# Patient Record
Sex: Female | Born: 1950 | Race: White | Hispanic: No | Marital: Married | Smoking: Former smoker
Health system: Southern US, Community
[De-identification: ages and names within clinical notes are randomized; demographics above are authoritative.]

## PROBLEM LIST (undated history)

## (undated) DIAGNOSIS — N393 Stress incontinence (female) (male): Secondary | ICD-10-CM

## (undated) DIAGNOSIS — I209 Angina pectoris, unspecified: Secondary | ICD-10-CM

## (undated) DIAGNOSIS — F419 Anxiety disorder, unspecified: Secondary | ICD-10-CM

## (undated) DIAGNOSIS — K5903 Drug induced constipation: Secondary | ICD-10-CM

## (undated) DIAGNOSIS — K219 Gastro-esophageal reflux disease without esophagitis: Secondary | ICD-10-CM

## (undated) DIAGNOSIS — T402X5A Adverse effect of other opioids, initial encounter: Secondary | ICD-10-CM

## (undated) DIAGNOSIS — C801 Malignant (primary) neoplasm, unspecified: Secondary | ICD-10-CM

## (undated) DIAGNOSIS — M419 Scoliosis, unspecified: Secondary | ICD-10-CM

## (undated) DIAGNOSIS — T8859XA Other complications of anesthesia, initial encounter: Secondary | ICD-10-CM

## (undated) DIAGNOSIS — M8580 Other specified disorders of bone density and structure, unspecified site: Secondary | ICD-10-CM

## (undated) DIAGNOSIS — J189 Pneumonia, unspecified organism: Secondary | ICD-10-CM

## (undated) DIAGNOSIS — M169 Osteoarthritis of hip, unspecified: Secondary | ICD-10-CM

## (undated) DIAGNOSIS — R55 Syncope and collapse: Secondary | ICD-10-CM

## (undated) DIAGNOSIS — R519 Headache, unspecified: Secondary | ICD-10-CM

## (undated) DIAGNOSIS — T4145XA Adverse effect of unspecified anesthetic, initial encounter: Secondary | ICD-10-CM

## (undated) DIAGNOSIS — G2581 Restless legs syndrome: Secondary | ICD-10-CM

## (undated) HISTORY — PX: TONSILLECTOMY: SUR1361

## (undated) HISTORY — PX: BACK SURGERY: SHX140

## (undated) HISTORY — PX: EYE SURGERY: SHX253

## (undated) HISTORY — PX: TUBAL LIGATION: SHX77

## (undated) HISTORY — PX: FOOT SURGERY: SHX648

## (undated) HISTORY — PX: BREAST SURGERY: SHX581

## (undated) HISTORY — PX: HYSTEROTOMY: SHX1776

## (undated) HISTORY — PX: ROTATOR CUFF REPAIR: SHX139

---
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
## 1986-10-08 HISTORY — PX: BREAST CYST ASPIRATION: SHX578

## 1988-10-08 HISTORY — PX: BREAST EXCISIONAL BIOPSY: SUR124

## 2004-07-27 ENCOUNTER — Ambulatory Visit: Payer: Self-pay

## 2004-10-08 HISTORY — PX: LAMINECTOMY: SHX219

## 2005-01-09 ENCOUNTER — Ambulatory Visit: Payer: Self-pay | Admitting: General Surgery

## 2005-02-06 ENCOUNTER — Ambulatory Visit: Payer: Self-pay | Admitting: Psychiatry

## 2005-02-25 ENCOUNTER — Other Ambulatory Visit: Payer: Self-pay

## 2005-02-25 ENCOUNTER — Emergency Department: Payer: Self-pay | Admitting: Emergency Medicine

## 2005-11-06 ENCOUNTER — Ambulatory Visit: Payer: Self-pay | Admitting: Pain Medicine

## 2006-01-10 ENCOUNTER — Ambulatory Visit: Payer: Self-pay | Admitting: General Surgery

## 2006-03-02 ENCOUNTER — Ambulatory Visit: Payer: Self-pay | Admitting: Psychiatry

## 2006-08-28 ENCOUNTER — Ambulatory Visit (HOSPITAL_COMMUNITY): Admission: RE | Admit: 2006-08-28 | Discharge: 2006-08-28 | Payer: Self-pay | Admitting: *Deleted

## 2007-06-18 ENCOUNTER — Encounter: Payer: Self-pay | Admitting: Obstetrics and Gynecology

## 2007-07-09 ENCOUNTER — Encounter: Payer: Self-pay | Admitting: Obstetrics and Gynecology

## 2007-08-24 ENCOUNTER — Encounter: Admission: RE | Admit: 2007-08-24 | Discharge: 2007-08-24 | Payer: Self-pay | Admitting: Orthopedic Surgery

## 2007-09-19 ENCOUNTER — Inpatient Hospital Stay (HOSPITAL_COMMUNITY): Admission: RE | Admit: 2007-09-19 | Discharge: 2007-09-20 | Payer: Self-pay | Admitting: Orthopedic Surgery

## 2008-07-14 LAB — HM PAP SMEAR: HM Pap smear: NEGATIVE

## 2008-07-28 ENCOUNTER — Ambulatory Visit: Payer: Self-pay | Admitting: Family Medicine

## 2008-10-04 ENCOUNTER — Ambulatory Visit: Payer: Self-pay | Admitting: Family Medicine

## 2008-11-24 ENCOUNTER — Ambulatory Visit: Payer: Self-pay | Admitting: Gastroenterology

## 2009-07-08 ENCOUNTER — Ambulatory Visit: Payer: Self-pay | Admitting: Family Medicine

## 2009-08-03 ENCOUNTER — Ambulatory Visit: Payer: Self-pay | Admitting: Gastroenterology

## 2010-06-16 ENCOUNTER — Ambulatory Visit: Payer: Self-pay | Admitting: Obstetrics and Gynecology

## 2010-06-20 ENCOUNTER — Ambulatory Visit: Payer: Self-pay | Admitting: Obstetrics and Gynecology

## 2011-02-20 NOTE — Op Note (Signed)
Elizabeth [name], Elizabeth [name]  ACCOUNT NO.:  [id number]   MEDICAL RECORD NO.:  [id number]          PATIENT TYPE:  OIB   LOCATION:  0098                         FACILITY:  [facility]   PHYSICIAN:  Marlowe Kays, M.D.  DATE OF BIRTH:  [birth year]   DATE OF PROCEDURE:  09/18/2007  DATE OF DISCHARGE:                               OPERATIVE REPORT   PREOPERATIVE DIAGNOSES:  1. Lumbar scoliosis.  2. Spinal stenosis primarily at L3-4 secondary to #1.  3. Facet degenerative arthritis.  4. Ligamentum flavum hypertrophy.   POSTOPERATIVE DIAGNOSES:  1. Lumbar scoliosis.  2. Spinal stenosis primarily at L3-4 secondary to #1.  3. Facet degenerative arthritis.  4. Ligamentum flavum hypertrophy.   OPERATION:  Central foraminal decompression L3-4 and L4-5.   SURGEON:  Marlowe Kays, M.D.   ASSISTANT:  Georges Lynch. Darrelyn Hillock, M.D.   ANESTHESIA:  General anesthesia, Burnett Corrente, M.D.   JUSTIFICATION FOR PROCEDURE:  She had symptoms of spinal stenosis with  bilateral pain and numbness.  An MRI performed on August 24, 2007,  showed facet degenerative changes at multiple levels with the only  operable area being at L3-4 extending somewhat proximal and somewhat  distal.  See operative description below for additional details.   DESCRIPTION OF PROCEDURE:  Prophylactic antibiotics, general anesthesia,  knee-chest position on the Andrews frame, back was prepped with DuraPrep  and with two spinal needles and while x-ray obtained, we located the L3-  4 interspace.  I then continued draping the back into sterile field.  Ioban employed.  Vertical midline incision based on the initial  incision.  The two spinous processes in the depths of the wound were  tagged with Kocher clamps and a second lateral x-ray demonstrated these  as being on L2 and L3. Based on the preoperative MRI, there appeared to  be stenosis somewhat proximal and somewhat distal to the L3-4 space.  Accordingly, I extended my  incision caudad and we exposed the neural  arches of lower part of L2 and completely of L3 and L4.  Self-retaining  McCullough retractors were placed.  With double action rongeurs, I  removed the spinous process of L3, a portion of L2 and a portion of L4.  I eventually removed the entire spinous process and along with this,  removed most of the neural arch of L3, L4, slightly inferior portion of  L2.  I then began completing the decompression with 2 and 3 mm Kerrison  rongeurs and double action rongeur, eventually bringing in the  microscope to perform the more detailed parts of the decompression.  We  decompressed thoroughly both cephalad and caudad until the central canal  was widely patent to  hockey stick.  A final localizing x-ray indicated  that we worked up to the mid body of L3 and the mid body of L5 distally  for a two level decompression to achieve the desired decompression.  Foramina were checked with a hockey stick.  Wound was irrigated with  sterile saline.  Gelfoam was placed and thrombin was placed over the  dura.  Self-retaining retractors were removed.  There was no unusual  bleeding.  I then closed the wound in layers  with interrupted #1 Vicryl  in the fascia and deep muscle as well as the deep subcutaneous tissue, 2-  0 Vicryl superficial subcutaneous tissue, staples in the skin, Betadine,  Adaptic, dry sterile dressing were applied.  She was gently placed in  PACU bed and taken in satisfactory condition with no known  complications.   ESTIMATED BLOOD LOSS:  No more than 200 mL.   BLOOD REPLACED:  None.           ______________________________  Marlowe Kays, M.D.     JA/MEDQ  D:  09/18/2007  T:  09/19/2007  Job:  [id number]

## 2011-02-20 NOTE — Consult Note (Signed)
[name], [name]  ACCOUNT NO.:  [id number]   MEDICAL RECORD NO.:  [id number]          PATIENT TYPE:  INP   LOCATION:  1237                         FACILITY:  [facility]   PHYSICIAN:  Elmore Guise., M.D.DATE OF BIRTH:  May 11, 1951   DATE OF CONSULTATION:  09/19/2007  DATE OF DISCHARGE:                                 CONSULTATION   REASON FOR CONSULTATION:  Chest pain.   HISTORY OF PRESENT ILLNESS:  Ms. Elizabeth [name] is a very pleasant 60-  year-old white female with past medical history of recurrent chest pain  (status post cardiac catheterization November 2007 which showed no  significant obstructive disease but mild vasospasm of the right coronary  artery, this resolved with intracoronary nitroglycerin),  gastroesophageal reflux disease, migraine headaches and palpitations who  was admitted for back surgery.  She underwent lumbar decompression  procedure yesterday.  She tolerated the procedure well.  Early this  morning the patient awoke with chest heaviness and shortness of breath.  She was given nitroglycerin with resolution.  Currently she was without  complaint except feeling achy in her lower back.  She denies any current  shortness of breath.  No orthopnea.  No PND and no palpitations.  She  has been using her morphine PCA pump by her report every 4 hours with  what seems to be pretty good pain relief.  Prior to her procedure she  had not used any nitroglycerin since September of this year.  Normally  she is pretty active at home without any significant problems.   HOME MEDICATIONS:  1. Home medications include Nexium 40 mg daily.  2. Aspirin 325 mg daily.  3. Klonopin 2 mg daily at bedtime.  4. Nitroglycerin p.r.n.  5. Ranexa p.r.n.  6. Mirapex.   HOSPITAL MEDICATIONS:  1. Lexapro.  2. Aspirin.  3. Topamax.  4. Xanax.  5. Morphine.  6. Protonix.   ALLERGIES:  PENICILLIN and NAPROSYN.   FAMILY HISTORY:  Positive for stroke and lung  cancer.   SOCIAL HISTORY:  She is married.  No current tobacco use.  Quit over 10  years ago.  Drinks wine socially.   PHYSICAL EXAMINATION:  VITAL SIGNS:  She is afebrile.  Blood pressure is  100/50.  Heart rate 70's showing sinus rhythm.  Sat 98% on 2 liters  nasal cannula.  GENERAL:  In general she is a very pleasant middle-aged white female  alert and oriented x4 in no acute distress.  HEENT:  Appear normal.  NECK:  Supple.  No lymphadenopathy.  Carotids 2+.  No JVD.  No bruits.  LUNGS:  Clear.  HEART:  Regular with no significant murmurs, gallops or rubs.  ABDOMEN:  Soft, nontender, nondistended.  No rebound or guarding.  EXTREMITIES:  Warm with 2+ pulses and no significant edema.  She does  have her SCDs on.   LAB:  Her lab work shows a CPK of 354 and 356, an MB of 3.8 and 3.0 and  troponin I of 0.06 and 0.04.  Her last hemoglobin done yesterday was  13.3 and her EKG #1 showed sinus bradycardia, rate of 50 per minute with  no ST or T wave  changes.  EKG #2 showed normal sinus rhythm, 78 per  minute with no significant ST or T wave changes.  These had no  significant changes from her prior tracing done in the office.   IMPRESSION:  1. Chest pain with history of nonobstructed coronary arteries and      vasospasm noted.  The patient is currently chest pain-free.  2. Status post lumbar decompression surgery.  3. Gastroesophageal reflux disease.   PLAN:  1. At this time I agree with her current management.  I will continue      aspirin 325 mg daily.  She is currently chest pain-free and her      enzymes and EKGs are negative.  I would use Toradol as needed for      her pain; if this does not improve her symptoms then p.r.n.      nitroglycerin.  Will continue telemetry monitoring for 24 hours and      if no significant problems today I do feel like she would likely be      stable for discharge in the morning from a cardiology standpoint.      I will alert my partner who will  round over the weekend.  Please      call if any further cardiac questions arise.      Elmore Guise., M.D.  Electronically Signed     TWK/MEDQ  D:  09/19/2007  T:  09/19/2007  Job:  [id number]

## 2011-02-23 NOTE — Cardiovascular Report (Signed)
NAMECARMITA, BOOM          ACCOUNT NO.:  [id number]   MEDICAL RECORD NO.:  [id number]          PATIENT TYPE:  OIB   LOCATION:  2899                         FACILITY:  [facility]   PHYSICIAN:  Elmore Guise., M.D.DATE OF BIRTH:  April 02, 1951   DATE OF PROCEDURE:  08/28/2006  DATE OF DISCHARGE:                            CARDIAC CATHETERIZATION   INDICATIONS FOR PROCEDURE:  A 60 year old white female with recent ER  evaluation for chest pain and mild inferior ECG changes.   DESCRIPTION OF PROCEDURE:  The patient was brought to the cardiac  catheterization lab after appropriate full consent.  She was prepped and  draped in sterile fashion.  Approximately 15 mL of 1% lidocaine was used  for local anesthesia.  A 6-French sheath was placed in the right femoral  artery without difficulty.  Coronary angiography, LV angiography,  limited right femoral angiography were then performed.  Angio-Seal  closure device was deployed successfully.  The patient tolerated the  procedure well was transferred from the cardiac catheterization lab in  stable condition.   FINDINGS:  1. Left Main:  Short and normal appearing  2. LAD:  Mild luminal irregularities.  3. Diagonal-1:  Small vessel, mild luminal irregularities.  4. Left circumflex:  Nondominant, mild luminal irregularities.  5. OM-1:  Small to moderate-size vessel with mild luminal      irregularities.  6. Ramus intermedius:  Small vessel with mild luminal irregularities.  7. RCA is a small to moderate-size dominant vessel with mild luminal      irregularities. Proximal vasospasm noted on engagement with the      catheter. Catheter was changed out for a 4-French JR-4; 200 mcg of      intracoronary nitroglycerin was given with resolution of her      vasospasm.  8. LV:  EF is 55-60%.  No wall motion abnormalities.  LVEDP is 14      mmHg.  9. Limited right femoral angiogram was performed which showed no      significant atherosclerotic  disease and normal appropriate      arteriotomy site for Angio-Seal deployment.  AngioSeal was deployed      and successful.   IMPRESSION:  1. Nonobstructive coronary arteries as described.  2. Mild proximal vasospasm of her right coronary artery (resolved with      intracoronary nitroglycerin).  3. Normal left ventricular systolic function with an ejection fraction      of 55-60%.   PLAN:  At the current time, we will continue to treat with aggressive  risk factor modification.  I did discuss complete smoking cessation with  her at length.  We will continue her long-acting nitroglycerin for spasm  component.  The patient will follow up in approximately 2 weeks.     Elmore Guise., M.D.  Electronically Signed    TWK/MEDQ  D:  08/28/2006  T:  08/28/2006  Job:  [id number]

## 2011-02-23 NOTE — H&P (Signed)
Elizabeth [name], Elizabeth [name]  ACCOUNT NO.:  [id number]   MEDICAL RECORD NO.:  [id number]           PATIENT TYPE:   LOCATION:                                 FACILITY:   PHYSICIAN:  Elmore Guise., M.D.DATE OF BIRTH:  12/06/1950   DATE OF ADMISSION:  08/28/2006  DATE OF DISCHARGE:                              HISTORY & PHYSICAL   INDICATION FOR EVALUATION:  Increased exertional chest pain with ER  admission last week.   HISTORY OF PRESENT ILLNESS:  Elizabeth [name] is a very pleasant 60-  year-old white female with past medical history of premenstrual  syndrome, gastroesophageal reflux, migraine headaches, and syncope who  presents for evaluation of chest pain. The patient reports episode of  chest pain [place]. She stated it  started as a pressure in her substernal area radiating to her neck  associated with some shortness of breath. EMS was notified. She was  taken to the local emergency department in [place]. There she had a  normal EKG with some nonspecific inferior ST/T wave flattening. She was  treated with nitroglycerin. Had one set of cardiac enzymes performed  which were negative. Since that time she has had an occasional episode  of twinge. Seems to happen when she is up and active. She has also  experienced some orthopnea which is unusual for her. She denies any  cough, no significant shortness of breath. She does report one other  episode of angina this week.   PAST CARDIAC HISTORY:  Evaluation for vasovagal syncope. Her workup at  that time included an echocardiogram which showed an EF of 55-60% with  no wall motion abnormalities, normal diastolic function, and no  significant valvular heart disease. She has also had a stress Cardiolite  dated Mar 06, 2005 where she exercised via Bruce protocol for 9 minutes  and 30 seconds achieving a peak heart rate of 160 beats per minute  corresponding to 95% of age predicted  maximum. She had no EKG changes  and normal perfusion. She had normal LV function with her stress test  with an EF calculated at 75%.   REVIEW OF SYSTEMS:  Negative. She did have difficulty with her Imdur and  unable to tolerate this because of headaches.   CURRENT MEDICATIONS:  1. Nexium 40 mg daily.  2. Aspirin 325 mg daily.  3. Multivitamin once daily.  4. Klonopin 0.5 mg in the morning and 1 mg in the evening.  5. Nitroglycerin p.r.n.  6. Lexapro 10 mg daily.  7. Maxalt 10 mg p.r.n.  8. MiraLax once daily.   ALLERGIES:  PENICILLIN, NAPROSYN.   FAMILY HISTORY:  Positive for stroke and prostate cancer in her father,  lung cancer in her mother.   SOCIAL HISTORY:  She is married. She does work at the [employer]. She  used to exercise for 30-40 minutes at a time. However, has been unable  to do this the last couple of weeks. She has approximately 30 pack-year  history of tobacco. Had quit for almost 10 years. However, now is back  smoking again.   PAST SURGICAL HISTORY:  Appendectomy, tonsillectomy, two  miscarriages  with D&Cs, bilateral rotator cuff surgery, tubal ligation, and right  breast surgery.   PHYSICAL EXAMINATION:  VITAL SIGNS:  Weight 181 pounds, blood pressure  110/80, heart rate 60 and regular.  GENERAL:  She is a very pleasant middle-aged white female alert and  oriented x4. No acute distress.  HEENT: Appeared normal.  NECK:  Supple. No lymphadenopathy, 2+ carotids. No JVD, no bruits.  LUNGS:  Clear.  HEART:  Regular with a normal S1, S2. No significant murmur, gallops or  rubs.  ABDOMEN:  Soft, nontender, nondistended. No rebound or guarding.  EXTREMITIES:  Femoral pulses 2+ with 2+ pedal pulses noted. No  significant edema.   LABORATORY DATA:  Her EKG from [facility] showed normal sinus  rhythm, normal axis, normal intervals with nonspecific inferior ST-T  wave changes which are new compared to her EKG done at her stress test  dated May30,  2006.   Her blood work at that time showed sodium 140, potassium  3.8, BUN and  creatinine of 13 and 0.7, normal LFTs. She had a CPK 43 and MB of less  than 1.2. Troponin I less than 0.04. White count was 10.6, hemoglobin  13.1, platelet count 346.   Her chest x-ray showed no acute cardiopulmonary disease, and her O2 sat  today during the office visit was 97%.   IMPRESSION:  1. Exertional chest pain.  2. Nonspecific inferior ST-T wave changes.  3. Resumption of tobacco dependence.   PLAN:  At current time I discussed with her symptoms and nonspecific  inferior ST changes. I would recommend invasive evaluation. She will be  set up for a cardiac catheterization with possible intervention. I do  want to put her on Ranexa 500 mg once daily. She is to use nitroglycerin  on a p.r.n. basis. I did discuss catheterization with her, and she  understands the risks and benefits. Her husband has undergone two of  these prior. She will continue her aspirin. We will check a D-dimer  today as well as a PT/INR. Her procedure is scheduled for August 28, 2006.      Elmore Guise., M.D.  Electronically Signed     TWK/MEDQ  D:  08/26/2006  T:  08/26/2006  Job:  [id number]

## 2011-07-16 LAB — CARDIAC PANEL(CRET KIN+CKTOT+MB+TROPI)
CK, MB: 3
Relative Index: 1.1
Total CK: 356 — ABNORMAL HIGH
Troponin I: 0.06

## 2011-07-16 LAB — HEMOGLOBIN AND HEMATOCRIT, BLOOD: HCT: 38.3

## 2011-09-19 ENCOUNTER — Ambulatory Visit: Payer: Self-pay | Admitting: Family Medicine

## 2012-09-24 ENCOUNTER — Ambulatory Visit: Payer: Self-pay | Admitting: Obstetrics and Gynecology

## 2013-05-05 LAB — HM COLONOSCOPY

## 2013-05-29 ENCOUNTER — Ambulatory Visit: Payer: Self-pay | Admitting: Adult Health

## 2013-08-27 ENCOUNTER — Ambulatory Visit: Payer: Self-pay | Admitting: Family Medicine

## 2013-09-25 ENCOUNTER — Ambulatory Visit: Payer: Self-pay | Admitting: Obstetrics and Gynecology

## 2013-10-05 DIAGNOSIS — M25512 Pain in left shoulder: Secondary | ICD-10-CM | POA: Insufficient documentation

## 2014-03-26 ENCOUNTER — Emergency Department: Payer: Self-pay | Admitting: Emergency Medicine

## 2014-04-08 ENCOUNTER — Ambulatory Visit: Payer: Self-pay | Admitting: Family Medicine

## 2014-08-30 ENCOUNTER — Ambulatory Visit: Payer: Self-pay | Admitting: Obstetrics and Gynecology

## 2014-09-27 ENCOUNTER — Ambulatory Visit: Payer: Self-pay | Admitting: Obstetrics and Gynecology

## 2014-09-27 LAB — HM MAMMOGRAPHY

## 2014-09-28 ENCOUNTER — Emergency Department: Payer: Self-pay | Admitting: Emergency Medicine

## 2014-09-28 LAB — LIPID PANEL
CHOLESTEROL: 202 mg/dL — AB (ref 0–200)
HDL: 62 mg/dL (ref 35–70)
LDL CALC: 109 mg/dL
Triglycerides: 155 mg/dL (ref 40–160)

## 2014-09-28 LAB — BASIC METABOLIC PANEL
BUN: 11 mg/dL (ref 4–21)
CREATININE: 0.7 mg/dL (ref 0.5–1.1)
GLUCOSE: 91 mg/dL
SODIUM: 141 mmol/L (ref 137–147)

## 2014-09-28 LAB — CBC AND DIFFERENTIAL: WBC: 8.6 10*3/mL

## 2014-09-28 LAB — TSH: TSH: 1.8 u[IU]/mL (ref 0.41–5.90)

## 2014-09-28 LAB — HEPATIC FUNCTION PANEL
ALT: 15 U/L (ref 7–35)
AST: 17 U/L (ref 13–35)

## 2014-12-20 NOTE — H&P (Signed)
TOTAL HIP ADMISSION H&P  Patient is admitted for right total hip arthroplasty.  Subjective:  Chief Complaint: right hip pain  HPI: Elizabeth [name], 64 y.o. female, has a history of pain and functional disability in the right hip(s) due to arthritis and patient has failed non-surgical conservative treatments for greater than 12 weeks to include NSAID's and/or analgesics, corticosteriod injections and activity modification.  Onset of symptoms was gradual starting 5 years ago with gradually worsening course since that time.The patient noted no past surgery on the right hip(s).  Patient currently rates pain in the right hip at 8 out of 10 with activity. Patient has night pain, worsening of pain with activity and weight bearing, pain that interfers with activities of daily living, pain with passive range of motion and crepitus. Patient has evidence of joint space narrowing by imaging studies. This condition presents safety issues increasing the risk of falls.  There is no current active infection.  There are no active problems to display for this patient.  No past medical history on file.  No past surgical history on file.  No prescriptions prior to admission   Allergies not on file  History  Substance Use Topics  . Smoking status: Not on file  . Smokeless tobacco: Not on file  . Alcohol Use: Not on file    No family history on file.   Review of Systems  Constitutional: Negative for fever and chills.  HENT: Negative for ear pain and sore throat.   Eyes: Negative for blurred vision and double vision.  Respiratory: Negative for cough and wheezing.   Cardiovascular: Negative for chest pain and palpitations.  Gastrointestinal: Negative for nausea and vomiting.  Musculoskeletal: Positive for joint pain (right hip).  Skin: Negative for rash.  Neurological: Negative for dizziness and seizures.  Psychiatric/Behavioral: Negative for depression and suicidal ideas.     Objective:  Physical Exam  Constitutional: She is oriented to person, place, and time. She appears well-developed and well-nourished.  HENT:  Head: Normocephalic and atraumatic.  Eyes: Conjunctivae and EOM are normal. Pupils are equal, round, and reactive to light.  Neck: Normal range of motion. Neck supple.  Cardiovascular: Normal rate and intact distal pulses.   Respiratory: Effort normal and breath sounds normal.  GI: Soft. Bowel sounds are normal.  Musculoskeletal: She exhibits tenderness (right hip).  Decreased ROM by 50% due to pain  Neurological: She is alert and oriented to person, place, and time.  Skin: Skin is warm and dry.  Psychiatric: She has a normal mood and affect. Her behavior is normal. Judgment and thought content normal.    Vital signs in last 24 hours:    Labs:   There is no height or weight on file to calculate BMI.   Imaging Review Plain radiographs demonstrate severe degenerative joint disease of the right hip(s). The bone quality appears to be fair for age and reported activity level.  Assessment/Plan:  End stage arthritis, right hip(s)  The patient history, physical examination, clinical judgement of the provider and imaging studies are consistent with end stage degenerative joint disease of the right hip(s) and total hip arthroplasty is deemed medically necessary. The treatment options including medical management, injection therapy, arthroscopy and arthroplasty were discussed at length. The risks and benefits of total hip arthroplasty were presented and reviewed. The risks due to aseptic loosening, infection, stiffness, dislocation/subluxation,  thromboembolic complications and other imponderables were discussed.  The patient acknowledged the explanation, agreed to proceed with the plan and consent  was signed. Patient is being admitted for inpatient treatment for surgery, pain control, PT, OT, prophylactic antibiotics, VTE prophylaxis, progressive  ambulation and ADL's and discharge planning.The patient is planning to be discharged home with home health services

## 2015-01-07 ENCOUNTER — Encounter (HOSPITAL_COMMUNITY)
Admission: RE | Admit: 2015-01-07 | Discharge: 2015-01-07 | Disposition: A | Discharge status: Home / Self Care | Payer: Federal, State, Local not specified - PPO | Source: Ambulatory Visit | Attending: Orthopedic Surgery | Admitting: Orthopedic Surgery

## 2015-01-07 ENCOUNTER — Other Ambulatory Visit (HOSPITAL_COMMUNITY): Payer: Self-pay | Admitting: *Deleted

## 2015-01-07 ENCOUNTER — Encounter (HOSPITAL_COMMUNITY): Payer: Self-pay

## 2015-01-07 DIAGNOSIS — Z01812 Encounter for preprocedural laboratory examination: Secondary | ICD-10-CM | POA: Diagnosis present

## 2015-01-07 HISTORY — DX: Drug induced constipation: K59.03

## 2015-01-07 HISTORY — DX: Gastro-esophageal reflux disease without esophagitis: K21.9

## 2015-01-07 HISTORY — DX: Other specified disorders of bone density and structure, unspecified site: M85.80

## 2015-01-07 HISTORY — DX: Adverse effect of other opioids, initial encounter: T40.2X5A

## 2015-01-07 HISTORY — DX: Osteoarthritis of hip, unspecified: M16.9

## 2015-01-07 HISTORY — DX: Stress incontinence (female) (male): N39.3

## 2015-01-07 HISTORY — DX: Syncope and collapse: R55

## 2015-01-07 HISTORY — DX: Restless legs syndrome: G25.81

## 2015-01-07 LAB — BASIC METABOLIC PANEL
ANION GAP: 4 — AB (ref 5–15)
BUN: 11 mg/dL (ref 6–23)
CALCIUM: 9.4 mg/dL (ref 8.4–10.5)
CO2: 26 mmol/L (ref 19–32)
CREATININE: 0.65 mg/dL (ref 0.50–1.10)
Chloride: 108 mmol/L (ref 96–112)
Glucose, Bld: 71 mg/dL (ref 70–99)
Potassium: 4.1 mmol/L (ref 3.5–5.1)
Sodium: 138 mmol/L (ref 135–145)

## 2015-01-07 LAB — ABO/RH: ABO/RH(D): O POS

## 2015-01-07 LAB — CBC
HCT: 43.8 % (ref 36.0–46.0)
Hemoglobin: 14.2 g/dL (ref 12.0–15.0)
MCH: 29.8 pg (ref 26.0–34.0)
MCHC: 32.4 g/dL (ref 30.0–36.0)
MCV: 91.8 fL (ref 78.0–100.0)
Platelets: 276 10*3/uL (ref 150–400)
RBC: 4.77 MIL/uL (ref 3.87–5.11)
RDW: 13.8 % (ref 11.5–15.5)
WBC: 8.2 10*3/uL (ref 4.0–10.5)

## 2015-01-07 LAB — URINALYSIS, ROUTINE W REFLEX MICROSCOPIC
BILIRUBIN URINE: NEGATIVE
Glucose, UA: NEGATIVE mg/dL
HGB URINE DIPSTICK: NEGATIVE
Ketones, ur: NEGATIVE mg/dL
LEUKOCYTES UA: NEGATIVE
NITRITE: NEGATIVE
Protein, ur: NEGATIVE mg/dL
SPECIFIC GRAVITY, URINE: 1.022 (ref 1.005–1.030)
Urobilinogen, UA: 1 mg/dL (ref 0.0–1.0)
pH: 5.5 (ref 5.0–8.0)

## 2015-01-07 LAB — SURGICAL PCR SCREEN
MRSA, PCR: NEGATIVE
STAPHYLOCOCCUS AUREUS: NEGATIVE

## 2015-01-07 LAB — TYPE AND SCREEN
ABO/RH(D): O POS
ANTIBODY SCREEN: NEGATIVE

## 2015-01-07 LAB — PROTIME-INR
INR: 0.96 (ref 0.00–1.49)
Prothrombin Time: 12.8 seconds (ref 11.6–15.2)

## 2015-01-07 NOTE — Pre-Procedure Instructions (Signed)
Elizabeth [name]  01/07/2015   Your procedure is scheduled on:  Tuesday, January 18, 2015 at 12:00 PM.   Report to [facility] Entrance "A" Admitting Office at 10:00 AM.   Call this number if you have problems the morning of surgery: [phone]               Any questions prior to day of surgery, please call [phone] between 8 & 4 PM.   Remember:   Do not eat food or drink liquids after midnight Monday, 01/17/15.   Take these medicines the morning of surgery with A SIP OF WATER: Omeprazole (Prilosec), Hydrocodone (Vicodin) - if needed.  Stop Vitamins 7 days prior to surgery.   Do not wear jewelry, make-up or nail polish.  Do not wear lotions, powders, or perfumes. You may wear deodorant.  Do not shave 48 hours prior to surgery.   Do not bring valuables to the hospital.  [facility] is not responsible                  for any belongings or valuables.               Contacts, dentures or bridgework may not be worn into surgery.  Leave suitcase in the car. After surgery it may be brought to your room.  For patients admitted to the hospital, discharge time is determined by your                treatment team.               Special Instructions: See "Preparing for Surgery" Instruction sheet.    Please read over the following fact sheets that you were given: Pain Booklet, Coughing and Deep Breathing, Blood Transfusion Information, MRSA Information and Surgical Site Infection Prevention

## 2015-01-08 LAB — URINE CULTURE: Colony Count: 75000

## 2015-01-17 MED ORDER — TRANEXAMIC ACID 100 MG/ML IV SOLN
2000.0000 mg | INTRAVENOUS | Status: AC
  Administered 2015-01-18: 2000 mg via TOPICAL
  Filled 2015-01-17: qty 20

## 2015-01-17 MED ORDER — CHLORHEXIDINE GLUCONATE 4 % EX LIQD
60.0000 mL | Freq: Once | CUTANEOUS | Status: DC
  Filled 2015-01-17: qty 60

## 2015-01-17 MED ORDER — ACETAMINOPHEN 500 MG PO TABS: 1000.0000 mg | ORAL_TABLET | Freq: Once | ORAL | Status: DC

## 2015-01-17 MED ORDER — POTASSIUM CHLORIDE IN NACL 20-0.45 MEQ/L-% IV SOLN
INTRAVENOUS | Status: DC
  Filled 2015-01-17 (×3): qty 1000

## 2015-01-17 MED ORDER — CEFAZOLIN SODIUM-DEXTROSE 2-3 GM-% IV SOLR
2.0000 g | INTRAVENOUS | Status: AC
  Administered 2015-01-18: 2 g via INTRAVENOUS
  Filled 2015-01-17: qty 50

## 2015-01-17 NOTE — Progress Notes (Signed)
Pt. Notified of time change for surgery. To arrive @ 0530. Pt. Voices understanding.

## 2015-01-18 ENCOUNTER — Inpatient Hospital Stay (HOSPITAL_COMMUNITY): Payer: Federal, State, Local not specified - PPO

## 2015-01-18 ENCOUNTER — Encounter (HOSPITAL_COMMUNITY)
Admission: RE | Disposition: A | Discharge status: Home / Self Care | Payer: Self-pay | Source: Ambulatory Visit | Attending: Orthopedic Surgery

## 2015-01-18 ENCOUNTER — Encounter (HOSPITAL_COMMUNITY): Payer: Self-pay | Admitting: *Deleted

## 2015-01-18 ENCOUNTER — Inpatient Hospital Stay (HOSPITAL_COMMUNITY): Payer: Federal, State, Local not specified - PPO | Admitting: Anesthesiology

## 2015-01-18 ENCOUNTER — Inpatient Hospital Stay (HOSPITAL_COMMUNITY)
Admission: RE | Admit: 2015-01-18 | Discharge: 2015-01-19 | DRG: 470 | Disposition: A | Discharge status: Home / Self Care | Payer: Federal, State, Local not specified - PPO | Source: Ambulatory Visit | Attending: Orthopedic Surgery | Admitting: Orthopedic Surgery

## 2015-01-18 DIAGNOSIS — G2581 Restless legs syndrome: Secondary | ICD-10-CM | POA: Diagnosis present

## 2015-01-18 DIAGNOSIS — M8580 Other specified disorders of bone density and structure, unspecified site: Secondary | ICD-10-CM | POA: Diagnosis present

## 2015-01-18 DIAGNOSIS — M1611 Unilateral primary osteoarthritis, right hip: Secondary | ICD-10-CM | POA: Diagnosis present

## 2015-01-18 DIAGNOSIS — M1990 Unspecified osteoarthritis, unspecified site: Secondary | ICD-10-CM | POA: Diagnosis present

## 2015-01-18 DIAGNOSIS — K219 Gastro-esophageal reflux disease without esophagitis: Secondary | ICD-10-CM | POA: Diagnosis present

## 2015-01-18 DIAGNOSIS — M25551 Pain in right hip: Secondary | ICD-10-CM | POA: Diagnosis present

## 2015-01-18 DIAGNOSIS — Z96649 Presence of unspecified artificial hip joint: Secondary | ICD-10-CM

## 2015-01-18 HISTORY — PX: TOTAL HIP ARTHROPLASTY: SHX124

## 2015-01-18 SURGERY — ARTHROPLASTY, HIP, TOTAL, ANTERIOR APPROACH
Anesthesia: General | Site: Hip | Laterality: Right

## 2015-01-18 MED ORDER — MORPHINE SULFATE 2 MG/ML IJ SOLN
2.0000 mg | INTRAMUSCULAR | Status: DC | PRN
  Administered 2015-01-18 – 2015-01-19 (×5): 2 mg via INTRAVENOUS
  Filled 2015-01-18 (×5): qty 1

## 2015-01-18 MED ORDER — ROCURONIUM BROMIDE 100 MG/10ML IV SOLN
INTRAVENOUS | Status: DC | PRN
  Administered 2015-01-18: 35 mg via INTRAVENOUS
  Administered 2015-01-18: 15 mg via INTRAVENOUS

## 2015-01-18 MED ORDER — ASPIRIN 325 MG PO TABS: 325.0000 mg | ORAL_TABLET | Freq: Every day | ORAL | Status: DC

## 2015-01-18 MED ORDER — FENTANYL CITRATE 0.05 MG/ML IJ SOLN
INTRAMUSCULAR | Status: AC
  Filled 2015-01-18: qty 5

## 2015-01-18 MED ORDER — PROMETHAZINE HCL 25 MG/ML IJ SOLN: 6.2500 mg | INTRAMUSCULAR | Status: DC | PRN

## 2015-01-18 MED ORDER — DEXAMETHASONE SODIUM PHOSPHATE 10 MG/ML IJ SOLN
10.0000 mg | Freq: Once | INTRAMUSCULAR | Status: AC
  Administered 2015-01-19: 10 mg via INTRAVENOUS
  Filled 2015-01-18: qty 1

## 2015-01-18 MED ORDER — ONDANSETRON HCL 4 MG/2ML IJ SOLN: 4.0000 mg | Freq: Four times a day (QID) | INTRAMUSCULAR | Status: DC | PRN

## 2015-01-18 MED ORDER — ACETAMINOPHEN 10 MG/ML IV SOLN
1000.0000 mg | INTRAVENOUS | Status: AC
  Administered 2015-01-18: 1000 mg via INTRAVENOUS
  Filled 2015-01-18: qty 100

## 2015-01-18 MED ORDER — GLYCOPYRROLATE 0.2 MG/ML IJ SOLN
INTRAMUSCULAR | Status: DC | PRN
  Administered 2015-01-18: .8 mg via INTRAVENOUS
  Administered 2015-01-18: 0.2 mg via INTRAVENOUS

## 2015-01-18 MED ORDER — ASPIRIN EC 325 MG PO TBEC
325.0000 mg | DELAYED_RELEASE_TABLET | Freq: Every day | ORAL | Status: DC
  Administered 2015-01-19: 325 mg via ORAL
  Filled 2015-01-18 (×2): qty 1

## 2015-01-18 MED ORDER — ONDANSETRON HCL 4 MG PO TABS: 4.0000 mg | ORAL_TABLET | Freq: Four times a day (QID) | ORAL | Status: DC | PRN

## 2015-01-18 MED ORDER — MIDAZOLAM HCL 2 MG/2ML IJ SOLN
INTRAMUSCULAR | Status: AC
  Filled 2015-01-18: qty 2

## 2015-01-18 MED ORDER — MIDAZOLAM HCL 5 MG/5ML IJ SOLN
INTRAMUSCULAR | Status: DC | PRN
  Administered 2015-01-18: 2 mg via INTRAVENOUS

## 2015-01-18 MED ORDER — ACETAMINOPHEN 650 MG RE SUPP: 650.0000 mg | Freq: Four times a day (QID) | RECTAL | Status: DC | PRN

## 2015-01-18 MED ORDER — LACTATED RINGERS IV SOLN
INTRAVENOUS | Status: DC | PRN
  Administered 2015-01-18 (×2): via INTRAVENOUS

## 2015-01-18 MED ORDER — OXYCODONE HCL 5 MG PO TABS
5.0000 mg | ORAL_TABLET | Freq: Once | ORAL | Status: AC | PRN
  Administered 2015-01-18: 5 mg via ORAL

## 2015-01-18 MED ORDER — HYDROCODONE-ACETAMINOPHEN 5-325 MG PO TABS: 1.0000 | ORAL_TABLET | Freq: Four times a day (QID) | ORAL | Status: DC | PRN

## 2015-01-18 MED ORDER — PHENOL 1.4 % MT LIQD: 1.0000 | OROMUCOSAL | Status: DC | PRN

## 2015-01-18 MED ORDER — METOCLOPRAMIDE HCL 5 MG PO TABS: 5.0000 mg | ORAL_TABLET | Freq: Three times a day (TID) | ORAL | Status: DC | PRN

## 2015-01-18 MED ORDER — ONDANSETRON HCL 4 MG PO TABS: 4.0000 mg | ORAL_TABLET | Freq: Three times a day (TID) | ORAL | Status: DC | PRN

## 2015-01-18 MED ORDER — ROCURONIUM BROMIDE 50 MG/5ML IV SOLN
INTRAVENOUS | Status: AC
  Filled 2015-01-18: qty 1

## 2015-01-18 MED ORDER — PROPOFOL 10 MG/ML IV BOLUS
INTRAVENOUS | Status: DC | PRN
  Administered 2015-01-18: 150 mg via INTRAVENOUS

## 2015-01-18 MED ORDER — DOCUSATE SODIUM 100 MG PO CAPS
100.0000 mg | ORAL_CAPSULE | Freq: Two times a day (BID) | ORAL | Status: DC
  Administered 2015-01-18 – 2015-01-19 (×3): 100 mg via ORAL
  Filled 2015-01-18 (×3): qty 1

## 2015-01-18 MED ORDER — NEOSTIGMINE METHYLSULFATE 10 MG/10ML IV SOLN
INTRAVENOUS | Status: DC | PRN
  Administered 2015-01-18: 5 mg via INTRAVENOUS

## 2015-01-18 MED ORDER — LIDOCAINE HCL (CARDIAC) 20 MG/ML IV SOLN
INTRAVENOUS | Status: AC
  Filled 2015-01-18: qty 5

## 2015-01-18 MED ORDER — CELECOXIB 100 MG PO CAPS: 100.0000 mg | ORAL_CAPSULE | Freq: Two times a day (BID) | ORAL | Status: DC

## 2015-01-18 MED ORDER — PANTOPRAZOLE SODIUM 40 MG PO TBEC
40.0000 mg | DELAYED_RELEASE_TABLET | Freq: Every day | ORAL | Status: DC
  Administered 2015-01-19: 40 mg via ORAL
  Filled 2015-01-18: qty 1

## 2015-01-18 MED ORDER — POTASSIUM CHLORIDE IN NACL 20-0.45 MEQ/L-% IV SOLN
INTRAVENOUS | Status: DC
  Administered 2015-01-18: 12:00:00 via INTRAVENOUS
  Filled 2015-01-18 (×4): qty 1000

## 2015-01-18 MED ORDER — ALPRAZOLAM 0.5 MG PO TABS
1.5000 mg | ORAL_TABLET | Freq: Every day | ORAL | Status: DC
  Administered 2015-01-18: 1.5 mg via ORAL
  Filled 2015-01-18: qty 3

## 2015-01-18 MED ORDER — PHENYLEPHRINE HCL 10 MG/ML IJ SOLN
INTRAMUSCULAR | Status: DC | PRN
  Administered 2015-01-18: 80 ug via INTRAVENOUS

## 2015-01-18 MED ORDER — OXYCODONE HCL 5 MG/5ML PO SOLN: 5.0000 mg | Freq: Once | ORAL | Status: AC | PRN

## 2015-01-18 MED ORDER — METOCLOPRAMIDE HCL 5 MG/ML IJ SOLN
5.0000 mg | Freq: Three times a day (TID) | INTRAMUSCULAR | Status: DC | PRN
  Administered 2015-01-18: 10 mg via INTRAVENOUS
  Filled 2015-01-18: qty 2

## 2015-01-18 MED ORDER — METHOCARBAMOL 500 MG PO TABS
500.0000 mg | ORAL_TABLET | Freq: Four times a day (QID) | ORAL | Status: DC | PRN
  Administered 2015-01-18 – 2015-01-19 (×3): 500 mg via ORAL
  Filled 2015-01-18 (×5): qty 1

## 2015-01-18 MED ORDER — PROPOFOL 10 MG/ML IV BOLUS
INTRAVENOUS | Status: AC
  Filled 2015-01-18: qty 20

## 2015-01-18 MED ORDER — EPHEDRINE SULFATE 50 MG/ML IJ SOLN
INTRAMUSCULAR | Status: DC | PRN
  Administered 2015-01-18: 10 mg via INTRAVENOUS

## 2015-01-18 MED ORDER — 0.9 % SODIUM CHLORIDE (POUR BTL) OPTIME
TOPICAL | Status: DC | PRN
  Administered 2015-01-18: 1000 mL

## 2015-01-18 MED ORDER — CEFAZOLIN SODIUM 1-5 GM-% IV SOLN
1.0000 g | Freq: Four times a day (QID) | INTRAVENOUS | Status: AC
  Administered 2015-01-18 (×2): 1 g via INTRAVENOUS
  Filled 2015-01-18 (×3): qty 50

## 2015-01-18 MED ORDER — PRAMIPEXOLE DIHYDROCHLORIDE 0.125 MG PO TABS
1.0000 mg | ORAL_TABLET | Freq: Every day | ORAL | Status: DC
  Administered 2015-01-18: 1 mg via ORAL
  Filled 2015-01-18: qty 8

## 2015-01-18 MED ORDER — ACETAMINOPHEN 325 MG PO TABS: 650.0000 mg | ORAL_TABLET | Freq: Four times a day (QID) | ORAL | Status: DC | PRN

## 2015-01-18 MED ORDER — HYDROMORPHONE HCL 1 MG/ML IJ SOLN
INTRAMUSCULAR | Status: AC
  Administered 2015-01-18: 0.5 mg via INTRAVENOUS
  Filled 2015-01-18: qty 1

## 2015-01-18 MED ORDER — DEXTROSE 5 % IV SOLN
500.0000 mg | Freq: Four times a day (QID) | INTRAVENOUS | Status: DC | PRN
  Filled 2015-01-18 (×2): qty 5

## 2015-01-18 MED ORDER — FENTANYL CITRATE 0.05 MG/ML IJ SOLN
INTRAMUSCULAR | Status: DC | PRN
  Administered 2015-01-18: 100 ug via INTRAVENOUS
  Administered 2015-01-18 (×2): 50 ug via INTRAVENOUS
  Administered 2015-01-18: 100 ug via INTRAVENOUS
  Administered 2015-01-18 (×4): 50 ug via INTRAVENOUS

## 2015-01-18 MED ORDER — CELECOXIB 200 MG PO CAPS: 200.0000 mg | ORAL_CAPSULE | Freq: Two times a day (BID) | ORAL | Status: DC

## 2015-01-18 MED ORDER — LIDOCAINE HCL (CARDIAC) 20 MG/ML IV SOLN
INTRAVENOUS | Status: DC | PRN
  Administered 2015-01-18: 60 mg via INTRAVENOUS

## 2015-01-18 MED ORDER — DOCUSATE SODIUM 100 MG PO CAPS: 100.0000 mg | ORAL_CAPSULE | Freq: Two times a day (BID) | ORAL | Status: DC

## 2015-01-18 MED ORDER — ONDANSETRON HCL 4 MG/2ML IJ SOLN
INTRAMUSCULAR | Status: DC | PRN
  Administered 2015-01-18: 4 mg via INTRAVENOUS

## 2015-01-18 MED ORDER — HYDROMORPHONE HCL 1 MG/ML IJ SOLN
0.2500 mg | INTRAMUSCULAR | Status: DC | PRN
  Administered 2015-01-18 (×4): 0.5 mg via INTRAVENOUS

## 2015-01-18 MED ORDER — MENTHOL 3 MG MT LOZG: 1.0000 | LOZENGE | OROMUCOSAL | Status: DC | PRN

## 2015-01-18 MED ORDER — HYDROCODONE-ACETAMINOPHEN 5-325 MG PO TABS
1.0000 | ORAL_TABLET | ORAL | Status: DC | PRN
  Administered 2015-01-18 – 2015-01-19 (×6): 2 via ORAL
  Filled 2015-01-18 (×6): qty 2

## 2015-01-18 MED ORDER — OXYCODONE HCL 5 MG PO TABS
ORAL_TABLET | ORAL | Status: AC
  Filled 2015-01-18: qty 1

## 2015-01-18 SURGICAL SUPPLY — 58 items
ADH SKN CLS APL DERMABOND .7 (GAUZE/BANDAGES/DRESSINGS) ×2
BAG DECANTER FOR FLEXI CONT (MISCELLANEOUS) IMPLANT
BLADE SAW SGTL 18X1.27X75 (BLADE) ×2 IMPLANT
BLADE SURG ROTATE 9660 (MISCELLANEOUS) IMPLANT
CAPT HIP TOTAL 2 ×1 IMPLANT
COVER SURGICAL LIGHT HANDLE (MISCELLANEOUS) ×2 IMPLANT
DERMABOND ADVANCED (GAUZE/BANDAGES/DRESSINGS) ×2
DERMABOND ADVANCED .7 DNX12 (GAUZE/BANDAGES/DRESSINGS) IMPLANT
DRAPE C-ARM 42X72 X-RAY (DRAPES) IMPLANT
DRAPE IMP U-DRAPE 54X76 (DRAPES) ×2 IMPLANT
DRAPE INCISE IOBAN 66X45 STRL (DRAPES) ×2 IMPLANT
DRAPE ORTHO SPLIT 77X108 STRL (DRAPES) ×4
DRAPE SURG ORHT 6 SPLT 77X108 (DRAPES) ×2 IMPLANT
DRAPE U-SHAPE 47X51 STRL (DRAPES) ×2 IMPLANT
DRSG AQUACEL AG ADV 3.5X10 (GAUZE/BANDAGES/DRESSINGS) ×2 IMPLANT
DRSG MEPILEX BORDER 4X12 (GAUZE/BANDAGES/DRESSINGS) ×1 IMPLANT
DURAPREP 26ML APPLICATOR (WOUND CARE) ×2 IMPLANT
ELECT BLADE 4.0 EZ CLEAN MEGAD (MISCELLANEOUS) ×2
ELECT REM PT RETURN 9FT ADLT (ELECTROSURGICAL) ×2
ELECTRODE BLDE 4.0 EZ CLN MEGD (MISCELLANEOUS) ×1 IMPLANT
ELECTRODE REM PT RTRN 9FT ADLT (ELECTROSURGICAL) ×1 IMPLANT
FACESHIELD WRAPAROUND (MASK) ×4 IMPLANT
FACESHIELD WRAPAROUND OR TEAM (MASK) ×2 IMPLANT
GLOVE BIO SURGEON STRL SZ7 (GLOVE) ×2 IMPLANT
GLOVE BIO SURGEON STRL SZ7.5 (GLOVE) ×2 IMPLANT
GLOVE BIOGEL PI IND STRL 7.0 (GLOVE) ×1 IMPLANT
GLOVE BIOGEL PI IND STRL 8 (GLOVE) ×1 IMPLANT
GLOVE BIOGEL PI INDICATOR 7.0 (GLOVE) ×1
GLOVE BIOGEL PI INDICATOR 8 (GLOVE) ×1
GOWN STRL REUS W/ TWL LRG LVL3 (GOWN DISPOSABLE) ×2 IMPLANT
GOWN STRL REUS W/ TWL XL LVL3 (GOWN DISPOSABLE) ×1 IMPLANT
GOWN STRL REUS W/TWL LRG LVL3 (GOWN DISPOSABLE) ×4
GOWN STRL REUS W/TWL XL LVL3 (GOWN DISPOSABLE) ×2
KIT BASIN OR (CUSTOM PROCEDURE TRAY) ×2 IMPLANT
KIT ROOM TURNOVER OR (KITS) ×2 IMPLANT
MANIFOLD NEPTUNE II (INSTRUMENTS) ×2 IMPLANT
NDL 18GX1X1/2 (RX/OR ONLY) (NEEDLE) ×1 IMPLANT
NDL SAFETY ECLIPSE 18X1.5 (NEEDLE) ×1 IMPLANT
NEEDLE 18GX1X1/2 (RX/OR ONLY) (NEEDLE) ×2 IMPLANT
NEEDLE HYPO 18GX1.5 SHARP (NEEDLE) ×2
NS IRRIG 1000ML POUR BTL (IV SOLUTION) ×2 IMPLANT
PACK TOTAL JOINT (CUSTOM PROCEDURE TRAY) ×2 IMPLANT
PACK UNIVERSAL I (CUSTOM PROCEDURE TRAY) ×2 IMPLANT
PAD ARMBOARD 7.5X6 YLW CONV (MISCELLANEOUS) ×2 IMPLANT
SPONGE LAP 18X18 X RAY DECT (DISPOSABLE) IMPLANT
SUT MNCRL AB 4-0 PS2 18 (SUTURE) ×2 IMPLANT
SUT MON AB 2-0 CT1 36 (SUTURE) ×2 IMPLANT
SUT VIC AB 0 CT1 27 (SUTURE) ×2
SUT VIC AB 0 CT1 27XBRD ANBCTR (SUTURE) ×1 IMPLANT
SUT VIC AB 1 CT1 27 (SUTURE) ×2
SUT VIC AB 1 CT1 27XBRD ANBCTR (SUTURE) ×1 IMPLANT
SYR 50ML LL SCALE MARK (SYRINGE) ×2 IMPLANT
SYRINGE 20CC LL (MISCELLANEOUS) IMPLANT
TOWEL OR 17X24 6PK STRL BLUE (TOWEL DISPOSABLE) ×2 IMPLANT
TOWEL OR 17X26 10 PK STRL BLUE (TOWEL DISPOSABLE) ×2 IMPLANT
TOWEL OR NON WOVEN STRL DISP B (DISPOSABLE) ×2 IMPLANT
TRAY FOLEY CATH 16FRSI W/METER (SET/KITS/TRAYS/PACK) IMPLANT
WATER STERILE IRR 1000ML POUR (IV SOLUTION) ×2 IMPLANT

## 2015-01-18 NOTE — Progress Notes (Signed)
Utilization review completed.  

## 2015-01-18 NOTE — Progress Notes (Signed)
Physical Therapy Treatment Patient Details Name: Elizabeth [name] MRN: [id number] DOB: [birth year] Today's Date: 01/18/2015    History of Present Illness Pt is a 64 y/o F s/p R THA direct ant approach.  Pt's PMH includes GERD, restless leg syndrome, stress urinary incontinence, osteopenia.  Per pt she has had B shoulder RC surgery and back surgery (laminectomy?).    PT Comments    Patient is making good progress with PT.  From a mobility standpoint anticipate patient will be ready for DC home tomorrow.     Follow Up Recommendations  Outpatient PT;Supervision/Assistance - 24 hour     Equipment Recommendations  Rolling walker with 5" wheels;3in1 (PT)    Recommendations for Other Services       Precautions / Restrictions Precautions Precautions: Fall Restrictions Weight Bearing Restrictions: Yes RLE Weight Bearing: Weight bearing as tolerated    Mobility  Bed Mobility Overal bed mobility: Modified Independent             General bed mobility comments: Increased time, leg hook technique and use of rails  Transfers Overall transfer level: Needs assistance Equipment used: Rolling walker (2 wheeled) Transfers: Sit to/from Stand Sit to Stand: Supervision         General transfer comment: verbal cues for proper hand placement  Ambulation/Gait Ambulation/Gait assistance: Supervision Ambulation Distance (Feet): 100 Feet Assistive device: Rolling walker (2 wheeled) Gait Pattern/deviations: Step-to pattern;Step-through pattern;Antalgic;Decreased stride length;Decreased stance time - right   Gait velocity interpretation: Below normal speed for age/gender General Gait Details: Decreased speed and verbal cues to take small steps when turning rather than swiveling on LLE   Stairs            Wheelchair Mobility    Modified Rankin (Stroke Patients Only)       Balance Overall balance assessment: Needs assistance Sitting-balance support: No upper  extremity supported;Feet supported Sitting balance-Leahy Scale: Good     Standing balance support: Bilateral upper extremity supported;During functional activity Standing balance-Leahy Scale: Fair                      Cognition Arousal/Alertness: Awake/alert Behavior During Therapy: WFL for tasks assessed/performed Overall Cognitive Status: Within Functional Limits for tasks assessed                      Exercises      General Comments        Pertinent Vitals/Pain Pain Assessment: 0-10 Pain Score: 5  Pain Location: R hip Pain Descriptors / Indicators: Aching;Pressure Pain Intervention(s): Limited activity within patient's tolerance;Monitored during session;Repositioned    Home Living                      Prior Function            PT Goals (current goals can now be found in the care plan section) Acute Rehab PT Goals Patient Stated Goal: to go home PT Goal Formulation: With patient/family Time For Goal Achievement: 01/25/15 Potential to Achieve Goals: Good Progress towards PT goals: Progressing toward goals    Frequency  7X/week    PT Plan Current plan remains appropriate    Co-evaluation             End of Session Equipment Utilized During Treatment: Gait belt Activity Tolerance: Patient tolerated treatment well Patient left: in bed;with call bell/phone within reach;with SCD's reapplied     Time: 2094-7096 PT Time Calculation (min) (ACUTE ONLY): 22 min  Charges:  $Gait Training: 8-22 mins                    G Codes:      Joslyn Hy PT, [place] [phone] Pager: [phone] 01/18/2015, 5:08 PM

## 2015-01-18 NOTE — Anesthesia Procedure Notes (Signed)
Procedure Name: Intubation Date/Time: 01/18/2015 7:31 AM Performed by: Manus Gunning, Maricia Scotti J Pre-anesthesia Checklist: Patient identified, Timeout performed, Emergency Drugs available, Suction available and Patient being monitored Patient Re-evaluated:Patient Re-evaluated prior to inductionOxygen Delivery Method: Circle system utilized Preoxygenation: Pre-oxygenation with 100% oxygen Intubation Type: IV induction Ventilation: Mask ventilation without difficulty Laryngoscope Size: Mac and 3 Grade View: Grade II Tube type: Oral Tube size: 7.0 mm Number of attempts: 1 Airway Equipment and Method: Stylet Placement Confirmation: ETT inserted through vocal cords under direct vision,  positive ETCO2,  CO2 detector and breath sounds checked- equal and bilateral Secured at: 21 cm Tube secured with: Tape Dental Injury: Teeth and Oropharynx as per pre-operative assessment

## 2015-01-18 NOTE — Evaluation (Signed)
Physical Therapy Evaluation Patient Details Name: Elizabeth [name] MRN: [id number] DOB: 24-Aug-1951 Today's Date: 01/18/2015   History of Present Illness  Pt is a 64 y/o F s/p R THA direct ant approach.  Pt's PMH includes GERD, restless leg syndrome, stress urinary incontinence, osteopenia.  Per pt she has had B shoulder RC surgery and back surgery (laminectomy?).  Clinical Impression  Pt is s/p R THA resulting in the deficits listed below (see PT Problem List).  Pt will benefit from skilled PT to increase their independence and safety with mobility to allow discharge to the venue listed below.      Follow Up Recommendations Outpatient PT;Supervision/Assistance - 24 hour    Equipment Recommendations  Rolling walker with 5" wheels;3in1 (PT)    Recommendations for Other Services       Precautions / Restrictions Precautions Precautions: Fall Restrictions Weight Bearing Restrictions: Yes RLE Weight Bearing: Weight bearing as tolerated      Mobility  Bed Mobility Overal bed mobility: Modified Independent             General bed mobility comments: Increased time and verbal cues for proper sequencing.  Pt used handrails.  Transfers Overall transfer level: Needs assistance Equipment used: Rolling walker (2 wheeled) Transfers: Sit to/from Stand Sit to Stand: Min guard         General transfer comment: Verbal cues to push from bed rather than pull on RW  Ambulation/Gait Ambulation/Gait assistance: Min guard Ambulation Distance (Feet): 20 Feet Assistive device: Rolling walker (2 wheeled) Gait Pattern/deviations: Step-to pattern;Decreased stride length;Decreased stance time - right;Antalgic   Gait velocity interpretation: Below normal speed for age/gender General Gait Details: Verbal cues to stand upright, pt demonstrated understanding.  Stairs            Wheelchair Mobility    Modified Rankin (Stroke Patients Only)       Balance Overall balance  assessment: Needs assistance Sitting-balance support: Bilateral upper extremity supported;Feet supported Sitting balance-Leahy Scale: Fair     Standing balance support: Bilateral upper extremity supported Standing balance-Leahy Scale: Fair                               Pertinent Vitals/Pain Pain Assessment: 0-10 Pain Score: 4  Pain Location: R hip Pain Descriptors / Indicators: Aching;Discomfort;Grimacing;Guarding;Dull Pain Intervention(s): Limited activity within patient's tolerance;Monitored during session;Repositioned    Home Living Family/patient expects to be discharged to:: Private residence Living Arrangements: Spouse/significant other Available Help at Discharge: Family;Available 24 hours/day Type of Home: House Home Access: Level entry     Home Layout: One level;Able to live on main level with bedroom/bathroom Home Equipment: Kasandra Knudsen - single point      Prior Function Level of Independence: Independent               Hand Dominance   Dominant Hand: Right    Extremity/Trunk Assessment               Lower Extremity Assessment: RLE deficits/detail RLE Deficits / Details: as expected s/p R THA    Cervical / Trunk Assessment: Normal  Communication   Communication: No difficulties  Cognition Arousal/Alertness: Awake/alert Behavior During Therapy: WFL for tasks assessed/performed Overall Cognitive Status: Within Functional Limits for tasks assessed                      General Comments      Exercises  Assessment/Plan    PT Assessment Patient needs continued PT services  PT Diagnosis Difficulty walking;Abnormality of gait;Generalized weakness;Acute pain   PT Problem List Decreased strength;Decreased range of motion;Decreased activity tolerance;Decreased balance;Decreased mobility;Decreased knowledge of use of DME;Decreased safety awareness;Decreased knowledge of precautions;Pain  PT Treatment Interventions DME  instruction;Gait training;Functional mobility training;Therapeutic activities;Therapeutic exercise;Balance training;Neuromuscular re-education;Patient/family education;Modalities   PT Goals (Current goals can be found in the Care Plan section) Acute Rehab PT Goals Patient Stated Goal: to go home PT Goal Formulation: With patient/family Time For Goal Achievement: 01/25/15 Potential to Achieve Goals: Good    Frequency 7X/week   Barriers to discharge        Co-evaluation               End of Session Equipment Utilized During Treatment: Gait belt Activity Tolerance: Patient tolerated treatment well Patient left: in chair;with call bell/phone within reach;with family/visitor present Nurse Communication: Mobility status;Precautions;Weight bearing status         Time: 5852-7782 PT Time Calculation (min) (ACUTE ONLY): 29 min   Charges:   PT Evaluation $Initial PT Evaluation Tier I: 1 Procedure PT Treatments $Gait Training: 8-22 mins   PT G CodesJoslyn Hy PT, DPT [phone] Pager: [phone]  01/18/2015, 2:14 PM

## 2015-01-18 NOTE — Transfer of Care (Signed)
Immediate Anesthesia Transfer of Care Note  Patient: Elizabeth [name]  Procedure(s) Performed: Procedure(s): RIGHT TOTAL HIP ARTHROPLASTY ANTERIOR APPROACH (Right)  Patient Location: PACU  Anesthesia Type:General  Level of Consciousness: awake and alert   Airway & Oxygen Therapy: Patient Spontanous Breathing and Patient connected to nasal cannula oxygen  Post-op Assessment: Report given to RN and Post -op Vital signs reviewed and stable  Post vital signs: Reviewed and stable  Last Vitals:  Filed Vitals:   01/18/15 0925  BP:   Pulse:   Temp: 36.3 C  Resp:     Complications: No apparent anesthesia complications

## 2015-01-18 NOTE — Discharge Instructions (Signed)
INSTRUCTIONS AFTER JOINT REPLACEMENT   o Remove items at home which could result in a fall. This includes throw rugs or furniture in walking pathways o ICE to the affected joint every three hours while awake for 30 minutes at a time, for at least the first 3-5 days, and then as needed for pain and swelling.  Continue to use ice for pain and swelling. You may notice swelling that will progress down to the foot and ankle.  This is normal after surgery.  Elevate your leg when you are not up walking on it.   o Continue to use the breathing machine you got in the hospital (incentive spirometer) which will help keep your temperature down.  It is common for your temperature to cycle up and down following surgery, especially at night when you are not up moving around and exerting yourself.  The breathing machine keeps your lungs expanded and your temperature down.   DIET:  As you were doing prior to hospitalization, we recommend a well-balanced diet.  DRESSING / WOUND CARE / SHOWERING  Keep the surgical dressing until follow up. IF THE DRESSING FALLS OFF or the wound gets wet inside, change the dressing with sterile gauze.  Please use good hand washing techniques before changing the dressing.  Do not use any lotions or creams on the incision until instructed by your surgeon.    ACTIVITY  o Increase activity slowly as tolerated, but follow the weight bearing instructions below.   o No driving for 6 weeks or until further direction given by your physician.  You cannot drive while taking narcotics.  o No lifting or carrying greater than 10 lbs. until further directed by your surgeon. o Avoid periods of inactivity such as sitting longer than an hour when not asleep. This helps prevent blood clots.  o You may return to work once you are authorized by your doctor.     WEIGHT BEARING   Weight bearing as tolerated with assist device (walker, cane, etc) as directed, use it as long as suggested by your  surgeon or therapist, typically at least 4-6 weeks.   EXERCISES  Results after joint replacement surgery are often greatly improved when you follow the exercise, range of motion and muscle strengthening exercises prescribed by your doctor. Safety measures are also important to protect the joint from further injury. Any time any of these exercises cause you to have increased pain or swelling, decrease what you are doing until you are comfortable again and then slowly increase them. If you have problems or questions, call your caregiver or physical therapist for advice.   Rehabilitation is important following a joint replacement. After just a few days of immobilization, the muscles of the leg can become weakened and shrink (atrophy).  These exercises are designed to build up the tone and strength of the thigh and leg muscles and to improve motion. Often times heat used for twenty to thirty minutes before working out will loosen up your tissues and help with improving the range of motion but do not use heat for the first two weeks following surgery (sometimes heat can increase post-operative swelling).   These exercises can be done on a training (exercise) mat, on the floor, on a table or on a bed. Use whatever works the best and is most comfortable for you.    Use music or television while you are exercising so that the exercises are a pleasant break in your day. This will make your life better  with the exercises acting as a break in your routine that you can look forward to.   Perform all exercises about fifteen times, three times per day or as directed.  You should exercise both the operative leg and the other leg as well.  Exercises include:    Quad Sets - Tighten up the muscle on the front of the thigh (Quad) and hold for 5-10 seconds.    Straight Leg Raises - With your knee straight (if you were given a brace, keep it on), lift the leg to 60 degrees, hold for 3 seconds, and slowly lower the leg.   Perform this exercise against resistance later as your leg gets stronger.   Leg Slides: Lying on your back, slowly slide your foot toward your buttocks, bending your knee up off the floor (only go as far as is comfortable). Then slowly slide your foot back down until your leg is flat on the floor again.   Angel Wings: Lying on your back spread your legs to the side as far apart as you can without causing discomfort.   Hamstring Strength:  Lying on your back, push your heel against the floor with your leg straight by tightening up the muscles of your buttocks.  Repeat, but this time bend your knee to a comfortable angle, and push your heel against the floor.  You may put a pillow under the heel to make it more comfortable if necessary.   A rehabilitation program following joint replacement surgery can speed recovery and prevent re-injury in the future due to weakened muscles. Contact your doctor or a physical therapist for more information on knee rehabilitation.    CONSTIPATION  Constipation is defined medically as fewer than three stools per week and severe constipation as less than one stool per week.  Even if you have a regular bowel pattern at home, your normal regimen is likely to be disrupted due to multiple reasons following surgery.  Combination of anesthesia, postoperative narcotics, change in appetite and fluid intake all can affect your bowels.   YOU MUST use at least one of the following options; they are listed in order of increasing strength to get the job done.  They are all available over the counter, and you may need to use some, POSSIBLY even all of these options:    Drink plenty of fluids (prune juice may be helpful) and high fiber foods Colace 100 mg by mouth twice a day  Senokot for constipation as directed and as needed Dulcolax (bisacodyl), take with full glass of water  Miralax (polyethylene glycol) once or twice a day as needed.  If you have tried all these things and  are unable to have a bowel movement in the first 3-4 days after surgery call either your surgeon or your primary doctor.    If you experience loose stools or diarrhea, hold the medications until you stool forms back up.  If your symptoms do not get better within 1 week or if they get worse, check with your doctor.  If you experience "the worst abdominal pain ever" or develop nausea or vomiting, please contact the office immediately for further recommendations for treatment.   ITCHING:  If you experience itching with your medications, try taking only a single pain pill, or even half a pain pill at a time.  You can also use Benadryl over the counter for itching or also to help with sleep.   TED HOSE STOCKINGS:  Use stockings on both legs until  for at least 2 weeks or as directed by physician office. They may be removed at night for sleeping.  MEDICATIONS:  See your medication summary on the After Visit Summary that nursing will review with you.  You may have some home medications which will be placed on hold until you complete the course of blood thinner medication.  It is important for you to complete the blood thinner medication as prescribed.  PRECAUTIONS:  If you experience chest pain or shortness of breath - call 911 immediately for transfer to the hospital emergency department.   If you develop a fever greater that 101 F, purulent drainage from wound, increased redness or drainage from wound, foul odor from the wound/dressing, or calf pain - CONTACT YOUR SURGEON.                                                   FOLLOW-UP APPOINTMENTS:  If you do not already have a post-op appointment, please call the office for an appointment to be seen by your surgeon.  Guidelines for how soon to be seen are listed in your After Visit Summary, but are typically between 1-4 weeks after surgery.   MAKE SURE YOU:   Understand these instructions.   Get help right away if you are not doing well or get  worse.    Thank you for letting us be a part of your medical care team.  It is a privilege we respect greatly.  We hope these instructions will help you stay on track for a fast and full recovery!

## 2015-01-18 NOTE — Interval H&P Note (Signed)
History and Physical Interval Note:  01/18/2015 7:18 AM  Elizabeth [name]  has presented today for surgery, with the diagnosis of OA RIGHT HIP  The various methods of treatment have been discussed with the patient and family. After consideration of risks, benefits and other options for treatment, the patient has consented to  Procedure(s): RIGHT TOTAL HIP ARTHROPLASTY ANTERIOR APPROACH (Right) as a surgical intervention .  The patient's history has been reviewed, patient examined, no change in status, stable for surgery.  I have reviewed the patient's chart and labs.  Questions were answered to the patient's satisfaction.     MURPHY, TIMOTHY D

## 2015-01-18 NOTE — Anesthesia Postprocedure Evaluation (Signed)
  Anesthesia Post-op Note  Patient: Elizabeth [name]  Procedure(s) Performed: Procedure(s): RIGHT TOTAL HIP ARTHROPLASTY ANTERIOR APPROACH (Right)  Patient Location: PACU  Anesthesia Type:General  Level of Consciousness: awake and alert   Airway and Oxygen Therapy: Patient Spontanous Breathing  Post-op Pain: mild  Post-op Assessment: Post-op Vital signs reviewed  Post-op Vital Signs: Reviewed  Last Vitals:  Filed Vitals:   01/18/15 1053  BP: 107/60  Pulse: 52  Temp: 36.3 C  Resp: 14    Complications: No apparent anesthesia complications

## 2015-01-18 NOTE — Op Note (Signed)
01/18/2015  8:55 AM  PATIENT:  Elizabeth [name]   MRN: [id number]  PRE-OPERATIVE DIAGNOSIS:  OA RIGHT HIP  POST-OPERATIVE DIAGNOSIS:  OA RIGHT HIP  PROCEDURE:  Procedure(s): RIGHT TOTAL HIP ARTHROPLASTY ANTERIOR APPROACH  PREOPERATIVE INDICATIONS:    Elizabeth [name] is an 64 y.o. female who has a diagnosis of <principal problem not specified> and elected for surgical management after failing conservative treatment.  The risks benefits and alternatives were discussed with the patient including but not limited to the risks of nonoperative treatment, versus surgical intervention including infection, bleeding, nerve injury, periprosthetic fracture, the need for revision surgery, dislocation, leg length discrepancy, blood clots, cardiopulmonary complications, morbidity, mortality, among others, and they were willing to proceed.     OPERATIVE REPORT     SURGEON:   MURPHY, Ernesta Amble, MD    ASSISTANT:  Lovett Calender, PA-C, She was present and scrubbed throughout the case, critical for completion in a timely fashion, and for retraction, instrumentation, and closure.     ANESTHESIA:  General    COMPLICATIONS:  None.     COMPONENTS:  [employer]ker acolade fit femur size 3 with a 36 mm +0 head ball and a PSL acetabular shell size 36 with a  polyethylene liner    PROCEDURE IN DETAIL:   The patient was met in the holding area and  identified.  The appropriate hip was identified and marked at the operative site.  The patient was then transported to the OR  and  placed under general anesthesia.  At that point, the patient was  placed in the supine position and  secured to the operating room table and all bony prominences padded. He received pre-operative antibiotics    The operative lower extremity was prepped from the iliac crest to the distal leg.  Sterile draping was performed.  Time out was performed prior to incision.      Skin incision was made just 2 cm lateral to the ASIS   extending in line with the tensor fascia lata. Electrocautery was used to control all bleeders. I dissected down sharply to the fascia of the tensor fascia lata was confirmed that the muscle fibers beneath were running posteriorly. I then incised the fascia over the superficial tensor fascia lata in line with the incision. The fascia was elevated off the anterior aspect of the muscle the muscle was retracted posteriorly and protected throughout the case. I then used electrocautery to incise the tensor fascia lata fascia control and all bleeders. Immediately visible was the fat over top of the anterior neck and capsule.  I removed the anterior fat from the capsule and elevated the rectus muscle off of the anterior capsule. I then removed a large time of capsule. The retractors were then placed over the anterior acetabulum as well as around the superior and inferior neck.  I then removed a section of the femoral neck and a napkin ring fashion. Then used the power course to remove the femoral head from the acetabulum and thoroughly irrigated the acetabulum. I sized the femoral head.    I then exposed the deep acetabulum, cleared out any tissue including the ligamentum teres.   After adequate visualization, I excised the labrum, and then sequentially reamed.  I placed the trial acetabulum, which seated nicely, and then impacted the real cup into place.  Appropriate version and inclination was confirmed clinically matching their bony anatomy, and also with the use transverse acetabular ligament.  I placed a mm screw in the posterior/superio position with  an excellent bite.    I then placed the polyethylene liner in place  I then abducted the leg and released the external rotators from the posterior femur allowing it to be easily delivered up lateral and anterior to the acetabulum for preparation of the femoral canal.    I then prepared the proximal femur using the cookie-cutter and then sequentially reamed  and broached.  A trial broach, neck, and head was utilized, and I reduced the hip and it was found to have excellent stability with functional range of motion..  I then impacted the real femoral prosthesis into place into the appropriate version, slightly anteverted to the normal anatomy, and I impacted the real head ball into place. The hip was then reduced and taken through functional range of motion and found to have excellent stability. Leg lengths were restored.  I then irrigated the hip copiously again with, and repaired the fascia with Vicryl, followed by monocryl for the subcutaneous tissue, Monocryl for the skin, Steri-Strips and sterile gauze. The wounds were injected. The patient was then awakened and returned to PACU in stable and satisfactory condition. There were no complications.  POST OPERATIVE PLAN: WBAT, DVT px: SCD's/TED and ASA 325  Edmonia Lynch, MD Orthopedic Surgeon [phone]   This note was generated using a template and dragon dictation system. In light of that, I have reviewed the note and all aspects of it are applicable to this case. Any dictation errors are due to the computerized dictation system.

## 2015-01-18 NOTE — Anesthesia Preprocedure Evaluation (Addendum)
Anesthesia Evaluation  Patient identified by MRN, date of birth, ID band Patient awake    Reviewed: Allergy & Precautions, NPO status , Patient's Chart, lab work & pertinent test results  Airway Mallampati: II  TM Distance: >3 FB Neck ROM: Full    Dental  (+) Teeth Intact, Dental Advisory Given   Pulmonary former smoker,  breath sounds clear to auscultation        Cardiovascular negative cardio ROS  Rhythm:Regular Rate:Normal  02/2011 Cath with minimal luminal irregularities.   Neuro/Psych negative neurological ROS     GI/Hepatic Neg liver ROS, GERD-  Medicated and Controlled,  Endo/Other  Morbid obesity  Renal/GU negative Renal ROS     Musculoskeletal  (+) Arthritis -,   Abdominal   Peds  Hematology negative hematology ROS (+)   Anesthesia Other Findings   Reproductive/Obstetrics                           Anesthesia Physical Anesthesia Plan  ASA: II  Anesthesia Plan: General   Post-op Pain Management:    Induction: Intravenous  Airway Management Planned: Oral ETT  Additional Equipment:   Intra-op Plan:   Post-operative Plan: Extubation in OR  Informed Consent: I have reviewed the patients History and Physical, chart, labs and discussed the procedure including the risks, benefits and alternatives for the proposed anesthesia with the patient or authorized representative who has indicated his/her understanding and acceptance.   Dental advisory given  Plan Discussed with: CRNA  Anesthesia Plan Comments:        Anesthesia Quick Evaluation

## 2015-01-19 ENCOUNTER — Encounter (HOSPITAL_COMMUNITY): Payer: Self-pay | Admitting: Orthopedic Surgery

## 2015-01-19 DIAGNOSIS — M1611 Unilateral primary osteoarthritis, right hip: Secondary | ICD-10-CM | POA: Diagnosis present

## 2015-01-19 NOTE — Progress Notes (Signed)
     Subjective:  POD #1 RATH arthroplasty.  Patient reports pain as mild to moderate.  Up with PT yesterday.    Objective:   VITALS:   Filed Vitals:   01/18/15 2316 01/19/15 0025 01/19/15 0400 01/19/15 0527  BP:  115/60  110/60  Pulse:  64  66  Temp:  98.3 F (36.8 C)  98.4 F (36.9 C)  TempSrc:      Resp: 14 14 14 14   Height:      Weight:      SpO2: 99% 99% 94% 99%    Neurologically intact ABD soft Neurovascular intact Sensation intact distally Intact pulses distally Dorsiflexion/Plantar flexion intact Incision: dressing C/D/I   Lab Results  Component Value Date   WBC 8.2 01/07/2015   HGB 14.2 01/07/2015   HCT 43.8 01/07/2015   MCV 91.8 01/07/2015   PLT 276 01/07/2015   BMET    Component Value Date/Time   NA 138 01/07/2015 1221   K 4.1 01/07/2015 1221   CL 108 01/07/2015 1221   CO2 26 01/07/2015 1221   GLUCOSE 71 01/07/2015 1221   BUN 11 01/07/2015 1221   CREATININE 0.65 01/07/2015 1221   CALCIUM 9.4 01/07/2015 1221   GFRNONAA >90 01/07/2015 1221   GFRAA >90 01/07/2015 1221     Assessment/Plan: 1 Day Post-Op   Principal Problem:   Primary osteoarthritis of right hip Active Problems:   DJD (degenerative joint disease)   Up with therapy WBAT in the RLE ASA 325mg  daily for 30days post op for DVT prophylaxis Plan to d/c to home today after working with PT this morning   Gared Gillie Marie 01/19/2015, 7:38 AM Cell [phone]

## 2015-01-19 NOTE — Discharge Summary (Signed)
Physician Discharge Summary  Patient ID: Elizabeth [name] MRN: [id number] DOB/AGE: 1951-04-03 64 y.o.  Admit date: 01/18/2015 Discharge date: 01/19/2015  Admission Diagnoses:  Primary osteoarthritis of right hip  Discharge Diagnoses:  Principal Problem:   Primary osteoarthritis of right hip Active Problems:   DJD (degenerative joint disease)   Past Medical History  Diagnosis Date  . GERD (gastroesophageal reflux disease)   . Degenerative joint disease (DJD) of hip   . Constipation due to opioid therapy   . Vaso-vagal reaction     after back surgery  . Restless leg syndrome     takes Mirapex and xanax  . Osteopenia   . SUI (stress urinary incontinence, female)     Surgeries: Procedure(s): RIGHT TOTAL HIP ARTHROPLASTY ANTERIOR APPROACH on 01/18/2015   Consultants (if any):    Discharged Condition: Improved  Hospital Course: Elizabeth [name] is an 64 y.o. female who was admitted 01/18/2015 with a diagnosis of Primary osteoarthritis of right hip and went to the operating room on 01/18/2015 and underwent the above named procedures.    She was given perioperative antibiotics:  Anti-infectives    Start     Dose/Rate Route Frequency Ordered Stop   01/18/15 1330  ceFAZolin (ANCEF) IVPB 1 g/50 mL premix     1 g 100 mL/hr over 30 Minutes Intravenous Every 6 hours 01/18/15 1056 01/18/15 2240   01/18/15 0600  ceFAZolin (ANCEF) IVPB 2 g/50 mL premix     2 g 100 mL/hr over 30 Minutes Intravenous On call to O.R. 01/17/15 1320 01/18/15 0722    .  She was given sequential compression devices, early ambulation, and ASA 325mg  for DVT prophylaxis.  She benefited maximally from the hospital stay and there were no complications.    Recent vital signs:  Filed Vitals:   01/19/15 0527  BP: 110/60  Pulse: 66  Temp: 98.4 F (36.9 C)  Resp: 14    Recent laboratory studies:  Lab Results  Component Value Date   HGB 14.2 01/07/2015   HGB 13.3 09/18/2007   Lab  Results  Component Value Date   WBC 8.2 01/07/2015   PLT 276 01/07/2015   Lab Results  Component Value Date   INR 0.96 01/07/2015   Lab Results  Component Value Date   NA 138 01/07/2015   K 4.1 01/07/2015   CL 108 01/07/2015   CO2 26 01/07/2015   BUN 11 01/07/2015   CREATININE 0.65 01/07/2015   GLUCOSE 71 01/07/2015    Discharge Medications:     Medication List    TAKE these medications        ALPRAZolam 0.5 MG tablet  Commonly known as:  XANAX  Take 1.5 mg by mouth at bedtime.     aspirin 325 MG tablet  Take 1 tablet (325 mg total) by mouth daily.     CALCIUM + D PO  Take 1 tablet by mouth daily.     celecoxib 100 MG capsule  Commonly known as:  CELEBREX  Take 1 capsule (100 mg total) by mouth 2 (two) times daily.     docusate sodium 100 MG capsule  Commonly known as:  COLACE  Take 1 capsule (100 mg total) by mouth 2 (two) times daily.     HYDROcodone-acetaminophen 5-325 MG per tablet  Commonly known as:  NORCO  Take 1-2 tablets by mouth every 6 (six) hours as needed for moderate pain.     multivitamin with minerals tablet  Take 1 tablet by mouth daily.  omeprazole 20 MG capsule  Commonly known as:  PRILOSEC  Take 20 mg by mouth daily.     ondansetron 4 MG tablet  Commonly known as:  ZOFRAN  Take 1 tablet (4 mg total) by mouth every 8 (eight) hours as needed for nausea or vomiting.     pramipexole 1 MG tablet  Commonly known as:  MIRAPEX  Take 1 mg by mouth at bedtime.        Diagnostic Studies: Dg Pelvis Portable  01/18/2015   CLINICAL DATA:  Status post right hip replacement  EXAM: PORTABLE PELVIS 1-2 VIEWS  COMPARISON:  None.  FINDINGS: The right hip prosthesis is seen. No acute bony abnormality is noted. Air is noted in the operative bed.  IMPRESSION: Status post right hip replacement without acute abnormality.   Electronically Signed   By: Inez Catalina M.D.   On: 01/18/2015 10:00    Disposition:       Discharge Instructions    Weight  bearing as tolerated    Complete by:  As directed   Laterality:  right  Extremity:  Lower           Follow-up Information    Follow up with MURPHY, TIMOTHY D, MD In 1 week.   Specialty:  Orthopedic Surgery   Contact information:   [address] [place] [id number] [phone]        Signed: Gae Dry 01/19/2015, 7:48 AM Cell [phone]

## 2015-01-19 NOTE — Progress Notes (Signed)
Physical Therapy Treatment Patient Details Name: Elizabeth [name] MRN: [id number] DOB: [birth year] Today's Date: 01/19/2015    History of Present Illness Pt is a 64 y/o F s/p R THA direct ant approach.  Pt's PMH includes GERD, restless leg syndrome, stress urinary incontinence, osteopenia.  Per pt she has had B shoulder RC surgery and back surgery (laminectomy?).    PT Comments    Patient is making good progress with PT.  From a mobility standpoint anticipate patient will be ready for DC home today.     Follow Up Recommendations  Outpatient PT;Supervision/Assistance - 24 hour     Equipment Recommendations  Rolling walker with 5" wheels;3in1 (PT)    Recommendations for Other Services       Precautions / Restrictions Precautions Precautions: Fall Restrictions Weight Bearing Restrictions: Yes RLE Weight Bearing: Weight bearing as tolerated    Mobility  Bed Mobility Overal bed mobility: Modified Independent             General bed mobility comments: Increased time, leg hook technique  Transfers Overall transfer level: Needs assistance Equipment used: Rolling walker (2 wheeled) Transfers: Sit to/from Stand Sit to Stand: Supervision         General transfer comment: reminder to always have RW in front of her before she stand up  Ambulation/Gait Ambulation/Gait assistance: Supervision Ambulation Distance (Feet): 200 Feet Assistive device: Rolling walker (2 wheeled) Gait Pattern/deviations: Step-through pattern;Decreased stance time - right;Decreased stride length;Antalgic   Gait velocity interpretation: Below normal speed for age/gender General Gait Details: Pt improved turning while ambulating by taking several steps.  Verbal cues to not place so much weight through her UEs, pt was able to do so.   Stairs            Wheelchair Mobility    Modified Rankin (Stroke Patients Only)       Balance Overall balance assessment: Needs  assistance Sitting-balance support: No upper extremity supported;Feet supported Sitting balance-Leahy Scale: Good     Standing balance support: Bilateral upper extremity supported Standing balance-Leahy Scale: Fair                      Cognition Arousal/Alertness: Awake/alert Behavior During Therapy: WFL for tasks assessed/performed Overall Cognitive Status: Within Functional Limits for tasks assessed                      Exercises Total Joint Exercises Knee Flexion: AROM;Right;10 reps;Standing Marching in Standing: AROM;Both;15 reps;Standing Standing Hip Extension: AROM;Right;15 reps;Standing    General Comments        Pertinent Vitals/Pain Pain Assessment: 0-10 Pain Score: 4  Pain Location: R hip Pain Descriptors / Indicators: Aching;Constant;Dull Pain Intervention(s): Limited activity within patient's tolerance;Monitored during session;Repositioned;Ice applied    Home Living                      Prior Function            PT Goals (current goals can now be found in the care plan section) Acute Rehab PT Goals Patient Stated Goal: to go home PT Goal Formulation: With patient/family Time For Goal Achievement: 01/25/15 Potential to Achieve Goals: Good Progress towards PT goals: Progressing toward goals    Frequency  7X/week    PT Plan Current plan remains appropriate    Co-evaluation             End of Session Equipment Utilized During Treatment: Gait belt Activity Tolerance: Patient tolerated  treatment well Patient left: in chair;with call bell/phone within reach;with family/visitor present     Time: 5396-7289 PT Time Calculation (min) (ACUTE ONLY): 22 min  Charges:  $Gait Training: 8-22 mins                    G Codes:      Joslyn Hy PT, [place] [phone] Pager: [phone] 01/19/2015, 11:06 AM

## 2015-01-19 NOTE — Care Management Note (Signed)
CARE MANAGEMENT NOTE 01/19/2015  Patient:  Elizabeth [name],Elizabeth [name]   Account Number:  [id number]  Date Initiated:  01/19/2015  Documentation initiated by:  Ricki Miller  Subjective/Objective Assessment:   64 yr old female admitted with osetoarthritis of the right hip. patient underwent a right total hip arthroplasty.     Action/Plan:   Patient was preoperatively setup with [facility], no changes. Has family support at discharge.   Anticipated DC Date:  01/19/2015   Anticipated DC Plan:  [place]  CM consult      [facility] Choice  HOME HEALTH  DURABLE MEDICAL EQUIPMENT   Choice offered to / List presented to:  C-1 Patient   DME arranged  [place]  3-N-1      DME agency  [employer]     [place] arranged  HH-2 PT      [place] agency  [facility]   Status of service:  Completed, signed off Medicare Important Message given?   (If response is "NO", the following Medicare IM given date fields will be blank) Date Medicare IM given:   Medicare IM given by:   Date Additional Medicare IM given:   Additional Medicare IM given by:    Discharge Disposition:  [place]  Per UR Regulation:  Reviewed for med. necessity/level of care/duration of stay

## 2015-01-19 NOTE — Progress Notes (Signed)
Laken [name] discharged home per MD order. Discharge instructions reviewed and discussed with patient. All questions and concerns answered. Copy of instructions and scripts given to patient. IV removed.  Patient escorted to car by staff in a wheelchair. No distress noted upon discharge.   Nicki Reaper Bradford 01/19/2015 12:00 PM

## 2015-05-16 ENCOUNTER — Telehealth: Payer: Self-pay | Admitting: Family Medicine

## 2015-05-16 MED ORDER — AMOXICILLIN 500 MG PO CAPS: 2000.0000 mg | ORAL_CAPSULE | Freq: Once | ORAL | Status: DC | PRN

## 2015-05-16 MED ORDER — ALPRAZOLAM 0.5 MG PO TABS: 1.5000 mg | ORAL_TABLET | Freq: Every day | ORAL | Status: DC

## 2015-05-16 NOTE — Telephone Encounter (Signed)
Called Xanax into pharmacy, left message informing pt that prescriptions have sent in. Renaldo Fiddler, CMA

## 2015-05-16 NOTE — Telephone Encounter (Signed)
Ok to cal lin Xanax. Amoxicillin sent in. Thanks.

## 2015-05-16 NOTE — Telephone Encounter (Signed)
Pt is having minor dental appt in a couple weeks and needs anitibiotic before that appt .  Pt needs refill on her ALPRAZolam (XANAX) 0.5 MG tablet  Going out of town for daughters surgery.  Thanks Con Memos.

## 2015-07-08 ENCOUNTER — Other Ambulatory Visit: Payer: Self-pay | Admitting: Family Medicine

## 2015-07-08 DIAGNOSIS — G2581 Restless legs syndrome: Secondary | ICD-10-CM

## 2015-07-08 NOTE — Telephone Encounter (Signed)
Last OV 12/2014  Thanks,   -Mickel Baas

## 2015-09-07 ENCOUNTER — Ambulatory Visit (INDEPENDENT_AMBULATORY_CARE_PROVIDER_SITE_OTHER): Payer: Federal, State, Local not specified - PPO | Admitting: Family Medicine

## 2015-09-07 ENCOUNTER — Other Ambulatory Visit: Payer: Self-pay

## 2015-09-07 ENCOUNTER — Encounter: Payer: Self-pay | Admitting: Family Medicine

## 2015-09-07 VITALS — BP 118/72 | HR 72 | Temp 97.7°F | Resp 16 | Wt 161.0 lb

## 2015-09-07 DIAGNOSIS — F329 Major depressive disorder, single episode, unspecified: Secondary | ICD-10-CM | POA: Insufficient documentation

## 2015-09-07 DIAGNOSIS — M7020 Olecranon bursitis, unspecified elbow: Secondary | ICD-10-CM | POA: Insufficient documentation

## 2015-09-07 DIAGNOSIS — M1990 Unspecified osteoarthritis, unspecified site: Secondary | ICD-10-CM | POA: Insufficient documentation

## 2015-09-07 DIAGNOSIS — I201 Angina pectoris with documented spasm: Secondary | ICD-10-CM | POA: Insufficient documentation

## 2015-09-07 DIAGNOSIS — M26609 Unspecified temporomandibular joint disorder, unspecified side: Secondary | ICD-10-CM

## 2015-09-07 DIAGNOSIS — G43909 Migraine, unspecified, not intractable, without status migrainosus: Secondary | ICD-10-CM | POA: Insufficient documentation

## 2015-09-07 DIAGNOSIS — F419 Anxiety disorder, unspecified: Secondary | ICD-10-CM | POA: Insufficient documentation

## 2015-09-07 DIAGNOSIS — M26659 Arthropathy of unspecified temporomandibular joint: Secondary | ICD-10-CM | POA: Insufficient documentation

## 2015-09-07 DIAGNOSIS — E785 Hyperlipidemia, unspecified: Secondary | ICD-10-CM | POA: Insufficient documentation

## 2015-09-07 DIAGNOSIS — D72829 Elevated white blood cell count, unspecified: Secondary | ICD-10-CM | POA: Insufficient documentation

## 2015-09-07 DIAGNOSIS — M7021 Olecranon bursitis, right elbow: Secondary | ICD-10-CM

## 2015-09-07 DIAGNOSIS — Z96649 Presence of unspecified artificial hip joint: Secondary | ICD-10-CM | POA: Insufficient documentation

## 2015-09-07 MED ORDER — MELOXICAM 15 MG PO TABS: 15.0000 mg | ORAL_TABLET | Freq: Every day | ORAL | Status: DC | PRN

## 2015-09-07 NOTE — Patient Instructions (Signed)
Elbow Bursitis  Elbow bursitis is inflammation of the fluid-filled sac (bursa) between the tip of your elbow bone (olecranon) and your skin. Elbow bursitis may also be called olecranon bursitis.  Normally, the olecranon bursa has only a small amount of fluid in it to cushion and protect your elbow bone. Elbow bursitis causes fluid to build up inside the bursa. Over time, this swelling and inflammation can cause pain when you bend or lean on your elbow.   CAUSES  Elbow bursitis may be caused by:    Elbow injury (acute trauma).   Leaning on hard surfaces for long periods of time.   Infection from an injury that breaks the skin near your elbow.   A bone growth (spur) that forms at the tip of your elbow.   A medical condition that causes inflammation in your body, such as gout or rheumatoid arthritis.   The cause may also be unknown.   SIGNS AND SYMPTOMS   The first sign of elbow bursitis is usually swelling over the tip of your elbow. This can grow to be the size of a golf ball. This may start suddenly or develop gradually. You may also have:   Pain when bending or leaning on your elbow.   Restricted movement of your elbow.   If your bursitis is caused by an infection, symptoms may also include:   Redness, warmth, and tenderness of the elbow.   Drainage of pus from the swollen area over your elbow, if the skin breaks open.  DIAGNOSIS   Your health care provider may be able to diagnose elbow bursitis based on your signs and symptoms, especially if you have recently been injured. Your health care provider will also do a physical exam. This may include:   X-rays to look for a bone spur or a bone fracture.   Draining fluid from the bursa to test it for infection.   Blood tests to rule out gout or rheumatoid arthritis.  TREATMENT   Treatment for elbow bursitis depends on the cause. Treatment may include:   Medicines. These may include:    Over-the-counter medicines to relieve pain and inflammation.     Antibiotic medicines to fight infection.    Injections of anti-inflammatory medicines (steroids).   Wrapping your elbow with a bandage.   Draining fluid from the bursa.   Wearing elbow pads.   If your bursitis does not get better with treatment, surgery may be needed to remove the bursa.   HOME CARE INSTRUCTIONS    Take medicines only as directed by your health care provider.   If you were prescribed an antibiotic medicine, finish all of it even if you start to feel better.   If your bursitis is caused by an injury, rest your elbow and wear your bandage as directed by your health care provider. You may alsoapply ice to the injured area as directed by your health care provider:   Put ice in a plastic bag.   Place a towel between your skin and the bag.   Leave the ice on for 20 minutes, 2-3 times per day.   Avoid any activities that cause elbow pain.   Use elbow pads or elbow wraps to cushion your elbow.  SEEK MEDICAL CARE IF:   You have a fever.    Your symptoms do not get better with treatment.   Your pain or swelling gets worse.   Your elbow pain or swelling goes away and then returns.   You have   drainage of pus from the swollen area over your elbow.     This information is not intended to replace advice given to you by your health care provider. Make sure you discuss any questions you have with your health care provider.     Document Released: 10/24/2006 Document Revised: 10/15/2014 Document Reviewed: 06/02/2014  [employer]vier Interactive Patient Education 2016 [employer]
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
## 2015-10-24 ENCOUNTER — Ambulatory Visit (INDEPENDENT_AMBULATORY_CARE_PROVIDER_SITE_OTHER): Payer: Federal, State, Local not specified - PPO | Admitting: Family Medicine

## 2015-10-24 ENCOUNTER — Encounter: Payer: Self-pay | Admitting: Family Medicine

## 2015-10-24 VITALS — BP 102/64 | HR 68 | Temp 97.9°F | Resp 16 | Wt 164.0 lb

## 2015-10-24 DIAGNOSIS — I201 Angina pectoris with documented spasm: Secondary | ICD-10-CM

## 2015-10-24 DIAGNOSIS — F419 Anxiety disorder, unspecified: Secondary | ICD-10-CM

## 2015-10-24 DIAGNOSIS — H6642 Suppurative otitis media, unspecified, left ear: Secondary | ICD-10-CM | POA: Diagnosis not present

## 2015-10-24 MED ORDER — NITROGLYCERIN 400 MCG/SPRAY TL AERS: INHALATION_SPRAY | Status: DC

## 2015-10-24 MED ORDER — ALPRAZOLAM 0.5 MG PO TABS: 1.5000 mg | ORAL_TABLET | Freq: Every day | ORAL | Status: DC

## 2015-10-24 MED ORDER — CEFDINIR 300 MG PO CAPS: 300.0000 mg | ORAL_CAPSULE | Freq: Two times a day (BID) | ORAL | Status: DC

## 2015-10-24 NOTE — Progress Notes (Signed)
Subjective:    Patient ID: Elizabeth [name], female    DOB: 11-10-1950, 65 y.o.   MRN: [id number]  Otalgia  There is pain in the left ear. This is a new problem. The current episode started in the past 7 days (x 4-5 days). The problem has been gradually worsening. There has been no fever. The pain is at a severity of 2/10. The pain is mild. Associated symptoms include rhinorrhea. Pertinent negatives include no abdominal pain, coughing, ear discharge, headaches, hearing loss, sore throat or vomiting. She has tried nothing for the symptoms.      Review of Systems  HENT: Positive for ear pain and rhinorrhea. Negative for ear discharge, hearing loss and sore throat.   Respiratory: Negative for cough.   Gastrointestinal: Negative for vomiting and abdominal pain.  Neurological: Negative for headaches.   BP 102/64 mmHg  Pulse 68  Temp(Src) 97.9 F (36.6 C) (Oral)  Resp 16  Wt 164 lb (74.39 kg)   Patient Active Problem List   Diagnosis Date Noted  . Anxiety 09/07/2015  . Angina pectoris with documented spasm ([place]) 09/07/2015  . Clinical depression 09/07/2015  . Elevated WBC count 09/07/2015  . H/O total hip arthroplasty 09/07/2015  . HLD (hyperlipidemia) 09/07/2015  . Headache, migraine 09/07/2015  . Arthritis, degenerative 09/07/2015  . Arthropathy of temporomandibular joint 09/07/2015  . Olecranon bursitis 09/07/2015  . Restless leg 07/08/2015  . Primary osteoarthritis of right hip 01/19/2015  . DJD (degenerative joint disease) 01/18/2015   Past Medical History  Diagnosis Date  . GERD (gastroesophageal reflux disease)   . Degenerative joint disease (DJD) of hip   . Constipation due to opioid therapy   . Vaso-vagal reaction     after back surgery  . Restless leg syndrome     takes Mirapex and xanax  . Osteopenia   . SUI (stress urinary incontinence, female)    Current Outpatient Prescriptions on File Prior to Visit  Medication Sig  . ALPRAZolam (XANAX) 0.5  MG tablet Take 3 tablets (1.5 mg total) by mouth at bedtime.  Marland Kitchen [place] by mouth.  . Multiple Vitamins-Minerals (MULTIVITAMIN WITH MINERALS) tablet Take 1 tablet by mouth daily.  . Nitroglycerin (NITROMIST) 400 MCG/SPRAY AERS NITROMIST, 400MCG/SPRAY (Translingual Aerosol Solution)  1 (one) Garment/textile technologist Soln one spray every 5 minutes as needed, max 3 doses for 0 days  Quantity: 1;  Refills: 2   Ordered :08-May-2013  Wynell Balloon ;  Started 08-May-2013 Active Comments: Medication taken as needed.   Marland Kitchen omeprazole (PRILOSEC) 20 MG capsule Take 20 mg by mouth daily.  . pramipexole (MIRAPEX) 1 MG tablet take 1 tablet by mouth at bedtime  . valACYclovir (VALTREX) 500 MG tablet Take by mouth. Reported on 10/24/2015   No current facility-administered medications on file prior to visit.   Allergies  Allergen Reactions  . Naproxen Hives  . Penicillins Hives  . Zoloft [Sertraline Hcl] Hives  . Latex Itching and Rash   Past Surgical History  Procedure Laterality Date  . Back surgery    . Laminectomy  2006  . Tonsillectomy      age 84  . Appendectomy  1963  . Breast surgery    . Breast lumpectomy       X 3; benign  . Eye surgery      Lasik  . Rotator cuff repair Bilateral   . Hysterotomy    . Tubal ligation    . Foot surgery Bilateral   .  Total hip arthroplasty Right 01/18/2015    Procedure: RIGHT TOTAL HIP ARTHROPLASTY ANTERIOR APPROACH;  Surgeon: Renette Butters, MD;  Location: [facility];  Service: Orthopedics;  Laterality: Right;   Social History   Social History  . Marital Status: Married    Spouse Name: N/A  . Number of Children: N/A  . Years of Education: N/A   Occupational History  . Not on file.   Social History Main Topics  . Smoking status: Former Smoker -- 0.50 packs/day for 6 years    Types: Cigarettes    Quit date: 01/07/2012  . Smokeless tobacco: Never Used     Comment: vapors occasionally  . Alcohol Use: Not on file      Comment: red wine  . Drug Use: No  . Sexual Activity: Yes    Birth Control/ Protection: Post-menopausal   Other Topics Concern  . Not on file   Social History Narrative   No family history on file.     Objective:   Physical Exam  Constitutional: She is oriented to person, place, and time. She appears well-developed and well-nourished.  HENT:  Left Ear: Tympanic membrane is erythematous and bulging.  Nose: Nose normal.  Cerumen bilateral ears  Cardiovascular: Normal rate and regular rhythm.   Pulmonary/Chest: Effort normal and breath sounds normal.  Neurological: She is alert and oriented to person, place, and time.  Psychiatric: She has a normal mood and affect. Her behavior is normal.   BP 102/64 mmHg  Pulse 68  Temp(Src) 97.9 F (36.6 C) (Oral)  Resp 16  Wt 164 lb (74.39 kg)     Assessment & Plan:  1. Angina pectoris with documented spasm ([facility]) Stable. Refill as below. - Nitroglycerin (NITROMIST) 400 MCG/SPRAY AERS; NITROMIST, 400MCG/SPRAY (Translingual Aerosol Solution)  1 (one) Higher education careers adviser one spray every 5 minutes as needed, max 3 doses for 0 days  Quantity: 1;  Refills: 2   2. Anxiety Stable. Refill as below. - ALPRAZolam (XANAX) 0.5 MG tablet; Take 3 tablets (1.5 mg total) by mouth at bedtime.  Dispense: 90 tablet; Refill: 5  3. Suppurative otitis media of left ear, unspecified chronicity, unspecified otitis media location New problem. Pt has tried Augmentin in the past, without problems. Start [place] as below. Call if problem does not improve.   - cefdinir (OMNICEF) 300 MG capsule; Take 1 capsule (300 mg total) by mouth 2 (two) times daily.  Dispense: 14 capsule; Refill: 0   Patient seen and examined by Jerrell Belfast, MD, and note scribed by Renaldo Fiddler, CMA. I have reviewed the document for accuracy and completeness and I agree with above. Jerrell Belfast, MD   Margarita Rana, MD

## 2015-11-09 ENCOUNTER — Encounter: Payer: Self-pay | Admitting: Family Medicine

## 2015-11-16 ENCOUNTER — Telehealth: Payer: Self-pay | Admitting: Family Medicine

## 2015-11-16 NOTE — Telephone Encounter (Signed)
Pt is requesting a letter from Dr Venia Minks stating on that due to a heart issues pt was advised not to travel until a heart work up was completed.  Pt was suppose to leave in 09/30/2015 and canceled flight 09/22/2015 but to get a refund she will need a letter from her doctor.  [phone]/[phone]

## 2015-11-16 NOTE — Telephone Encounter (Signed)
Can you write this letter. Thanks.

## 2015-11-23 ENCOUNTER — Other Ambulatory Visit: Payer: Self-pay | Admitting: Family Medicine

## 2015-11-23 DIAGNOSIS — F419 Anxiety disorder, unspecified: Secondary | ICD-10-CM

## 2015-11-24 NOTE — Telephone Encounter (Signed)
Printed, please fax or call in to pharmacy. Thank you.   

## 2015-12-20 ENCOUNTER — Ambulatory Visit (INDEPENDENT_AMBULATORY_CARE_PROVIDER_SITE_OTHER): Payer: Federal, State, Local not specified - PPO | Admitting: Family Medicine

## 2015-12-20 ENCOUNTER — Telehealth: Payer: Self-pay

## 2015-12-20 ENCOUNTER — Encounter: Payer: Self-pay | Admitting: Family Medicine

## 2015-12-20 VITALS — BP 110/60 | HR 62 | Temp 97.9°F | Resp 16 | Ht 63.0 in | Wt 166.0 lb

## 2015-12-20 DIAGNOSIS — Z0000 Encounter for general adult medical examination without abnormal findings: Secondary | ICD-10-CM

## 2015-12-20 DIAGNOSIS — Z124 Encounter for screening for malignant neoplasm of cervix: Secondary | ICD-10-CM

## 2015-12-20 DIAGNOSIS — E785 Hyperlipidemia, unspecified: Secondary | ICD-10-CM

## 2015-12-20 DIAGNOSIS — D72829 Elevated white blood cell count, unspecified: Secondary | ICD-10-CM | POA: Diagnosis not present

## 2015-12-20 DIAGNOSIS — Z1239 Encounter for other screening for malignant neoplasm of breast: Secondary | ICD-10-CM | POA: Diagnosis not present

## 2015-12-20 LAB — POCT URINALYSIS DIPSTICK
Bilirubin, UA: NEGATIVE
GLUCOSE UA: NEGATIVE
Ketones, UA: NEGATIVE
LEUKOCYTES UA: NEGATIVE
NITRITE UA: NEGATIVE
Protein, UA: NEGATIVE
RBC UA: NEGATIVE
Spec Grav, UA: 1.015
UROBILINOGEN UA: 0.2
pH, UA: 7.5

## 2015-12-20 NOTE — Progress Notes (Signed)
Patient ID: [name], female   DOB: Nov 30, 1950, 65 y.o.   MRN: [id number]       Patient: Elizabeth [name], Female    DOB: April 27, 1951, 65 y.o.   MRN: [id number] Visit Date: 12/20/2015  Today's Provider: Margarita Rana, MD   Chief Complaint  Patient presents with  . Annual Exam   Subjective:    Annual physical exam Elizabeth [name] is a 65 y.o. female who presents today for health maintenance and complete physical. She feels well. She reports exercising walk a mile a day. She reports she is sleeping well. 09/27/14 CPE 07/14/08 Pap-neg 09/27/14 Mammogram-BI-RADS 1 05/05/13 Colonoscopy-diverticulosis -----------------------------------------------------------------   Review of Systems  Constitutional: Negative.   HENT: Negative.   Eyes: Negative.   Respiratory: Negative.   Cardiovascular: Negative.   Gastrointestinal: Negative.   Endocrine: Negative.   Genitourinary: Negative.   Musculoskeletal: Negative.   Skin: Negative.   Allergic/Immunologic: Negative.   Neurological: Negative.   Hematological: Negative.   Psychiatric/Behavioral: Negative.     Social History      She  reports that she quit smoking about 3 years ago. Her smoking use included Cigarettes. She has a 3 pack-year smoking history. She has never used smokeless tobacco. She reports that she does not use illicit drugs.       Social History   Social History  . Marital Status: Married    Spouse Name: N/A  . Number of Children: N/A  . Years of Education: N/A   Social History Main Topics  . Smoking status: Former Smoker -- 0.50 packs/day for 6 years    Types: Cigarettes    Quit date: 01/07/2012  . Smokeless tobacco: Never Used     Comment: vapors occasionally  . Alcohol Use: None     Comment: red wine  . Drug Use: No  . Sexual Activity: Yes    Birth Control/ Protection: Post-menopausal   Other Topics Concern  . None   Social History Narrative    Past  Medical History  Diagnosis Date  . GERD (gastroesophageal reflux disease)   . Degenerative joint disease (DJD) of hip   . Constipation due to opioid therapy   . Vaso-vagal reaction     after back surgery  . Restless leg syndrome     takes Mirapex and xanax  . Osteopenia   . SUI (stress urinary incontinence, female)      Patient Active Problem List   Diagnosis Date Noted  . Anxiety 09/07/2015  . Angina pectoris with documented spasm ([place]) 09/07/2015  . Clinical depression 09/07/2015  . Elevated WBC count 09/07/2015  . H/O total hip arthroplasty 09/07/2015  . HLD (hyperlipidemia) 09/07/2015  . Headache, migraine 09/07/2015  . Arthritis, degenerative 09/07/2015  . Arthropathy of temporomandibular joint 09/07/2015  . Olecranon bursitis 09/07/2015  . Restless leg 07/08/2015  . Primary osteoarthritis of right hip 01/19/2015  . DJD (degenerative joint disease) 01/18/2015  . Pain in shoulder 10/05/2013    Past Surgical History  Procedure Laterality Date  . Back surgery    . Laminectomy  2006  . Tonsillectomy      age 51  . Appendectomy  1963  . Breast surgery    . Breast lumpectomy       X 3; benign  . Eye surgery      Lasik  . Rotator cuff repair Bilateral   . Hysterotomy    . Tubal ligation    . Foot surgery Bilateral   . Total hip arthroplasty Right 01/18/2015  Procedure: RIGHT TOTAL HIP ARTHROPLASTY ANTERIOR APPROACH;  Surgeon: Renette Butters, MD;  Location: [facility];  Service: Orthopedics;  Laterality: Right;    Family History        Family Status  Relation Status Death Age  . Mother Deceased 50    lung cancer  . Father Deceased 60    murdered  . Brother Alive   . Daughter Alive         Her family history includes Alcohol abuse in her brother and mother; Cancer in her brother, daughter, father, and mother; Diabetes in her father; Migraines in her mother.    Allergies  Allergen Reactions  . Naproxen Hives  . Penicillins Hives  . Zoloft [Sertraline Hcl]  Hives  . Latex Itching and Rash    Previous Medications   ALPRAZOLAM (XANAX) 0.5 MG TABLET    take 3 tablets by mouth at bedtime   [place] by mouth.   MULTIPLE VITAMINS-MINERALS (MULTIVITAMIN WITH MINERALS) TABLET    Take 1 tablet by mouth daily.   NITROGLYCERIN (NITROMIST) 400 MCG/SPRAY AERS    NITROMIST, 400MCG/SPRAY (Translingual Aerosol Solution)  1 (one) Higher education careers adviser one spray every 5 minutes as needed, max 3 doses for 0 days  Quantity: 1;  Refills: 2   Ordered :08-May-2013  Wynell Balloon ;  Started 08-May-2013 Active Comments: Medication taken as needed.   OMEPRAZOLE (PRILOSEC) 20 MG CAPSULE    Take 20 mg by mouth daily.   PRAMIPEXOLE (MIRAPEX) 1 MG TABLET    take 1 tablet by mouth at bedtime   VALACYCLOVIR (VALTREX) 500 MG TABLET    Take by mouth. Reported on 10/24/2015    Patient Care Team: Margarita Rana, MD as PCP - General (Family Medicine)     Objective:   Vitals: BP 110/60 mmHg  Pulse 62  Temp(Src) 97.9 F (36.6 C) (Oral)  Resp 16  Ht '5\' 3"'$  (1.6 m)  Wt 166 lb (75.297 kg)  BMI 29.41 kg/m2  SpO2 98%   Physical Exam  Constitutional: She is oriented to person, place, and time. She appears well-developed and well-nourished.  HENT:  Head: Normocephalic and atraumatic.  Right Ear: Tympanic membrane, external ear and ear canal normal.  Left Ear: Tympanic membrane, external ear and ear canal normal.  Nose: Nose normal.  Mouth/Throat: Uvula is midline, oropharynx is clear and moist and mucous membranes are normal.  Eyes: Conjunctivae, EOM and lids are normal. Pupils are equal, round, and reactive to light.  Neck: Trachea normal and normal range of motion. Neck supple. Carotid bruit is not present. No thyroid mass and no thyromegaly present.  Cardiovascular: Normal rate, regular rhythm and normal heart sounds.   Pulmonary/Chest: Effort normal and breath sounds normal.  Abdominal: Soft. Normal appearance and bowel sounds  are normal. There is no hepatosplenomegaly. There is no tenderness.  Genitourinary: Vagina normal. No breast swelling, tenderness or discharge.  Musculoskeletal: Normal range of motion.  Lymphadenopathy:    She has no cervical adenopathy.    She has no axillary adenopathy.  Neurological: She is alert and oriented to person, place, and time. She has normal strength. No cranial nerve deficit.  Skin: Skin is warm, dry and intact.  Psychiatric: She has a normal mood and affect. Her speech is normal and behavior is normal. Judgment and thought content normal. Cognition and memory are normal.     Depression Screen PHQ 2/9 Scores 12/20/2015  PHQ - 2 Score 0  Assessment & Plan:     Routine Health Maintenance and Physical Exam  Exercise Activities and Dietary recommendations Goals    None      Immunization History  Administered Date(s) Administered  . Tdap 05/18/2009  . Zoster 09/27/2014        1. Annual physical exam Stable. Patient advised to continue eating healthy and exercise daily.  Will follow up with Dr.  Caryn Section.   - POCT urinalysis dipstick  2. Cervical cancer screening F/U pending lab report. - Pap IG and HPV (high risk) DNA detection  3. Breast cancer screening - MM DIGITAL SCREENING BILATERAL; Future  4. HLD (hyperlipidemia) - Comprehensive metabolic panel - Lipid Panel With LDL/HDL Ratio - TSH  5. Elevated WBC count - CBC with Differential/Platelet     Patient seen and examined by Dr. Jerrell Belfast, and note scribed by Philbert Riser. Dimas, CMA.  I have reviewed the document for accuracy and completeness and I agree with above. Jerrell Belfast, MD   Margarita Rana, MD    --------------------------------------------------------------------

## 2015-12-21 LAB — CBC WITH DIFFERENTIAL/PLATELET
BASOS: 0 %
Basophils Absolute: 0 10*3/uL (ref 0.0–0.2)
EOS (ABSOLUTE): 0.1 10*3/uL (ref 0.0–0.4)
EOS: 2 %
HEMATOCRIT: 41.6 % (ref 34.0–46.6)
HEMOGLOBIN: 13.6 g/dL (ref 11.1–15.9)
IMMATURE GRANS (ABS): 0 10*3/uL (ref 0.0–0.1)
Immature Granulocytes: 0 %
LYMPHS ABS: 2.3 10*3/uL (ref 0.7–3.1)
LYMPHS: 34 %
MCH: 29.2 pg (ref 26.6–33.0)
MCHC: 32.7 g/dL (ref 31.5–35.7)
MCV: 90 fL (ref 79–97)
MONOCYTES: 6 %
Monocytes Absolute: 0.4 10*3/uL (ref 0.1–0.9)
NEUTROS ABS: 3.9 10*3/uL (ref 1.4–7.0)
Neutrophils: 58 %
Platelets: 282 10*3/uL (ref 150–379)
RBC: 4.65 x10E6/uL (ref 3.77–5.28)
RDW: 14.7 % (ref 12.3–15.4)
WBC: 6.7 10*3/uL (ref 3.4–10.8)

## 2015-12-21 LAB — LIPID PANEL WITH LDL/HDL RATIO
CHOLESTEROL TOTAL: 265 mg/dL — AB (ref 100–199)
HDL: 80 mg/dL (ref >39)
LDL CALC: 164 mg/dL — AB (ref 0–99)
LDl/HDL Ratio: 2.1 ratio units (ref 0.0–3.2)
Triglycerides: 107 mg/dL (ref 0–149)
VLDL CHOLESTEROL CAL: 21 mg/dL (ref 5–40)

## 2015-12-21 LAB — COMPREHENSIVE METABOLIC PANEL
ALBUMIN: 4.6 g/dL (ref 3.6–4.8)
ALK PHOS: 83 IU/L (ref 39–117)
ALT: 16 IU/L (ref 0–32)
AST: 12 IU/L (ref 0–40)
Albumin/Globulin Ratio: 2 (ref 1.2–2.2)
BILIRUBIN TOTAL: 0.3 mg/dL (ref 0.0–1.2)
BUN / CREAT RATIO: 21 (ref 11–26)
BUN: 15 mg/dL (ref 8–27)
CHLORIDE: 103 mmol/L (ref 96–106)
CO2: 24 mmol/L (ref 18–29)
CREATININE: 0.7 mg/dL (ref 0.57–1.00)
Calcium: 10 mg/dL (ref 8.7–10.3)
GFR calc non Af Amer: 93 mL/min/{1.73_m2} (ref >59)
GFR, EST AFRICAN AMERICAN: 107 mL/min/{1.73_m2} (ref >59)
GLOBULIN, TOTAL: 2.3 g/dL (ref 1.5–4.5)
Glucose: 98 mg/dL (ref 65–99)
Potassium: 5.5 mmol/L — ABNORMAL HIGH (ref 3.5–5.2)
SODIUM: 143 mmol/L (ref 134–144)
TOTAL PROTEIN: 6.9 g/dL (ref 6.0–8.5)

## 2015-12-21 LAB — TSH: TSH: 1.03 u[IU]/mL (ref 0.450–4.500)

## 2015-12-21 NOTE — Telephone Encounter (Signed)
Open in error. sd

## 2015-12-22 ENCOUNTER — Telehealth: Payer: Self-pay

## 2015-12-22 LAB — PAP IG AND HPV HIGH-RISK
HPV, HIGH-RISK: NEGATIVE
PAP Smear Comment: 0

## 2015-12-22 NOTE — Telephone Encounter (Signed)
-----   Message from Margarita Rana, MD sent at 12/21/2015  4:13 PM EDT ----- Labs stable. Cholesterol is elevated but has such high HDL is ok .  10 year risk of heart disease from blockages is low at 3 percent. Please clarify that nitro is for spasm and not plaque build up.   Also, potassium mildly elevated. Stay hydrated and recheck in 4 weeks.  Can come from difficult lab stick sometimes. Thanks.

## 2015-12-22 NOTE — Telephone Encounter (Signed)
Pt advised.  She reports her Nitro is for Spasm. She will call back in four weeks to get a lab sheet.   Thanks,   -Mickel Baas

## 2015-12-23 ENCOUNTER — Telehealth: Payer: Self-pay

## 2015-12-23 NOTE — Telephone Encounter (Signed)
Patient advised as below.  

## 2015-12-23 NOTE — Telephone Encounter (Signed)
-----   Message from Margarita Rana, MD sent at 12/22/2015  8:23 PM EDT ----- Pap is normal. Please notify patient.   Thanks.

## 2015-12-28 ENCOUNTER — Other Ambulatory Visit: Payer: Self-pay | Admitting: Family Medicine

## 2015-12-28 DIAGNOSIS — F419 Anxiety disorder, unspecified: Secondary | ICD-10-CM

## 2015-12-28 MED ORDER — ALPRAZOLAM 0.5 MG PO TABS: 1.5000 mg | ORAL_TABLET | Freq: Every day | ORAL | Status: DC

## 2015-12-28 NOTE — Telephone Encounter (Signed)
Pt needs refill on ALPRAZolam (XANAX) 0.5 MG tablet  She said [facility] [address] told her that her Dr. Lynnda Child not refill.  Her call back is [phone]  Thanks Con Memos

## 2015-12-28 NOTE — Telephone Encounter (Signed)
Printed, please fax or call in to pharmacy. Thank you.   

## 2016-01-04 ENCOUNTER — Other Ambulatory Visit: Payer: Self-pay | Admitting: Family Medicine

## 2016-01-04 DIAGNOSIS — G2581 Restless legs syndrome: Secondary | ICD-10-CM

## 2016-01-04 DIAGNOSIS — G43809 Other migraine, not intractable, without status migrainosus: Secondary | ICD-10-CM

## 2016-01-16 ENCOUNTER — Ambulatory Visit
Admission: RE | Admit: 2016-01-16 | Discharge: 2016-01-16 | Disposition: A | Discharge status: Home / Self Care | Payer: Federal, State, Local not specified - PPO | Source: Ambulatory Visit | Attending: Family Medicine | Admitting: Family Medicine

## 2016-01-16 DIAGNOSIS — Z1231 Encounter for screening mammogram for malignant neoplasm of breast: Secondary | ICD-10-CM | POA: Insufficient documentation

## 2016-01-16 DIAGNOSIS — Z1239 Encounter for other screening for malignant neoplasm of breast: Secondary | ICD-10-CM

## 2016-05-04 ENCOUNTER — Other Ambulatory Visit: Payer: Self-pay | Admitting: *Deleted

## 2016-05-04 DIAGNOSIS — F419 Anxiety disorder, unspecified: Secondary | ICD-10-CM

## 2016-05-04 DIAGNOSIS — G2581 Restless legs syndrome: Secondary | ICD-10-CM

## 2016-05-04 NOTE — Telephone Encounter (Signed)
Please review when you return-aa

## 2016-05-04 NOTE — Telephone Encounter (Signed)
Patient is requesting refills for alprazolam 0.5 mg and pramipexole 1 mg. Patient is in no rush for rx's, stated that she can wait until Caryn Section returns.

## 2016-05-07 NOTE — Telephone Encounter (Signed)
Looks like patient called too soon for a refill on these medications. Please call and advise her that she should not be due for a refill until mid-late August.

## 2016-05-07 NOTE — Telephone Encounter (Signed)
Patient advised. Patient states she knew that she was not due for a refill right now. She just wanted to get her medications switched over to Dr. Caryn Section (patient was formerly a Public affairs consultant patient), so that when it was time for a refill she would be able to get her medications with no problems.

## 2016-05-09 MED ORDER — PRAMIPEXOLE DIHYDROCHLORIDE 1 MG PO TABS: 1.0000 mg | ORAL_TABLET | Freq: Every day | ORAL | 5 refills | Status: DC

## 2016-05-09 MED ORDER — ALPRAZOLAM 0.5 MG PO TABS: 1.5000 mg | ORAL_TABLET | Freq: Every day | ORAL | 5 refills | Status: DC

## 2016-05-09 NOTE — Telephone Encounter (Signed)
Please call in alprazolam.  

## 2016-05-09 NOTE — Telephone Encounter (Signed)
Rx called in to pharmacy. 

## 2016-06-06 ENCOUNTER — Ambulatory Visit (INDEPENDENT_AMBULATORY_CARE_PROVIDER_SITE_OTHER): Payer: Federal, State, Local not specified - PPO | Admitting: Family Medicine

## 2016-06-06 ENCOUNTER — Encounter: Payer: Self-pay | Admitting: Family Medicine

## 2016-06-06 VITALS — BP 120/60 | HR 61 | Temp 98.1°F | Resp 16 | Wt 153.0 lb

## 2016-06-06 DIAGNOSIS — H6121 Impacted cerumen, right ear: Secondary | ICD-10-CM

## 2016-06-06 DIAGNOSIS — J0110 Acute frontal sinusitis, unspecified: Secondary | ICD-10-CM | POA: Diagnosis not present

## 2016-06-06 MED ORDER — AZITHROMYCIN 250 MG PO TABS: ORAL_TABLET | ORAL | 0 refills | Status: AC

## 2016-06-06 NOTE — Progress Notes (Signed)
Patient: Elizabeth [name] Female    DOB: [birth year]   65 y.o.   MRN: [id number] Visit Date: 06/06/2016  Today's Provider: Lelon Huh, MD   Chief Complaint  Patient presents with  . Cough   Subjective:    Cough for 2 days and some sinus pressure. Cough is productive with yellow mucus. Also has right ear congestion and pain. Ear congestion may be due to getting water in her ear 6 weeks ago. Also feels off balance.   Cough  This is a new problem. The current episode started yesterday. The problem has been gradually worsening. The cough is productive of sputum. Associated symptoms include ear congestion and headaches. Pertinent negatives include no chest pain, chills, ear pain, fever, heartburn, hemoptysis, myalgias, nasal congestion, postnasal drip, rash, rhinorrhea, sore throat, shortness of breath, sweats, weight loss or wheezing. The symptoms are aggravated by other (talking). Treatments tried: vicks 44. The treatment provided moderate relief. Her past medical history is significant for bronchiectasis and pneumonia. There is no history of asthma, bronchitis, COPD, emphysema or environmental allergies.   Also complains of right ear discomfort since going to beach about a month ago. Feels like there is something in ear canal all the time.    Allergies  Allergen Reactions  . Naproxen Hives  . Penicillins Hives  . Zoloft [Sertraline Hcl] Hives  . Latex Itching and Rash   Current Meds  Medication Sig  . ALPRAZolam (XANAX) 0.5 MG tablet Take 3 tablets (1.5 mg total) by mouth at bedtime.  Marland Kitchen [place] by mouth.  . Multiple Vitamins-Minerals (MULTIVITAMIN WITH MINERALS) tablet Take 1 tablet by mouth daily.  . Nitroglycerin (NITROMIST) 400 MCG/SPRAY AERS NITROMIST, 400MCG/SPRAY (Translingual Aerosol Solution)  1 (one) Garment/textile technologist Soln one spray every 5 minutes as needed, max 3 doses for 0 days  Quantity: 1;  Refills: 2   Ordered  :08-May-2013  Wynell Balloon ;  Started 08-May-2013 Active Comments: Medication taken as needed.  Marland Kitchen omeprazole (PRILOSEC) 20 MG capsule Take 20 mg by mouth daily.  . pramipexole (MIRAPEX) 1 MG tablet Take 1 tablet (1 mg total) by mouth at bedtime.  . valACYclovir (VALTREX) 500 MG tablet Take by mouth. Reported on 10/24/2015    Review of Systems  Constitutional: Negative for appetite change, chills, fatigue, fever and weight loss.  HENT: Positive for congestion and sinus pressure. Negative for ear pain, postnasal drip, rhinorrhea and sore throat.   Respiratory: Positive for cough. Negative for hemoptysis, chest tightness, shortness of breath and wheezing.   Cardiovascular: Negative for chest pain and palpitations.  Gastrointestinal: Negative for abdominal pain, heartburn, nausea and vomiting.  Musculoskeletal: Negative for myalgias.  Skin: Negative for rash.  Allergic/Immunologic: Negative for environmental allergies.  Neurological: Positive for headaches. Negative for dizziness and weakness.    Social History  Substance Use Topics  . Smoking status: Former Smoker    Packs/day: 0.50    Years: 6.00    Types: Cigarettes    Quit date: 01/07/2012  . Smokeless tobacco: Never Used     Comment: vapors occasionally  . Alcohol use Not on file     Comment: red wine   Objective:   BP 120/60 (BP Location: Left Arm, Patient Position: Sitting, Cuff Size: Normal)   Pulse 61   Temp 98.1 F (36.7 C) (Oral)   Resp 16   Wt 153 lb (69.4 kg)   SpO2 96%   BMI 27.10 kg/m  Physical Exam  General Appearance:    Alert, cooperative, no distress  HENT:   bilateral TM normal without fluid or infection, neck without nodes, sinuses tender and nasal mucosa pale and congested. Right ear canal obstructed.   Eyes:    PERRL, conjunctiva/corneas clear, EOM's intact       Lungs:     Clear to auscultation bilaterally, respirations unlabored  Heart:    Regular rate and rhythm  Neurologic:   Awake, alert,  oriented x 3. No apparent focal neurological           defect.           Assessment & Plan:     1. Acute frontal sinusitis, recurrence not specified  - azithromycin (ZITHROMAX) 250 MG tablet; 2 by mouth today, then 1 daily for 4 days  Dispense: 6 tablet; Refill: 0  2. Cerumen impaction, right After soaking with Debrox, ear canal was irrigated with water until clear. Patient tolerated procedure well.         Lelon Huh, MD  [facility]

## 2016-09-24 ENCOUNTER — Telehealth: Payer: Self-pay | Admitting: Family Medicine

## 2016-09-24 NOTE — Telephone Encounter (Signed)
Pharmacy aware to fill medication on 09/27/16

## 2016-09-24 NOTE — Telephone Encounter (Signed)
Pt stated she contacted [facility] to see if she could get her ALPRAZolam Duanne Moron) 0.5 MG tablet 5 days early since she is leaving to go out of town on 09/28/16 for Christmas and her medication is due on 10/03/16. Pt stated that [facility] advised pt that our office would have to contact them to allow the early refill. Pt stated that she will be out of town until 10/10/16. Please advise. Thanks TNP

## 2016-09-24 NOTE — Telephone Encounter (Signed)
Please advise pharmacy they may fill prescription on 09/27/2016

## 2016-10-19 ENCOUNTER — Telehealth: Payer: Self-pay | Admitting: Family Medicine

## 2016-10-19 NOTE — Telephone Encounter (Signed)
Returned call. Patient stated that she has cough and runny nose. Patient stated that she has no sore throat, no fever. Advised pt cold symptoms can last up to 10 days or a little longer. Advised if cough lasts longer then 10 days she should call office and make ov appt.

## 2016-10-19 NOTE — Telephone Encounter (Signed)
Pt stated that she has been coughing and has some congestion that has been going on for about 5 days. Pt request a nurse return her call to find out about how long these symptoms have been lasting so she can decide if she should schedule an OV or give it more time to run its course. Please advise. Thanks TNP

## 2016-10-23 ENCOUNTER — Ambulatory Visit
Admission: RE | Admit: 2016-10-23 | Discharge: 2016-10-23 | Disposition: A | Discharge status: Home / Self Care | Payer: Federal, State, Local not specified - PPO | Source: Ambulatory Visit | Attending: Family Medicine | Admitting: Family Medicine

## 2016-10-23 ENCOUNTER — Encounter: Payer: Self-pay | Admitting: Family Medicine

## 2016-10-23 ENCOUNTER — Ambulatory Visit (INDEPENDENT_AMBULATORY_CARE_PROVIDER_SITE_OTHER): Payer: Federal, State, Local not specified - PPO | Admitting: Family Medicine

## 2016-10-23 VITALS — BP 122/56 | HR 72 | Temp 97.6°F | Resp 16 | Wt 160.2 lb

## 2016-10-23 DIAGNOSIS — J40 Bronchitis, not specified as acute or chronic: Secondary | ICD-10-CM

## 2016-10-23 DIAGNOSIS — R05 Cough: Secondary | ICD-10-CM | POA: Diagnosis present

## 2016-10-23 MED ORDER — DOXYCYCLINE HYCLATE 100 MG PO TABS: 100.0000 mg | ORAL_TABLET | Freq: Two times a day (BID) | ORAL | 0 refills | Status: DC

## 2016-10-23 NOTE — Progress Notes (Signed)
Subjective:     Patient ID: Elizabeth [name], female   DOB: 05/23/1951, 66 y.o.   MRN: [id number]  HPI  Chief Complaint  Patient presents with  . Cough    Patient comes in office today with concerns of cough and congestion for the past 10-12 days. Patient reports wheezing and rattling in chest when coughing and sinus pressure. Patient states that she has taken otc Vicks Severe Cold  Reports clear sinus congestion and sputum. No paroxysms of cough reported. No flu shot this season.   Review of Systems     Objective:   Physical Exam  Constitutional: She appears well-developed and well-nourished. No distress.  Ears: L T.M intact without inflammation; R TM obscured by cerumen Throat: tonsils absent Neck: no cervical adenopathy Lungs: scattered expiratory wheezes with right basilar crackles     Assessment:    1. Bronchitis: r/o pneumonia - doxycycline (VIBRA-TABS) 100 MG tablet; Take 1 tablet (100 mg total) by mouth 2 (two) times daily.  Dispense: 20 tablet; Refill: 0 - DG Chest 2 View; Future    Plan:    Discussed use of Delsym.

## 2016-10-23 NOTE — Patient Instructions (Signed)
May try Delsym for cough.

## 2016-10-26 ENCOUNTER — Telehealth: Payer: Self-pay

## 2016-10-26 NOTE — Telephone Encounter (Signed)
Patient has been advised. KW 

## 2016-10-26 NOTE — Telephone Encounter (Signed)
-----   Message from Carmon Ginsberg, [place] sent at 10/25/2016  6:29 AM EST ----- No pneumonia

## 2016-11-11 ENCOUNTER — Other Ambulatory Visit: Payer: Self-pay | Admitting: Family Medicine

## 2016-11-11 DIAGNOSIS — G2581 Restless legs syndrome: Secondary | ICD-10-CM

## 2016-11-12 ENCOUNTER — Other Ambulatory Visit: Payer: Self-pay | Admitting: Family Medicine

## 2016-11-12 DIAGNOSIS — I201 Angina pectoris with documented spasm: Secondary | ICD-10-CM

## 2016-11-12 MED ORDER — VALACYCLOVIR HCL 500 MG PO TABS: 500.0000 mg | ORAL_TABLET | Freq: Every day | ORAL | 11 refills | Status: DC

## 2016-11-12 MED ORDER — NITROGLYCERIN 400 MCG/SPRAY TL AERS
INHALATION_SPRAY | 2 refills | Status: DC
Start: 2016-11-12 — End: 2016-11-13

## 2016-11-12 NOTE — Telephone Encounter (Signed)
Patient is requesting another refill on her valACYclovir (VALTREX) 500 MG tablet

## 2016-11-13 ENCOUNTER — Other Ambulatory Visit: Payer: Self-pay | Admitting: Family Medicine

## 2016-11-13 DIAGNOSIS — I201 Angina pectoris with documented spasm: Secondary | ICD-10-CM

## 2016-11-13 MED ORDER — NITROGLYCERIN 400 MCG/SPRAY TL AERS: INHALATION_SPRAY | 2 refills | Status: DC

## 2016-12-02 ENCOUNTER — Other Ambulatory Visit: Payer: Self-pay | Admitting: Family Medicine

## 2016-12-02 DIAGNOSIS — F419 Anxiety disorder, unspecified: Secondary | ICD-10-CM

## 2016-12-03 NOTE — Telephone Encounter (Signed)
Called into [facility] [place]. Renaldo Fiddler, CMA

## 2016-12-13 ENCOUNTER — Ambulatory Visit: Payer: Self-pay | Admitting: Family Medicine

## 2017-01-17 ENCOUNTER — Ambulatory Visit (INDEPENDENT_AMBULATORY_CARE_PROVIDER_SITE_OTHER): Payer: Federal, State, Local not specified - PPO | Admitting: Pulmonary Disease

## 2017-01-17 ENCOUNTER — Telehealth: Payer: Self-pay | Admitting: Pulmonary Disease

## 2017-01-17 ENCOUNTER — Encounter: Payer: Self-pay | Admitting: Pulmonary Disease

## 2017-01-17 VITALS — BP 124/76 | HR 63 | Wt 163.0 lb

## 2017-01-17 DIAGNOSIS — R911 Solitary pulmonary nodule: Secondary | ICD-10-CM | POA: Diagnosis not present

## 2017-01-17 DIAGNOSIS — J310 Chronic rhinitis: Secondary | ICD-10-CM | POA: Diagnosis not present

## 2017-01-17 DIAGNOSIS — R05 Cough: Secondary | ICD-10-CM | POA: Diagnosis not present

## 2017-01-17 DIAGNOSIS — R059 Cough, unspecified: Secondary | ICD-10-CM

## 2017-01-17 DIAGNOSIS — K219 Gastro-esophageal reflux disease without esophagitis: Secondary | ICD-10-CM | POA: Diagnosis not present

## 2017-01-17 MED ORDER — FLUTICASONE PROPIONATE 50 MCG/ACT NA SUSP: 2.0000 | Freq: Every day | NASAL | 2 refills | Status: DC

## 2017-01-17 MED ORDER — OMEPRAZOLE 20 MG PO CPDR: 20.0000 mg | DELAYED_RELEASE_CAPSULE | Freq: Every day | ORAL | 6 refills | Status: DC

## 2017-01-17 NOTE — Telephone Encounter (Signed)
Pt requesting Prilosec be sent in as an RX. RX sent. Nothing further needed.

## 2017-01-17 NOTE — Telephone Encounter (Signed)
Pt states Dr. Shaune Pascal prescribed Prilosec today at her visit. She asks if this is OTC or an actual rx. Please call and advise.

## 2017-01-17 NOTE — Progress Notes (Signed)
PULMONARY CONSULT NOTE  Requesting MD/Service: Elsie Stain, MD (cardiology) Date of initial consultation: 01/17/17 Reason for consultation: Dyspnea, cough, abnormal CT chest  PT PROFILE: 66 y.o. female  Former smoker (off and on from ages 39 to 55. Less than 1PPD at most). Referred for evaluation of abnormal CT chest. Also reports cough and dyspnea.   HPI:  Somewhat di35 -year-old F who first developed cough in December 2017. Since that time, the cough has been intermittent. At the onset of her cough she felt like she had the "flu". She was treated with antibiotics in January and also took over-the-counter cold and flu remedies. At one point she also had shortness of breath and chest discomfort. She was referred to cardiology. Per her report, they noted a difference in the blood pressure between her two arms and a CT scan of her chest was ordered to evaluate (presumably looking for coarctation of aorta). I do not have access to that CT scan. The report is as follows: CT chest 01/02/17: Nonspecific nodular pulmonary opacities measuring up to approximately 1 cm as noted above.If there are no old outside exams document long term stability, suggest follow-up CT chest in 3 months to document stability/resolution.  On review of systems, she denies shortness of breath, chest pain, orthopnea, PND, lower extremity edema. She does report rhinorrhea and occasional heartburn symptoms she believes that her cough originates in her throat and describes it as a "throat tickle".  As we discussed the possible causes of her chronic cough, I suggested that reflux disease might be a contributor. At that point, she noted that the cough started when she stopped taking omeprazole.   Past Medical History:  Diagnosis Date  . Constipation due to opioid therapy   . Degenerative joint disease (DJD) of hip   . GERD (gastroesophageal reflux disease)   . Osteopenia   . Restless leg syndrome    takes Mirapex and xanax  .  SUI (stress urinary incontinence, female)   . Vaso-vagal reaction    after back surgery    Past Surgical History:  Procedure Laterality Date  . APPENDECTOMY  1963  . BACK SURGERY    . BREAST CYST ASPIRATION Right 1988  . BREAST EXCISIONAL BIOPSY Right 1990   neg  . BREAST LUMPECTOMY      X 3; benign  . BREAST SURGERY    . EYE SURGERY     Lasik  . FOOT SURGERY Bilateral   . HYSTEROTOMY    . LAMINECTOMY  2006  . ROTATOR CUFF REPAIR Bilateral   . TONSILLECTOMY     age 70  . TOTAL HIP ARTHROPLASTY Right 01/18/2015   Procedure: RIGHT TOTAL HIP ARTHROPLASTY ANTERIOR APPROACH;  Surgeon: Renette Butters, MD;  Location: [facility];  Service: Orthopedics;  Laterality: Right;  . TUBAL LIGATION      MEDICATIONS: I have reviewed all medications and confirmed regimen as documented  Social History   Social History  . Marital status: Married    Spouse name: N/A  . Number of children: N/A  . Years of education: N/A   Occupational History  . Not on file.   Social History Main Topics  . Smoking status: Former Smoker    Packs/day: 0.50    Years: 6.00    Types: Cigarettes    Quit date: 01/07/2012  . Smokeless tobacco: Never Used     Comment: vapors occasionally  . Alcohol use Not on file     Comment: red wine  . Drug  use: No  . Sexual activity: Yes    Birth control/ protection: Post-menopausal   Other Topics Concern  . Not on file   Social History Narrative  . No narrative on file    Family History  Problem Relation Age of Onset  . Alcohol abuse Mother   . Cancer Mother     lung  . Migraines Mother   . Cancer Father     prostate  . Diabetes Father   . Alcohol abuse Brother   . Cancer Brother     skin  . Cancer Daughter     breast  . Breast cancer Daughter 40    ROS: No fever, myalgias/arthralgias, unexplained weight loss or weight gain No new focal weakness or sensory deficits No otalgia, hearing loss, visual changes, nasal and sinus symptoms, mouth and throat  problems No neck pain or adenopathy No abdominal pain, N/V/D, diarrhea, change in bowel pattern No dysuria, change in urinary pattern   Vitals:   01/17/17 1022  BP: 124/76  Pulse: 63  SpO2: 99%  Weight: 73.9 kg (163 lb)     EXAM:  Gen: WDWN, No overt respiratory distress HEENT: NCAT, sclera white, oropharynx normal Neck: Supple without LAN, thyromegaly, JVD Lungs: breath sounds Full, percussion normal, no wheezes or other adventitious sounds Cardiovascular: RRR, no murmurs noted Abdomen: Soft, nontender, normal BS Ext: without clubbing, cyanosis, edema Neuro: CNs grossly intact, motor and sensory intact Skin: Limited exam, no lesions noted  DATA:   BMP Latest Ref Rng & Units 12/20/2015 01/07/2015 09/28/2014  Glucose 65 - 99 mg/dL 98 71 -  BUN 8 - 27 mg/dL '15 11 11  '$ Creatinine 0.57 - 1.00 mg/dL 0.70 0.65 0.7  BUN/Creat Ratio 11 - 26 21 - -  Sodium 134 - 144 mmol/L 143 138 141  Potassium 3.5 - 5.2 mmol/L 5.5(H) 4.1 -  Chloride 96 - 106 mmol/L 103 108 -  CO2 18 - 29 mmol/L 24 26 -  Calcium 8.7 - 10.3 mg/dL 10.0 9.4 -    CBC Latest Ref Rng & Units 12/20/2015 01/07/2015 09/28/2014  WBC 3.4 - 10.8 x10E3/uL 6.7 8.2 8.6  Hemoglobin 12.0 - 15.0 g/dL - 14.2 -  Hematocrit 34.0 - 46.6 % 41.6 43.8 -  Platelets 150 - 379 x10E3/uL 282 276 -    CXR (10/23/16):  No acute cardiac or pulmonary disease  CT chest 01/02/17 (report): Nonspecific nodular pulmonary opacities measuring up to approximately 1 cm as noted above.If there are no old outside exams document long term stability, suggest follow-up CT chest in 3 months to document stability/resolution.    IMPRESSION:     ICD-9-CM ICD-10-CM   1. Cough 786.2 R05 Pulmonary Function Test [facility] Only     DG Chest 2 View  2. Laryngopharyngeal reflux (LPR) 478.79 K21.9   3. Chronic rhinitis, unspecified type 472.0 J31.0   4. Lung nodule 793.11 R91.1 DG Chest 2 View    PLAN:  For cough: I suspect this is most likely due to acid reflux  and/or inflammation in the nasal passages (rhinitis) 1) resume omeprazole (Prilosec) 20 mg-take at dinnertime or an hour before bedtime 2) Flonase nasal inhaler-2 sprays per nostril daily. Prescription entered  For lung nodule: This is likely not anything to worry about. Please obtain a copy of the CT scan performed at [facility] and bring it to me when you return. We will review this together and decide if any further evaluation is needed  Follow-up in 6-8 weeks. I have ordered  pulmonary function tests and chest x-ray to be performed prior to that visit   Merton Border, MD PCCM service Mobile [phone] Pager [phone] 01/21/2017

## 2017-01-17 NOTE — Patient Instructions (Addendum)
For cough: I suspect this is most likely due to acid reflux and/or inflammation in the nasal passages (rhinitis) 1) resume omeprazole (Prilosec) 20 mg-take at dinnertime or an hour before bedtime 2) Flonase nasal inhaler-2 sprays per nostril daily. Prescription entered  For lung nodule: This is likely not anything to worry about. Please and a copy of the CT scan performed at [facility] and bring it to me when you return. We will review this together and decide if any further evaluation is needed  Follow-up in 6-8 weeks. I have ordered pulmonary function tests and chest x-ray to be performed prior to that visit

## 2017-01-30 ENCOUNTER — Encounter: Payer: Self-pay | Admitting: Family Medicine

## 2017-01-30 ENCOUNTER — Other Ambulatory Visit: Payer: Self-pay | Admitting: Family Medicine

## 2017-01-30 ENCOUNTER — Ambulatory Visit (INDEPENDENT_AMBULATORY_CARE_PROVIDER_SITE_OTHER): Payer: Federal, State, Local not specified - PPO | Admitting: Family Medicine

## 2017-01-30 VITALS — BP 120/68 | HR 57 | Temp 98.1°F | Resp 16 | Ht 63.0 in | Wt 169.0 lb

## 2017-01-30 DIAGNOSIS — G4701 Insomnia due to medical condition: Secondary | ICD-10-CM | POA: Diagnosis not present

## 2017-01-30 DIAGNOSIS — M1611 Unilateral primary osteoarthritis, right hip: Secondary | ICD-10-CM

## 2017-01-30 DIAGNOSIS — K219 Gastro-esophageal reflux disease without esophagitis: Secondary | ICD-10-CM

## 2017-01-30 DIAGNOSIS — G2581 Restless legs syndrome: Secondary | ICD-10-CM

## 2017-01-30 DIAGNOSIS — M8580 Other specified disorders of bone density and structure, unspecified site: Secondary | ICD-10-CM | POA: Diagnosis not present

## 2017-01-30 DIAGNOSIS — Z0000 Encounter for general adult medical examination without abnormal findings: Secondary | ICD-10-CM

## 2017-01-30 DIAGNOSIS — N3941 Urge incontinence: Secondary | ICD-10-CM

## 2017-01-30 DIAGNOSIS — E785 Hyperlipidemia, unspecified: Secondary | ICD-10-CM

## 2017-01-30 DIAGNOSIS — G4700 Insomnia, unspecified: Secondary | ICD-10-CM | POA: Insufficient documentation

## 2017-01-30 LAB — POCT URINALYSIS DIPSTICK
BILIRUBIN UA: NEGATIVE
Blood, UA: NEGATIVE
GLUCOSE UA: NEGATIVE
Ketones, UA: NEGATIVE
LEUKOCYTES UA: NEGATIVE
NITRITE UA: NEGATIVE
Protein, UA: NEGATIVE
Spec Grav, UA: 1.01 (ref 1.010–1.025)
UROBILINOGEN UA: 0.2 U/dL
pH, UA: 6.5 (ref 5.0–8.0)

## 2017-01-30 NOTE — Progress Notes (Signed)
Patient: Elizabeth [name], Female    DOB: December 09, 1950, 66 y.o.   MRN: [id number] Visit Date: 01/30/2017  Today's Provider: Lelon Huh, MD   Chief Complaint  Patient presents with  . Annual Exam  . Hyperlipidemia  . Anxiety   Subjective:    Annual physical exam Elizabeth [name] is a 66 y.o. female who presents today for health maintenance and complete physical. She feels fairly well. She reports exercising no. She reports she is sleeping well.  ----------------------------------------------------------------    Lipid/Cholesterol, Follow-up:   Last seen for this 13 months ago.  Management since that visit includes; labs checked, no changes.  Last Lipid Panel:    Component Value Date/Time   CHOL 265 (H) 12/20/2015 1122   TRIG 107 12/20/2015 1122   HDL 80 12/20/2015 1122   LDLCALC 164 (H) 12/20/2015 1122    She reports good compliance with treatment. She is not having side effects. none  Wt Readings from Last 3 Encounters:  01/30/17 169 lb (76.7 kg)  01/17/17 163 lb (73.9 kg)  10/23/16 160 lb 3.2 oz (72.7 kg)    ----------------------------------------------------------------    Restless Leg Current treatment-pramipexole and alprazolam at bedtime which she finds to be effective and well tolerated.   Urinary incontinence.  She states that over the last year she has had increasing difficulty controlling her bladder when she coughs or exerts herself. She was put on Flonase by Dr. Alva Garnet due to chronic cough thought secondary to reflux and post nasal drainage. She states cough has improved, but she still uses multiple pads a day due to incontinence.   Review of Systems  Constitutional: Negative.   HENT: Positive for postnasal drip and rhinorrhea.   Eyes: Negative.   Respiratory: Positive for cough.   Cardiovascular: Negative.   Gastrointestinal: Negative.   Endocrine: Negative.   Genitourinary: Negative.   Musculoskeletal:  Positive for arthralgias.  Skin: Negative.   Allergic/Immunologic: Negative.   Neurological: Negative.   Hematological: Bruises/bleeds easily.  Psychiatric/Behavioral: Negative.     Social History      She  reports that she quit smoking about 5 years ago. Her smoking use included Cigarettes. She has a 3.00 pack-year smoking history. She has never used smokeless tobacco. She reports that she does not use drugs.       Social History   Social History  . Marital status: Married    Spouse name: N/A  . Number of children: N/A  . Years of education: N/A   Social History Main Topics  . Smoking status: Former Smoker    Packs/day: 0.50    Years: 6.00    Types: Cigarettes    Quit date: 01/07/2012  . Smokeless tobacco: Never Used     Comment: vapors occasionally  . Alcohol use None     Comment: red wine  . Drug use: No  . Sexual activity: Yes    Birth control/ protection: Post-menopausal   Other Topics Concern  . None   Social History Narrative  . None    Past Medical History:  Diagnosis Date  . Constipation due to opioid therapy   . Degenerative joint disease (DJD) of hip   . GERD (gastroesophageal reflux disease)   . Osteopenia   . Restless leg syndrome    takes Mirapex and xanax  . SUI (stress urinary incontinence, female)   . Vaso-vagal reaction    after back surgery     Patient Active Problem List   Diagnosis Date Noted  .  Anxiety 09/07/2015  . Angina pectoris with documented spasm ([place]) 09/07/2015  . Clinical depression 09/07/2015  . Elevated WBC count 09/07/2015  . H/O total hip arthroplasty 09/07/2015  . HLD (hyperlipidemia) 09/07/2015  . Headache, migraine 09/07/2015  . Arthritis, degenerative 09/07/2015  . Arthropathy of temporomandibular joint 09/07/2015  . Olecranon bursitis 09/07/2015  . Major depressive disorder, single episode 09/07/2015  . Restless leg 07/08/2015  . Primary osteoarthritis of right hip 01/19/2015  . DJD (degenerative joint  disease) 01/18/2015  . Pain in shoulder 10/05/2013    Past Surgical History:  Procedure Laterality Date  . APPENDECTOMY  1963  . BACK SURGERY    . BREAST CYST ASPIRATION Right 1988  . BREAST EXCISIONAL BIOPSY Right 1990   neg  . BREAST LUMPECTOMY      X 3; benign  . BREAST SURGERY    . EYE SURGERY     Lasik  . FOOT SURGERY Bilateral   . HYSTEROTOMY    . LAMINECTOMY  2006  . ROTATOR CUFF REPAIR Bilateral   . TONSILLECTOMY     age 78  . TOTAL HIP ARTHROPLASTY Right 01/18/2015   Procedure: RIGHT TOTAL HIP ARTHROPLASTY ANTERIOR APPROACH;  Surgeon: Renette Butters, MD;  Location: [facility];  Service: Orthopedics;  Laterality: Right;  . TUBAL LIGATION      Family History        Family Status  Relation Status  . Mother Deceased at age 53   lung cancer  . Father Deceased at age 52   murdered  . Brother Alive  . Daughter Alive        Her family history includes Alcohol abuse in her brother and mother; Breast cancer (age of onset: 75) in her daughter; Cancer in her brother, daughter, father, and mother; Diabetes in her father; Migraines in her mother.     Allergies  Allergen Reactions  . Naproxen Hives  . Penicillins Hives  . Zoloft [Sertraline Hcl] Hives  . Latex Itching and Rash     Current Outpatient Prescriptions:  .  ALPRAZolam (XANAX) 0.5 MG tablet, take 3 tablets by mouth at bedtime, Disp: 90 tablet, Rfl: 5 .  fluticasone (FLONASE) 50 MCG/ACT nasal spray, Place 2 sprays into both nostrils daily., Disp: 16 g, Rfl: 2 .  Multiple Vitamins-Minerals (MULTIVITAMIN WITH MINERALS) tablet, Take 1 tablet by mouth daily., Disp: , Rfl:  .  Nitroglycerin (NITROMIST) 400 MCG/SPRAY AERS, one spray every 5 minutes as needed up to 3 times in a day, Disp: 4.1 g, Rfl: 2 .  omeprazole (PRILOSEC) 20 MG capsule, Take 1 capsule (20 mg total) by mouth daily., Disp: 30 capsule, Rfl: 6 .  pramipexole (MIRAPEX) 1 MG tablet, take 1 tablet by mouth at bedtime, Disp: 30 tablet, Rfl: 12 .   valACYclovir (VALTREX) 500 MG tablet, Take 1 tablet (500 mg total) by mouth daily. Reported on 10/24/2015, Disp: 30 tablet, Rfl: 11   Patient Care Team: Birdie Sons, MD as PCP - General (Family Medicine) Edwin Dada (Inactive) as Referring Physician (Cardiology)      Objective:   Vitals: BP 120/68 (BP Location: Right Arm, Patient Position: Sitting, Cuff Size: Large)   Pulse (!) 57   Temp 98.1 F (36.7 C) (Oral)   Resp 16   Ht '5\' 3"'$  (1.6 m)   Wt 169 lb (76.7 kg)   SpO2 95%   BMI 29.94 kg/m    Vitals:   01/30/17 1014  BP: 120/68  Pulse: (!) 57  Resp:  16  Temp: 98.1 F (36.7 C)  TempSrc: Oral  SpO2: 95%  Weight: 169 lb (76.7 kg)  Height: '5\' 3"'$  (1.6 m)     Physical Exam   General Appearance:    Alert, cooperative, no distress, appears stated age  Head:    Normocephalic, without obvious abnormality, atraumatic  Eyes:    PERRL, conjunctiva/corneas clear, EOM's intact, fundi    benign, both eyes  Ears:    Normal TM's and external ear canals, both ears  Nose:   Nares normal, septum midline, mucosa normal, no drainage    or sinus tenderness  Throat:   Lips, mucosa, and tongue normal; teeth and gums normal  Neck:   Supple, symmetrical, trachea midline, no adenopathy;    thyroid:  no enlargement/tenderness/nodules; no carotid   bruit or JVD  Back:     Symmetric, no curvature, ROM normal, no CVA tenderness  Lungs:     Clear to auscultation bilaterally, respirations unlabored  Chest Wall:    No tenderness or deformity   Heart:    Regular rate and rhythm, S1 and S2 normal, no murmur, rub   or gallop  Breast Exam:    normal appearance, no masses or tenderness  Abdomen:     Soft, non-tender, bowel sounds active all four quadrants,    no masses, no organomegaly  Pelvic:    deferred  Extremities:   Extremities normal, atraumatic, no cyanosis or edema  Pulses:   2+ and symmetric all extremities  Skin:   Skin color, texture, turgor normal, no rashes or lesions  Lymph nodes:    Cervical, supraclavicular, and axillary nodes normal  Neurologic:   CNII-XII intact, normal strength, sensation and reflexes    throughout    Depression Screen PHQ 2/9 Scores 01/30/2017 12/20/2015  PHQ - 2 Score 0 0  PHQ- 9 Score 0 -      Assessment & Plan:     Routine Health Maintenance and Physical Exam  Exercise Activities and Dietary recommendations Goals    None      Immunization History  Administered Date(s) Administered  . Influenza Split 06/03/2010  . Tdap 05/18/2009  . Zoster 09/27/2014    Health Maintenance  Topic Date Due  . Hepatitis C Screening  1951-02-02  . HIV Screening  01/31/2028 (Originally 08/24/1967)  . INFLUENZA VACCINE  05/08/2017  . MAMMOGRAM  01/15/2018  . TETANUS/TDAP  05/19/2019  . PAP SMEAR  12/19/2020  . COLONOSCOPY  05/06/2023     Discussed health benefits of physical activity, and encouraged her to engage in regular exercise appropriate for her age and condition.    -------------------------------------------------------------------- 1. Annual physical exam She is to call [facility] to schedule mammogram.  Generally doing well.  - POCT urinalysis dipstick - Lipid panel - Comprehensive metabolic panel  2. Hyperlipidemia, unspecified hyperlipidemia type No statin since HDL is also high.  - Lipid panel - Comprehensive metabolic panel  3. Primary osteoarthritis of right hip Stable  4. Gastroesophageal reflux disease, esophagitis presence not specified Back on omeprazole due to persistent cough.   5. 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 Restless leg Well controlled on alprazolam and Mirapex.  - Ferritin  7. Urge incontinence  - POCT urinalysis dipstick - Ambulatory referral to Urology  8. Osteopenia, unspecified location BMD end of year.  - VITAMIN D 25 Hydroxy (Vit-D Deficiency, Fractures)  The entirety of the information documented in the History of Present Illness, Review of Systems and  Physical Exam  were personally obtained by me. Portions of this information were initially documented by Kimberl Vig M. Sabra Heck, CMA and reviewed by me for thoroughness and accuracy.     Lelon Huh, MD  [facility]

## 2017-01-31 LAB — COMPREHENSIVE METABOLIC PANEL
ALBUMIN: 4.7 g/dL (ref 3.6–4.8)
ALT: 18 IU/L (ref 0–32)
AST: 14 IU/L (ref 0–40)
Albumin/Globulin Ratio: 2 (ref 1.2–2.2)
Alkaline Phosphatase: 75 IU/L (ref 39–117)
BUN / CREAT RATIO: 26 (ref 12–28)
BUN: 16 mg/dL (ref 8–27)
Bilirubin Total: 0.4 mg/dL (ref 0.0–1.2)
CALCIUM: 9.7 mg/dL (ref 8.7–10.3)
CO2: 26 mmol/L (ref 18–29)
CREATININE: 0.61 mg/dL (ref 0.57–1.00)
Chloride: 104 mmol/L (ref 96–106)
GFR calc Af Amer: 111 mL/min/{1.73_m2} (ref >59)
GFR, EST NON AFRICAN AMERICAN: 96 mL/min/{1.73_m2} (ref >59)
GLOBULIN, TOTAL: 2.3 g/dL (ref 1.5–4.5)
GLUCOSE: 93 mg/dL (ref 65–99)
Potassium: 4.8 mmol/L (ref 3.5–5.2)
SODIUM: 143 mmol/L (ref 134–144)
TOTAL PROTEIN: 7 g/dL (ref 6.0–8.5)

## 2017-01-31 LAB — LIPID PANEL
CHOL/HDL RATIO: 2.8 ratio (ref 0.0–4.4)
Cholesterol, Total: 216 mg/dL — ABNORMAL HIGH (ref 100–199)
HDL: 77 mg/dL (ref >39)
LDL CALC: 125 mg/dL — AB (ref 0–99)
TRIGLYCERIDES: 71 mg/dL (ref 0–149)
VLDL Cholesterol Cal: 14 mg/dL (ref 5–40)

## 2017-01-31 LAB — FERRITIN: FERRITIN: 90 ng/mL (ref 15–150)

## 2017-01-31 LAB — VITAMIN D 25 HYDROXY (VIT D DEFICIENCY, FRACTURES): Vit D, 25-Hydroxy: 24.8 ng/mL — ABNORMAL LOW (ref 30.0–100.0)

## 2017-02-05 ENCOUNTER — Other Ambulatory Visit: Payer: Self-pay | Admitting: Family Medicine

## 2017-02-05 DIAGNOSIS — Z1231 Encounter for screening mammogram for malignant neoplasm of breast: Secondary | ICD-10-CM

## 2017-02-11 ENCOUNTER — Ambulatory Visit: Payer: Federal, State, Local not specified - PPO | Admitting: Urology

## 2017-02-11 ENCOUNTER — Encounter: Payer: Self-pay | Admitting: Urology

## 2017-02-11 VITALS — BP 97/59 | HR 62 | Ht 63.0 in | Wt 167.5 lb

## 2017-02-11 DIAGNOSIS — N393 Stress incontinence (female) (male): Secondary | ICD-10-CM

## 2017-02-11 DIAGNOSIS — N3941 Urge incontinence: Secondary | ICD-10-CM | POA: Diagnosis not present

## 2017-02-11 LAB — URINALYSIS, COMPLETE
Bilirubin, UA: NEGATIVE
GLUCOSE, UA: NEGATIVE
KETONES UA: NEGATIVE
LEUKOCYTES UA: NEGATIVE
Nitrite, UA: NEGATIVE
PROTEIN UA: NEGATIVE
RBC UA: NEGATIVE
SPEC GRAV UA: 1.01 (ref 1.005–1.030)
Urobilinogen, Ur: 0.2 mg/dL (ref 0.2–1.0)
pH, UA: 7 (ref 5.0–7.5)

## 2017-02-11 LAB — BLADDER SCAN AMB NON-IMAGING: Scan Result: 21

## 2017-02-11 NOTE — Progress Notes (Signed)
02/11/2017 10:15 AM   Elizabeth [name] [birth year] [id number]  Referring provider: Birdie Sons, [name], [place] [address]  Chief Complaint  Patient presents with  . Urinary Incontinence    HPI: I was consulted to assist the patient's worsening urinary incontinence over the last 2 years. She leaks with coughing and sneezing and not bending and lifting and wears 3 pads per day damp to moderately wet. At one point in time she was off her pulmonary medications and her cough was severe and she was soaking up to 10 pads per day. On her pulmonary medication she is now back to the above baseline  She time voids because of fluid intake but can hold urination for 2 hours. Generally she has no nocturia.  She has not had previous GU surgery or kidney stones and does not get bladder infections. She has had low back surgery. She has no other neurologic issues. She has not had a hysterectomy. She tends toward constipation. Her incontinence has not been medically treated  Modifying factors: There are no other modifying factors  Associated signs and symptoms: There are no other associated signs and symptoms Aggravating and relieving factors: There are no other aggravating or relieving factors Severity: Moderate Duration: Persistent    PMH: Past Medical History:  Diagnosis Date  . Constipation due to opioid therapy   . Degenerative joint disease (DJD) of hip   . GERD (gastroesophageal reflux disease)   . Osteopenia   . Restless leg syndrome    takes Mirapex and xanax  . SUI (stress urinary incontinence, female)   . Vaso-vagal reaction    after back surgery    Surgical History: Past Surgical History:  Procedure Laterality Date  . APPENDECTOMY  1963  . BACK SURGERY    . BREAST CYST ASPIRATION Right 1988  . BREAST EXCISIONAL BIOPSY Right 1990   neg  . BREAST LUMPECTOMY      X 3; benign  . BREAST SURGERY    . EYE SURGERY     Lasik  .  FOOT SURGERY Bilateral   . HYSTEROTOMY    . LAMINECTOMY  2006  . ROTATOR CUFF REPAIR Bilateral   . TONSILLECTOMY     age 28  . TOTAL HIP ARTHROPLASTY Right 01/18/2015   Procedure: RIGHT TOTAL HIP ARTHROPLASTY ANTERIOR APPROACH;  Surgeon: Renette Butters, MD;  Location: [facility];  Service: Orthopedics;  Laterality: Right;  . TUBAL LIGATION      Home Medications:  Allergies as of 02/11/2017      Reactions   Naproxen Hives   Penicillins Hives   Zoloft [sertraline Hcl] Hives   Latex Itching, Rash      Medication List       Accurate as of 02/11/17 10:15 AM. Always use your most recent med list.          ALPRAZolam 0.5 MG tablet Commonly known as:  XANAX take 3 tablets by mouth at bedtime   fluticasone 50 MCG/ACT nasal spray Commonly known as:  FLONASE Place 2 sprays into both nostrils daily.   multivitamin with minerals tablet Take 1 tablet by mouth daily.   Nitroglycerin 400 MCG/SPRAY Aers Commonly known as:  NITROMIST one spray every 5 minutes as needed up to 3 times in a day   omeprazole 20 MG capsule Commonly known as:  PRILOSEC Take 1 capsule (20 mg total) by mouth daily.   pramipexole 1 MG tablet Commonly known as:  MIRAPEX take 1 tablet  by mouth at bedtime   valACYclovir 500 MG tablet Commonly known as:  VALTREX Take 1 tablet (500 mg total) by mouth daily. Reported on 10/24/2015       Allergies:  Allergies  Allergen Reactions  . Naproxen Hives  . Penicillins Hives  . Zoloft [Sertraline Hcl] Hives  . Latex Itching and Rash    Family History: Family History  Problem Relation Age of Onset  . Alcohol abuse Mother   . Cancer Mother     lung  . Migraines Mother   . Cancer Father     prostate  . Diabetes Father   . Alcohol abuse Brother   . Cancer Brother     skin  . Cancer Daughter     breast  . Breast cancer Daughter 67  . Bladder Cancer Neg Hx   . Kidney cancer Neg Hx     Social History:  reports that she quit smoking about 5 years ago. Her  smoking use included Cigarettes. She has a 3.00 pack-year smoking history. She has never used smokeless tobacco. She reports that she does not use drugs. Her alcohol history is not on file.  ROS: UROLOGY Frequent Urination?: No Hard to postpone urination?: Yes Burning/pain with urination?: No Get up at night to urinate?: No Leakage of urine?: No Urine stream starts and stops?: No Trouble starting stream?: No Do you have to strain to urinate?: No Blood in urine?: No Urinary tract infection?: No Sexually transmitted disease?: No Injury to kidneys or bladder?: No Painful intercourse?: No Weak stream?: No Currently pregnant?: No Vaginal bleeding?: No Last menstrual period?: n  Gastrointestinal Nausea?: No Vomiting?: No Indigestion/heartburn?: Yes Diarrhea?: No Constipation?: No  Constitutional Fever: No Night sweats?: No Weight loss?: No Fatigue?: Yes  Skin Skin rash/lesions?: No Itching?: No  Eyes Blurred vision?: No Double vision?: No  Ears/Nose/Throat Sore throat?: No Sinus problems?: Yes  Hematologic/Lymphatic Swollen glands?: No Easy bruising?: No  Cardiovascular Leg swelling?: No Chest pain?: No  Respiratory Cough?: Yes Shortness of breath?: No  Endocrine Excessive thirst?: No  Musculoskeletal Back pain?: No Joint pain?: No  Neurological Headaches?: No Dizziness?: No  Psychologic Depression?: No Anxiety?: No  Physical Exam: BP (!) 97/59 (BP Location: Left Arm, Patient Position: Sitting, Cuff Size: Normal)   Pulse 62   Ht '5\' 3"'$  (1.6 m)   Wt 167 lb 8 oz (76 kg)   BMI 29.67 kg/m   Constitutional:  Alert and oriented, No acute distress. HEENT: [place] AT, moist mucus membranes.  Trachea midline, no masses. Cardiovascular: No clubbing, cyanosis, or edema. Respiratory: Normal respiratory effort, no increased work of breathing. GI: Abdomen is soft, nontender, nondistended, no abdominal masses GU: On pelvic examination the patient had a large  grade 2 cystocele that really did not descend further with a reasonable cough. She had a moderate central defect but it did not reach the urethrovesical angle. Her cervix was quite well supported and she had a grade 1 or less rectocele. She had moderate grade 2 hypermobility of the bladder neck with a positive cough test that was reproducible. She was somewhat tender. Skin: No rashes, bruises or suspicious lesions. Lymph: No cervical or inguinal adenopathy. Neurologic: Grossly intact, no focal deficits, moving all 4 extremities. Psychiatric: Normal mood and affect.  Laboratory Data: Lab Results  Component Value Date   WBC 6.7 12/20/2015   HGB 14.2 01/07/2015   HCT 41.6 12/20/2015   MCV 90 12/20/2015   PLT 282 12/20/2015    Lab Results  Component Value Date   CREATININE 0.61 01/30/2017    No results found for: PSA  No results found for: TESTOSTERONE  No results found for: HGBA1C  Urinalysis    Component Value Date/Time   COLORURINE YELLOW 01/07/2015 [place] 01/07/2015 1221   LABSPEC 1.022 01/07/2015 1221   PHURINE 5.5 01/07/2015 1221   GLUCOSEU NEGATIVE 01/07/2015 1221   HGBUR NEGATIVE 01/07/2015 1221   BILIRUBINUR Neg 01/30/2017 1051   KETONESUR NEGATIVE 01/07/2015 1221   PROTEINUR Neg 01/30/2017 1051   PROTEINUR NEGATIVE 01/07/2015 1221   UROBILINOGEN 0.2 01/30/2017 1051   UROBILINOGEN 1.0 01/07/2015 1221   NITRITE Neg 01/30/2017 1051   NITRITE NEGATIVE 01/07/2015 1221   LEUKOCYTESUR Negative 01/30/2017 1051    Pertinent Imaging: none  Assessment & Plan:  The patient has improved stress incontinence. The role of urodynamics and physical therapy were discussed. Her frequency is mild and tended to be somewhat fluid dependent. The patient has an impressive cystocele but it is not symptomatic. I drew her a picture. Based upon the pelvic examination a sling could be performed though in the future she might be at some risk of a hinge effect. Having said  that a cystocele repair and arguably a hysterectomy would be a lot for her current presentation.  I spent a lot of time examining the patient with the above findings. We will see if the cystocele is larger on urodynamics. She would like to proceed. She does have some issues with vaginal dryness with intercourse and uses a lubricant.    1. Cystocele 2. Stress incontinence 3. Urinary frequency   - Urinalysis, Complete - Bladder Scan (Post Void Residual) in office   No Follow-up on file.  Reece Packer, MD  [facility] [address] [place] [address], [place] [address] [phone]

## 2017-02-22 ENCOUNTER — Ambulatory Visit
Admission: RE | Admit: 2017-02-22 | Discharge: 2017-02-22 | Disposition: A | Discharge status: Home / Self Care | Payer: Federal, State, Local not specified - PPO | Source: Ambulatory Visit | Attending: Family Medicine | Admitting: Family Medicine

## 2017-02-22 DIAGNOSIS — Z1231 Encounter for screening mammogram for malignant neoplasm of breast: Secondary | ICD-10-CM | POA: Diagnosis present

## 2017-03-06 ENCOUNTER — Other Ambulatory Visit: Payer: Self-pay | Admitting: Urology

## 2017-03-14 ENCOUNTER — Ambulatory Visit: Payer: Federal, State, Local not specified - PPO | Attending: Pulmonary Disease

## 2017-03-14 DIAGNOSIS — R05 Cough: Secondary | ICD-10-CM | POA: Insufficient documentation

## 2017-03-14 DIAGNOSIS — R059 Cough, unspecified: Secondary | ICD-10-CM

## 2017-03-18 ENCOUNTER — Encounter: Payer: Self-pay | Admitting: Urology

## 2017-03-18 ENCOUNTER — Ambulatory Visit (INDEPENDENT_AMBULATORY_CARE_PROVIDER_SITE_OTHER): Payer: Federal, State, Local not specified - PPO | Admitting: Urology

## 2017-03-18 VITALS — BP 114/73 | HR 68 | Ht 62.0 in | Wt 170.0 lb

## 2017-03-18 DIAGNOSIS — N3946 Mixed incontinence: Secondary | ICD-10-CM

## 2017-03-18 NOTE — Progress Notes (Signed)
03/18/2017 8:34 AM   Elizabeth [name] 1951/09/21 [id number]  Referring provider: Birdie Sons, [place] [place], [place] [address]  Chief Complaint  Patient presents with  . Results    Urodynamics  . Follow-up    4 week Stress incontinence urinary frequecy cystocele    HPI: I was consulted to assist the patient's worsening urinary incontinence over the last 2 years. She leaks with coughing and sneezing and not bending and lifting and wears 3 pads per day damp to moderately wet. At one point in time she was off her pulmonary medications and her cough was severe and she was soaking up to 10 pads per day. On her pulmonary medication she is now back to the above baseline  She time voids because of fluid intake but can hold urination for 2 hours. Generally she has no nocturia.  On pelvic examination the patient had a large grade 2 cystocele that really did not descend further with a reasonable cough. She had a moderate central defect but it did not reach the urethrovesical angle. Her cervix was quite well supported and she had a grade 1 or less rectocele. She had moderate grade 2 hypermobility of the bladder neck with a positive cough test that was reproducible. She was somewhat tender.  The patient has improved stress incontinence. Her frequency is mild and tended to be somewhat fluid dependent. The patient has an impressive cystocele but it is not symptomatic. I drew her a picture. Based upon the pelvic examination a sling could be performed though in the future she might be at some risk of a hinge effect. Having said that a cystocele repair and arguably a hysterectomy would be a lot for her current presentation.  Today Frequency and incontinence are stable. On urodynamics she empty efficiently with an interrupted flow pattern. Her bladder capacity was 700 mL. The bladder was stable. Her bladder was a little bit hyposensitive. At 100 mL her cough leak  point pressure was 74 cm of water and it was mild to moderate in severity. At 600 mL it was 54 cm of water with moderate to severe leakage. During voluntary voiding she voided 644 mL with a maximal flow 14 mL/s. Maximum voiding pressure was 4 cm to 8 cm of water. She empty efficiently. She had trouble to void without straining. Her contractions were not well sustained. EMG activity was normal. Bladder neck descended 2 cm. She did have a little bit of overflow leakage at high volumes noted. I must say the cystocele was not impressive fluoroscopically.  Picture was drawn. Watchful waiting versus physical therapy versus sling with my usual template was discussed. She understands that she may be at increased risk of retention based upon the urodynamic findings. Our future stress incontinence pill study and likely the laser study were discussed and she is potentially interested and postmenopausal   PMH: Past Medical History:  Diagnosis Date  . Constipation due to opioid therapy   . Degenerative joint disease (DJD) of hip   . GERD (gastroesophageal reflux disease)   . Osteopenia   . Restless leg syndrome    takes Mirapex and xanax  . SUI (stress urinary incontinence, female)   . Vaso-vagal reaction    after back surgery    Surgical History: Past Surgical History:  Procedure Laterality Date  . APPENDECTOMY  1963  . BACK SURGERY    . BREAST CYST ASPIRATION Right 1988  . BREAST EXCISIONAL BIOPSY Right 1990   neg  .  BREAST LUMPECTOMY      X 3; benign  . BREAST SURGERY    . EYE SURGERY     Lasik  . FOOT SURGERY Bilateral   . HYSTEROTOMY    . LAMINECTOMY  2006  . ROTATOR CUFF REPAIR Bilateral   . TONSILLECTOMY     age 32  . TOTAL HIP ARTHROPLASTY Right 01/18/2015   Procedure: RIGHT TOTAL HIP ARTHROPLASTY ANTERIOR APPROACH;  Surgeon: Renette Butters, MD;  Location: [facility];  Service: Orthopedics;  Laterality: Right;  . TUBAL LIGATION      Home Medications:  Allergies as of 03/18/2017       Reactions   Naproxen Hives   Penicillins Hives   Zoloft [sertraline Hcl] Hives   Latex Itching, Rash      Medication List       Accurate as of 03/18/17  8:34 AM. Always use your most recent med list.          ALPRAZolam 0.5 MG tablet Commonly known as:  XANAX take 3 tablets by mouth at bedtime   fluticasone 50 MCG/ACT nasal spray Commonly known as:  FLONASE Place 2 sprays into both nostrils daily.   multivitamin with minerals tablet Take 1 tablet by mouth daily.   Nitroglycerin 400 MCG/SPRAY Aers Commonly known as:  NITROMIST one spray every 5 minutes as needed up to 3 times in a day   omeprazole 20 MG capsule Commonly known as:  PRILOSEC Take 1 capsule (20 mg total) by mouth daily.   pramipexole 1 MG tablet Commonly known as:  MIRAPEX take 1 tablet by mouth at bedtime   valACYclovir 500 MG tablet Commonly known as:  VALTREX Take 1 tablet (500 mg total) by mouth daily. Reported on 10/24/2015       Allergies:  Allergies  Allergen Reactions  . Naproxen Hives  . Penicillins Hives  . Zoloft [Sertraline Hcl] Hives  . Latex Itching and Rash    Family History: Family History  Problem Relation Age of Onset  . Alcohol abuse Mother   . Cancer Mother        lung  . Migraines Mother   . Cancer Father        prostate  . Diabetes Father   . Alcohol abuse Brother   . Cancer Brother        skin  . Cancer Daughter        breast  . Breast cancer Daughter 43  . Bladder Cancer Neg Hx   . Kidney cancer Neg Hx     Social History:  reports that she quit smoking about 5 years ago. Her smoking use included Cigarettes. She has a 3.00 pack-year smoking history. She has never used smokeless tobacco. She reports that she drinks about 3.0 oz of alcohol per week . She reports that she does not use drugs.  ROS: UROLOGY Frequent Urination?: No Hard to postpone urination?: Yes Burning/pain with urination?: No Get up at night to urinate?: No Leakage of urine?: Yes Urine  stream starts and stops?: No Trouble starting stream?: No Do you have to strain to urinate?: Yes Blood in urine?: No Urinary tract infection?: No Sexually transmitted disease?: No Injury to kidneys or bladder?: No Painful intercourse?: No Weak stream?: No Currently pregnant?: No Vaginal bleeding?: No Last menstrual period?: n  Gastrointestinal Nausea?: No Vomiting?: No Indigestion/heartburn?: No Diarrhea?: No Constipation?: No  Constitutional Fever: No Night sweats?: No Weight loss?: No Fatigue?: No  Skin Skin rash/lesions?: No Itching?: No  Eyes Blurred vision?: No Double vision?: No  Ears/Nose/Throat Sore throat?: No Sinus problems?: Yes  Hematologic/Lymphatic Swollen glands?: No Easy bruising?: Yes  Cardiovascular Leg swelling?: No Chest pain?: No  Respiratory Cough?: No Shortness of breath?: No  Endocrine Excessive thirst?: No  Musculoskeletal Back pain?: No Joint pain?: No  Neurological Headaches?: No Dizziness?: No  Psychologic Depression?: No Anxiety?: No  Physical Exam: BP 114/73   Pulse 68   Ht 5\' 2"  (1.575 m)   Wt 170 lb (77.1 kg)   BMI 31.09 kg/m   Constitutional:  Alert and oriented, No acute distress.   Laboratory Data: Lab Results  Component Value Date   WBC 6.7 12/20/2015   HGB 13.6 12/20/2015   HCT 41.6 12/20/2015   MCV 90 12/20/2015   PLT 282 12/20/2015    Lab Results  Component Value Date   CREATININE 0.61 01/30/2017    No results found for: PSA  No results found for: TESTOSTERONE  No results found for: HGBA1C  Urinalysis    Component Value Date/Time   COLORURINE YELLOW 01/07/2015 1221   APPEARANCEUR Clear 02/11/2017 0933   LABSPEC 1.022 01/07/2015 1221   PHURINE 5.5 01/07/2015 1221   GLUCOSEU Negative 02/11/2017 0933   HGBUR NEGATIVE 01/07/2015 1221   BILIRUBINUR Negative 02/11/2017 0933   KETONESUR NEGATIVE 01/07/2015 1221   PROTEINUR Negative 02/11/2017 0933   PROTEINUR NEGATIVE 01/07/2015  1221   UROBILINOGEN 0.2 01/30/2017 1051   UROBILINOGEN 1.0 01/07/2015 1221   NITRITE Negative 02/11/2017 0933   NITRITE NEGATIVE 01/07/2015 1221   LEUKOCYTESUR Negative 02/11/2017 0933    Pertinent Imaging: none  Assessment & Plan:  The patient may proceed with surgery as the most definitive treatment. She went back on her omeprazole which took care of the postnasal drip and or coughing and subsequent stress incontinence is 85% better. She still has stress incontinence. I will have Bethena Roys call her and used the last stress incontinence pill study exclusion and inclusion criteria to briefly screen her over the phone. She will keep me posted on the patient's final choice  There are no diagnoses linked to this encounter.  No Follow-up on file.  Reece Packer, MD  [facility] [address] [place], [place] [address] [phone]

## 2017-03-19 ENCOUNTER — Telehealth: Payer: Self-pay | Admitting: Urology

## 2017-03-19 NOTE — Telephone Encounter (Signed)
Patient was told to call back if she decided on surgery and that is what she would like to do. How does she go about getting this started? She saw Dr. Matilde Sprang on 03-18-17.  Sharyn Lull

## 2017-03-20 ENCOUNTER — Ambulatory Visit (INDEPENDENT_AMBULATORY_CARE_PROVIDER_SITE_OTHER): Payer: Federal, State, Local not specified - PPO | Admitting: Pulmonary Disease

## 2017-03-20 ENCOUNTER — Encounter: Payer: Self-pay | Admitting: Pulmonary Disease

## 2017-03-20 VITALS — BP 124/76 | HR 70 | Ht 62.0 in | Wt 169.0 lb

## 2017-03-20 DIAGNOSIS — K219 Gastro-esophageal reflux disease without esophagitis: Secondary | ICD-10-CM

## 2017-03-20 DIAGNOSIS — J310 Chronic rhinitis: Secondary | ICD-10-CM

## 2017-03-20 DIAGNOSIS — R911 Solitary pulmonary nodule: Secondary | ICD-10-CM | POA: Diagnosis not present

## 2017-03-20 DIAGNOSIS — R05 Cough: Secondary | ICD-10-CM

## 2017-03-20 DIAGNOSIS — R059 Cough, unspecified: Secondary | ICD-10-CM

## 2017-03-20 NOTE — Progress Notes (Signed)
PULMONARY OFFICE FOLLOW UP NOTE  Requesting MD/Service: Elizabeth Stain, MD (cardiology) Date of initial consultation: 01/17/17 Reason for consultation: Dyspnea, cough, abnormal CT chest  PT PROFILE: 66 y.o. female  Former smoker (off and on from ages 56 to 32. Less than 1PPD at most). Referred for evaluation of abnormal CT chest. Also reports cough and dyspnea.   DATA: CT chest 01/02/17: Nonspecific nodular pulmonary opacities measuring up to approximately 1 cm.If there are no old outside exams document long term stability, suggest follow-up CT chest in 3 months to document stability/resolution PFT 03/14/17: Mild obstruction, normal lung volumes, mild reduction in DLCO which corrects for VA  SUBJ:  This is a routine reevaluation for cough. Her cough is much improved, nearly resolved. In her words "95% improved". She has no new complaints. She believes that omeprazole was the most important therapy leading to this improvement. She is also on Flonase nasal inhaler. She has had PFTs done, reviewed above. She has brought the CT scan of her chest performed at [facility] in March of this year for my review.   Vitals:   03/20/17 0913  BP: 124/76  Pulse: 70  SpO2: 98%  Weight: 169 lb (76.7 kg)  Height: 5\' 2"  (1.575 m)  Room air  EXAM:  Gen: WDWN, NAD HEENT: NCAT, sclera white, oropharynx normal Neck: Supple without JVD Lungs: breath sounds full without adventitious sounds Cardiovascular: Regular, no murmurs noted Abdomen: Soft, nontender, normal BS Ext: without clubbing, cyanosis, edema Neuro: CNs grossly intact, motor and sensory intact  DATA:   BMP Latest Ref Rng & Units 01/30/2017 12/20/2015 01/07/2015  Glucose 65 - 99 mg/dL 93 98 71  BUN 8 - 27 mg/dL 16 15 11   Creatinine 0.57 - 1.00 mg/dL 0.61 0.70 0.65  BUN/Creat Ratio 12 - 28 26 21  -  Sodium 134 - 144 mmol/L 143 143 138  Potassium 3.5 - 5.2 mmol/L 4.8 5.5(H) 4.1  Chloride 96 - 106 mmol/L 104 103 108  CO2 18 - 29 mmol/L 26 24 26    Calcium 8.7 - 10.3 mg/dL 9.7 10.0 9.4    CBC Latest Ref Rng & Units 12/20/2015 01/07/2015 09/28/2014  WBC 3.4 - 10.8 x10E3/uL 6.7 8.2 8.6  Hemoglobin 11.1 - 15.9 g/dL 13.6 14.2 -  Hematocrit 34.0 - 46.6 % 41.6 43.8 -  Platelets 150 - 379 x10E3/uL 282 276 -    CXR:  NNF  CT chest 01/02/17: I have now reviewed the scans brought by her. There are minimal opacities in both lower lobes. I do not believe that these warrant any further follow-up  PFTs: As noted above  IMPRESSION:     ICD-10-CM   1. Cough R05   2. Chronic GERD K21.9   3. Chronic rhinitis J31.0   4. Lung nodule R91.1    Cough appears to be due predominantly to GERD. She has minimal obstructive lung disease by PFTs but no significant exertional dyspnea. I doubt that COPD is contributing substantially to her cough. The lung "nodules" do not warrant any further follow-up   PLAN:  1) continue omeprazole as previously prescribed. If she wishes, she can try changing this to Pepcid or Zantac 2) she may try off of Flonase inhaler. Resume if cough or other nasal symptoms recur 3) I do not believe that further evaluation is necessary of the minimal findings on recent CT scan of the chest  4) follow-up as needed  Merton Border, MD PCCM service Mobile [phone] Pager [phone] 03/20/2017

## 2017-03-20 NOTE — Patient Instructions (Signed)
We agreed that the cough is most likely due to to GERD. I recommend that you try off of the Flonase inhaler for 4-6 weeks to see if the cough returns Flonase may be used as needed for nasal obstruction in the future With regard to the GERD, you may try to change to Pepcid or Zantac if you desire I will review the CT scan more thoroughly. Right now, I do not think that the minimal findings require further follow-up  Follow-up as needed

## 2017-03-21 NOTE — Telephone Encounter (Signed)
LMOM

## 2017-03-26 ENCOUNTER — Other Ambulatory Visit: Payer: Self-pay | Admitting: Radiology

## 2017-03-26 DIAGNOSIS — N393 Stress incontinence (female) (male): Secondary | ICD-10-CM

## 2017-03-26 NOTE — Telephone Encounter (Signed)
Notified pt of sling cystourethropexy scheduled with Dr Matilde Sprang on 04/29/17, pre-admit testing appt on 04/16/17 @8 :00 with CIC teaching in clinic to follow, post op appt on 05/06/17 @9 :45 & to call Friday prior to surgery for arrival time to SDS. Pt has no questions at this time & voices understanding of instructions.

## 2017-04-16 ENCOUNTER — Other Ambulatory Visit: Payer: Federal, State, Local not specified - PPO

## 2017-04-16 ENCOUNTER — Ambulatory Visit: Payer: Federal, State, Local not specified - PPO

## 2017-04-29 ENCOUNTER — Encounter: Admission: RE | Payer: Self-pay | Source: Ambulatory Visit

## 2017-04-29 ENCOUNTER — Ambulatory Visit
Admission: RE | Admit: 2017-04-29 | Payer: Federal, State, Local not specified - PPO | Source: Ambulatory Visit | Admitting: Urology

## 2017-04-29 SURGERY — SUPRAPUBIC BLADDER SUSPENSION
Anesthesia: Choice

## 2017-04-30 ENCOUNTER — Other Ambulatory Visit: Payer: Self-pay | Admitting: Pulmonary Disease

## 2017-05-06 ENCOUNTER — Ambulatory Visit: Payer: Federal, State, Local not specified - PPO

## 2017-06-12 ENCOUNTER — Other Ambulatory Visit: Payer: Self-pay | Admitting: Family Medicine

## 2017-06-12 DIAGNOSIS — F419 Anxiety disorder, unspecified: Secondary | ICD-10-CM

## 2017-06-12 NOTE — Telephone Encounter (Signed)
Pharmacy requesting refills. Thanks!  

## 2017-06-12 NOTE — Telephone Encounter (Signed)
Please call in alprazolam.  

## 2017-06-12 NOTE — Telephone Encounter (Signed)
RX called in at [facility]

## 2017-07-25 ENCOUNTER — Other Ambulatory Visit: Payer: Self-pay | Admitting: Pulmonary Disease

## 2017-08-26 ENCOUNTER — Other Ambulatory Visit: Payer: Self-pay | Admitting: Podiatry

## 2017-08-26 ENCOUNTER — Encounter: Payer: Self-pay | Admitting: Podiatry

## 2017-08-26 ENCOUNTER — Ambulatory Visit: Payer: Federal, State, Local not specified - PPO | Admitting: Podiatry

## 2017-08-26 ENCOUNTER — Ambulatory Visit (INDEPENDENT_AMBULATORY_CARE_PROVIDER_SITE_OTHER): Payer: Federal, State, Local not specified - PPO

## 2017-08-26 VITALS — BP 122/66 | HR 62 | Resp 16

## 2017-08-26 DIAGNOSIS — M7661 Achilles tendinitis, right leg: Secondary | ICD-10-CM

## 2017-08-26 DIAGNOSIS — S86011A Strain of right Achilles tendon, initial encounter: Secondary | ICD-10-CM

## 2017-08-26 DIAGNOSIS — M7751 Other enthesopathy of right foot: Secondary | ICD-10-CM

## 2017-08-26 NOTE — Progress Notes (Signed)
Subjective:  Patient ID: Elizabeth [name], female    DOB: [birth year],  MRN: [id number] HPI Chief Complaint  Patient presents with  . Foot Pain    Posterior heel/achilles area right - aching x 5-6 months, AM pain, swelling, no injury to foot, but did slip and pulled hamstring on left, using ice-some relief, using stairs aggravates    66 y.o. female presents with the above complaint.     Past Medical History:  Diagnosis Date  . Constipation due to opioid therapy   . Degenerative joint disease (DJD) of hip   . GERD (gastroesophageal reflux disease)   . Osteopenia   . Restless leg syndrome    takes Mirapex and xanax  . SUI (stress urinary incontinence, female)   . Vaso-vagal reaction    after back surgery   Past Surgical History:  Procedure Laterality Date  . APPENDECTOMY  1963  . BACK SURGERY    . BREAST CYST ASPIRATION Right 1988  . BREAST EXCISIONAL BIOPSY Right 1990   neg  . BREAST LUMPECTOMY      X 3; benign  . BREAST SURGERY    . EYE SURGERY     Lasik  . FOOT SURGERY Bilateral   . HYSTEROTOMY    . LAMINECTOMY  2006  . RIGHT TOTAL HIP ARTHROPLASTY ANTERIOR APPROACH Right 01/18/2015   Performed by Renette Butters, MD at [facility]  . ROTATOR CUFF REPAIR Bilateral   . TONSILLECTOMY     age 7  . TUBAL LIGATION      Current Outpatient Medications:  .  ALPRAZolam (XANAX) 0.5 MG tablet, take 3 tablets by mouth at bedtime, Disp: 90 tablet, Rfl: 5 .  [place] by mouth., Disp: , Rfl:  .  fluticasone (FLONASE) 50 MCG/ACT nasal spray, spray 2 sprays into each nostril daily, Disp: 16 g, Rfl: 4 .  Multiple Vitamins-Minerals (MULTIVITAMIN WITH MINERALS) tablet, Take 1 tablet by mouth daily., Disp: , Rfl:  .  Nitroglycerin (NITROMIST) 400 MCG/SPRAY AERS, one spray every 5 minutes as needed up to 3 times in a day, Disp: 4.1 g, Rfl: 2 .  omeprazole (PRILOSEC) 20 MG capsule, take 1 capsule by mouth once daily, Disp: 30 capsule, Rfl: 6 .   pramipexole (MIRAPEX) 1 MG tablet, take 1 tablet by mouth at bedtime, Disp: 30 tablet, Rfl: 12 .  valACYclovir (VALTREX) 500 MG tablet, Take 1 tablet (500 mg total) by mouth daily. Reported on 10/24/2015, Disp: 30 tablet, Rfl: 11  Allergies  Allergen Reactions  . Naproxen Hives  . Penicillins Hives  . Zoloft [Sertraline Hcl] Hives  . Latex Itching and Rash   Review of Systems  Cardiovascular: Positive for leg swelling.  Musculoskeletal: Positive for gait problem.  Hematological: Bruises/bleeds easily.  All other systems reviewed and are negative.  Objective:  There were no vitals filed for this visit.  General: Well developed, nourished, in no acute distress, alert and oriented x3   Dermatological: Skin is warm, dry and supple bilateral. Nails x 10 are well maintained; remaining integument appears unremarkable at this time. There are no open sores, no preulcerative lesions, no rash or signs of infection present.  Vascular: Dorsalis Pedis artery and Posterior Tibial artery pedal pulses are 2/4 bilateral with immedate capillary fill time. Pedal hair growth present. No varicosities and no lower extremity edema present bilateral.   Neruologic: Grossly intact via light touch bilateral. Vibratory intact via tuning fork bilateral. Protective threshold with Semmes Wienstein monofilament intact to all pedal  sites bilateral. Patellar and Achilles deep tendon reflexes 2+ bilateral. No Babinski or clonus noted bilateral.   Musculoskeletal: No gross boney pedal deformities bilateral. No pain, crepitus, or limitation noted with foot and ankle range of motion bilateral. Muscular strength 5/5 in all groups tested bilateral. She has pain on palpation of the Achilles tendon as there is a nodule in the Achilles tendon that is non-pulsatile but is exquisitely tender. There is no erythema mild edema little warmth to touch.  Gait: Unassisted, Nonantalgic.    Radiographs:  Radiographs of the right foot do  not demonstrate any type of osseus abnormalities until the lateral view. This does demonstrate a very small sliver of a spur in the posterior aspect of the calcaneus with thickening of the Achilles tendon prior to its insertion on the calcaneus. This appears to be an interstitial tear.  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  Plan: Place her in a cam boot will follow up with her in 1 month to 6 weeks. If she is no better we will consider MRI.     Kalem Rockwell T. [place], [place]

## 2017-09-09 ENCOUNTER — Encounter: Payer: Self-pay | Admitting: Family Medicine

## 2017-09-09 ENCOUNTER — Ambulatory Visit: Payer: Medicare Other | Admitting: Family Medicine

## 2017-09-09 VITALS — BP 138/84 | HR 62 | Temp 98.3°F | Wt 176.2 lb

## 2017-09-09 DIAGNOSIS — N3946 Mixed incontinence: Secondary | ICD-10-CM | POA: Diagnosis not present

## 2017-09-09 DIAGNOSIS — Z1159 Encounter for screening for other viral diseases: Secondary | ICD-10-CM

## 2017-09-09 DIAGNOSIS — Z114 Encounter for screening for human immunodeficiency virus [HIV]: Secondary | ICD-10-CM | POA: Diagnosis not present

## 2017-09-09 DIAGNOSIS — R2689 Other abnormalities of gait and mobility: Secondary | ICD-10-CM

## 2017-09-09 NOTE — Progress Notes (Signed)
Patient: Elizabeth [name] Female    DOB: 1951-04-08   66 y.o.   MRN: [id number] Visit Date: 09/09/2017  Today's Provider: Vernie Murders, PA   Chief Complaint  Patient presents with  . Dizziness   Subjective:    Dizziness  This is a new problem. The current episode started yesterday. The problem occurs constantly. The problem has been unchanged. Associated symptoms comments: Unsteady balance . The symptoms are aggravated by bending (movement). She has tried nothing for the symptoms.   Patient Active Problem List   Diagnosis Date Noted  . GERD (gastroesophageal reflux disease) 01/30/2017  . Insomnia 01/30/2017  . Anxiety 09/07/2015  . Angina pectoris with documented spasm ([place]) 09/07/2015  . Elevated WBC count 09/07/2015  . H/O total hip arthroplasty 09/07/2015  . HLD (hyperlipidemia) 09/07/2015  . Headache, migraine 09/07/2015  . Osteoarthritis 09/07/2015  . Arthropathy of temporomandibular joint 09/07/2015  . Olecranon bursitis 09/07/2015  . Major depressive disorder, single episode 09/07/2015  . Restless leg 07/08/2015  . Primary osteoarthritis of right hip 01/19/2015  . DJD (degenerative joint disease) 01/18/2015  . Left shoulder pain 10/05/2013   Past Surgical History:  Procedure Laterality Date  . APPENDECTOMY  1963  . BACK SURGERY    . BREAST CYST ASPIRATION Right 1988  . BREAST EXCISIONAL BIOPSY Right 1990   neg  . BREAST LUMPECTOMY      X 3; benign  . BREAST SURGERY    . EYE SURGERY     Lasik  . FOOT SURGERY Bilateral   . HYSTEROTOMY    . LAMINECTOMY  2006  . ROTATOR CUFF REPAIR Bilateral   . TONSILLECTOMY     age 71  . TOTAL HIP ARTHROPLASTY Right 01/18/2015   Procedure: RIGHT TOTAL HIP ARTHROPLASTY ANTERIOR APPROACH;  Surgeon: Renette Butters, MD;  Location: [facility];  Service: Orthopedics;  Laterality: Right;  . TUBAL LIGATION     Family History  Problem Relation Age of Onset  . Alcohol abuse Mother   . Cancer Mother        lung  .  Migraines Mother   . Cancer Father        prostate  . Diabetes Father   . Alcohol abuse Brother   . Cancer Brother        skin  . Cancer Daughter        breast  . Breast cancer Daughter 31  . Bladder Cancer Neg Hx   . Kidney cancer Neg Hx    Allergies  Allergen Reactions  . Naproxen Hives  . Penicillins Hives  . Zoloft [Sertraline Hcl] Hives  . Latex Itching and Rash    Current Outpatient Medications:  .  ALPRAZolam (XANAX) 0.5 MG tablet, take 3 tablets by mouth at bedtime, Disp: 90 tablet, Rfl: 5 .  [place] by mouth., Disp: , Rfl:  .  fluticasone (FLONASE) 50 MCG/ACT nasal spray, spray 2 sprays into each nostril daily, Disp: 16 g, Rfl: 4 .  Multiple Vitamins-Minerals (MULTIVITAMIN WITH MINERALS) tablet, Take 1 tablet by mouth daily., Disp: , Rfl:  .  Nitroglycerin (NITROMIST) 400 MCG/SPRAY AERS, one spray every 5 minutes as needed up to 3 times in a day, Disp: 4.1 g, Rfl: 2 .  omeprazole (PRILOSEC) 20 MG capsule, take 1 capsule by mouth once daily, Disp: 30 capsule, Rfl: 6 .  pramipexole (MIRAPEX) 1 MG tablet, take 1 tablet by mouth at bedtime, Disp: 30 tablet, Rfl: 12 .  valACYclovir (  VALTREX) 500 MG tablet, Take 1 tablet (500 mg total) by mouth daily. Reported on 10/24/2015, Disp: 30 tablet, Rfl: 11  Review of Systems  Constitutional: Negative.   Respiratory: Negative.   Cardiovascular: Negative.   Neurological: Positive for dizziness.    Social History   Tobacco Use  . Smoking status: Former Smoker    Packs/day: 0.50    Years: 6.00    Pack years: 3.00    Types: Cigarettes    Last attempt to quit: 01/07/2012    Years since quitting: 5.6  . Smokeless tobacco: Never Used  . Tobacco comment: vapors occasionally  Substance Use Topics  . Alcohol use: Yes    Alcohol/week: 3.0 oz    Types: 5 Glasses of wine per week    Comment: red wine   Objective:   BP 138/84 (BP Location: Right Arm, Patient Position: Sitting, Cuff Size: Normal)   Pulse 62    Temp 98.3 F (36.8 C) (Oral)   Wt 176 lb 3.2 oz (79.9 kg)   SpO2 98%   BMI 32.23 kg/m  BP Readings from Last 3 Encounters:  09/09/17 138/84  08/26/17 122/66  03/20/17 124/76   Physical Exam  Constitutional: She is oriented to person, place, and time. She appears well-developed and well-nourished. No distress.  HENT:  Head: Normocephalic and atraumatic.  Right Ear: Hearing normal.  Left Ear: Hearing normal.  Nose: Nose normal.  Mouth/Throat: Oropharynx is clear and moist.  Large amount of cerumen collection. TM's with good reflex.  Eyes: Conjunctivae and lids are normal. Right eye exhibits no discharge. Left eye exhibits no discharge. No scleral icterus.  Neck: Neck supple.  Cardiovascular: Normal rate and regular rhythm.  Pulmonary/Chest: Effort normal and breath sounds normal. No respiratory distress.  Abdominal: Soft. Bowel sounds are normal.  Musculoskeletal: Normal range of motion.  Lymphadenopathy:    She has no cervical adenopathy.  Neurological: She is alert and oriented to person, place, and time.  Imbalance with standing or walking.  Skin: Skin is intact. No lesion and no rash noted.  Psychiatric: She has a normal mood and affect. Her speech is normal and behavior is normal. Thought content normal.      Assessment & Plan:     1. Balance problem States she has had balance problems for a "long time" but feels it was worse yesterday. No recent URI symptoms, hearing loss or spinning sensation. Episode yesterday was worse after bending over and lasted several seconds. No nausea or vomiting. Eating and drinking well. Will check routine labs and schedule neurology referral to rule out MS with history of chronic balance problems and bladder control issues. - CBC with Differential/Platelet - COMPLETE METABOLIC PANEL WITH GFR - TSH - Ambulatory referral to Neurology  2. Mixed stress and urge urinary incontinence Wears pad all the time due to leakage if she coughs or  strains. Some urge incontinence also. Will check labs for signs of kidney disease. With balance issues, will get neurology referral to rule out MS. - CBC with Differential/Platelet - COMPLETE METABOLIC PANEL WITH GFR  3. Screening for HIV (human immunodeficiency virus) - HIV antibody  4. Need for hepatitis C screening test - Hepatitis C Antibody       Vernie Murders, PA  [facility]

## 2017-09-11 LAB — COMPLETE METABOLIC PANEL WITH GFR
AG Ratio: 1.7 (calc) (ref 1.0–2.5)
ALBUMIN MSPROF: 4.1 g/dL (ref 3.6–5.1)
ALT: 14 U/L (ref 6–29)
AST: 13 U/L (ref 10–35)
Alkaline phosphatase (APISO): 67 U/L (ref 33–130)
BUN: 11 mg/dL (ref 7–25)
CALCIUM: 9.4 mg/dL (ref 8.6–10.4)
CO2: 28 mmol/L (ref 20–32)
CREATININE: 0.7 mg/dL (ref 0.50–0.99)
Chloride: 108 mmol/L (ref 98–110)
GFR, EST NON AFRICAN AMERICAN: 91 mL/min/{1.73_m2} (ref >=60)
GFR, Est African American: 105 mL/min/{1.73_m2} (ref >=60)
GLOBULIN: 2.4 g/dL (ref 1.9–3.7)
Glucose, Bld: 103 mg/dL — ABNORMAL HIGH (ref 65–99)
Potassium: 4.1 mmol/L (ref 3.5–5.3)
SODIUM: 141 mmol/L (ref 135–146)
TOTAL PROTEIN: 6.5 g/dL (ref 6.1–8.1)
Total Bilirubin: 0.5 mg/dL (ref 0.2–1.2)

## 2017-09-11 LAB — CBC WITH DIFFERENTIAL/PLATELET
Basophils Absolute: 19 cells/uL (ref 0–200)
Basophils Relative: 0.3 %
EOS PCT: 1.3 %
Eosinophils Absolute: 82 cells/uL (ref 15–500)
HEMATOCRIT: 39.1 % (ref 35.0–45.0)
HEMOGLOBIN: 13.1 g/dL (ref 11.7–15.5)
LYMPHS ABS: 2438 {cells}/uL (ref 850–3900)
MCH: 30.5 pg (ref 27.0–33.0)
MCHC: 33.5 g/dL (ref 32.0–36.0)
MCV: 90.9 fL (ref 80.0–100.0)
MPV: 11.4 fL (ref 7.5–12.5)
Monocytes Relative: 5.3 %
NEUTROS ABS: 3427 {cells}/uL (ref 1500–7800)
Neutrophils Relative %: 54.4 %
Platelets: 243 10*3/uL (ref 140–400)
RBC: 4.3 10*6/uL (ref 3.80–5.10)
RDW: 12.4 % (ref 11.0–15.0)
Total Lymphocyte: 38.7 %
WBC mixed population: 334 cells/uL (ref 200–950)
WBC: 6.3 10*3/uL (ref 3.8–10.8)

## 2017-09-11 LAB — HIV ANTIBODY (ROUTINE TESTING W REFLEX): HIV: NONREACTIVE

## 2017-09-11 LAB — HEPATITIS C ANTIBODY
Hepatitis C Ab: NONREACTIVE
SIGNAL TO CUT-OFF: 0.01 (ref <1.00)

## 2017-09-11 LAB — TSH: TSH: 1.83 mIU/L (ref 0.40–4.50)

## 2017-09-12 ENCOUNTER — Telehealth: Payer: Self-pay | Admitting: Family Medicine

## 2017-09-12 NOTE — Telephone Encounter (Signed)
Pt is requesting Elizabeth [name] return her call to discuss her lab results and discuss plan of care. Pt stated she is concerned that she has to go a month to see if she might have MS and if it turns out she doesn't have MS she has wasted a month and not been treated for what she does have. Please advise. Thanks TNP

## 2017-09-12 NOTE — Telephone Encounter (Signed)
All labs normal without evidence of metabolic disorder. Proceed with neurology referral. If balance worsening, can schedule CT scan of head to rule out intracranial pathology. MS, if present, can be intermittent for years.

## 2017-09-12 NOTE — Telephone Encounter (Signed)
Tried to call the patient, but no answer. Left a message the nurse may call or she could return a call here.

## 2017-09-20 NOTE — Telephone Encounter (Signed)
Patient advised. She states symptoms have improved, but she is keeping appointment with Neuro.

## 2017-09-24 ENCOUNTER — Encounter: Payer: Self-pay | Admitting: Family Medicine

## 2017-09-24 ENCOUNTER — Ambulatory Visit: Payer: Medicare Other | Admitting: Family Medicine

## 2017-09-24 VITALS — BP 108/72 | Temp 97.9°F | Resp 16 | Wt 171.0 lb

## 2017-09-24 DIAGNOSIS — Z23 Encounter for immunization: Secondary | ICD-10-CM | POA: Diagnosis not present

## 2017-09-24 DIAGNOSIS — R270 Ataxia, unspecified: Secondary | ICD-10-CM | POA: Diagnosis not present

## 2017-09-24 NOTE — Progress Notes (Signed)
Patient: Elizabeth [name] Female    DOB: 1950/11/28   66 y.o.   MRN: [id number] Visit Date: 09/24/2017  Today's Provider: Lelon Huh, MD   Chief Complaint  Patient presents with  . wants to discuss labs   Subjective:    HPI Patient wants to discuss her lab results that was done on 09/10/2017. She was seen by Vernie Murders on 09/09/2016 for dizziness. She states she woke up the previous day and felt like she drunk as soon as she got out of bed. Did have any spinning sensation and no orthostatic symptoms. She was just unable to keep her balance as if she had been drinking too much alcohol. No other associated symptoms. It gradually improved and was completely back to normal within a week and symptoms have not returned. Referral to neurology was initiated.    She is also wanting to get flu and pneumonia shots today.     Allergies  Allergen Reactions  . Zostavax [Zoster Vaccine Live] Rash  . Naproxen Hives  . Penicillins Hives  . Zoloft [Sertraline Hcl] Hives  . Latex Itching and Rash     Current Outpatient Medications:  .  ALPRAZolam (XANAX) 0.5 MG tablet, take 3 tablets by mouth at bedtime, Disp: 90 tablet, Rfl: 5 .  [place] by mouth., Disp: , Rfl:  .  fluticasone (FLONASE) 50 MCG/ACT nasal spray, spray 2 sprays into each nostril daily, Disp: 16 g, Rfl: 4 .  Multiple Vitamins-Minerals (MULTIVITAMIN WITH MINERALS) tablet, Take 1 tablet by mouth daily., Disp: , Rfl:  .  Nitroglycerin (NITROMIST) 400 MCG/SPRAY AERS, one spray every 5 minutes as needed up to 3 times in a day, Disp: 4.1 g, Rfl: 2 .  omeprazole (PRILOSEC) 20 MG capsule, take 1 capsule by mouth once daily, Disp: 30 capsule, Rfl: 6 .  pramipexole (MIRAPEX) 1 MG tablet, take 1 tablet by mouth at bedtime, Disp: 30 tablet, Rfl: 12 .  valACYclovir (VALTREX) 500 MG tablet, Take 1 tablet (500 mg total) by mouth daily. Reported on 10/24/2015, Disp: 30 tablet, Rfl: 11  Review of  Systems  Constitutional: Negative.   Respiratory: Negative.   Cardiovascular: Negative.   Musculoskeletal: Negative.   Neurological: Negative.   Psychiatric/Behavioral: Negative.     Social History   Tobacco Use  . Smoking status: Former Smoker    Packs/day: 0.50    Years: 6.00    Pack years: 3.00    Types: Cigarettes    Last attempt to quit: 01/07/2012    Years since quitting: 5.7  . Smokeless tobacco: Never Used  . Tobacco comment: vapors occasionally  Substance Use Topics  . Alcohol use: Yes    Alcohol/week: 3.0 oz    Types: 5 Glasses of wine per week    Comment: red wine   Objective:   BP 108/72   Temp 97.9 F (36.6 C)   Resp 16   Wt 171 lb (77.6 kg)   BMI 31.28 kg/m  Vitals:   09/24/17 1052  BP: 108/72  Resp: 16  Temp: 97.9 F (36.6 C)  Weight: 171 lb (77.6 kg)     Physical Exam  General appearance: alert, well developed, well nourished, cooperative and in no distress Head: Normocephalic, without obvious abnormality, atraumatic Respiratory: Respirations even and unlabored, normal respiratory rate Extremities: No gross deformities Skin: Skin color, texture, turgor normal. No rashes seen  Psych: Appropriate mood and affect. Neurologic: Mental status: Alert, oriented to person, place, and  time, thought content appropriate.     Assessment & Plan:     1. Ataxia Transient episodes which is consistent with cerebellar dysfunction. Has no completely resolved. Agree with neurology referral that is scheduled for January.   2. Influenza vaccine needed  - Flu vaccine HIGH DOSE PF (Fluzone High dose)  3. Need for pneumococcal vaccine  - Pneumococcal conjugate vaccine 13-valent IM       Lelon Huh, MD  [facility]

## 2017-10-09 ENCOUNTER — Ambulatory Visit: Payer: Federal, State, Local not specified - PPO | Admitting: Podiatry

## 2017-10-09 DIAGNOSIS — M7661 Achilles tendinitis, right leg: Secondary | ICD-10-CM

## 2017-10-09 NOTE — Progress Notes (Signed)
She presents today for follow-up of her right Achilles area.  She states that the boot has done nothing but aggravated and made it more sore.  States that she is ready to have an MRI on and get this thing fixed.  She also states that she is going to see a neurologist because she has had a lot of dizziness.  Objective: Vital signs are stable she is alert and oriented x3.  She still has pain on palpation of a large nonpulsatile mass centrally located mid Achilles of the right leg.  It is warm to the touch nonpulsatile in nature.  It is firm and painful.  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  Plan: Requesting an MRI since all conservative therapies have failed to alleviate this patient's pain.  I will follow-up with her once the MRI has been completed.  This will be a rear foot ankle MRI attention directed toward the mid substance of the Achilles.  This is a surgical consideration.

## 2017-10-10 ENCOUNTER — Telehealth: Payer: Self-pay

## 2017-10-10 NOTE — Telephone Encounter (Signed)
Per [place] Levo With [employer], no precert is required for MRI.   Patient has been notified and she will call to schedule her MRI appt to her convenience

## 2017-10-10 NOTE — Addendum Note (Signed)
Addended by: Graceann Congress D on: 10/10/2017 01:41 PM   Modules accepted: Orders

## 2017-10-11 ENCOUNTER — Encounter: Payer: Self-pay | Admitting: Family Medicine

## 2017-10-14 ENCOUNTER — Other Ambulatory Visit: Payer: Self-pay | Admitting: Neurology

## 2017-10-14 DIAGNOSIS — R42 Dizziness and giddiness: Secondary | ICD-10-CM

## 2017-10-18 ENCOUNTER — Ambulatory Visit
Admission: RE | Admit: 2017-10-18 | Discharge: 2017-10-18 | Disposition: A | Discharge status: Home / Self Care | Payer: Federal, State, Local not specified - PPO | Source: Ambulatory Visit | Attending: Podiatry | Admitting: Podiatry

## 2017-10-18 DIAGNOSIS — M7661 Achilles tendinitis, right leg: Secondary | ICD-10-CM | POA: Insufficient documentation

## 2017-10-21 ENCOUNTER — Telehealth: Payer: Self-pay | Admitting: *Deleted

## 2017-10-21 NOTE — Telephone Encounter (Signed)
-----   Message from Garrel Ridgel, [place] sent at 10/19/2017  7:48 AM EST ----- Please send for an over read and inform patient of the delay.

## 2017-10-21 NOTE — Telephone Encounter (Signed)
I informed pt of Dr. Stephenie Acres review of results and request to send copy of MRI disc to a radiology specialist for indepth information for treatment planning. Pt states she read the results and there was no tear, so she doesn't need surgery. I told pt that was not necessarily the case, Dr. Milinda Pointer may also send for a 2nd opinion, if the initial results were not as he expected. Pt states understanding. Faxed request for MRI disc copy to [facility].

## 2017-10-22 ENCOUNTER — Ambulatory Visit
Admission: RE | Admit: 2017-10-22 | Discharge: 2017-10-22 | Disposition: A | Discharge status: Home / Self Care | Payer: Federal, State, Local not specified - PPO | Source: Ambulatory Visit | Attending: Neurology | Admitting: Neurology

## 2017-10-22 DIAGNOSIS — R9089 Other abnormal findings on diagnostic imaging of central nervous system: Secondary | ICD-10-CM | POA: Insufficient documentation

## 2017-10-22 DIAGNOSIS — M62838 Other muscle spasm: Secondary | ICD-10-CM | POA: Insufficient documentation

## 2017-10-22 DIAGNOSIS — R2689 Other abnormalities of gait and mobility: Secondary | ICD-10-CM | POA: Insufficient documentation

## 2017-10-22 DIAGNOSIS — R42 Dizziness and giddiness: Secondary | ICD-10-CM | POA: Diagnosis present

## 2017-10-22 MED ORDER — GADOBENATE DIMEGLUMINE 529 MG/ML IV SOLN
15.0000 mL | Freq: Once | INTRAVENOUS | Status: AC | PRN
  Administered 2017-10-22: 15 mL via INTRAVENOUS

## 2017-11-12 ENCOUNTER — Encounter: Payer: Self-pay | Admitting: Podiatry

## 2017-11-12 ENCOUNTER — Telehealth: Payer: Self-pay | Admitting: Podiatry

## 2017-11-12 NOTE — Progress Notes (Signed)
Tried to call to leave vm but the phone kept ringing 2/5/19CS @1051am 

## 2017-11-12 NOTE — Telephone Encounter (Signed)
Left voicemail for pt to call office to schedule surgical consultation with Dr. Milinda Pointer.

## 2017-11-20 ENCOUNTER — Other Ambulatory Visit: Payer: Self-pay | Admitting: Pulmonary Disease

## 2017-12-09 ENCOUNTER — Other Ambulatory Visit: Payer: Self-pay | Admitting: Family Medicine

## 2017-12-09 DIAGNOSIS — G2581 Restless legs syndrome: Secondary | ICD-10-CM

## 2017-12-09 MED ORDER — PRAMIPEXOLE DIHYDROCHLORIDE 1 MG PO TABS: 1.0000 mg | ORAL_TABLET | Freq: Every day | ORAL | 12 refills | Status: DC

## 2017-12-09 NOTE — Telephone Encounter (Signed)
Pt requesting refill Pramipexole 1 MG sent to [facility] on [address].

## 2018-01-01 ENCOUNTER — Other Ambulatory Visit: Payer: Self-pay | Admitting: Family Medicine

## 2018-01-01 DIAGNOSIS — F419 Anxiety disorder, unspecified: Secondary | ICD-10-CM

## 2018-01-01 NOTE — Telephone Encounter (Signed)
Pt called saying she has been trying to get a prescription for the generic xanax since first of the month.  March   She uses [facility] near [place] that use to [employer] [address].  Pt's call back Is [phone].  Thanks Con Memos

## 2018-01-21 ENCOUNTER — Other Ambulatory Visit: Payer: Self-pay | Admitting: Family Medicine

## 2018-01-21 DIAGNOSIS — Z1231 Encounter for screening mammogram for malignant neoplasm of breast: Secondary | ICD-10-CM

## 2018-02-05 ENCOUNTER — Other Ambulatory Visit: Payer: Self-pay | Admitting: Pulmonary Disease

## 2018-02-24 ENCOUNTER — Ambulatory Visit
Admission: RE | Admit: 2018-02-24 | Discharge: 2018-02-24 | Disposition: A | Discharge status: Home / Self Care | Payer: Federal, State, Local not specified - PPO | Source: Ambulatory Visit | Attending: Family Medicine | Admitting: Family Medicine

## 2018-02-24 DIAGNOSIS — Z1231 Encounter for screening mammogram for malignant neoplasm of breast: Secondary | ICD-10-CM | POA: Diagnosis present

## 2018-03-04 ENCOUNTER — Other Ambulatory Visit: Payer: Self-pay | Admitting: Family Medicine

## 2018-03-04 DIAGNOSIS — F419 Anxiety disorder, unspecified: Secondary | ICD-10-CM

## 2018-03-15 ENCOUNTER — Other Ambulatory Visit: Payer: Self-pay | Admitting: Pulmonary Disease

## 2018-03-26 NOTE — Discharge Instructions (Signed)

## 2018-03-27 ENCOUNTER — Ambulatory Visit: Payer: Federal, State, Local not specified - PPO | Admitting: Family Medicine

## 2018-03-27 ENCOUNTER — Encounter: Payer: Self-pay | Admitting: Family Medicine

## 2018-03-27 ENCOUNTER — Other Ambulatory Visit: Payer: Self-pay

## 2018-03-27 ENCOUNTER — Encounter: Payer: Self-pay | Admitting: *Deleted

## 2018-03-27 VITALS — BP 120/76 | HR 71 | Temp 98.6°F | Wt 166.0 lb

## 2018-03-27 DIAGNOSIS — K648 Other hemorrhoids: Secondary | ICD-10-CM | POA: Diagnosis not present

## 2018-03-27 MED ORDER — HYDROCORTISONE ACETATE 25 MG RE SUPP: 25.0000 mg | Freq: Two times a day (BID) | RECTAL | 0 refills | Status: DC

## 2018-03-27 NOTE — Patient Instructions (Signed)
Hemorrhoids Hemorrhoids are swollen veins in and around the rectum or anus. There are two types of hemorrhoids:  Internal hemorrhoids. These occur in the veins that are just inside the rectum. They may poke through to the outside and become irritated and painful.  External hemorrhoids. These occur in the veins that are outside of the anus and can be felt as a painful swelling or hard lump near the anus.  Most hemorrhoids do not cause serious problems, and they can be managed with home treatments such as diet and lifestyle changes. If home treatments do not help your symptoms, procedures can be done to shrink or remove the hemorrhoids. What are the causes? This condition is caused by increased pressure in the anal area. This pressure may result from various things, including:  Constipation.  Straining to have a bowel movement.  Diarrhea.  Pregnancy.  Obesity.  Sitting for long periods of time.  Heavy lifting or other activity that causes you to strain.  Anal sex.  What are the signs or symptoms? Symptoms of this condition include:  Pain.  Anal itching or irritation.  Rectal bleeding.  Leakage of stool (feces).  Anal swelling.  One or more lumps around the anus.  How is this diagnosed? This condition can often be diagnosed through a visual exam. Other exams or tests may also be done, such as:  Examination of the rectal area with a gloved hand (digital rectal exam).  Examination of the anal canal using a small tube (anoscope).  A blood test, if you have lost a significant amount of blood.  A test to look inside the colon (sigmoidoscopy or colonoscopy).  How is this treated? This condition can usually be treated at home. However, various procedures may be done if dietary changes, lifestyle changes, and other home treatments do not help your symptoms. These procedures can help make the hemorrhoids smaller or remove them completely. Some of these procedures involve  surgery, and others do not. Common procedures include:  Rubber band ligation. Rubber bands are placed at the base of the hemorrhoids to cut off the blood supply to them.  Sclerotherapy. Medicine is injected into the hemorrhoids to shrink them.  Infrared coagulation. A type of light energy is used to get rid of the hemorrhoids.  Hemorrhoidectomy surgery. The hemorrhoids are surgically removed, and the veins that supply them are tied off.  Stapled hemorrhoidopexy surgery. A circular stapling device is used to remove the hemorrhoids and use staples to cut off the blood supply to them.  Follow these instructions at home: Eating and drinking  Eat foods that have a lot of fiber in them, such as whole grains, beans, nuts, fruits, and vegetables. Ask your health care provider about taking products that have added fiber (fiber supplements).  Drink enough fluid to keep your urine clear or pale yellow. Managing pain and swelling  Take warm sitz baths for 20 minutes, 3-4 times a day to ease pain and discomfort.  If directed, apply ice to the affected area. Using ice packs between sitz baths may be helpful. ? Put ice in a plastic bag. ? Place a towel between your skin and the bag. ? Leave the ice on for 20 minutes, 2-3 times a day. General instructions  Take over-the-counter and prescription medicines only as told by your health care provider.  Use medicated creams or suppositories as told.  Exercise regularly.  Go to the bathroom when you have the urge to have a bowel movement. Do not wait.    Avoid straining to have bowel movements.  Keep the anal area dry and clean. Use wet toilet paper or moist towelettes after a bowel movement.  Do not sit on the toilet for long periods of time. This increases blood pooling and pain. Contact a health care provider if:  You have increasing pain and swelling that are not controlled by treatment or medicine.  You have uncontrolled bleeding.  You  have difficulty having a bowel movement, or you are unable to have a bowel movement.  You have pain or inflammation outside the area of the hemorrhoids. This information is not intended to replace advice given to you by your health care provider. Make sure you discuss any questions you have with your health care provider. Document Released: 09/21/2000 Document Revised: 02/22/2016 Document Reviewed: 06/08/2015 Elsevier Interactive Patient Education  2018 Elsevier Inc.  

## 2018-03-27 NOTE — Progress Notes (Signed)
Patient: Elizabeth [name] Female    DOB: 02-02-1951   67 y.o.   MRN: [id number] Visit Date: 03/27/2018  Today's Provider: Vernie Murders, PA   Chief Complaint  Patient presents with  . Blood In Stools   Subjective:    HPI Patient presents today C/O possible blood in stool. She states she notices blood on tissue after wiping after a bowel movement. She reports noticing blood on Tuesday. She states she did not notice any blood yesterday, but had blood this morning. Patient denies constipation or diarrhea. She reports a PMH of IBS and Diverticulosis.     Past Medical History:  Diagnosis Date  . Constipation due to opioid therapy   . Degenerative joint disease (DJD) of hip   . GERD (gastroesophageal reflux disease)   . Osteopenia   . Restless leg syndrome    takes Mirapex and xanax  . SUI (stress urinary incontinence, female)   . Vaso-vagal reaction    after back surgery   Patient Active Problem List   Diagnosis Date Noted  . GERD (gastroesophageal reflux disease) 01/30/2017  . Insomnia 01/30/2017  . Anxiety 09/07/2015  . Angina pectoris with documented spasm ([place]) 09/07/2015  . Elevated WBC count 09/07/2015  . H/O total hip arthroplasty 09/07/2015  . HLD (hyperlipidemia) 09/07/2015  . Headache, migraine 09/07/2015  . Osteoarthritis 09/07/2015  . Arthropathy of temporomandibular joint 09/07/2015  . Olecranon bursitis 09/07/2015  . Major depressive disorder, single episode 09/07/2015  . Restless leg 07/08/2015  . Primary osteoarthritis of right hip 01/19/2015  . DJD (degenerative joint disease) 01/18/2015  . Left shoulder pain 10/05/2013   Past Surgical History:  Procedure Laterality Date  . APPENDECTOMY  1963  . BACK SURGERY    . BREAST CYST ASPIRATION Right 1988  . BREAST EXCISIONAL BIOPSY Right 1990   benign x 3  . BREAST SURGERY    . EYE SURGERY     Lasik  . FOOT SURGERY Bilateral   . HYSTEROTOMY    . LAMINECTOMY  2006  . ROTATOR CUFF REPAIR  Bilateral   . TONSILLECTOMY     age 58  . TOTAL HIP ARTHROPLASTY Right 01/18/2015   Procedure: RIGHT TOTAL HIP ARTHROPLASTY ANTERIOR APPROACH;  Surgeon: Renette Butters, MD;  Location: [facility];  Service: Orthopedics;  Laterality: Right;  . TUBAL LIGATION     Family History  Problem Relation Age of Onset  . Alcohol abuse Mother   . Cancer Mother        lung  . Migraines Mother   . Cancer Father        prostate  . Diabetes Father   . Alcohol abuse Brother   . Cancer Brother        skin  . Cancer Daughter        breast  . Breast cancer Daughter 4  . Bladder Cancer Neg Hx   . Kidney cancer Neg Hx    Allergies  Allergen Reactions  . Zostavax [Zoster Vaccine Live] Rash  . Naproxen Hives  . Penicillins Hives  . Zoloft [Sertraline Hcl] Hives  . Latex Itching and Rash    Current Outpatient Medications:  .  ALPRAZolam (XANAX) 0.5 MG tablet, TAKE 3 TABLETS BY MOUTH EVERY NIGHT AT BEDTIME, Disp: 90 tablet, Rfl: 1 .  [place] by mouth., Disp: , Rfl:  .  fluticasone (FLONASE) 50 MCG/ACT nasal spray, SHAKE LIQUID AND USE 2 SPRAYS IN EACH NOSTRIL EVERY DAY, Disp: 48 g,  Rfl: 1 .  MILK THISTLE PO, Take 150 mg by mouth daily., Disp: , Rfl:  .  Multiple Vitamins-Minerals (MULTIVITAMIN WITH MINERALS) tablet, Take 1 tablet by mouth daily., Disp: , Rfl:  .  Nitroglycerin (NITROMIST) 400 MCG/SPRAY AERS, one spray every 5 minutes as needed up to 3 times in a day, Disp: 4.1 g, Rfl: 2 .  omeprazole (PRILOSEC) 20 MG capsule, TAKE 1 CAPSULE BY MOUTH ONCE DAILY, Disp: 90 capsule, Rfl: 2 .  pramipexole (MIRAPEX) 1 MG tablet, Take 1 tablet (1 mg total) by mouth at bedtime., Disp: 30 tablet, Rfl: 12 .  valACYclovir (VALTREX) 500 MG tablet, Take 1 tablet (500 mg total) by mouth daily. Reported on 10/24/2015, Disp: 30 tablet, Rfl: 11  Review of Systems  Constitutional: Negative.   Respiratory: Negative.   Cardiovascular: Negative.   Gastrointestinal: Positive for blood in stool.    Social History   Tobacco Use  . Smoking status: Former Smoker    Packs/day: 0.50    Years: 6.00    Pack years: 3.00    Types: Cigarettes    Last attempt to quit: 01/07/2012    Years since quitting: 6.2  . Smokeless tobacco: Never Used  . Tobacco comment: vapors occasionally  Substance Use Topics  . Alcohol use: Yes    Alcohol/week: 3.0 oz    Types: 5 Glasses of wine per week    Comment: red wine   Objective:   BP 120/76 (BP Location: Right Arm, Patient Position: Sitting, Cuff Size: Normal)   Pulse 71   Temp 98.6 F (37 C) (Oral)   Wt 166 lb (75.3 kg)   SpO2 97%   BMI 29.41 kg/m  Wt Readings from Last 3 Encounters:  03/27/18 166 lb (75.3 kg)  09/24/17 171 lb (77.6 kg)  09/09/17 176 lb 3.2 oz (79.9 kg)    Physical Exam  Constitutional: She is oriented to person, place, and time. She appears well-developed and well-nourished. No distress.  HENT:  Head: Normocephalic and atraumatic.  Right Ear: Hearing normal.  Left Ear: Hearing normal.  Nose: Nose normal.  Eyes: Conjunctivae and lids are normal. Right eye exhibits no discharge. Left eye exhibits no discharge. No scleral icterus.  Cardiovascular: Normal rate.  Pulmonary/Chest: Effort normal and breath sounds normal. No respiratory distress.  Abdominal: Bowel sounds are normal. She exhibits no distension and no mass. There is no tenderness. There is no guarding.  Musculoskeletal: Normal range of motion.  Neurological: She is alert and oriented to person, place, and time.  Skin: Skin is intact. No lesion and no rash noted.  Psychiatric: She has a normal mood and affect. Her speech is normal and behavior is normal. Thought content normal.      Assessment & Plan:     1. Inflamed internal hemorrhoid Onset with some red blood on tissue the past day or two. History of IBS and some external hemorrhoids in the past. Last colonoscopy by Dr. Allen Norris in 2014 only showed diverticulosis. No abdominal pain or anal pain today.  Anoscopic exam showed a raw area at 6 o'clock internally with enlarged purple veins. Treat with Anusol-HC and warm soaks. Recheck prn. - hydrocortisone (ANUSOL-HC) 25 MG suppository; Place 1 suppository (25 mg total) rectally 2 (two) times daily.  Dispense: 12 suppository; Refill: [place], [place]  [facility]

## 2018-03-31 ENCOUNTER — Encounter
Admission: RE | Disposition: A | Discharge status: Home / Self Care | Payer: Self-pay | Source: Ambulatory Visit | Attending: Ophthalmology

## 2018-03-31 ENCOUNTER — Ambulatory Visit
Admission: RE | Admit: 2018-03-31 | Discharge: 2018-03-31 | Disposition: A | Discharge status: Home / Self Care | Payer: Federal, State, Local not specified - PPO | Source: Ambulatory Visit | Attending: Ophthalmology | Admitting: Ophthalmology

## 2018-03-31 ENCOUNTER — Ambulatory Visit: Payer: Federal, State, Local not specified - PPO | Admitting: Anesthesiology

## 2018-03-31 ENCOUNTER — Encounter: Payer: Self-pay | Admitting: Ophthalmology

## 2018-03-31 DIAGNOSIS — H2511 Age-related nuclear cataract, right eye: Secondary | ICD-10-CM | POA: Diagnosis present

## 2018-03-31 DIAGNOSIS — Z87891 Personal history of nicotine dependence: Secondary | ICD-10-CM | POA: Insufficient documentation

## 2018-03-31 DIAGNOSIS — G4730 Sleep apnea, unspecified: Secondary | ICD-10-CM | POA: Insufficient documentation

## 2018-03-31 HISTORY — PX: CATARACT EXTRACTION W/PHACO: SHX586

## 2018-03-31 HISTORY — DX: Scoliosis, unspecified: M41.9

## 2018-03-31 SURGERY — PHACOEMULSIFICATION, CATARACT, WITH IOL INSERTION
Anesthesia: Monitor Anesthesia Care | Site: Eye | Laterality: Right | Wound class: Clean

## 2018-03-31 MED ORDER — MOXIFLOXACIN HCL 0.5 % OP SOLN
OPHTHALMIC | Status: DC | PRN
  Administered 2018-03-31: 0.2 mL via OPHTHALMIC

## 2018-03-31 MED ORDER — BRIMONIDINE TARTRATE-TIMOLOL 0.2-0.5 % OP SOLN
OPHTHALMIC | Status: DC | PRN
  Administered 2018-03-31: 1 [drp] via OPHTHALMIC

## 2018-03-31 MED ORDER — NA HYALUR & NA CHOND-NA HYALUR 0.4-0.35 ML IO KIT
PACK | INTRAOCULAR | Status: DC | PRN
  Administered 2018-03-31: 1 mL via INTRAOCULAR

## 2018-03-31 MED ORDER — BSS IO SOLN
INTRAOCULAR | Status: DC | PRN
  Administered 2018-03-31: 62 mL via OPHTHALMIC

## 2018-03-31 MED ORDER — ARMC OPHTHALMIC DILATING DROPS
1.0000 "application " | OPHTHALMIC | Status: DC | PRN
  Administered 2018-03-31 (×3): 1 via OPHTHALMIC

## 2018-03-31 MED ORDER — FENTANYL CITRATE (PF) 100 MCG/2ML IJ SOLN
INTRAMUSCULAR | Status: DC | PRN
  Administered 2018-03-31: 50 ug via INTRAVENOUS

## 2018-03-31 MED ORDER — MOXIFLOXACIN HCL 0.5 % OP SOLN
1.0000 [drp] | OPHTHALMIC | Status: DC | PRN
  Administered 2018-03-31 (×3): 1 [drp] via OPHTHALMIC

## 2018-03-31 MED ORDER — ONDANSETRON HCL 4 MG/2ML IJ SOLN: 4.0000 mg | Freq: Once | INTRAMUSCULAR | Status: DC | PRN

## 2018-03-31 MED ORDER — LACTATED RINGERS IV SOLN: 10.0000 mL/h | INTRAVENOUS | Status: DC

## 2018-03-31 MED ORDER — MIDAZOLAM HCL 2 MG/2ML IJ SOLN
INTRAMUSCULAR | Status: DC | PRN
  Administered 2018-03-31 (×2): 1 mg via INTRAVENOUS

## 2018-03-31 MED ORDER — LIDOCAINE HCL (PF) 2 % IJ SOLN
INTRAOCULAR | Status: DC | PRN
  Administered 2018-03-31: 1 mL

## 2018-03-31 SURGICAL SUPPLY — 20 items
CANNULA ANT/CHMB 27G (MISCELLANEOUS) ×1 IMPLANT
CANNULA ANT/CHMB 27GA (MISCELLANEOUS) ×2 IMPLANT
GLOVE SURG LX 7.5 STRW (GLOVE) ×1
GLOVE SURG LX STRL 7.5 STRW (GLOVE) ×1 IMPLANT
GLOVE SURG TRIUMPH 8.0 PF LTX (GLOVE) ×2 IMPLANT
GOWN STRL REUS W/ TWL LRG LVL3 (GOWN DISPOSABLE) ×2 IMPLANT
GOWN STRL REUS W/TWL LRG LVL3 (GOWN DISPOSABLE) ×4
LENS IOL TECNIS ITEC 24.5 (Intraocular Lens) ×1 IMPLANT
MARKER SKIN DUAL TIP RULER LAB (MISCELLANEOUS) ×2 IMPLANT
NDL FILTER BLUNT 18X1 1/2 (NEEDLE) ×1 IMPLANT
NEEDLE FILTER BLUNT 18X 1/2SAF (NEEDLE) ×1
NEEDLE FILTER BLUNT 18X1 1/2 (NEEDLE) ×1 IMPLANT
PACK CATARACT BRASINGTON (MISCELLANEOUS) ×2 IMPLANT
PACK EYE AFTER SURG (MISCELLANEOUS) ×2 IMPLANT
PACK OPTHALMIC (MISCELLANEOUS) ×2 IMPLANT
SYR 3ML LL SCALE MARK (SYRINGE) ×2 IMPLANT
SYR 5ML LL (SYRINGE) ×2 IMPLANT
SYR TB 1ML LUER SLIP (SYRINGE) ×2 IMPLANT
WATER STERILE IRR 500ML POUR (IV SOLUTION) ×2 IMPLANT
WIPE NON LINTING 3.25X3.25 (MISCELLANEOUS) ×2 IMPLANT

## 2018-03-31 NOTE — Anesthesia Postprocedure Evaluation (Signed)
Anesthesia Post Note  Patient: Elizabeth [name]  Procedure(s) Performed: CATARACT EXTRACTION PHACO AND INTRAOCULAR LENS PLACEMENT (IOC) RIGHT (Right Eye)  Patient location during evaluation: PACU Anesthesia Type: MAC Level of consciousness: awake and alert Pain management: pain level controlled Vital Signs Assessment: post-procedure vital signs reviewed and stable Respiratory status: spontaneous breathing, nonlabored ventilation, respiratory function stable and patient connected to nasal cannula oxygen Cardiovascular status: stable and blood pressure returned to baseline Postop Assessment: no apparent nausea or vomiting Anesthetic complications: no    Veda Canning

## 2018-03-31 NOTE — Anesthesia Procedure Notes (Signed)
Procedure Name: MAC Performed by: Yaiza Palazzola, CRNA Pre-anesthesia Checklist: Patient identified, Emergency Drugs available, Suction available, Timeout performed and Patient being monitored Patient Re-evaluated:Patient Re-evaluated prior to induction Oxygen Delivery Method: Nasal cannula Placement Confirmation: positive ETCO2       

## 2018-03-31 NOTE — Op Note (Signed)
LOCATION:  [place]   PREOPERATIVE DIAGNOSIS:    Nuclear sclerotic cataract right eye. H25.11   POSTOPERATIVE DIAGNOSIS:  Nuclear sclerotic cataract right eye.     PROCEDURE:  Phacoemusification with posterior chamber intraocular lens placement of the right eye   LENS:   Implant Name Type Inv. Item Serial No. Manufacturer Lot No. LRB No. Used  LENS IOL DIOP 24.5 - [id number] Intraocular Lens LENS IOL DIOP 24.5 [id number]  Right 1        ULTRASOUND TIME: 16 % of 1 minutes, 18 seconds.  CDE 12.5   SURGEON:  Wyonia Hough, MD   ANESTHESIA:  Topical with tetracaine drops and 2% Xylocaine jelly, augmented with 1% preservative-free intracameral lidocaine.    COMPLICATIONS:  None.   DESCRIPTION OF PROCEDURE:  The patient was identified in the holding room and transported to the operating room and placed in the supine position under the operating microscope.  The right eye was identified as the operative eye and it was prepped and draped in the usual sterile ophthalmic fashion.   A 1 millimeter clear-corneal paracentesis was made at the 12:00 position.  0.5 ml of preservative-free 1% lidocaine was injected into the anterior chamber. The anterior chamber was filled with Viscoat viscoelastic.  A 2.4 millimeter keratome was used to make a near-clear corneal incision at the 9:00 position.  A curvilinear capsulorrhexis was made with a cystotome and capsulorrhexis forceps.  Balanced salt solution was used to hydrodissect and hydrodelineate the nucleus.   Phacoemulsification was then used in stop and chop fashion to remove the lens nucleus and epinucleus.  The remaining cortex was then removed using the irrigation and aspiration handpiece. Provisc was then placed into the capsular bag to distend it for lens placement.  A lens was then injected into the capsular bag.  The remaining viscoelastic was aspirated.   Wounds were hydrated with balanced salt solution.  The anterior  chamber was inflated to a physiologic pressure with balanced salt solution.  No wound leaks were noted. Vigamox 0.2 ml of a 1mg  per ml solution was injected into the anterior chamber for a dose of 0.2 mg of intracameral antibiotic at the completion of the case.   Timolol and Brimonidine drops were applied to the eye.  The patient was taken to the recovery room in stable condition without complications of anesthesia or surgery.   Sanel Stemmer 03/31/2018, 12:12 PM

## 2018-03-31 NOTE — Transfer of Care (Signed)
Immediate Anesthesia Transfer of Care Note  Patient: Elizabeth [name]  Procedure(s) Performed: CATARACT EXTRACTION PHACO AND INTRAOCULAR LENS PLACEMENT (IOC) RIGHT (Right Eye)  Patient Location: PACU  Anesthesia Type: MAC  Level of Consciousness: awake, alert  and patient cooperative  Airway and Oxygen Therapy: Patient Spontanous Breathing and Patient connected to supplemental oxygen  Post-op Assessment: Post-op Vital signs reviewed, Patient's Cardiovascular Status Stable, Respiratory Function Stable, Patent Airway and No signs of Nausea or vomiting  Post-op Vital Signs: Reviewed and stable  Complications: No apparent anesthesia complications

## 2018-03-31 NOTE — Anesthesia Preprocedure Evaluation (Signed)
Anesthesia Evaluation  Patient identified by MRN, date of birth, ID band Patient awake    Reviewed: Allergy & Precautions, NPO status , Patient's Chart, lab work & pertinent test results  Airway Mallampati: II  TM Distance: >3 FB     Dental   Pulmonary former smoker,    breath sounds clear to auscultation       Cardiovascular negative cardio ROS   Rhythm:Regular Rate:Normal     Neuro/Psych  Headaches, Anxiety Depression Restless leg syndrome    GI/Hepatic GERD  ,  Endo/Other    Renal/GU      Musculoskeletal  (+) Arthritis , Osteopenia    Abdominal   Peds  Hematology   Anesthesia Other Findings   Reproductive/Obstetrics                             Anesthesia Physical Anesthesia Plan  ASA: II  Anesthesia Plan: MAC   Post-op Pain Management:    Induction:   PONV Risk Score and Plan:   Airway Management Planned: Natural Airway and Nasal Cannula  Additional Equipment:   Intra-op Plan:   Post-operative Plan:   Informed Consent: I have reviewed the patients History and Physical, chart, labs and discussed the procedure including the risks, benefits and alternatives for the proposed anesthesia with the patient or authorized representative who has indicated his/her understanding and acceptance.     Plan Discussed with: CRNA  Anesthesia Plan Comments:         Anesthesia Quick Evaluation

## 2018-03-31 NOTE — H&P (Signed)
The History and Physical notes are on paper, have been signed, and are to be scanned. The patient remains stable and unchanged from the H&P.   Previous H&P reviewed, patient examined, and there are no changes.  Tameem Pullara 03/31/2018 11:19 AM

## 2018-04-14 ENCOUNTER — Other Ambulatory Visit: Payer: Self-pay

## 2018-04-14 ENCOUNTER — Encounter: Payer: Self-pay | Admitting: *Deleted

## 2018-04-15 NOTE — Discharge Instructions (Signed)

## 2018-04-16 ENCOUNTER — Ambulatory Visit: Payer: Federal, State, Local not specified - PPO | Admitting: Anesthesiology

## 2018-04-16 ENCOUNTER — Encounter
Admission: RE | Disposition: A | Discharge status: Home / Self Care | Payer: Self-pay | Source: Ambulatory Visit | Attending: Ophthalmology

## 2018-04-16 ENCOUNTER — Ambulatory Visit
Admission: RE | Admit: 2018-04-16 | Discharge: 2018-04-16 | Disposition: A | Discharge status: Home / Self Care | Payer: Federal, State, Local not specified - PPO | Source: Ambulatory Visit | Attending: Ophthalmology | Admitting: Ophthalmology

## 2018-04-16 DIAGNOSIS — Z87891 Personal history of nicotine dependence: Secondary | ICD-10-CM | POA: Diagnosis not present

## 2018-04-16 DIAGNOSIS — H2512 Age-related nuclear cataract, left eye: Secondary | ICD-10-CM | POA: Insufficient documentation

## 2018-04-16 DIAGNOSIS — G4730 Sleep apnea, unspecified: Secondary | ICD-10-CM | POA: Diagnosis not present

## 2018-04-16 DIAGNOSIS — Z96641 Presence of right artificial hip joint: Secondary | ICD-10-CM | POA: Insufficient documentation

## 2018-04-16 HISTORY — PX: CATARACT EXTRACTION W/PHACO: SHX586

## 2018-04-16 SURGERY — PHACOEMULSIFICATION, CATARACT, WITH IOL INSERTION
Anesthesia: Monitor Anesthesia Care | Site: Eye | Laterality: Left | Wound class: Clean

## 2018-04-16 MED ORDER — MIDAZOLAM HCL 2 MG/2ML IJ SOLN
INTRAMUSCULAR | Status: DC | PRN
  Administered 2018-04-16: 2 mg via INTRAVENOUS

## 2018-04-16 MED ORDER — EPINEPHRINE PF 1 MG/ML IJ SOLN
INTRAOCULAR | Status: DC | PRN
  Administered 2018-04-16: 59 mL via OPHTHALMIC

## 2018-04-16 MED ORDER — MOXIFLOXACIN HCL 0.5 % OP SOLN
1.0000 [drp] | OPHTHALMIC | Status: DC | PRN
  Administered 2018-04-16 (×3): 1 [drp] via OPHTHALMIC

## 2018-04-16 MED ORDER — ACETAMINOPHEN 160 MG/5ML PO SOLN: 325.0000 mg | ORAL | Status: DC | PRN

## 2018-04-16 MED ORDER — LIDOCAINE HCL (PF) 2 % IJ SOLN
INTRAOCULAR | Status: DC | PRN
  Administered 2018-04-16: 1 mL

## 2018-04-16 MED ORDER — CYCLOPENTOLATE HCL 2 % OP SOLN
1.0000 [drp] | OPHTHALMIC | Status: DC | PRN
  Administered 2018-04-16 (×4): 1 [drp] via OPHTHALMIC

## 2018-04-16 MED ORDER — MOXIFLOXACIN HCL 0.5 % OP SOLN
OPHTHALMIC | Status: DC | PRN
  Administered 2018-04-16: 0.2 mL via OPHTHALMIC

## 2018-04-16 MED ORDER — BRIMONIDINE TARTRATE-TIMOLOL 0.2-0.5 % OP SOLN
OPHTHALMIC | Status: DC | PRN
  Administered 2018-04-16: 1 [drp] via OPHTHALMIC

## 2018-04-16 MED ORDER — ACETAMINOPHEN 325 MG PO TABS: 325.0000 mg | ORAL_TABLET | ORAL | Status: DC | PRN

## 2018-04-16 MED ORDER — PHENYLEPHRINE HCL 10 % OP SOLN
1.0000 [drp] | OPHTHALMIC | Status: DC | PRN
  Administered 2018-04-16 (×4): 1 [drp] via OPHTHALMIC

## 2018-04-16 MED ORDER — TETRACAINE HCL 0.5 % OP SOLN
1.0000 [drp] | OPHTHALMIC | Status: DC | PRN
  Administered 2018-04-16 (×2): 1 [drp] via OPHTHALMIC

## 2018-04-16 MED ORDER — FENTANYL CITRATE (PF) 100 MCG/2ML IJ SOLN
INTRAMUSCULAR | Status: DC | PRN
  Administered 2018-04-16 (×2): 50 ug via INTRAVENOUS

## 2018-04-16 MED ORDER — NA HYALUR & NA CHOND-NA HYALUR 0.4-0.35 ML IO KIT
PACK | INTRAOCULAR | Status: DC | PRN
  Administered 2018-04-16: 1 mL via INTRAOCULAR

## 2018-04-16 MED ORDER — LACTATED RINGERS IV SOLN: 1000.0000 mL | INTRAVENOUS | Status: DC

## 2018-04-16 SURGICAL SUPPLY — 27 items

## 2018-04-16 NOTE — Anesthesia Preprocedure Evaluation (Signed)
Anesthesia Evaluation  Patient identified by MRN, date of birth, ID band Patient awake    Reviewed: Allergy & Precautions, H&P , NPO status , Patient's Chart, lab work & pertinent test results, reviewed documented beta blocker date and time   Airway Mallampati: I  TM Distance: >3 FB Neck ROM: full    Dental no notable dental hx.    Pulmonary former smoker,    Pulmonary exam normal breath sounds clear to auscultation       Cardiovascular Exercise Tolerance: Good negative cardio ROS Normal cardiovascular exam Rhythm:regular Rate:Normal     Neuro/Psych  Headaches, PSYCHIATRIC DISORDERS Anxiety Depression    GI/Hepatic Neg liver ROS, GERD  Controlled,  Endo/Other  negative endocrine ROS  Renal/GU negative Renal ROS  negative genitourinary   Musculoskeletal   Abdominal   Peds  Hematology negative hematology ROS (+)   Anesthesia Other Findings   Reproductive/Obstetrics negative OB ROS                             Anesthesia Physical Anesthesia Plan  ASA: II  Anesthesia Plan: MAC   Post-op Pain Management:    Induction:   PONV Risk Score and Plan:   Airway Management Planned:   Additional Equipment:   Intra-op Plan:   Post-operative Plan:   Informed Consent: I have reviewed the patients History and Physical, chart, labs and discussed the procedure including the risks, benefits and alternatives for the proposed anesthesia with the patient or authorized representative who has indicated his/her understanding and acceptance.   Dental Advisory Given  Plan Discussed with: CRNA  Anesthesia Plan Comments:         Anesthesia Quick Evaluation

## 2018-04-16 NOTE — H&P (Signed)
The History and Physical notes are on paper, have been signed, and are to be scanned. The patient remains stable and unchanged from the H&P.   Previous H&P reviewed, patient examined, and there are no changes.  Elizabeth [name] 04/16/2018 9:13 AM

## 2018-04-16 NOTE — Transfer of Care (Signed)
Immediate Anesthesia Transfer of Care Note  Patient: Elizabeth [name]  Procedure(s) Performed: CATARACT EXTRACTION PHACO AND INTRAOCULAR LENS PLACEMENT (IOC)  LEFT (Left Eye)  Patient Location: PACU  Anesthesia Type: MAC  Level of Consciousness: awake, alert  and patient cooperative  Airway and Oxygen Therapy: Patient Spontanous Breathing and Patient connected to supplemental oxygen  Post-op Assessment: Post-op Vital signs reviewed, Patient's Cardiovascular Status Stable, Respiratory Function Stable, Patent Airway and No signs of Nausea or vomiting  Post-op Vital Signs: Reviewed and stable  Complications: No apparent anesthesia complications

## 2018-04-16 NOTE — Anesthesia Procedure Notes (Signed)
Procedure Name: MAC Date/Time: 04/16/2018 10:14 AM Performed by: Janna Arch, CRNA Pre-anesthesia Checklist: Patient identified, Emergency Drugs available, Suction available and Patient being monitored Patient Re-evaluated:Patient Re-evaluated prior to induction Oxygen Delivery Method: Nasal cannula

## 2018-04-16 NOTE — Op Note (Signed)
OPERATIVE NOTE  Elizabeth [name] [id number] 04/16/2018   PREOPERATIVE DIAGNOSIS:  Nuclear sclerotic cataract left eye. H25.12   POSTOPERATIVE DIAGNOSIS:    Nuclear sclerotic cataract left eye.     PROCEDURE:  Phacoemusification with posterior chamber intraocular lens placement of the left eye   LENS:   Implant Name Type Inv. Item Serial No. Manufacturer Lot No. LRB No. Used  LENS IOL DIOP 24.5 - [id number] Intraocular Lens LENS IOL DIOP 24.5 [id number]  Left 1        ULTRASOUND TIME: 15  % of 1 minutes 9 seconds, CDE 10.1  SURGEON:  Wyonia Hough, MD   ANESTHESIA:  Topical with tetracaine drops and 2% Xylocaine jelly, augmented with 1% preservative-free intracameral lidocaine.    COMPLICATIONS:  None.   DESCRIPTION OF PROCEDURE:  The patient was identified in the holding room and transported to the operating room and placed in the supine position under the operating microscope.  The left eye was identified as the operative eye and it was prepped and draped in the usual sterile ophthalmic fashion.   A 1 millimeter clear-corneal paracentesis was made at the 1:30 position.  0.5 ml of preservative-free 1% lidocaine was injected into the anterior chamber.  The anterior chamber was filled with Viscoat viscoelastic.  A 2.4 millimeter keratome was used to make a near-clear corneal incision at the 10:30 position.  .  A curvilinear capsulorrhexis was made with a cystotome and capsulorrhexis forceps.  Balanced salt solution was used to hydrodissect and hydrodelineate the nucleus.   Phacoemulsification was then used in stop and chop fashion to remove the lens nucleus and epinucleus.  The remaining cortex was then removed using the irrigation and aspiration handpiece. Provisc was then placed into the capsular bag to distend it for lens placement.  A lens was then injected into the capsular bag.  The remaining viscoelastic was aspirated.   Wounds were hydrated with balanced  salt solution.  The anterior chamber was inflated to a physiologic pressure with balanced salt solution.  No wound leaks were noted. Vigamox 0.2 ml of a 1mg  per ml solution was injected into the anterior chamber for a dose of 0.2 mg of intracameral antibiotic at the completion of the case.   Timolol and Brimonidine drops were applied to the eye.  The patient was taken to the recovery room in stable condition without complications of anesthesia or surgery.  Elizabeth [name] 04/16/2018, 10:30 AM

## 2018-04-16 NOTE — Anesthesia Postprocedure Evaluation (Signed)
Anesthesia Post Note  Patient: Elizabeth [name]  Procedure(s) Performed: CATARACT EXTRACTION PHACO AND INTRAOCULAR LENS PLACEMENT (IOC)  LEFT (Left Eye)  Patient location during evaluation: PACU Anesthesia Type: MAC Level of consciousness: awake and alert Pain management: pain level controlled Vital Signs Assessment: post-procedure vital signs reviewed and stable Respiratory status: spontaneous breathing, nonlabored ventilation, respiratory function stable and patient connected to nasal cannula oxygen Cardiovascular status: stable and blood pressure returned to baseline Postop Assessment: no apparent nausea or vomiting Anesthetic complications: no    Trecia Rogers

## 2018-04-17 ENCOUNTER — Encounter: Payer: Self-pay | Admitting: Ophthalmology

## 2018-07-01 ENCOUNTER — Other Ambulatory Visit: Payer: Self-pay | Admitting: Family Medicine

## 2018-07-01 DIAGNOSIS — F419 Anxiety disorder, unspecified: Secondary | ICD-10-CM

## 2018-08-19 ENCOUNTER — Other Ambulatory Visit: Payer: Self-pay | Admitting: Family Medicine

## 2018-08-19 DIAGNOSIS — I201 Angina pectoris with documented spasm: Secondary | ICD-10-CM

## 2018-08-19 MED ORDER — NITROGLYCERIN 400 MCG/SPRAY TL AERS: INHALATION_SPRAY | 2 refills | Status: DC

## 2018-08-19 NOTE — Telephone Encounter (Signed)
Pt needing a refill on: Nitroglycerin (NITROMIST) 400 MCG/SPRAY AERS  Please fill at:  [id number] - [place], [place] - [address] [phone] (Phone) [phone] (Fax)   Thanks, [facility]

## 2018-08-28 ENCOUNTER — Telehealth: Payer: Self-pay | Admitting: Family Medicine

## 2018-08-28 MED ORDER — NITROGLYCERIN 0.4 MG SL SUBL: 0.4000 mg | SUBLINGUAL_TABLET | SUBLINGUAL | 5 refills | Status: DC | PRN

## 2018-08-28 NOTE — Telephone Encounter (Signed)
Pt states [facility] doesn't carry the spray and pt is wanting to change to the nitroGLYCERIN tablets instead of spray.

## 2018-08-28 NOTE — Telephone Encounter (Signed)
Please advise 

## 2018-11-02 ENCOUNTER — Other Ambulatory Visit: Payer: Self-pay | Admitting: Family Medicine

## 2018-11-02 DIAGNOSIS — F419 Anxiety disorder, unspecified: Secondary | ICD-10-CM

## 2018-11-03 ENCOUNTER — Other Ambulatory Visit: Payer: Self-pay | Admitting: Pulmonary Disease

## 2018-11-21 ENCOUNTER — Telehealth: Payer: Self-pay

## 2018-11-21 NOTE — Telephone Encounter (Signed)
Patient called saying that she was diagnosed with the flu while she was in [place], but they did not treat her. She reports that she is still not feeling well and that she will be back in town tomorrow. Advised patient that she must be seen. She reports that she will come to Saturday clinic if we are still open when she comes home.

## 2018-11-22 ENCOUNTER — Ambulatory Visit (INDEPENDENT_AMBULATORY_CARE_PROVIDER_SITE_OTHER): Payer: Federal, State, Local not specified - PPO | Admitting: Family Medicine

## 2018-11-22 ENCOUNTER — Encounter: Payer: Self-pay | Admitting: Family Medicine

## 2018-11-22 VITALS — BP 133/81 | HR 61 | Temp 97.8°F | Resp 16 | Wt 165.8 lb

## 2018-11-22 DIAGNOSIS — J329 Chronic sinusitis, unspecified: Secondary | ICD-10-CM | POA: Diagnosis not present

## 2018-11-22 DIAGNOSIS — J40 Bronchitis, not specified as acute or chronic: Secondary | ICD-10-CM | POA: Diagnosis not present

## 2018-11-22 MED ORDER — AZITHROMYCIN 250 MG PO TABS: ORAL_TABLET | ORAL | 0 refills | Status: AC

## 2018-11-22 MED ORDER — PREDNISONE 10 MG PO TABS: ORAL_TABLET | ORAL | 0 refills | Status: AC

## 2018-11-22 NOTE — Patient Instructions (Signed)
.   Please review the attached list of medications and notify my office if there are any errors.   . Please bring all of your medications to every appointment so we can make sure that our medication list is the same as yours.   

## 2018-11-22 NOTE — Progress Notes (Signed)
Patient: Elizabeth [name] Female    DOB: March 11, 1951   67 y.o.   MRN: [id number] Visit Date: 11/22/2018  Today's Provider: Lelon Huh, MD   Chief Complaint  Patient presents with  . Shortness of Breath   Subjective:     HPI Patient here today c/o shortness of breath for several days. She was seen at [facility] ER on 11/16/2018 and was diagnosed with URI. POC flu A/B was negative, but had follow up PCR testing positive for flu B and RSV as below. Patient reports that her ears have been feeling full, cough, diarrhea and feeling tired. Patient denies fever, sore throat, head ache, or runny nose even when she was seen at ER. She has had more pressure in her sinuses.  Patient reports she was given an inhaler and last used last night. Shortness of breath she had last week is much better, but otherwise still feel terrible.   Respiratory pathogen panel Positive for Respiratory Syncytial Virus ----------------------- (A) Comment: Specimen Information Specimen Source: Nares Specimen Site: Left   Respiratory pathogen panel Positive for Influenza B virus ----------------------- Negative for all other pathogens tested: Negative for Adenovirus Negative for Coronavirus HKU1 Negative for Coronavirus NL63 Negative for Coronavirus 229E Negative for Coronavirus OC43 Negative for Human Metapneumovirus Negative for Rhinovirus/Enterovirus Negative for Influenza A Negative for Influenza A/H1 Negative for Influenza A/H3 Negative for Influenza A/H1-2009 Negative for Parainfluenza Virus 1 Negative for Parainfluenza Virus 2 Negative for Parainfluenza Virus 3 Negative for Parainfluenza Virus 4 Negative for Bordetella pertussis Negative for Chlamydophila pneumoniae Negative for Mycoplasma pneumoniae      Allergies  Allergen Reactions  . Zostavax [Zoster Vaccine Live] Rash  . Naproxen Hives  . Penicillins Hives  . Zoloft [Sertraline Hcl] Hives  . Latex Itching and  Rash    Condoms     Current Outpatient Medications:  .  albuterol (PROVENTIL HFA;VENTOLIN HFA) 108 (90 Base) MCG/ACT inhaler, Inhale 2 puffs into the lungs every 6 (six) hours as needed for wheezing or shortness of breath., Disp: , Rfl:  .  ALPRAZolam (XANAX) 0.5 MG tablet, TAKE 3 TABLETS BY MOUTH EVERY NIGHT AT BEDTIME, Disp: 90 tablet, Rfl: 5 .  fluticasone (FLONASE) 50 MCG/ACT nasal spray, SHAKE LIQUID AND USE 2 SPRAYS IN EACH NOSTRIL EVERY DAY, Disp: 48 g, Rfl: 1 .  Multiple Vitamins-Minerals (MULTIVITAMIN WITH MINERALS) tablet, Take 1 tablet by mouth 2 (two) times daily. , Disp: , Rfl:  .  nitroGLYCERIN (NITROSTAT) 0.4 MG SL tablet, Place 1 tablet (0.4 mg total) under the tongue every 5 (five) minutes as needed for chest pain. Go to ER if third tablet is necessary, Disp: 30 tablet, Rfl: 5 .  omeprazole (PRILOSEC) 20 MG capsule, TAKE 1 CAPSULE BY MOUTH ONCE DAILY, Disp: 90 capsule, Rfl: 2 .  pramipexole (MIRAPEX) 1 MG tablet, Take 1 tablet (1 mg total) by mouth at bedtime., Disp: 30 tablet, Rfl: 12 .  valACYclovir (VALTREX) 500 MG tablet, Take 1 tablet (500 mg total) by mouth daily. Reported on 10/24/2015, Disp: 30 tablet, Rfl: 11  Review of Systems  Constitutional: Positive for fatigue.  HENT: Positive for congestion and ear pain.   Respiratory: Positive for cough, chest tightness and shortness of breath.     Social History   Tobacco Use  . Smoking status: Former Smoker    Packs/day: 0.50    Years: 6.00    Pack years: 3.00    Types: Cigarettes    Last attempt to quit:  01/07/2012    Years since quitting: 6.8  . Smokeless tobacco: Never Used  . Tobacco comment: vapors occasionally  Substance Use Topics  . Alcohol use: Yes    Alcohol/week: 5.0 standard drinks    Types: 5 Glasses of wine per week    Comment: red wine      Objective:   BP 133/81 (BP Location: Left Arm, Patient Position: Sitting, Cuff Size: Normal)   Pulse 61   Temp 97.8 F (36.6 C) (Oral)   Resp 16   Wt  165 lb 12.8 oz (75.2 kg)   SpO2 98%   BMI 29.37 kg/m  Vitals:   11/22/18 0957  BP: 133/81  Pulse: 61  Resp: 16  Temp: 97.8 F (36.6 C)  TempSrc: Oral  SpO2: 98%  Weight: 165 lb 12.8 oz (75.2 kg)     Physical Exam  General Appearance:    Alert, cooperative, no distress  HENT:   TMs obstructed with cerumen, neck without nodes, throat normal without erythema or exudate, frontal sinus tender and nasal mucosa congested  Eyes:    PERRL, conjunctiva/corneas clear, EOM's intact       Lungs:     Clear to auscultation bilaterally, respirations unlabored  Heart:    Regular rate and rhythm  Neurologic:   Awake, alert, oriented x 3. No apparent focal neurological           defect.          Assessment & Plan    1. Sinusitis, unspecified chronicity, unspecified location   2. Bronchitis  She did test positive for flu B at outside hospital, but she reports she did have flu vaccine this season and did not have typical flu sx. She also tested positive for RSV which was likely initial cause of her symptoms, but has now likely transitioned to bacterial infection. Will cover with Zpack and 6 day prednisone taper.    Lelon Huh, MD  [facility]

## 2018-12-04 ENCOUNTER — Telehealth: Payer: Self-pay | Admitting: Family Medicine

## 2018-12-04 NOTE — Telephone Encounter (Signed)
Office visit scheduled for tomorrow at 9:20am.

## 2018-12-04 NOTE — Telephone Encounter (Signed)
Pt was in a week ago Saturday.  She was given prednisone and z pak.  She fininshed both. She has been having stomach problems since she finished the medication  She is having cramping in her stomach  CB#  [phone]  Thanks Con Memos

## 2018-12-05 ENCOUNTER — Ambulatory Visit
Admission: RE | Admit: 2018-12-05 | Discharge: 2018-12-05 | Disposition: A | Discharge status: Home / Self Care | Payer: Federal, State, Local not specified - PPO | Attending: Family Medicine | Admitting: Family Medicine

## 2018-12-05 ENCOUNTER — Encounter: Payer: Self-pay | Admitting: Family Medicine

## 2018-12-05 ENCOUNTER — Ambulatory Visit: Payer: Federal, State, Local not specified - PPO | Admitting: Family Medicine

## 2018-12-05 ENCOUNTER — Ambulatory Visit
Admission: RE | Admit: 2018-12-05 | Discharge: 2018-12-05 | Disposition: A | Discharge status: Home / Self Care | Payer: Federal, State, Local not specified - PPO | Source: Ambulatory Visit | Attending: Family Medicine | Admitting: Family Medicine

## 2018-12-05 VITALS — BP 106/62 | HR 72 | Temp 98.7°F | Resp 16 | Wt 166.0 lb

## 2018-12-05 DIAGNOSIS — R109 Unspecified abdominal pain: Secondary | ICD-10-CM | POA: Diagnosis present

## 2018-12-05 DIAGNOSIS — R1030 Lower abdominal pain, unspecified: Secondary | ICD-10-CM

## 2018-12-05 DIAGNOSIS — K5790 Diverticulosis of intestine, part unspecified, without perforation or abscess without bleeding: Secondary | ICD-10-CM | POA: Diagnosis not present

## 2018-12-05 LAB — POCT URINALYSIS DIPSTICK
Bilirubin, UA: NEGATIVE
Glucose, UA: NEGATIVE
Ketones, UA: NEGATIVE
Leukocytes, UA: NEGATIVE
Nitrite, UA: NEGATIVE
PH UA: 6 (ref 5.0–8.0)
Protein, UA: NEGATIVE
Spec Grav, UA: 1.025 (ref 1.010–1.025)
Urobilinogen, UA: 0.2 E.U./dL

## 2018-12-05 NOTE — Progress Notes (Signed)
Patient: Elizabeth [name] Female    DOB: [birth year]   68 y.o.   MRN: [id number] Visit Date: 12/05/2018  Today's Provider: Lelon Huh, MD   Chief Complaint  Patient presents with  . Abdominal Pain   Subjective:     HPI   Patient was seen on 11/22/2018 for respiratory infection and prescribed and 6 day prednisone. She developed a cold sore and started herself on valacyclovir. She states at the end of the course of valacyclovir she started having severe cramping. She took some OTC Senna Tea and had BM with very hard stool requiring her to strain, but abdominal subside after words. Cramping came back a few days later and were very severe yesterday. States they felt like a menstrual cramp.  She had hard BM last night and pain has since mostly resolved.  Has not had any fever or diarrhea. Pain is all across lower abdomen, no upper abdominal pain. She does report long history of IBS and has been under much more stress lately.   Allergies  Allergen Reactions  . Zostavax [Zoster Vaccine Live] Rash  . Naproxen Hives  . Penicillins Hives  . Zoloft [Sertraline Hcl] Hives  . Latex Itching and Rash    Condoms     Current Outpatient Medications:  .  albuterol (PROVENTIL HFA;VENTOLIN HFA) 108 (90 Base) MCG/ACT inhaler, Inhale 2 puffs into the lungs every 6 (six) hours as needed for wheezing or shortness of breath., Disp: , Rfl:  .  ALPRAZolam (XANAX) 0.5 MG tablet, TAKE 3 TABLETS BY MOUTH EVERY NIGHT AT BEDTIME, Disp: 90 tablet, Rfl: 5 .  fluticasone (FLONASE) 50 MCG/ACT nasal spray, SHAKE LIQUID AND USE 2 SPRAYS IN EACH NOSTRIL EVERY DAY, Disp: 48 g, Rfl: 1 .  Multiple Vitamins-Minerals (MULTIVITAMIN WITH MINERALS) tablet, Take 1 tablet by mouth 2 (two) times daily. , Disp: , Rfl:  .  nitroGLYCERIN (NITROSTAT) 0.4 MG SL tablet, Place 1 tablet (0.4 mg total) under the tongue every 5 (five) minutes as needed for chest pain. Go to ER if third tablet is necessary, Disp: 30 tablet,  Rfl: 5 .  omeprazole (PRILOSEC) 20 MG capsule, TAKE 1 CAPSULE BY MOUTH ONCE DAILY, Disp: 90 capsule, Rfl: 2 .  pramipexole (MIRAPEX) 1 MG tablet, Take 1 tablet (1 mg total) by mouth at bedtime., Disp: 30 tablet, Rfl: 12 .  valACYclovir (VALTREX) 500 MG tablet, Take 1 tablet (500 mg total) by mouth daily. Reported on 10/24/2015, Disp: 30 tablet, Rfl: 11  Review of Systems  Constitutional: Positive for fatigue. Negative for activity change.  Respiratory: Negative for cough and shortness of breath.   Cardiovascular: Negative for chest pain and leg swelling.  Gastrointestinal: Positive for abdominal pain and nausea. Negative for anal bleeding, blood in stool, constipation, rectal pain and vomiting.  Neurological: Negative for dizziness.    Social History   Tobacco Use  . Smoking status: Former Smoker    Packs/day: 0.50    Years: 6.00    Pack years: 3.00    Types: Cigarettes    Last attempt to quit: 01/07/2012    Years since quitting: 6.9  . Smokeless tobacco: Never Used  . Tobacco comment: vapors occasionally  Substance Use Topics  . Alcohol use: Yes    Alcohol/week: 5.0 standard drinks    Types: 5 Glasses of wine per week    Comment: red wine      Objective:   BP 106/62 (BP Location: Left Arm, Patient Position: Sitting, Cuff Size:  Normal)   Pulse 72   Temp 98.7 F (37.1 C)   Resp 16   Wt 166 lb (75.3 kg)   SpO2 97%   BMI 29.41 kg/m  Vitals:   12/05/18 0926  BP: 106/62  Pulse: 72  Resp: 16  Temp: 98.7 F (37.1 C)  SpO2: 97%  Weight: 166 lb (75.3 kg)     General Appearance:    Alert, cooperative, no distress  Eyes:    PERRL, conjunctiva/corneas clear, EOM's intact       Lungs:     Clear to auscultation bilaterally, respirations unlabored  Heart:    Regular rate and rhythm  Abdomen:   bowel sounds present and normal in all 4 quadrants, soft, round or nondistended. No CVA tenderness. Mild tenderness left upper abdomen. Diffuse lower abdominal tenderness, slightly more  so on left. No masses   Results for orders placed or performed in visit on 12/05/18  POCT urinalysis dipstick  Result Value Ref Range   Color, UA yellow    Clarity, UA clear    Glucose, UA Negative Negative   Bilirubin, UA negative    Ketones, UA negative    Spec Grav, UA 1.025 1.010 - 1.025   Blood, UA non hemolyzed trace    pH, UA 6.0 5.0 - 8.0   Protein, UA Negative Negative   Urobilinogen, UA 0.2 0.2 or 1.0 E.U./dL   Nitrite, UA negative    Leukocytes, UA Negative Negative       Assessment & Plan    1. Lower abdominal pain Seems to be relieved by BM and has had hard infrequent stool. Suspect pains are from constipation, likely from IBS flare. Recommend OTC metamucil. Last colonoscopy was six years and remarkable only for diverticulosis.  - DG Abd 2 Views; Future - POCT urinalysis dipstick  Consider GI referral if BMs do not regulate with fiber supplementation.   2. Diverticulosis Pain resolved today after having BM last night. If recurs consider empiric treatment for diverticulitis      Lelon Huh, MD  [facility]

## 2018-12-05 NOTE — Patient Instructions (Addendum)
.   Please review the attached list of medications and notify my office if there are any errors.   . Start taking OTC Metamucil powder every day  . Go to the [facility] on [facility] for abdominal Xray

## 2018-12-08 ENCOUNTER — Telehealth: Payer: Self-pay

## 2018-12-08 NOTE — Telephone Encounter (Signed)
-----   Message from Birdie Sons, MD sent at 12/05/2018  4:12 PM EST ----- X-ray is normal. Go ahead and start daily daily metamucil powder for IBS and to regular BMs. If not much better in 7-10 days, then will need referral to GI for colonoscopy.

## 2018-12-08 NOTE — Telephone Encounter (Signed)
Pt advised.   Thanks,   -Markice Torbert  

## 2018-12-17 ENCOUNTER — Other Ambulatory Visit: Payer: Self-pay | Admitting: Family Medicine

## 2018-12-17 DIAGNOSIS — G2581 Restless legs syndrome: Secondary | ICD-10-CM

## 2018-12-28 ENCOUNTER — Encounter: Payer: Self-pay | Admitting: Family Medicine

## 2018-12-31 ENCOUNTER — Encounter: Payer: Self-pay | Admitting: Family Medicine

## 2019-02-02 ENCOUNTER — Other Ambulatory Visit: Payer: Self-pay | Admitting: Family Medicine

## 2019-02-02 DIAGNOSIS — Z1231 Encounter for screening mammogram for malignant neoplasm of breast: Secondary | ICD-10-CM

## 2019-02-25 ENCOUNTER — Other Ambulatory Visit: Payer: Self-pay

## 2019-02-25 ENCOUNTER — Ambulatory Visit (INDEPENDENT_AMBULATORY_CARE_PROVIDER_SITE_OTHER): Payer: Federal, State, Local not specified - PPO | Admitting: Family Medicine

## 2019-02-25 VITALS — BP 118/72 | HR 75 | Temp 98.0°F | Resp 16 | Wt 173.6 lb

## 2019-02-25 DIAGNOSIS — Z1239 Encounter for other screening for malignant neoplasm of breast: Secondary | ICD-10-CM

## 2019-02-25 DIAGNOSIS — Z0000 Encounter for general adult medical examination without abnormal findings: Secondary | ICD-10-CM

## 2019-02-25 DIAGNOSIS — Z23 Encounter for immunization: Secondary | ICD-10-CM

## 2019-02-25 DIAGNOSIS — E2839 Other primary ovarian failure: Secondary | ICD-10-CM

## 2019-02-25 DIAGNOSIS — Z20822 Contact with and (suspected) exposure to covid-19: Secondary | ICD-10-CM

## 2019-02-25 DIAGNOSIS — R6889 Other general symptoms and signs: Secondary | ICD-10-CM

## 2019-02-25 NOTE — Progress Notes (Signed)
Patient: Elizabeth [name], Female    DOB: [birth year], 68 y.o.   MRN: [id number] Visit Date: 02/25/2019  Today's Provider: Lelon Huh, MD   Chief Complaint  Patient presents with   Annual Exam   Subjective:     Annual physical exam Elizabeth [name] is a 68 y.o. female who presents today for health maintenance and complete physical. She feels more tired lately.  She reports that she is not exercising. She reports she is sleeping well. Patient states tha she needs a medication refill on Valtrex. She also would like to discuss being tested for COVID-19, after her hospitalization in February. She states that she does not feel like herself. Was in wedding in [place] and states everyone at the wedding got sick and thinks it may have been Covid.   -----------------------------------------------------------------   Review of Systems  Constitutional: Negative for chills, diaphoresis and fever.  HENT: Negative for congestion, ear discharge, ear pain, hearing loss, nosebleeds, sore throat and tinnitus.   Eyes: Negative for photophobia, pain, discharge and redness.  Respiratory: Negative for cough, shortness of breath, wheezing and stridor.   Cardiovascular: Negative for chest pain, palpitations and leg swelling.  Gastrointestinal: Negative for abdominal pain, blood in stool, constipation, diarrhea, nausea and vomiting.  Endocrine: Negative for polydipsia.  Genitourinary: Negative for dysuria, flank pain, frequency, hematuria and urgency.  Musculoskeletal: Negative for back pain, myalgias and neck pain.  Skin: Negative for rash.  Allergic/Immunologic: Negative for environmental allergies.  Neurological: Negative for dizziness, tremors, seizures, weakness and headaches.  Hematological: Does not bruise/bleed easily.  Psychiatric/Behavioral: Negative for hallucinations and suicidal ideas. The patient is not nervous/anxious.     Social History      She  reports that  she quit smoking about 7 years ago. Her smoking use included cigarettes. She has a 3.00 pack-year smoking history. She has never used smokeless tobacco. She reports current alcohol use of about 5.0 standard drinks of alcohol per week. She reports that she does not use drugs.       Social History   Socioeconomic History   Marital status: Married    Spouse name: Not on file   Number of children: Not on file   Years of education: Not on file   Highest education level: Not on file  Occupational History   Not on file  Social Needs   Financial resource strain: Not on file   Food insecurity:    Worry: Not on file    Inability: Not on file   Transportation needs:    Medical: Not on file    Non-medical: Not on file  Tobacco Use   Smoking status: Former Smoker    Packs/day: 0.50    Years: 6.00    Pack years: 3.00    Types: Cigarettes    Last attempt to quit: 01/07/2012    Years since quitting: 7.1   Smokeless tobacco: Never Used   Tobacco comment: vapors occasionally  Substance and Sexual Activity   Alcohol use: Yes    Alcohol/week: 5.0 standard drinks    Types: 5 Glasses of wine per week    Comment: red wine   Drug use: No   Sexual activity: Yes    Birth control/protection: Post-menopausal  Lifestyle   Physical activity:    Days per week: Not on file    Minutes per session: Not on file   Stress: Not on file  Relationships   Social connections:    Talks on phone: Not  on file    Gets together: Not on file    Attends religious service: Not on file    Active member of club or organization: Not on file    Attends meetings of clubs or organizations: Not on file    Relationship status: Not on file  Other Topics Concern   Not on file  Social History Narrative   Not on file    Past Medical History:  Diagnosis Date   Constipation due to opioid therapy    Degenerative joint disease (DJD) of hip    GERD (gastroesophageal reflux disease)    Osteopenia     Restless leg syndrome    takes Mirapex and xanax   Scoliosis    SUI (stress urinary incontinence, female)    Vaso-vagal reaction    after back surgery     Patient Active Problem List   Diagnosis Date Noted   Diverticulosis 12/05/2018   GERD (gastroesophageal reflux disease) 01/30/2017   Insomnia 01/30/2017   Anxiety 09/07/2015   Angina pectoris with documented spasm ([place]) 09/07/2015   Elevated WBC count 09/07/2015   H/O total hip arthroplasty 09/07/2015   HLD (hyperlipidemia) 09/07/2015   Headache, migraine 09/07/2015   Osteoarthritis 09/07/2015   Arthropathy of temporomandibular joint 09/07/2015   Olecranon bursitis 09/07/2015   Major depressive disorder, single episode 09/07/2015   Restless leg 07/08/2015   Primary osteoarthritis of right hip 01/19/2015   DJD (degenerative joint disease) 01/18/2015   Left shoulder pain 10/05/2013    Past Surgical History:  Procedure Laterality Date   APPENDECTOMY  1963   BACK SURGERY     BREAST CYST ASPIRATION Right 1988   BREAST EXCISIONAL BIOPSY Right 1990   benign x 3   BREAST SURGERY     CATARACT EXTRACTION W/PHACO Right 03/31/2018   Procedure: CATARACT EXTRACTION PHACO AND INTRAOCULAR LENS PLACEMENT ([place]) RIGHT;  Surgeon: Leandrew Koyanagi, MD;  Location: [facility];  Service: Ophthalmology;  Laterality: Right;  Latex sensitivity   CATARACT EXTRACTION W/PHACO Left 04/16/2018   Procedure: CATARACT EXTRACTION PHACO AND INTRAOCULAR LENS PLACEMENT ([place])  LEFT;  Surgeon: Leandrew Koyanagi, MD;  Location: [facility];  Service: Ophthalmology;  Laterality: Left;   EYE SURGERY     Lasik   FOOT SURGERY Bilateral    HYSTEROTOMY     LAMINECTOMY  2006   ROTATOR CUFF REPAIR Bilateral    TONSILLECTOMY     age 82   TOTAL HIP ARTHROPLASTY Right 01/18/2015   Procedure: RIGHT TOTAL HIP ARTHROPLASTY ANTERIOR APPROACH;  Surgeon: Renette Butters, MD;  Location: [facility];  Service: Orthopedics;   Laterality: Right;   TUBAL LIGATION      Family History        Family Status  Relation Name Status   Mother  Deceased at age 96       lung cancer   Father  Deceased at age 35       murdered   Brother  Alive   Daughter  Alive   Neg Hx  (Not Specified)        Her family history includes Alcohol abuse in her brother and mother; Breast cancer (age of onset: 98) in her daughter; Cancer in her brother, daughter, father, and mother; Diabetes in her father; Migraines in her mother. There is no history of Bladder Cancer or Kidney cancer.      Allergies  Allergen Reactions   Zostavax [Zoster Vaccine Live] Rash   Naproxen Hives   Penicillins Hives  Zoloft [Sertraline Hcl] Hives   Latex Itching and Rash    Condoms     Current Outpatient Medications:    ALPRAZolam (XANAX) 0.5 MG tablet, TAKE 3 TABLETS BY MOUTH EVERY NIGHT AT BEDTIME, Disp: 90 tablet, Rfl: 5   fluticasone (FLONASE) 50 MCG/ACT nasal spray, SHAKE LIQUID AND USE 2 SPRAYS IN EACH NOSTRIL EVERY DAY, Disp: 48 g, Rfl: 1   Multiple Vitamins-Minerals (MULTIVITAMIN WITH MINERALS) tablet, Take 1 tablet by mouth 2 (two) times daily. , Disp: , Rfl:    nitroGLYCERIN (NITROSTAT) 0.4 MG SL tablet, Place 1 tablet (0.4 mg total) under the tongue every 5 (five) minutes as needed for chest pain. Go to ER if third tablet is necessary, Disp: 30 tablet, Rfl: 5   omeprazole (PRILOSEC) 20 MG capsule, TAKE 1 CAPSULE BY MOUTH ONCE DAILY, Disp: 90 capsule, Rfl: 2   pramipexole (MIRAPEX) 1 MG tablet, TAKE 1 TABLET(1 MG) BY MOUTH AT BEDTIME, Disp: 30 tablet, Rfl: 12   valACYclovir (VALTREX) 500 MG tablet, Take 1 tablet (500 mg total) by mouth daily. Reported on 10/24/2015, Disp: 30 tablet, Rfl: 11   albuterol (PROVENTIL HFA;VENTOLIN HFA) 108 (90 Base) MCG/ACT inhaler, Inhale 2 puffs into the lungs every 6 (six) hours as needed for wheezing or shortness of breath., Disp: , Rfl:    Patient Care Team: Birdie Sons, MD as PCP -  General (Family Medicine) Edwin Dada (Inactive) as Referring Physician (Cardiology)    Objective:    Vitals: BP 118/72 (BP Location: Right Arm, Patient Position: Sitting, Cuff Size: Normal)    Pulse 75    Temp 98 F (36.7 C) (Oral)    Resp 16    Wt 173 lb 9.6 oz (78.7 kg)    BMI 30.75 kg/m    Vitals:   02/25/19 0902  BP: 118/72  Pulse: 75  Resp: 16  Temp: 98 F (36.7 C)  TempSrc: Oral  Weight: 173 lb 9.6 oz (78.7 kg)     Physical Exam   General Appearance:    Alert, cooperative, no distress, appears stated age  Head:    Normocephalic, without obvious abnormality, atraumatic  Eyes:    PERRL, conjunctiva/corneas clear, EOM's intact, fundi    benign, both eyes  Ears:    Normal TM's and external ear canals, both ears  Nose:   Nares normal, septum midline, mucosa normal, no drainage    or sinus tenderness  Throat:   Lips, mucosa, and tongue normal; teeth and gums normal  Neck:   Supple, symmetrical, trachea midline, no adenopathy;    thyroid:  no enlargement/tenderness/nodules; no carotid   bruit or JVD  Back:     Symmetric, no curvature, ROM normal, no CVA tenderness  Lungs:     Clear to auscultation bilaterally, respirations unlabored  Chest Wall:    No tenderness or deformity   Heart:    Regular rate and rhythm, S1 and S2 normal, no murmur, rub   or gallop  Breast Exam:    normal appearance, no masses or tenderness  Abdomen:     Soft, non-tender, bowel sounds active all four quadrants,    no masses, no organomegaly  Pelvic:    deferred  Extremities:   Extremities normal, atraumatic, no cyanosis or edema  Pulses:   2+ and symmetric all extremities  Skin:   Skin color, texture, turgor normal, no rashes or lesions  Lymph nodes:   Cervical, supraclavicular, and axillary nodes normal  Neurologic:   CNII-XII intact, normal strength,  sensation and reflexes    throughout     The 10-year ASCVD risk score Mikey Bussing DC Jr., et al., 2013) is: 4.9%   Values used to calculate the  score:     Age: 38 years     Sex: Female     Is Non-Hispanic African American: No     Diabetic: No     Tobacco smoker: No     Systolic Blood Pressure: 824 mmHg     Is BP treated: No     HDL Cholesterol: 77 mg/dL     Total Cholesterol: 216 mg/dL   Depression Screen PHQ 2/9 Scores 02/25/2019 01/30/2017 12/20/2015  PHQ - 2 Score 0 0 0  PHQ- 9 Score - 0 -       Assessment & Plan:     Routine Health Maintenance and Physical Exam  Exercise Activities and Dietary recommendations Goals   None     Immunization History  Administered Date(s) Administered   Influenza Split 06/03/2010   Influenza, High Dose Seasonal PF 09/24/2017   Pneumococcal Conjugate-13 09/24/2017   Tdap 05/18/2009   Zoster 09/27/2014    Health Maintenance  Topic Date Due   PNA vac Low Risk Adult (2 of 2 - PPSV23) 09/24/2018   INFLUENZA VACCINE  05/09/2019   TETANUS/TDAP  05/19/2019   MAMMOGRAM  02/25/2020   COLONOSCOPY  05/06/2023   DEXA SCAN  Completed   Hepatitis C Screening  Completed     Discussed health benefits of physical activity, and encouraged her to engage in regular exercise appropriate for her age and condition.    --------------------------------------------------------------------  1. Annual physical exam  - Comprehensive metabolic panel - Lipid panel - Td : Tetanus/diphtheria >7yo Preservative  free  2. Estrogen deficiency  - DG Bone Density; Future  3. Breast cancer screening Annual mammograms  4. Need for pneumococcal vaccination Counseled regarding p neumonia vaccine schedule  5. Need for prophylactic vaccination using tetanus and diphtheria toxoids adsorbed (Td) vaccine Counseled regarding tetanus vaccine schedule  6. Suspected Covid-19 Virus Infection  - SAR CoV2 Serology (COVID 19)AB(IGG)IA - SARS-CoV-2 Antibody, IgM   Lelon Huh, MD  [place]

## 2019-02-25 NOTE — Patient Instructions (Signed)
.   Please review the attached list of medications and notify my office if there are any errors.   . Please bring all of your medications to every appointment so we can make sure that our medication list is the same as yours.   

## 2019-02-26 ENCOUNTER — Ambulatory Visit: Payer: Federal, State, Local not specified - PPO | Admitting: Podiatry

## 2019-02-26 ENCOUNTER — Encounter: Payer: Self-pay | Admitting: Podiatry

## 2019-02-26 ENCOUNTER — Ambulatory Visit (INDEPENDENT_AMBULATORY_CARE_PROVIDER_SITE_OTHER): Payer: Federal, State, Local not specified - PPO

## 2019-02-26 VITALS — Temp 97.3°F

## 2019-02-26 DIAGNOSIS — M76822 Posterior tibial tendinitis, left leg: Secondary | ICD-10-CM

## 2019-02-26 DIAGNOSIS — M722 Plantar fascial fibromatosis: Secondary | ICD-10-CM | POA: Diagnosis not present

## 2019-02-26 LAB — COMPREHENSIVE METABOLIC PANEL
ALT: 24 IU/L (ref 0–32)
AST: 19 IU/L (ref 0–40)
Albumin/Globulin Ratio: 2.2 (ref 1.2–2.2)
Albumin: 4.6 g/dL (ref 3.8–4.8)
Alkaline Phosphatase: 72 IU/L (ref 39–117)
BUN/Creatinine Ratio: 21 (ref 12–28)
BUN: 16 mg/dL (ref 8–27)
Bilirubin Total: 0.5 mg/dL (ref 0.0–1.2)
CO2: 23 mmol/L (ref 20–29)
Calcium: 10.1 mg/dL (ref 8.7–10.3)
Chloride: 102 mmol/L (ref 96–106)
Creatinine, Ser: 0.77 mg/dL (ref 0.57–1.00)
GFR calc Af Amer: 93 mL/min/{1.73_m2} (ref >59)
GFR calc non Af Amer: 81 mL/min/{1.73_m2} (ref >59)
Globulin, Total: 2.1 g/dL (ref 1.5–4.5)
Glucose: 91 mg/dL (ref 65–99)
Potassium: 4.3 mmol/L (ref 3.5–5.2)
Sodium: 140 mmol/L (ref 134–144)
Total Protein: 6.7 g/dL (ref 6.0–8.5)

## 2019-02-26 LAB — LIPID PANEL
Chol/HDL Ratio: 2.4 ratio (ref 0.0–4.4)
Cholesterol, Total: 211 mg/dL — ABNORMAL HIGH (ref 100–199)
HDL: 88 mg/dL (ref >39)
LDL Calculated: 99 mg/dL (ref 0–99)
Triglycerides: 121 mg/dL (ref 0–149)
VLDL Cholesterol Cal: 24 mg/dL (ref 5–40)

## 2019-02-26 MED ORDER — METHYLPREDNISOLONE 4 MG PO TBPK: ORAL_TABLET | ORAL | 0 refills | Status: DC

## 2019-02-26 NOTE — Progress Notes (Signed)
She presents today chief complaint of pain to the left plantar medial longitudinal arch area proximally I am states is been like this for the past 3 to 4 weeks she is getting some swelling she states that she fell through the floor of her house.  Objective: Vital signs are stable alert and oriented x3.  Pulses are palpable.  Foot is intact deep tendon reflexes are intact.  Muscle strength is normal symmetrical.  She has pain on palpation medial [place] tubercle and distal to that area beneath the navicular tuberosity.  Posterior tibial tendon is intact.  Assessment: Plantar fasciitis medial intratumoral arch left.  Plan: At this point started her on methylprednisolone and injected the area with 20 mg Kenalog 5 mg Marcaine after sterile Betadine skin prep.  Tolerated procedure well without complication.  Follow-up with her on an as-needed basis.

## 2019-02-27 LAB — SAR COV2 SEROLOGY (COVID19)AB(IGG),IA: SARS-CoV-2 Ab, IgG: NEGATIVE

## 2019-02-27 LAB — SARS-COV-2 ANTIBODY, IGM: SARS-CoV-2 Antibody, IgM: NEGATIVE

## 2019-03-03 ENCOUNTER — Other Ambulatory Visit: Payer: Self-pay

## 2019-03-03 NOTE — Telephone Encounter (Signed)
Erroneous encounter

## 2019-04-08 DIAGNOSIS — C50911 Malignant neoplasm of unspecified site of right female breast: Secondary | ICD-10-CM

## 2019-04-08 HISTORY — DX: Malignant neoplasm of unspecified site of right female breast: C50.911

## 2019-04-09 ENCOUNTER — Other Ambulatory Visit: Payer: Self-pay

## 2019-04-09 ENCOUNTER — Ambulatory Visit
Admission: RE | Admit: 2019-04-09 | Discharge: 2019-04-09 | Disposition: A | Discharge status: Home / Self Care | Payer: Federal, State, Local not specified - PPO | Source: Ambulatory Visit | Attending: Family Medicine | Admitting: Family Medicine

## 2019-04-09 DIAGNOSIS — E2839 Other primary ovarian failure: Secondary | ICD-10-CM | POA: Insufficient documentation

## 2019-04-13 ENCOUNTER — Ambulatory Visit
Admission: RE | Admit: 2019-04-13 | Discharge: 2019-04-13 | Disposition: A | Discharge status: Home / Self Care | Payer: Federal, State, Local not specified - PPO | Source: Ambulatory Visit | Attending: Family Medicine | Admitting: Family Medicine

## 2019-04-13 ENCOUNTER — Other Ambulatory Visit: Payer: Self-pay | Admitting: Family Medicine

## 2019-04-13 ENCOUNTER — Telehealth: Payer: Self-pay

## 2019-04-13 DIAGNOSIS — R928 Other abnormal and inconclusive findings on diagnostic imaging of breast: Secondary | ICD-10-CM

## 2019-04-13 DIAGNOSIS — N6489 Other specified disorders of breast: Secondary | ICD-10-CM

## 2019-04-13 DIAGNOSIS — R921 Mammographic calcification found on diagnostic imaging of breast: Secondary | ICD-10-CM

## 2019-04-13 DIAGNOSIS — Z1231 Encounter for screening mammogram for malignant neoplasm of breast: Secondary | ICD-10-CM | POA: Insufficient documentation

## 2019-04-13 NOTE — Telephone Encounter (Signed)
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 Be sure to take 2000mg  vitamin d3 every day, do weight bearing exercises at least twice a week. Can probably hold off on starting any medications at this time, but need to repeat BMD in  Years.

## 2019-04-13 NOTE — Telephone Encounter (Signed)
Pt advised.   Thanks,   -Loriel Diehl  

## 2019-04-22 ENCOUNTER — Ambulatory Visit
Admission: RE | Admit: 2019-04-22 | Discharge: 2019-04-22 | Disposition: A | Discharge status: Home / Self Care | Payer: Federal, State, Local not specified - PPO | Source: Ambulatory Visit | Attending: Family Medicine | Admitting: Family Medicine

## 2019-04-22 ENCOUNTER — Other Ambulatory Visit: Payer: Self-pay

## 2019-04-22 ENCOUNTER — Other Ambulatory Visit: Payer: Self-pay | Admitting: Family Medicine

## 2019-04-22 DIAGNOSIS — R921 Mammographic calcification found on diagnostic imaging of breast: Secondary | ICD-10-CM | POA: Insufficient documentation

## 2019-04-22 DIAGNOSIS — R928 Other abnormal and inconclusive findings on diagnostic imaging of breast: Secondary | ICD-10-CM

## 2019-04-22 DIAGNOSIS — N6489 Other specified disorders of breast: Secondary | ICD-10-CM | POA: Diagnosis present

## 2019-04-24 ENCOUNTER — Telehealth: Payer: Self-pay | Admitting: Family Medicine

## 2019-04-24 NOTE — Telephone Encounter (Signed)
No, dental procedures are high risk for getting bacteria into blood stream because there are so many bacteria in mouth, which can't be sterilized. The skin is sterilized before biopsy so the risk of bacteria getting into blood stream is nearly zero.

## 2019-04-24 NOTE — Telephone Encounter (Signed)
LMTCB 04/24/2019   Thanks,   -Mickel Baas

## 2019-04-24 NOTE — Telephone Encounter (Signed)
Pt is going this Monday to get a biopsy of her breast at [facility] and she wants to know if she needs to take an antibiotic before that procedure is done.  She says when she has anything dental done they have to give her an antibiotic  Please advise  [phone]  Con Memos

## 2019-04-24 NOTE — Telephone Encounter (Signed)
Pt advised.

## 2019-04-27 ENCOUNTER — Other Ambulatory Visit: Payer: Self-pay

## 2019-04-27 ENCOUNTER — Ambulatory Visit
Admission: RE | Admit: 2019-04-27 | Discharge: 2019-04-27 | Disposition: A | Discharge status: Home / Self Care | Payer: Federal, State, Local not specified - PPO | Source: Ambulatory Visit | Attending: Family Medicine | Admitting: Family Medicine

## 2019-04-27 DIAGNOSIS — N6489 Other specified disorders of breast: Secondary | ICD-10-CM | POA: Diagnosis present

## 2019-04-27 DIAGNOSIS — R928 Other abnormal and inconclusive findings on diagnostic imaging of breast: Secondary | ICD-10-CM | POA: Insufficient documentation

## 2019-04-27 HISTORY — PX: BREAST BIOPSY: SHX20

## 2019-04-30 ENCOUNTER — Encounter: Payer: Self-pay | Admitting: *Deleted

## 2019-04-30 DIAGNOSIS — C50911 Malignant neoplasm of unspecified site of right female breast: Secondary | ICD-10-CM

## 2019-04-30 NOTE — Progress Notes (Signed)
[facility]  [address], [address] [place], [place] [address] Phone: [phone]  Fax: [phone]   Clinic Day:  05/01/2019  Referring physician: Manual Meier*  Chief Complaint: Elizabeth [name] is a 68 y.o. female with right breast cancer who is referred in consultation by Dr. Lelon Huh for assessment and management.   HPI:  The patient has had prior right breast biopsies. She underwent a lumpectomy in the 1990s for 2-3 benign masses in right breast.  Biopsy was performed in [place], [place].  She performs monthly breast exams.  She undergoes yearly mammogram (last 02/24/2018). Bilateral screening mammogram on 04/13/2019 revealed a possible asymmetry and calcifications in the right breast.  Exam revealed no palpable abnormality.   Right diagnostic mammogram on 04/22/2019 showed persistent architectural distortion in the right breast upper outer quadrant, posterior depth, which represents postsurgical scarring (scar marker). There was a separate asymmetry in the upper right breast, posterior depth, measuring 6-7 mm. There was no evidence of right axillary adenopathy.  Ultrasound revealed no suspicious masses or shadowing lesions.  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 ER, PR, and HER-2/neu are pending.   Bone density on 04/09/2019 revealed osteoporosis with a T-score of -2.5 in the forearm radius. She was advised to increase her calcium and vitamin D intake in addition to exercising.   Symptomatically, she feels "great." She denies any fevers, sweats, or weight loss. She denies any headaches, visual changes, cough, shortness of breath, or chest pain. She denies any nausea, vomiting, diarrhea, or bloody or black stools. She denies any bone or joint issues. She has a history of right hip replacement.   She  notes soreness in her right breast following the biopsy.   Menarche was at 68yo. She entered menopause in her late 6s and it was completed at about 63-53yo.  She had hormone therapy for 10 years off and on during menopause. Her OB-GYN stopped hormone replacement therapy, and she reports she "hemorrhaged" after 4-5 days. She then underwent a hysterotomy, with no further intervention. She was on birth control for short time when she was younger. She has 3 children. She did not breastfeed her children.   She notes a family history of stage III breast cancer in her daughter at 94yo.  She underwent a mastectomy and adjuvant chemotherapy. She is unsure if her daughter underwent genetic testing. She is interested in genetic testing. Her mother died of suspected lung cancer. Her father had prostate cancer. She has two brothers with skin cancer. She denies any family history of ovarian or colon cancer. She is up to date on her colonoscopy.   She will see Dr. Olean Ree for a surgical consultation this afternoon.    Past Medical History:  Diagnosis Date   Constipation due to opioid therapy    Degenerative joint disease (DJD) of hip    GERD (gastroesophageal reflux disease)    Osteopenia    Restless leg syndrome    takes Mirapex and xanax   Scoliosis    SUI (stress urinary incontinence, female)    Vaso-vagal reaction    after back surgery    Past Surgical History:  Procedure Laterality Date   APPENDECTOMY  1963   BACK SURGERY     BREAST BIOPSY Right 04/27/2019   Affirm Bx "X" clip-path pending   BREAST CYST ASPIRATION Right 1988   BREAST EXCISIONAL BIOPSY  Right 1990   benign x 3   BREAST SURGERY     CATARACT EXTRACTION W/PHACO Right 03/31/2018   Procedure: CATARACT EXTRACTION PHACO AND INTRAOCULAR LENS PLACEMENT ([place]) RIGHT;  Surgeon: Leandrew Koyanagi, MD;  Location: [place];  Service: Ophthalmology;  Laterality: Right;  Latex sensitivity   CATARACT  EXTRACTION W/PHACO Left 04/16/2018   Procedure: CATARACT EXTRACTION PHACO AND INTRAOCULAR LENS PLACEMENT ([place])  LEFT;  Surgeon: Leandrew Koyanagi, MD;  Location: [facility];  Service: Ophthalmology;  Laterality: Left;   EYE SURGERY     Lasik   FOOT SURGERY Bilateral    HYSTEROTOMY     LAMINECTOMY  2006   ROTATOR CUFF REPAIR Bilateral    TONSILLECTOMY     age 26   TOTAL HIP ARTHROPLASTY Right 01/18/2015   Procedure: RIGHT TOTAL HIP ARTHROPLASTY ANTERIOR APPROACH;  Surgeon: Renette Butters, MD;  Location: [facility];  Service: Orthopedics;  Laterality: Right;   TUBAL LIGATION      Family History  Problem Relation Age of Onset   Alcohol abuse Mother    Cancer Mother        lung   Migraines Mother    Cancer Father        prostate   Diabetes Father    Alcohol abuse Brother    Cancer Brother        skin   Cancer Daughter        breast   Breast cancer Daughter 76   Bladder Cancer Neg Hx    Kidney cancer Neg Hx     Social History:  reports that she quit smoking about 7 years ago. Her smoking use included cigarettes. She has a 3.00 pack-year smoking history. She has never used smokeless tobacco. She reports current alcohol use of about 5.0 standard drinks of alcohol per week. She reports that she does not use drugs. She denies any known exposure to radiation or toxins. She is originally from [place], but lived in [place], [place] for many years and has been in [place] since 1999 after she got promoted working for [employer]. She is currently retired. She has several grandchildren and 10 great grandchildren. She lives in [place].  The patient is alone today.  Allergies:  Allergies  Allergen Reactions   Zostavax [Zoster Vaccine Live] Rash   Naproxen Hives   Penicillins Hives   Zoloft [Sertraline Hcl] Hives   Latex Itching and Rash    Condoms    Current Medications: Current Outpatient Medications  Medication Sig Dispense Refill   ALPRAZolam  (XANAX) 0.5 MG tablet TAKE 3 TABLETS BY MOUTH EVERY NIGHT AT BEDTIME 90 tablet 5   Multiple Vitamins-Minerals (MULTIVITAMIN WITH MINERALS) tablet Take 1 tablet by mouth 2 (two) times daily.      omeprazole (PRILOSEC) 20 MG capsule TAKE 1 CAPSULE BY MOUTH ONCE DAILY 90 capsule 2   pramipexole (MIRAPEX) 1 MG tablet TAKE 1 TABLET(1 MG) BY MOUTH AT BEDTIME 30 tablet 12   valACYclovir (VALTREX) 500 MG tablet Take 1 tablet (500 mg total) by mouth daily. Reported on 10/24/2015 30 tablet 11   albuterol (PROVENTIL HFA;VENTOLIN HFA) 108 (90 Base) MCG/ACT inhaler Inhale 2 puffs into the lungs every 6 (six) hours as needed for wheezing or shortness of breath.     cetirizine (ZYRTEC) 10 MG tablet Take 1 tablet by mouth daily.     fluticasone (FLONASE) 50 MCG/ACT nasal spray SHAKE LIQUID AND USE 2 SPRAYS IN EACH NOSTRIL EVERY DAY (Patient not taking: Reported  on 05/01/2019) 48 g 1   methylPREDNISolone (MEDROL DOSEPAK) 4 MG TBPK tablet 6 day dose pack - take as directed (Patient not taking: Reported on 05/01/2019) 21 tablet 0   nitroGLYCERIN (NITROSTAT) 0.4 MG SL tablet Place 1 tablet (0.4 mg total) under the tongue every 5 (five) minutes as needed for chest pain. Go to ER if third tablet is necessary (Patient not taking: Reported on 05/01/2019) 30 tablet 5   No current facility-administered medications for this visit.     Review of Systems  Constitutional: Negative.  Negative for chills, diaphoresis, fever, malaise/fatigue and weight loss.       Feels "great."  HENT: Negative.  Negative for congestion, hearing loss, sinus pain and sore throat.   Eyes: Negative.  Negative for blurred vision.  Respiratory: Negative.  Negative for cough, shortness of breath and wheezing.   Cardiovascular: Negative.  Negative for chest pain, palpitations, orthopnea, leg swelling and PND.  Gastrointestinal: Negative.  Negative for abdominal pain, blood in stool, constipation, diarrhea, melena, nausea and vomiting.    Genitourinary: Negative.  Negative for dysuria, frequency, hematuria and urgency.  Musculoskeletal: Negative.  Negative for back pain, joint pain and myalgias.       Soreness in right breast at site of biopsy  Skin: Negative.  Negative for rash.  Neurological: Negative.  Negative for dizziness, tingling, sensory change, weakness and headaches.  Endo/Heme/Allergies: Negative.  Does not bruise/bleed easily.  Psychiatric/Behavioral: Negative.  Negative for depression, memory loss and substance abuse. The patient is not nervous/anxious and does not have insomnia.   All other systems reviewed and are negative.  Performance status (ECOG): 0  Vitals Blood pressure (!) 145/76, pulse 75, temperature (!) 97.2 F (36.2 C), temperature source Tympanic, resp. rate 18, height '5\' 1"'$  (1.549 m), weight 172 lb 2.9 oz (78.1 kg), SpO2 100 %.   Physical Exam  Constitutional: She is oriented to person, place, and time. She appears well-developed and well-nourished. No distress.  HENT:  Head: Normocephalic and atraumatic.  Mouth/Throat: Oropharynx is clear and moist. No oropharyngeal exudate.  Long brown hair braided. Mask.  Eyes: Pupils are equal, round, and reactive to light. Conjunctivae and EOM are normal. No scleral icterus.  Brown eyes.   Neck: Normal range of motion. Neck supple. No JVD present.  Cardiovascular: Normal rate, regular rhythm and normal heart sounds.  No murmur heard. Pulmonary/Chest: Effort normal and breath sounds normal. No respiratory distress. She has no wheezes. She has no rales. Right breast exhibits no inverted nipple, no mass (no palpable mass), no nipple discharge, no skin change (fibrocystic changes in upper quadrant, small hematoma at site of biopsy; steri strips inferiorly) and no tenderness. Left breast exhibits no inverted nipple, no mass, no nipple discharge, no skin change (upper outer quadrant fibrocystic changes) and no tenderness.  Abdominal: Soft. Bowel sounds are  normal. She exhibits no distension and no mass. There is no abdominal tenderness. There is no rebound and no guarding.  Musculoskeletal: Normal range of motion.        General: No edema.  Lymphadenopathy:    She has no cervical adenopathy.    She has no axillary adenopathy.       Right: No inguinal and no supraclavicular adenopathy present.       Left: No inguinal and no supraclavicular adenopathy present.  Neurological: She is alert and oriented to person, place, and time.  Skin: Skin is warm and dry. No rash noted. She is not diaphoretic. No erythema. No pallor.  Psychiatric: She has a normal mood and affect. Her behavior is normal. Judgment and thought content normal.  Nursing note and vitals reviewed.   No visits with results within 3 Day(s) from this visit.  Latest known visit with results is:  Hospital Outpatient Visit on 04/27/2019  Component Date Value Ref Range Status   SURGICAL PATHOLOGY 04/27/2019    Final                   Value:Surgical Pathology CASE: [id number] PATIENT: Elizabeth [name] Surgical Pathology Report     SPECIMEN SUBMITTED: A. Breast, right  CLINICAL HISTORY: Screening detected distortion in the right breast  PRE-OPERATIVE DIAGNOSIS: Possible surgical scaring vs CSL r/o malignancy  POST-OPERATIVE DIAGNOSIS: X shaped biopsy marker clip deployed post biopsy     DIAGNOSIS: A. Breast, right, upper outer quadrant; stereotactic core biopsies: -  INVASIVE MAMMARY CARCINOMA, WITH DUCTAL AND LOBULAR FEATURES.  Size of invasive carcinoma: 7 mm in this sample Histologic grade of invasive carcinoma: Grade 2                      Glandular/tubular differentiation score: 3                      Nuclear pleomorphism score: 2                      Mitotic rate score: 1                      Total score: 6 Ductal carcinoma in situ: Present, cribriform type Lymphovascular invasion: Not identified  ER/PR/HER2: Immunohistochemistry will be  performed on block A1, with re                         flex to [place] for HER2 2+. The results will be reported in an addendum. Findings are reported to Dannial Monarch in Dr. Shelia Media office on 04/28/2019.   GROSS DESCRIPTION: A. Labeled: Right breast asymmetry distortion stereotactic biopsy (per requisition, upper outer quadrant) Received: in a formalin-filled [place] collection device Accompanying specimen radiograph: No Time/Date in fixative: Collected at 9:07 AM and placed into formalin at 9:10 AM on 04/27/2019. Cold ischemic time: Less than 5 minutes Total fixation time: 11.5 hours Core pieces: Multiple Measurement: Aggregate, 5.0 x 2.0 x 0.3 cm Description / comments: Received are needle core biopsy fragments of tan-yellow, fibrofatty tissue Inked: Blue Entirely submitted in cassette(s): 1-4   Final Diagnosis performed by Raynelle Bring, MD.   Electronically signed 04/28/2019 2:49:28PM The electronic signature indicates that the named Attending Pathologist has evaluated the specimen  Technical component performed                          at [facility], [address], [place], [place] [address] Lab: [phone] Dir: Rush Farmer, MD, MMM  Professional component performed at [facility], [facility], [place], [place], [place] [address] Lab: [phone] Dir: Dellia Nims. Rubinas, MD     Assessment:  Launi Asencio is a 68 y.o. female with right breast cancer s/p ultrasound guided biopsy on 04/27/2019.  Pathology revealed a 7 mm grade II invasive mammary carcinoma with ductal and lobular features.  DCIS was present.  ER, PR, and HER-2/neu are pending.   Right diagnostic mammogram on 04/22/2019 showed persistent architectural distortion in the right breast upper outer quadrant, posterior depth, which represents  postsurgical scarring (scar marker). There was a separate asymmetry in the upper right breast, posterior depth, measuring 6-7 mm. There was no  evidence of right axillary adenopathy.  Ultrasound revealed no suspicious masses or shadowing lesions.  Bone density on 06/20/2010 revealed osteopenia with a T score of -2.0 in the forearm radius.  Bone density on 04/09/2019 revealed osteoporosis with a T-score of -2.5 in the forearm radius.   Symptomatically, she denies any complaints.  Exam reveals right breast post-biopsy changes.  Plan: 1.   Labs today: CBC w/ diff, CMP, CA27.29. 2.   Right breast cancer  Discuss diagnosis, staging, and management of breast cancer.  Current imaging suggests an early stage (stage I) breast cancer.  Discuss additional imaging (breast MRI) given lobular features.  Discuss awaiting ER/PR, and Her2/neu testing.   If ER/PR+, anticipate adjuvant tamoxifen or AI.  Discuss surgical consultation.  Discuss consideration of genetics consult.  Patient in agreement.  Discuss plan for Oncotype DX testing on surgical specimen. 3.   Osteoporosis  Discuss calcium and vitamin D.  Discuss  Bisphosphonate or RANK-ligand (Prolia).  Discuss potential side effects and need for dental clearance. 4.   Schedule breast MRI. 5.   RTC after breast MRI for MD assessment.  I discussed the assessment and treatment plan with the patient.  The patient was provided an opportunity to ask questions and all were answered.  The patient agreed with the plan and demonstrated an understanding of the instructions.  The patient was advised to call back if the symptoms worsen or if the condition fails to improve as anticipated.   Annemarie Sebree C. Mike Gip, MD, PhD    05/01/2019, 10:45 AM  I, Molly Dorshimer, am acting as Education administrator for [employer]. Mike Gip, MD, PhD.  I, Dequane Strahan C. Mike Gip, MD, have reviewed the above documentation for accuracy and completeness, and I agree with the above.

## 2019-05-01 ENCOUNTER — Encounter: Payer: Self-pay | Admitting: Hematology and Oncology

## 2019-05-01 ENCOUNTER — Inpatient Hospital Stay: Payer: Medicare Other | Attending: Hematology and Oncology | Admitting: Hematology and Oncology

## 2019-05-01 ENCOUNTER — Ambulatory Visit: Payer: Federal, State, Local not specified - PPO | Admitting: Surgery

## 2019-05-01 ENCOUNTER — Encounter: Payer: Self-pay | Admitting: Surgery

## 2019-05-01 ENCOUNTER — Other Ambulatory Visit: Payer: Self-pay

## 2019-05-01 VITALS — BP 145/76 | HR 75 | Temp 97.2°F | Resp 18 | Ht 61.0 in | Wt 172.2 lb

## 2019-05-01 VITALS — BP 142/83 | HR 93 | Temp 97.7°F | Ht 61.0 in | Wt 172.4 lb

## 2019-05-01 DIAGNOSIS — M810 Age-related osteoporosis without current pathological fracture: Secondary | ICD-10-CM | POA: Diagnosis not present

## 2019-05-01 DIAGNOSIS — F17210 Nicotine dependence, cigarettes, uncomplicated: Secondary | ICD-10-CM | POA: Diagnosis not present

## 2019-05-01 DIAGNOSIS — C50411 Malignant neoplasm of upper-outer quadrant of right female breast: Secondary | ICD-10-CM | POA: Diagnosis not present

## 2019-05-01 DIAGNOSIS — Z170 Estrogen receptor positive status [ER+]: Secondary | ICD-10-CM | POA: Diagnosis not present

## 2019-05-01 DIAGNOSIS — Z79899 Other long term (current) drug therapy: Secondary | ICD-10-CM | POA: Diagnosis not present

## 2019-05-01 DIAGNOSIS — Z96641 Presence of right artificial hip joint: Secondary | ICD-10-CM | POA: Diagnosis not present

## 2019-05-01 LAB — COMPREHENSIVE METABOLIC PANEL
ALT: 31 U/L (ref 0–44)
AST: 25 U/L (ref 15–41)
Albumin: 4.8 g/dL (ref 3.5–5.0)
Alkaline Phosphatase: 86 U/L (ref 38–126)
Anion gap: 11 (ref 5–15)
BUN: 15 mg/dL (ref 8–23)
CO2: 28 mmol/L (ref 22–32)
Calcium: 9.9 mg/dL (ref 8.9–10.3)
Chloride: 102 mmol/L (ref 98–111)
Creatinine, Ser: 0.66 mg/dL (ref 0.44–1.00)
GFR calc Af Amer: >60 mL/min (ref >60)
GFR calc non Af Amer: >60 mL/min (ref >60)
Glucose, Bld: 120 mg/dL — ABNORMAL HIGH (ref 70–99)
Potassium: 4.2 mmol/L (ref 3.5–5.1)
Sodium: 141 mmol/L (ref 135–145)
Total Bilirubin: 1 mg/dL (ref 0.3–1.2)
Total Protein: 8.1 g/dL (ref 6.5–8.1)

## 2019-05-01 LAB — CBC WITH DIFFERENTIAL/PLATELET
Abs Immature Granulocytes: 0.04 10*3/uL (ref 0.00–0.07)
Basophils Absolute: 0 10*3/uL (ref 0.0–0.1)
Basophils Relative: 0 %
Eosinophils Absolute: 0 10*3/uL (ref 0.0–0.5)
Eosinophils Relative: 0 %
HCT: 44.4 % (ref 36.0–46.0)
Hemoglobin: 14.7 g/dL (ref 12.0–15.0)
Immature Granulocytes: 1 %
Lymphocytes Relative: 26 %
Lymphs Abs: 2.2 10*3/uL (ref 0.7–4.0)
MCH: 31.4 pg (ref 26.0–34.0)
MCHC: 33.1 g/dL (ref 30.0–36.0)
MCV: 94.9 fL (ref 80.0–100.0)
Monocytes Absolute: 0.4 10*3/uL (ref 0.1–1.0)
Monocytes Relative: 5 %
Neutro Abs: 6 10*3/uL (ref 1.7–7.7)
Neutrophils Relative %: 68 %
Platelets: 281 10*3/uL (ref 150–400)
RBC: 4.68 MIL/uL (ref 3.87–5.11)
RDW: 13.3 % (ref 11.5–15.5)
WBC: 8.7 10*3/uL (ref 4.0–10.5)
nRBC: 0 % (ref 0.0–0.2)

## 2019-05-01 NOTE — Progress Notes (Signed)
[facility]  [address] [place], [place] [address] Phone: [phone]  Fax: [phone]   Clinic Day:  05/05/2019  Referring physician: Birdie Sons, MD  Chief Complaint: Elizabeth [name] is a 68 y.o. female with clinical stage IA right breast cancer who is seen for review of interval breast MRI and discussion regarding direction of therapy.  HPI: The patient was last seen in the medical oncology clinic on 05/01/2019 for initial consultation.  She denied any complaints. Exam revealed right breast post-biopsy changes. Labs were normal.  We discussed treatment for apparent clinical stage I breast cancer.  Breast MRI was ordered given the lobular features in the biopsy specimen.  Biopsy subsequently returned ER + (> 90%), PR- (> 90%), and Her1/neu -.  The patient was seen for initial consultation with general surgeon, Dr. Caroleen Hamman, on 05/01/2019.  Dr. Dahlia Byes discussed a lumpectomy.  Follow up was scheduled on 05/06/2019.  Bilateral breast MRI on 05/04/2019 revealed a 1.1 cm right breast malignancy in the lateral midportion of the right breast.  There were associated biopsy change.  The left breast was negative.  There was no evidence for adenopathy.  Clinical stage was T1cN0.  During the interim, the patient is feeling "not to bad". She reports that Dr. Dahlia Byes called this morning.  She is scheduled for surgery on Friday (05/08/2019).   She notes for the last 2 days she was prepared to have a mastectomy based on what her daughter experienced. Her daughter had her thinking the worst about surgeries and treatment. Once she discussed therapy and the lumpectomy she is relieved.  She is interested in genetic testing.    Past Medical History:  Diagnosis Date  . Constipation due to opioid therapy   . Degenerative joint disease (DJD) of hip   . GERD (gastroesophageal reflux disease)   . Osteopenia   . Restless leg syndrome    takes Mirapex and  xanax  . Scoliosis   . SUI (stress urinary incontinence, female)   . Vaso-vagal reaction    after back surgery    Past Surgical History:  Procedure Laterality Date  . APPENDECTOMY  1963  . BACK SURGERY    . BREAST BIOPSY Right 04/27/2019   Affirm Bx "X" clip-path pending  . BREAST CYST ASPIRATION Right 1988  . BREAST EXCISIONAL BIOPSY Right 1990   benign x 3  . BREAST SURGERY    . CATARACT EXTRACTION W/PHACO Right 03/31/2018   Procedure: CATARACT EXTRACTION PHACO AND INTRAOCULAR LENS PLACEMENT ([place]) RIGHT;  Surgeon: Leandrew Koyanagi, MD;  Location: [facility];  Service: Ophthalmology;  Laterality: Right;  Latex sensitivity  . CATARACT EXTRACTION W/PHACO Left 04/16/2018   Procedure: CATARACT EXTRACTION PHACO AND INTRAOCULAR LENS PLACEMENT ([place])  LEFT;  Surgeon: Leandrew Koyanagi, MD;  Location: [facility];  Service: Ophthalmology;  Laterality: Left;  . EYE SURGERY     Lasik  . FOOT SURGERY Bilateral   . HYSTEROTOMY    . LAMINECTOMY  2006  . ROTATOR CUFF REPAIR Bilateral   . TONSILLECTOMY     age 19  . TOTAL HIP ARTHROPLASTY Right 01/18/2015   Procedure: RIGHT TOTAL HIP ARTHROPLASTY ANTERIOR APPROACH;  Surgeon: Renette Butters, MD;  Location: [facility];  Service: Orthopedics;  Laterality: Right;  . TUBAL LIGATION      Family History  Problem Relation Age of Onset  . Alcohol abuse Mother   . Cancer Mother 65       lung  .  Migraines Mother   . Cancer Father 69       prostate  . Diabetes Father   . Alcohol abuse Brother   . Cancer Brother        skin  . Cancer Daughter        breast  . Breast cancer Daughter 39  . Bladder Cancer Neg Hx   . Kidney cancer Neg Hx     Social History:  reports that she quit smoking about 7 years ago. Her smoking use included cigarettes. She has a 3.00 pack-year smoking history. She has never used smokeless tobacco. She reports current alcohol use of about 5.0 standard drinks of alcohol per week. She reports that she does  not use drugs. She denies any known exposure to radiation or toxins. She is originally from [place], but lived in [place], [place] for many years and has been in [place] since 1999 after she got promoted working for [employer]. She is currently retired. She has several grandchildren and 10 great grandchildren. She lives in [place]. The patient is alone today.  Allergies:  Allergies  Allergen Reactions  . Zostavax [Zoster Vaccine Live] Rash  . Naproxen Hives  . Penicillins Hives  . Zoloft [Sertraline Hcl] Hives  . Latex Itching and Rash    Condoms    Current Medications: Current Outpatient Medications  Medication Sig Dispense Refill  . ALPRAZolam (XANAX) 0.5 MG tablet TAKE 3 TABLETS BY MOUTH EVERY NIGHT AT BEDTIME 90 tablet 5  . Multiple Vitamins-Minerals (MULTIVITAMIN WITH MINERALS) tablet Take 1 tablet by mouth 2 (two) times daily.     . pramipexole (MIRAPEX) 1 MG tablet TAKE 1 TABLET(1 MG) BY MOUTH AT BEDTIME 30 tablet 12  . nitroGLYCERIN (NITROSTAT) 0.4 MG SL tablet Place 1 tablet (0.4 mg total) under the tongue every 5 (five) minutes as needed for chest pain. Go to ER if third tablet is necessary (Patient not taking: Reported on 05/05/2019) 30 tablet 5  . omeprazole (PRILOSEC) 20 MG capsule TAKE 1 CAPSULE BY MOUTH ONCE DAILY (Patient not taking: Reported on 05/05/2019) 90 capsule 2  . valACYclovir (VALTREX) 500 MG tablet Take 1 tablet (500 mg total) by mouth daily. Reported on 10/24/2015 (Patient not taking: Reported on 05/05/2019) 30 tablet 11   No current facility-administered medications for this visit.     Review of Systems  Constitutional: Negative.  Negative for chills, diaphoresis, fever, malaise/fatigue and weight loss (up 2 lbs).       "Not to bad".  HENT: Negative.  Negative for congestion, hearing loss, sinus pain and sore throat.   Eyes: Negative.  Negative for blurred vision.  Respiratory: Negative.  Negative for cough, shortness of breath and wheezing.    Cardiovascular: Negative.  Negative for chest pain, palpitations, orthopnea, leg swelling and PND.  Gastrointestinal: Negative.  Negative for abdominal pain, blood in stool, constipation, diarrhea, melena, nausea and vomiting.  Genitourinary: Negative.  Negative for dysuria, frequency, hematuria and urgency.  Musculoskeletal: Negative.  Negative for back pain, joint pain and myalgias.       Slight soreness in right breast biopsy site.  Skin: Negative.  Negative for rash.  Neurological: Negative.  Negative for dizziness, tingling, sensory change, weakness and headaches.  Endo/Heme/Allergies: Negative.  Does not bruise/bleed easily.  Psychiatric/Behavioral: Negative.  Negative for depression, memory loss and substance abuse. The patient is not nervous/anxious and does not have insomnia.   All other systems reviewed and are negative.  Performance status (ECOG): 0  Vitals Blood pressure Marland Kitchen)  132/56, pulse 74, temperature 98 F (36.7 C), temperature source Tympanic, resp. rate 18, height '5\' 1"'$  (1.549 m), weight 174 lb 6.1 oz (79.1 kg), SpO2 100 %.    Physical Exam  Constitutional: She is oriented to person, place, and time. She appears well-developed and well-nourished. No distress.  HENT:  Head: Normocephalic and atraumatic.  Long brown hair braided. Mask.  Eyes: Conjunctivae and EOM are normal. No scleral icterus.  Brown eyes.   Neurological: She is alert and oriented to person, place, and time.  Skin: She is not diaphoretic.  Psychiatric: She has a normal mood and affect. Her behavior is normal. Judgment and thought content normal.  Nursing note and vitals reviewed.   No visits with results within 3 Day(s) from this visit.  Latest known visit with results is:  Office Visit on 05/01/2019  Component Date Value Ref Range Status  . WBC 05/01/2019 8.7  4.0 - 10.5 K/uL Final  . RBC 05/01/2019 4.68  3.87 - 5.11 MIL/uL Final  . Hemoglobin 05/01/2019 14.7  12.0 - 15.0 g/dL Final  . HCT  05/01/2019 44.4  36.0 - 46.0 % Final  . MCV 05/01/2019 94.9  80.0 - 100.0 fL Final  . MCH 05/01/2019 31.4  26.0 - 34.0 pg Final  . MCHC 05/01/2019 33.1  30.0 - 36.0 g/dL Final  . RDW 05/01/2019 13.3  11.5 - 15.5 % Final  . Platelets 05/01/2019 281  150 - 400 K/uL Final  . nRBC 05/01/2019 0.0  0.0 - 0.2 % Final  . Neutrophils Relative % 05/01/2019 68  % Final  . Neutro Abs 05/01/2019 6.0  1.7 - 7.7 K/uL Final  . Lymphocytes Relative 05/01/2019 26  % Final  . Lymphs Abs 05/01/2019 2.2  0.7 - 4.0 K/uL Final  . Monocytes Relative 05/01/2019 5  % Final  . Monocytes Absolute 05/01/2019 0.4  0.1 - 1.0 K/uL Final  . Eosinophils Relative 05/01/2019 0  % Final  . Eosinophils Absolute 05/01/2019 0.0  0.0 - 0.5 K/uL Final  . Basophils Relative 05/01/2019 0  % Final  . Basophils Absolute 05/01/2019 0.0  0.0 - 0.1 K/uL Final  . Immature Granulocytes 05/01/2019 1  % Final  . Abs Immature Granulocytes 05/01/2019 0.04  0.00 - 0.07 K/uL Final   Performed at [facility], [address] [place], [place] [address]  . Sodium 05/01/2019 141  135 - 145 mmol/L Final  . Potassium 05/01/2019 4.2  3.5 - 5.1 mmol/L Final  . Chloride 05/01/2019 102  98 - 111 mmol/L Final  . CO2 05/01/2019 28  22 - 32 mmol/L Final  . Glucose, Bld 05/01/2019 120* 70 - 99 mg/dL Final  . BUN 05/01/2019 15  8 - 23 mg/dL Final  . Creatinine, Ser 05/01/2019 0.66  0.44 - 1.00 mg/dL Final  . Calcium 05/01/2019 9.9  8.9 - 10.3 mg/dL Final  . Total Protein 05/01/2019 8.1  6.5 - 8.1 g/dL Final  . Albumin 05/01/2019 4.8  3.5 - 5.0 g/dL Final  . AST 05/01/2019 25  15 - 41 U/L Final  . ALT 05/01/2019 31  0 - 44 U/L Final  . Alkaline Phosphatase 05/01/2019 86  38 - 126 U/L Final  . Total Bilirubin 05/01/2019 1.0  0.3 - 1.2 mg/dL Final  . GFR calc non Af Amer 05/01/2019 >60  >60 mL/min Final  . GFR calc Af Amer 05/01/2019 >60  >60 mL/min Final  . Anion gap 05/01/2019 11  5 - 15 Final  Performed at [facility], [address] [place], [place] [address]  . CA 27.29 05/01/2019 20.0  0.0 - 38.6 U/mL Final   Comment: (NOTE) Siemens Centaur Immunochemiluminometric Methodology [facility]) Values obtained with different assay methods or kits cannot be used interchangeably. Results cannot be interpreted as absolute evidence of the presence or absence of malignant disease. Performed At: [facility] [place], [place] [id number] Rush Farmer MD HQ:[id number]     Assessment:  Elizabeth [name] is a 68 y.o. female with clinical stage IA right breast cancer s/p ultrasound guided biopsy on 04/27/2019.  Pathology revealed a 7 mm grade II invasive mammary carcinoma with ductal and lobular features.  DCIS was present.  Tumor was ER + (> 90%), PR- (> 90%), and Her1/neu -.   CA27.29 was 20.0 on 05/01/2019.  Right diagnostic mammogram on 04/22/2019 showed persistent architectural distortion in the right breast upper outer quadrant, posterior depth, which represents postsurgical scarring (scar marker). There was a separate asymmetry in the upper right breast, posterior depth, measuring 6-7 mm. There was no evidence of right axillary adenopathy.  Ultrasound revealed no suspicious masses or shadowing lesions.  Bilateral breast MRI on 05/04/2019 revealed a 1.1 cm right breast malignancy in the lateral midportion of the right breast.  There were associated biopsy change.  The left breast was negative.  There was no evidence for adenopathy.  Clinical stage was T1cN0.  Bone density on 06/20/2010 revealed osteopenia with a T score of -2.0 in the forearm radius.  Bone density on 04/09/2019 revealed osteoporosis with a T-score of -2.5 in the forearm radius.  Symptomatically, she is doing well.  Lumpectomy is scheduled for 05/08/2019.  Plan: 1.   Clinical stage IA right breast cancer  Discuss tumor is ER +, PR +, and Her2/neu-.                         Discuss plan for adjuvant tamoxifen or AI  after radiation.             Discuss interval breast MRI.   Lesion is 1.1 cm.  Clinical stage is IA.   No abnormality in left breast.  Discuss plan for lumpectomy on 05/08/2019.   Send tumor for Oncotype DX.             Discuss plan for radiation after recovery from surgery.   Consult Dr Baruch Gouty.  Patient interested in genetics consult. 2.   Osteoporosis             Review calcium and vitamin D supplementation.             Re-review bisphosphonate or RANK-ligand (Prolia).  Discuss dental clearance after recovery from surgery or during radiation. 3.   [facility] genetic testing. 4.   Radiation oncology consult. 5.   RTC 2 weeks after surgery (patient to call).  I discussed the assessment and treatment plan with the patient.  The patient was provided an opportunity to ask questions and all were answered.  The patient agreed with the plan and demonstrated an understanding of the instructions.  The patient was advised to call back if the symptoms worsen or if the condition fails to improve as anticipated.  I provided 17 minutes of face-to-face time during this this encounter and > 50% was spent counseling as documented under my assessment and plan.    Lequita Asal, MD, PhD    05/05/2019, 11:00 AM  I, Selena Batten,  am acting as Education administrator for [employer] C. Mike Gip, MD, PhD.  I, Melissa C. Mike Gip, MD, have reviewed the above documentation for accuracy and completeness, and I agree with the above.

## 2019-05-01 NOTE — Patient Instructions (Addendum)
Please see your follow up appointment listed below.    Lumpectomy  A lumpectomy, sometimes called a partial mastectomy, is surgery to remove a cancerous tumor or mass (the lump) from a breast. It is a form of "breast conserving" or "breast preservation" surgery. This means that the cancerous tissue is removed but the breast remains intact. During a lumpectomy, the portion of the breast that contains the tumor is removed. Some normal tissue around the lump may be taken out to make sure that all of the tumor has been removed. Lymph nodes under your arm may also be removed and tested to find out if the cancer has spread. Lymph nodes are part of the body's disease-fighting system (immune system) and are usually the first place where breast cancer spreads. Tell a health care provider about:  Any allergies you have.  All medicines you are taking, including vitamins, herbs, eye drops, creams, and over-the-counter medicines.  Any problems you or family members have had with anesthetic medicines.  Any blood disorders you have.  Any surgeries you have had.  Any medical conditions you have.  Whether you are pregnant or may be pregnant. What are the risks? Generally, this is a safe procedure. However, problems may occur, including:  Bleeding.  Infection.  Allergic reaction to medicines.  Pain, swelling, weakness, or numbness in the arm on the side of your surgery.  Temporary swelling.  Change in the shape of the breast, particularly if a large portion is removed.  Scar tissue that forms at the surgical site and feels hard to the touch. What happens before the procedure? Staying hydrated Follow instructions from your health care provider about hydration, which may include:  Up to 2 hours before the procedure - you may continue to drink clear liquids, such as water, clear fruit juice, black coffee, and plain tea.  Eating and drinking restrictions  Follow instructions from your health  care provider about eating and drinking, which may include: ? 8 hours before the procedure - stop eating heavy meals or foods such as meat, fried foods, or fatty foods. ? 6 hours before the procedure - stop eating light meals or foods, such as toast or cereal. ? 6 hours before the procedure - stop drinking milk or drinks that contain milk. ? 2 hours before the procedure - stop drinking clear liquids. Medicines  Ask your health care provider about: ? Changing or stopping your regular medicines. This is especially important if you are taking diabetes medicines or blood thinners. ? Taking medicines such as aspirin and ibuprofen. These medicines can thin your blood. Do not take these medicines before your procedure if your health care provider instructs you not to.  You may be given antibiotic medicine to help prevent infection. General instructions  Prior to surgery, your health care provider may do a procedure to locate and mark the tumor area in your breast (localization). This procedure will help guide your surgeon to where the incision will be made. This may be done with: ? Imaging. This may include a mammogram, ultrasound, or MRI. ? Insertion of a small wire, clip, seed, or radar reflector implant.  You may be screened for extra fluid around the lymph nodes (lymphedema).  Ask your health care provider how your surgical site will be marked or identified.  Plan to have someone take you home from the hospital or clinic. What happens during the procedure?   To lower your risk of infection: ? Your health care team will wash or  sanitize their hands. ? Your skin will be washed with soap.  An IV will be inserted into one of your veins.  You will be given one or more of the following: ? A medicine to help you relax (sedative). ? A medicine to numb the area (local anesthetic). ? A medicine to make you fall asleep (general anesthetic).  Your health care provider will use a kind of  electric scalpel that uses heat to minimize bleeding (electrocautery knife). A curved incision that follows the natural curve of your breast will be made. This type of incision will allow for minimal scarring and better healing.  The tumor will be removed along with some of the surrounding tissue. This will be sent to the lab for testing. Your health care provider may also remove lymph nodes at this time if needed.  If the tumor is close to the muscles over your chest, some muscle tissue may also be removed.  A small drain tube may be inserted into your breast area or armpit to collect fluid that may build up after surgery. This tube will be connected to a suction bulb on the outside of your body to remove the fluid.  The incision will be closed with stitches (sutures).  A bandage (dressing) may be placed over the incision. The procedure may vary among health care providers and hospitals. What happens after the procedure?  Your blood pressure, heart rate, breathing rate, and blood oxygen level will be monitored until you leave the hospital or clinic.  You will be given medicine for pain as needed.  Your IV will be removed when you are able to eat and drink by mouth.  You will be encouraged to get up and walk as soon as you can. This is important to improve blood flow and breathing. Ask for help if you feel weak or unsteady.  You may have a drain tube in place for 2-3 days to prevent a collection of blood (hematoma) from developing in the breast. You will be given instructions about caring for the drain before you go home.  A pressure bandage may be applied for 1-2 days to prevent bleeding or swelling. Your pressure bandage may look like a thick piece of fabric or an elastic wrap. Ask your health care provider how to care for your bandage at home.  You may be given a tight sleeve to wear over your arm on the side of your surgery. You should wear this sleeve as told by your health care  provider.  Do not drive for 24 hours if you were given a sedative during your procedure. Summary  A lumpectomy, sometimes called a partial mastectomy, is surgery to remove a cancerous tumor or mass (the lump) from a breast.  During a lumpectomy, the portion of the breast that contains the tumor is removed. Some normal tissue around the lump may be taken out to make sure that all of the tumor has been removed. Lymph nodes under your arm may also be removed and tested to find out if the cancer has spread.  You may have a drain tube in place for 2-3 days to prevent a collection of blood (hematoma) from developing in the breast. You will be given instructions about caring for the drain before you go home.  Plan to have someone take you home from the hospital or clinic. This information is not intended to replace advice given to you by your health care provider. Make sure you discuss any questions you  have with your health care provider. Document Released: 11/05/2006 Document Revised: 03/25/2018 Document Reviewed: 06/06/2016 Elsevier Patient Education  2020 [employer].

## 2019-05-02 LAB — CANCER ANTIGEN 27.29: CA 27.29: 20 U/mL (ref 0.0–38.6)

## 2019-05-04 ENCOUNTER — Other Ambulatory Visit: Payer: Self-pay

## 2019-05-04 ENCOUNTER — Ambulatory Visit
Admission: RE | Admit: 2019-05-04 | Discharge: 2019-05-04 | Disposition: A | Discharge status: Home / Self Care | Payer: Federal, State, Local not specified - PPO | Source: Ambulatory Visit | Attending: Hematology and Oncology | Admitting: Hematology and Oncology

## 2019-05-04 DIAGNOSIS — C50411 Malignant neoplasm of upper-outer quadrant of right female breast: Secondary | ICD-10-CM

## 2019-05-04 DIAGNOSIS — M816 Localized osteoporosis [Lequesne]: Secondary | ICD-10-CM | POA: Insufficient documentation

## 2019-05-04 LAB — SURGICAL PATHOLOGY

## 2019-05-04 MED ORDER — GADOBUTROL 1 MMOL/ML IV SOLN
7.0000 mL | Freq: Once | INTRAVENOUS | Status: AC | PRN
  Administered 2019-05-04: 7 mL via INTRAVENOUS

## 2019-05-04 NOTE — Progress Notes (Signed)
Patient ID: Elizabeth [name], female   DOB: 10-Nov-1950, 68 y.o.   MRN: [id number]  HPI Elizabeth [name] is a 68 y.o. female consultation by Dr. Caryn Section for newly diagnosed breast cancer.  Reports that this was on a routine yearly mammogram.  The last time she had mammogram was May 2019.  She denies pain breast lumps, any changes in the nipple or skin.  No breast pain or nipple discharge.  She did have a prior history of right breast biopsy in the 1990s in [place].  There is no records. 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  ER PR and HER-2 are pending at this time She endorses hormonal therapy for 10 years or so and her menarche was at 68 years old.  Menopause late 72s.  She did have hysterectomy.. Family history significant for a daughter with breast cancer at age 59.  Genetic testing is unknown.  Mother with lung cancer.  Father with prostate cancer. Is able to perform more than 4 METS of activity without any shortness of breath or chest pain  HPI  Past Medical History:  Diagnosis Date  . Constipation due to opioid therapy   . Degenerative joint disease (DJD) of hip   . GERD (gastroesophageal reflux disease)   . Osteopenia   . Restless leg syndrome    takes Mirapex and xanax  . Scoliosis   . SUI (stress urinary incontinence, female)   . Vaso-vagal reaction    after back surgery    Past Surgical History:  Procedure Laterality Date  . APPENDECTOMY  1963  . BACK SURGERY    . BREAST BIOPSY Right 04/27/2019   Affirm Bx "X" clip-path pending  . BREAST CYST ASPIRATION Right 1988  . BREAST EXCISIONAL BIOPSY Right 1990   benign x 3  . BREAST SURGERY    . CATARACT EXTRACTION W/PHACO Right 03/31/2018   Procedure: CATARACT EXTRACTION PHACO AND INTRAOCULAR LENS PLACEMENT ([place]) RIGHT;  Surgeon: Leandrew Koyanagi, MD;   Location: [facility];  Service: Ophthalmology;  Laterality: Right;  Latex sensitivity  . CATARACT EXTRACTION W/PHACO Left 04/16/2018   Procedure: CATARACT EXTRACTION PHACO AND INTRAOCULAR LENS PLACEMENT ([place])  LEFT;  Surgeon: Leandrew Koyanagi, MD;  Location: [facility];  Service: Ophthalmology;  Laterality: Left;  . EYE SURGERY     Lasik  . FOOT SURGERY Bilateral   . HYSTEROTOMY    . LAMINECTOMY  2006  . ROTATOR CUFF REPAIR Bilateral   . TONSILLECTOMY     age 40  . TOTAL HIP ARTHROPLASTY Right 01/18/2015   Procedure: RIGHT TOTAL HIP ARTHROPLASTY ANTERIOR APPROACH;  Surgeon: Renette Butters, MD;  Location: [facility];  Service: Orthopedics;  Laterality: Right;  . TUBAL LIGATION      Family History  Problem Relation Age of Onset  . Alcohol abuse Mother   . Cancer Mother 4       lung  . Migraines Mother   . Cancer Father 34       prostate  . Diabetes Father   . Alcohol abuse Brother   . Cancer Brother        skin  . Cancer Daughter        breast  . Breast cancer Daughter 82  . Bladder Cancer Neg Hx   . Kidney cancer Neg Hx     Social History Social History   Tobacco  Use  . Smoking status: Former Smoker    Packs/day: 0.50    Years: 6.00    Pack years: 3.00    Types: Cigarettes    Quit date: 01/07/2012    Years since quitting: 7.3  . Smokeless tobacco: Never Used  . Tobacco comment: vapors occasionally  Substance Use Topics  . Alcohol use: Yes    Alcohol/week: 5.0 standard drinks    Types: 5 Glasses of wine per week    Comment: red wine  . Drug use: No    Allergies  Allergen Reactions  . Zostavax [Zoster Vaccine Live] Rash  . Naproxen Hives  . Penicillins Hives  . Zoloft [Sertraline Hcl] Hives  . Latex Itching and Rash    Condoms    Current Outpatient Medications  Medication Sig Dispense Refill  . ALPRAZolam (XANAX) 0.5 MG tablet TAKE 3 TABLETS BY MOUTH EVERY NIGHT AT BEDTIME 90 tablet 5  . Multiple Vitamins-Minerals (MULTIVITAMIN WITH  MINERALS) tablet Take 1 tablet by mouth 2 (two) times daily.     . nitroGLYCERIN (NITROSTAT) 0.4 MG SL tablet Place 1 tablet (0.4 mg total) under the tongue every 5 (five) minutes as needed for chest pain. Go to ER if third tablet is necessary 30 tablet 5  . omeprazole (PRILOSEC) 20 MG capsule TAKE 1 CAPSULE BY MOUTH ONCE DAILY 90 capsule 2  . pramipexole (MIRAPEX) 1 MG tablet TAKE 1 TABLET(1 MG) BY MOUTH AT BEDTIME 30 tablet 12  . valACYclovir (VALTREX) 500 MG tablet Take 1 tablet (500 mg total) by mouth daily. Reported on 10/24/2015 30 tablet 11   No current facility-administered medications for this visit.      Review of Systems Full ROS  was asked and was negative except for the information on the HPI  Physical Exam Blood pressure (!) 142/83, pulse 93, temperature 97.7 F (36.5 C), height '5\' 1"'$  (1.549 m), weight 172 lb 6.4 oz (78.2 kg), SpO2 95 %. CONSTITUTIONAL: NAD EYES: Pupils are equal, round, and reactive to light, Sclera are non-icteric. EARS, NOSE, MOUTH AND THROAT: The oropharynx is clear. The oral mucosa is pink and moist. Hearing is intact to voice. LYMPH NODES:  Lymph nodes in the neck are normal. RESPIRATORY:  Lungs are clear. There is normal respiratory effort, with equal breath sounds bilaterally, and without pathologic use of accessory muscles. CARDIOVASCULAR: Heart is regular without murmurs, gallops, or rubs. BREAST: recent biopsy changes on the right side, no infection, normal nipple and skin. No masses, No skin changes no LAD. GI: The abdomen is  soft, nontender, and nondistended. There are no palpable masses. There is no hepatosplenomegaly. There are normal bowel sounds in all quadrants. GU: Rectal deferred.   MUSCULOSKELETAL: Normal muscle strength and tone. No cyanosis or edema.   SKIN: Turgor is good and there are no pathologic skin lesions or ulcers. NEUROLOGIC: Motor and sensation is grossly normal. Cranial nerves are grossly intact. PSYCH:  Oriented to person,  place and time. Affect is normal.  Data Reviewed  I have personally reviewed the patient's imaging, laboratory findings and medical records.    Assessment/Plan 68 year old female with recently diagnosed breast cancer on the right side.  This is nonpalpable,  markers are still pending.  Is definitely early stage.  I had a lengthy discussion with the patient regarding surgical management of breast cancer to include lumpectomy and sentinel lymph node biopsy plus radiation versus mastectomy with sentinel lymph node biopsies.  Currently we are still awaiting testing and she does have a  pending MRI to figure out the next steps in therapy.  I will see her back next week.  She recently had an appointment with oncology and we will follow along.  The interim she will think about surgical options and hopefully next week we will have more concrete data. A copy of this report was sent to the referring provider   Caroleen Hamman, MD FACS General Surgeon 05/04/2019, 5:49 PM

## 2019-05-05 ENCOUNTER — Telehealth: Payer: Self-pay | Admitting: *Deleted

## 2019-05-05 ENCOUNTER — Encounter: Payer: Self-pay | Admitting: Hematology and Oncology

## 2019-05-05 ENCOUNTER — Other Ambulatory Visit
Admission: RE | Admit: 2019-05-05 | Discharge: 2019-05-05 | Disposition: A | Discharge status: Home / Self Care | Payer: Federal, State, Local not specified - PPO | Source: Ambulatory Visit | Attending: Surgery | Admitting: Surgery

## 2019-05-05 ENCOUNTER — Inpatient Hospital Stay (HOSPITAL_BASED_OUTPATIENT_CLINIC_OR_DEPARTMENT_OTHER): Payer: Medicare Other | Admitting: Hematology and Oncology

## 2019-05-05 ENCOUNTER — Other Ambulatory Visit: Payer: Self-pay | Admitting: Surgery

## 2019-05-05 VITALS — BP 132/56 | HR 74 | Temp 98.0°F | Resp 18 | Ht 61.0 in | Wt 174.4 lb

## 2019-05-05 DIAGNOSIS — Z170 Estrogen receptor positive status [ER+]: Secondary | ICD-10-CM

## 2019-05-05 DIAGNOSIS — Z20828 Contact with and (suspected) exposure to other viral communicable diseases: Secondary | ICD-10-CM | POA: Diagnosis not present

## 2019-05-05 DIAGNOSIS — Z79899 Other long term (current) drug therapy: Secondary | ICD-10-CM

## 2019-05-05 DIAGNOSIS — F17210 Nicotine dependence, cigarettes, uncomplicated: Secondary | ICD-10-CM

## 2019-05-05 DIAGNOSIS — M810 Age-related osteoporosis without current pathological fracture: Secondary | ICD-10-CM | POA: Diagnosis not present

## 2019-05-05 DIAGNOSIS — C50411 Malignant neoplasm of upper-outer quadrant of right female breast: Secondary | ICD-10-CM

## 2019-05-05 DIAGNOSIS — M816 Localized osteoporosis [Lequesne]: Secondary | ICD-10-CM

## 2019-05-05 NOTE — Telephone Encounter (Signed)
Message left on home and cell numbers for patient to call the office.   Patient will need to keep appointment with Dr. Dahlia Byes scheduled for tomorrow, 05-06-19.   Further surgery instructions can be reviewed at that time.

## 2019-05-05 NOTE — Telephone Encounter (Signed)
Patient called the office and stated that Dr. Dahlia Byes wanted to get her scheduled for surgery on Friday. She was wanting to know when she would need to have COVID testing.   The patient was also scheduled for an appointment with Dr. Dahlia Byes for tomorrow but she is unsure if she needs to keep appointment.   Patient aware she needs to go today for COVID testing at the [facility]. Caryl Pina in Pre-admission is aware that the patient will be coming.   I told patient I would get in touch with Dr. Dahlia Byes about her appointment for tomorrow and call her back.

## 2019-05-05 NOTE — Progress Notes (Signed)
No new changes noted today 

## 2019-05-05 NOTE — Telephone Encounter (Signed)
I did call patient and was able to reach her at her home number.   Patient aware to keep appointment for 05-06-19 as scheduled with Dr. Dahlia Byes.   The patient is aware I am still working on getting surgery scheduled with [facility]. Further instructions will be reviewed at her appointment tomorrow.   Patient verbalizes understanding.

## 2019-05-05 NOTE — Addendum Note (Signed)
Addended by: Caroleen Hamman F on: 05/05/2019 11:44 AM   Modules accepted: Orders, SmartSet

## 2019-05-06 ENCOUNTER — Inpatient Hospital Stay
Admission: RE | Admit: 2019-05-06 | Discharge: 2019-05-06 | Disposition: A | Discharge status: Home / Self Care | Payer: Federal, State, Local not specified - PPO | Source: Ambulatory Visit

## 2019-05-06 ENCOUNTER — Other Ambulatory Visit: Payer: Self-pay

## 2019-05-06 ENCOUNTER — Ambulatory Visit: Payer: Federal, State, Local not specified - PPO | Admitting: Surgery

## 2019-05-06 ENCOUNTER — Encounter: Payer: Self-pay | Admitting: Surgery

## 2019-05-06 VITALS — BP 136/74 | HR 66 | Temp 97.7°F | Ht 61.0 in | Wt 174.2 lb

## 2019-05-06 DIAGNOSIS — C50411 Malignant neoplasm of upper-outer quadrant of right female breast: Secondary | ICD-10-CM

## 2019-05-06 HISTORY — DX: Angina pectoris, unspecified: I20.9

## 2019-05-06 HISTORY — DX: Anxiety disorder, unspecified: F41.9

## 2019-05-06 LAB — SARS CORONAVIRUS 2 (TAT 6-24 HRS): SARS Coronavirus 2: NEGATIVE

## 2019-05-06 NOTE — H&P (View-Only) (Signed)
Outpatient Surgical Follow Up  05/06/2019  Elizabeth [name] is an 68 y.o. female.   Chief Complaint  Patient presents with  . Follow-up    Right breast cancer    HPI: Elizabeth [name] is following up after recently diagnosed right breast cancer.  She had an appointment with Dr. Mike Gip for medical oncology and she did have an MRI that I have personally reviewed.  There is evidence of the known previous right breast cancer but no evidence of metastatic disease or any other suspicious lesions within the same breast or the contralateral breast. She is doing otherwise very well and reports no symptoms.  No fevers no chills no runny nose and no close contact with COVID 19 patient's.  We had a discussion regarding surgical therapy and she wishes to do breast conservation therapy but in case something out of the ordinary were to happen she will be okay with a mastectomy.  Past Medical History:  Diagnosis Date  . Constipation due to opioid therapy   . Degenerative joint disease (DJD) of hip   . GERD (gastroesophageal reflux disease)   . Osteopenia   . Restless leg syndrome    takes Mirapex and xanax  . Scoliosis   . SUI (stress urinary incontinence, female)   . Vaso-vagal reaction    after back surgery    Past Surgical History:  Procedure Laterality Date  . APPENDECTOMY  1963  . BACK SURGERY    . BREAST BIOPSY Right 04/27/2019   Affirm Bx "X" clip-path pending  . BREAST CYST ASPIRATION Right 1988  . BREAST EXCISIONAL BIOPSY Right 1990   benign x 3  . BREAST SURGERY    . CATARACT EXTRACTION W/PHACO Right 03/31/2018   Procedure: CATARACT EXTRACTION PHACO AND INTRAOCULAR LENS PLACEMENT ([place]) RIGHT;  Surgeon: Leandrew Koyanagi, MD;  Location: [facility];  Service: Ophthalmology;  Laterality: Right;  Latex sensitivity  . CATARACT EXTRACTION W/PHACO Left 04/16/2018   Procedure: CATARACT EXTRACTION PHACO AND INTRAOCULAR LENS PLACEMENT ([place])  LEFT;  Surgeon: Leandrew Koyanagi, MD;  Location: [facility];  Service: Ophthalmology;  Laterality: Left;  . EYE SURGERY     Lasik  . FOOT SURGERY Bilateral   . HYSTEROTOMY    . LAMINECTOMY  2006  . ROTATOR CUFF REPAIR Bilateral   . TONSILLECTOMY     age 61  . TOTAL HIP ARTHROPLASTY Right 01/18/2015   Procedure: RIGHT TOTAL HIP ARTHROPLASTY ANTERIOR APPROACH;  Surgeon: Renette Butters, MD;  Location: [facility];  Service: Orthopedics;  Laterality: Right;  . TUBAL LIGATION      Family History  Problem Relation Age of Onset  . Alcohol abuse Mother   . Cancer Mother 60       lung  . Migraines Mother   . Cancer Father 77       prostate  . Diabetes Father   . Alcohol abuse Brother   . Cancer Brother        skin  . Cancer Daughter        breast  . Breast cancer Daughter 71  . Bladder Cancer Neg Hx   . Kidney cancer Neg Hx     Social History:  reports that she quit smoking about 7 years ago. Her smoking use included cigarettes. She has a 3.00 pack-year smoking history. She has never used smokeless tobacco. She reports current alcohol use of about 5.0 standard drinks of alcohol per week. She reports that she does not use drugs.  Allergies:  Allergies  Allergen Reactions  . Zostavax [Zoster Vaccine Live] Rash  . Naproxen Hives  . Penicillins Hives    Did it involve swelling of the face/tongue/throat, SOB, or low BP? No Did it involve sudden or severe rash/hives, skin peeling, or any reaction on the inside of your mouth or nose? Yes Did you need to seek medical attention at a hospital or doctor's office? Yes When did it last happen?14 or 15 If all above answers are "NO", may proceed with cephalosporin use.   . Zoloft [Sertraline Hcl] Hives  . Latex Itching and Rash    Condoms    Medications reviewed.    ROS Full ROS performed and is otherwise negative other than what is stated in HPI   BP 136/74   Pulse 66   Temp 97.7 F (36.5 C)   Ht '5\' 1"'$  (1.549 m)   Wt 174 lb 3.2 oz (79 kg)    SpO2 96%   BMI 32.91 kg/m   Physical Exam Vitals signs and nursing note reviewed. Exam conducted with a chaperone present.  Constitutional:      Appearance: Normal appearance. She is normal weight.  Eyes:     General:        Right eye: No discharge.        Left eye: No discharge.  Neck:     Musculoskeletal: Normal range of motion and neck supple. No neck rigidity or muscular tenderness.  Cardiovascular:     Rate and Rhythm: Normal rate and regular rhythm.     Pulses: Normal pulses.     Heart sounds: No murmur.  Pulmonary:     Effort: Pulmonary effort is normal. No respiratory distress.     Breath sounds: Normal breath sounds. No stridor.  Abdominal:     General: Abdomen is flat. There is no distension.     Palpations: Abdomen is soft. There is no mass.     Tenderness: There is no abdominal tenderness. There is no rebound.     Hernia: No hernia is present.  Skin:    General: Skin is warm and dry.  Neurological:     General: No focal deficit present.     Mental Status: She is alert and oriented to person, place, and time.  Psychiatric:        Mood and Affect: Mood normal.        Behavior: Behavior normal.        Thought Content: Thought content normal.        Judgment: Judgment normal.        Results for orders placed or performed during the hospital encounter of 05/05/19 (from the past 48 hour(s))  SARS Coronavirus 2 (Performed in [facility] hospital lab)     Status: None   Collection Time: 05/05/19 12:10 PM   Specimen: Nasal Swab  Result Value Ref Range   SARS Coronavirus 2 NEGATIVE NEGATIVE    Comment: (NOTE) SARS-CoV-2 target nucleic acids are NOT DETECTED. The SARS-CoV-2 RNA is generally detectable in upper and lower respiratory specimens during the acute phase of infection. Negative results do not preclude SARS-CoV-2 infection, do not rule out co-infections with other pathogens, and should not be used as the sole basis for treatment or other patient  management decisions. Negative results must be combined with clinical observations, patient history, and epidemiological information. The expected result is Negative. Fact Sheet for Patients: SugarRoll.be Fact Sheet for Healthcare Providers: https://www.woods-mathews.com/ This test is not yet approved or cleared by the [place] FDA  and  has been authorized for detection and/or diagnosis of SARS-CoV-2 by FDA under an Emergency Use Authorization (EUA). This EUA will remain  in effect (meaning this test can be used) for the duration of the COVID-19 declaration under Section 56 4(b)(1) of the Act, 21 U.S.C. section 360bbb-3(b)(1), unless the authorization is terminated or revoked sooner. 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 History of benign excisional biopsy in the RIGHT breast. LABS:  None obtained at the time of imaging. EXAM: BILATERAL BREAST MRI WITH AND WITHOUT CONTRAST TECHNIQUE: Multiplanar, multisequence MR images of both breasts were obtained prior to and following the intravenous administration of 7 ml of Gadavist Three-dimensional MR images were rendered by post-processing of the original MR data on an independent workstation. The three-dimensional MR images were interpreted, and findings are reported in the following complete MRI report for this study. Three dimensional images were evaluated at the independent DynaCad workstation COMPARISON:  04/27/2019 FINDINGS: Breast composition: b. Scattered fibroglandular tissue. Background parenchymal enhancement: Minimal Right breast: Within the LATERAL central portion of the RIGHT breast  there is post biopsy change and artifact from tissue marker clip. There is an associated rounded mass with irregular margins and plateau type enhancement kinetics. This mass measures 1.1 x 0.9 x 1.1 centimeters and is consistent with known malignancy in this portion of the breast. LATERAL to this mass there is enhancing biopsy tract. No other abnormalities are identified in the RIGHT breast. Left breast: No mass or abnormal enhancement. Lymph nodes: No abnormal appearing lymph nodes. Ancillary findings:  None. IMPRESSION: Known RIGHT breast malignancy in the LATERAL midportion of the RIGHT breast, measuring 1.1 centimeters. There is associated biopsy change. LEFT breast is negative. No evidence for adenopathy. RECOMMENDATION: Treatment plan for known malignancy. BI-RADS CATEGORY  6: Known biopsy-proven malignancy. Electronically Signed   By: Nolon Nations M.D.   On: 05/04/2019 14:19    Assessment/Plan: 68 year old female with recent diagnosis of right mammary carcinoma Right upper Outer quadrant ER and PR positive HER-2 negative. ExTensive discussion with patient regarding surgical therapy options of mastectomy versus lumpectomy radiation therapy discussed at length with her.  She chooses to have lumpectomy.  We will proceed with needle guided lumpectomy and sentinel lymph node biopsy this Friday.  We will test her for COVID and this results are pending. She discussed with patient in detail.  Risk, benefits and possible complications including but not limited to: Bleeding, infection, reexcision rate, seroma, lymphedema, breast deformity and chronic pain.  She understands and wishes to proceed.  Greater than 50% of the 40 minutes  visit was spent in counseling/coordination of care   Caroleen Hamman, MD [place] Surgeon

## 2019-05-06 NOTE — Progress Notes (Signed)
Outpatient Surgical Follow Up  05/06/2019  Elizabeth [name] is an 68 y.o. female.   Chief Complaint  Patient presents with  . Follow-up    Right breast cancer    HPI: Elizabeth [name] is following up after recently diagnosed right breast cancer.  She had an appointment with Dr. Mike Gip for medical oncology and she did have an MRI that I have personally reviewed.  There is evidence of the known previous right breast cancer but no evidence of metastatic disease or any other suspicious lesions within the same breast or the contralateral breast. She is doing otherwise very well and reports no symptoms.  No fevers no chills no runny nose and no close contact with COVID 19 patient's.  We had a discussion regarding surgical therapy and she wishes to do breast conservation therapy but in case something out of the ordinary were to happen she will be okay with a mastectomy.  Past Medical History:  Diagnosis Date  . Constipation due to opioid therapy   . Degenerative joint disease (DJD) of hip   . GERD (gastroesophageal reflux disease)   . Osteopenia   . Restless leg syndrome    takes Mirapex and xanax  . Scoliosis   . SUI (stress urinary incontinence, female)   . Vaso-vagal reaction    after back surgery    Past Surgical History:  Procedure Laterality Date  . APPENDECTOMY  1963  . BACK SURGERY    . BREAST BIOPSY Right 04/27/2019   Affirm Bx "X" clip-path pending  . BREAST CYST ASPIRATION Right 1988  . BREAST EXCISIONAL BIOPSY Right 1990   benign x 3  . BREAST SURGERY    . CATARACT EXTRACTION W/PHACO Right 03/31/2018   Procedure: CATARACT EXTRACTION PHACO AND INTRAOCULAR LENS PLACEMENT ([place]) RIGHT;  Surgeon: Leandrew Koyanagi, MD;  Location: [facility];  Service: Ophthalmology;  Laterality: Right;  Latex sensitivity  . CATARACT EXTRACTION W/PHACO Left 04/16/2018   Procedure: CATARACT EXTRACTION PHACO AND INTRAOCULAR LENS PLACEMENT ([place])  LEFT;  Surgeon: Leandrew Koyanagi, MD;  Location: [facility];  Service: Ophthalmology;  Laterality: Left;  . EYE SURGERY     Lasik  . FOOT SURGERY Bilateral   . HYSTEROTOMY    . LAMINECTOMY  2006  . ROTATOR CUFF REPAIR Bilateral   . TONSILLECTOMY     age 51  . TOTAL HIP ARTHROPLASTY Right 01/18/2015   Procedure: RIGHT TOTAL HIP ARTHROPLASTY ANTERIOR APPROACH;  Surgeon: Renette Butters, MD;  Location: [facility];  Service: Orthopedics;  Laterality: Right;  . TUBAL LIGATION      Family History  Problem Relation Age of Onset  . Alcohol abuse Mother   . Cancer Mother 28       lung  . Migraines Mother   . Cancer Father 39       prostate  . Diabetes Father   . Alcohol abuse Brother   . Cancer Brother        skin  . Cancer Daughter        breast  . Breast cancer Daughter 92  . Bladder Cancer Neg Hx   . Kidney cancer Neg Hx     Social History:  reports that she quit smoking about 7 years ago. Her smoking use included cigarettes. She has a 3.00 pack-year smoking history. She has never used smokeless tobacco. She reports current alcohol use of about 5.0 standard drinks of alcohol per week. She reports that she does not use drugs.  Allergies:  Allergies  Allergen Reactions  . Zostavax [Zoster Vaccine Live] Rash  . Naproxen Hives  . Penicillins Hives    Did it involve swelling of the face/tongue/throat, SOB, or low BP? No Did it involve sudden or severe rash/hives, skin peeling, or any reaction on the inside of your mouth or nose? Yes Did you need to seek medical attention at a hospital or doctor's office? Yes When did it last happen?14 or 16 If all above answers are "NO", may proceed with cephalosporin use.   . Zoloft [Sertraline Hcl] Hives  . Latex Itching and Rash    Condoms    Medications reviewed.    ROS Full ROS performed and is otherwise negative other than what is stated in HPI   BP 136/74   Pulse 66   Temp 97.7 F (36.5 C)   Ht '5\' 1"'$  (1.549 m)   Wt 174 lb 3.2 oz (79 kg)    SpO2 96%   BMI 32.91 kg/m   Physical Exam Vitals signs and nursing note reviewed. Exam conducted with a chaperone present.  Constitutional:      Appearance: Normal appearance. She is normal weight.  Eyes:     General:        Right eye: No discharge.        Left eye: No discharge.  Neck:     Musculoskeletal: Normal range of motion and neck supple. No neck rigidity or muscular tenderness.  Cardiovascular:     Rate and Rhythm: Normal rate and regular rhythm.     Pulses: Normal pulses.     Heart sounds: No murmur.  Pulmonary:     Effort: Pulmonary effort is normal. No respiratory distress.     Breath sounds: Normal breath sounds. No stridor.  Abdominal:     General: Abdomen is flat. There is no distension.     Palpations: Abdomen is soft. There is no mass.     Tenderness: There is no abdominal tenderness. There is no rebound.     Hernia: No hernia is present.  Skin:    General: Skin is warm and dry.  Neurological:     General: No focal deficit present.     Mental Status: She is alert and oriented to person, place, and time.  Psychiatric:        Mood and Affect: Mood normal.        Behavior: Behavior normal.        Thought Content: Thought content normal.        Judgment: Judgment normal.        Results for orders placed or performed during the hospital encounter of 05/05/19 (from the past 48 hour(s))  SARS Coronavirus 2 (Performed in [facility] hospital lab)     Status: None   Collection Time: 05/05/19 12:10 PM   Specimen: Nasal Swab  Result Value Ref Range   SARS Coronavirus 2 NEGATIVE NEGATIVE    Comment: (NOTE) SARS-CoV-2 target nucleic acids are NOT DETECTED. The SARS-CoV-2 RNA is generally detectable in upper and lower respiratory specimens during the acute phase of infection. Negative results do not preclude SARS-CoV-2 infection, do not rule out co-infections with other pathogens, and should not be used as the sole basis for treatment or other patient  management decisions. Negative results must be combined with clinical observations, patient history, and epidemiological information. The expected result is Negative. Fact Sheet for Patients: SugarRoll.be Fact Sheet for Healthcare Providers: https://www.woods-mathews.com/ This test is not yet approved or cleared by the [place] FDA  and  has been authorized for detection and/or diagnosis of SARS-CoV-2 by FDA under an Emergency Use Authorization (EUA). This EUA will remain  in effect (meaning this test can be used) for the duration of the COVID-19 declaration under Section 56 4(b)(1) of the Act, 21 U.S.C. section 360bbb-3(b)(1), unless the authorization is terminated or revoked sooner. 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 History of benign excisional biopsy in the RIGHT breast. LABS:  None obtained at the time of imaging. EXAM: BILATERAL BREAST MRI WITH AND WITHOUT CONTRAST TECHNIQUE: Multiplanar, multisequence MR images of both breasts were obtained prior to and following the intravenous administration of 7 ml of Gadavist Three-dimensional MR images were rendered by post-processing of the original MR data on an independent workstation. The three-dimensional MR images were interpreted, and findings are reported in the following complete MRI report for this study. Three dimensional images were evaluated at the independent DynaCad workstation COMPARISON:  04/27/2019 FINDINGS: Breast composition: b. Scattered fibroglandular tissue. Background parenchymal enhancement: Minimal Right breast: Within the LATERAL central portion of the RIGHT breast  there is post biopsy change and artifact from tissue marker clip. There is an associated rounded mass with irregular margins and plateau type enhancement kinetics. This mass measures 1.1 x 0.9 x 1.1 centimeters and is consistent with known malignancy in this portion of the breast. LATERAL to this mass there is enhancing biopsy tract. No other abnormalities are identified in the RIGHT breast. Left breast: No mass or abnormal enhancement. Lymph nodes: No abnormal appearing lymph nodes. Ancillary findings:  None. IMPRESSION: Known RIGHT breast malignancy in the LATERAL midportion of the RIGHT breast, measuring 1.1 centimeters. There is associated biopsy change. LEFT breast is negative. No evidence for adenopathy. RECOMMENDATION: Treatment plan for known malignancy. BI-RADS CATEGORY  6: Known biopsy-proven malignancy. Electronically Signed   By: Nolon Nations M.D.   On: 05/04/2019 14:19    Assessment/Plan: 68 year old female with recent diagnosis of right mammary carcinoma Right upper Outer quadrant ER and PR positive HER-2 negative. ExTensive discussion with patient regarding surgical therapy options of mastectomy versus lumpectomy radiation therapy discussed at length with her.  She chooses to have lumpectomy.  We will proceed with needle guided lumpectomy and sentinel lymph node biopsy this Friday.  We will test her for COVID and this results are pending. She discussed with patient in detail.  Risk, benefits and possible complications including but not limited to: Bleeding, infection, reexcision rate, seroma, lymphedema, breast deformity and chronic pain.  She understands and wishes to proceed.  Greater than 50% of the 40 minutes  visit was spent in counseling/coordination of care   Caroleen Hamman, MD [facility]

## 2019-05-06 NOTE — Patient Instructions (Signed)
Lumpectomy  A lumpectomy, sometimes called a partial mastectomy, is surgery to remove a cancerous tumor or mass (the lump) from a breast. It is a form of "breast conserving" or "breast preservation" surgery. This means that the cancerous tissue is removed but the breast remains intact. During a lumpectomy, the portion of the breast that contains the tumor is removed. Some normal tissue around the lump may be taken out to make sure that all of the tumor has been removed. Lymph nodes under your arm may also be removed and tested to find out if the cancer has spread. Lymph nodes are part of the body's disease-fighting system (immune system) and are usually the first place where breast cancer spreads. Tell a health care provider about:  Any allergies you have.  All medicines you are taking, including vitamins, herbs, eye drops, creams, and over-the-counter medicines.  Any problems you or family members have had with anesthetic medicines.  Any blood disorders you have.  Any surgeries you have had.  Any medical conditions you have.  Whether you are pregnant or may be pregnant. What are the risks? Generally, this is a safe procedure. However, problems may occur, including:  Bleeding.  Infection.  Allergic reaction to medicines.  Pain, swelling, weakness, or numbness in the arm on the side of your surgery.  Temporary swelling.  Change in the shape of the breast, particularly if a large portion is removed.  Scar tissue that forms at the surgical site and feels hard to the touch. What happens before the procedure? Staying hydrated Follow instructions from your health care provider about hydration, which may include:  Up to 2 hours before the procedure - you may continue to drink clear liquids, such as water, clear fruit juice, black coffee, and plain tea.  Eating and drinking restrictions  Follow instructions from your health care provider about eating and drinking, which may  include: ? 8 hours before the procedure - stop eating heavy meals or foods such as meat, fried foods, or fatty foods. ? 6 hours before the procedure - stop eating light meals or foods, such as toast or cereal. ? 6 hours before the procedure - stop drinking milk or drinks that contain milk. ? 2 hours before the procedure - stop drinking clear liquids. Medicines  Ask your health care provider about: ? Changing or stopping your regular medicines. This is especially important if you are taking diabetes medicines or blood thinners. ? Taking medicines such as aspirin and ibuprofen. These medicines can thin your blood. Do not take these medicines before your procedure if your health care provider instructs you not to.  You may be given antibiotic medicine to help prevent infection. General instructions  Prior to surgery, your health care provider may do a procedure to locate and mark the tumor area in your breast (localization). This procedure will help guide your surgeon to where the incision will be made. This may be done with: ? Imaging. This may include a mammogram, ultrasound, or MRI. ? Insertion of a small wire, clip, seed, or radar reflector implant.  You may be screened for extra fluid around the lymph nodes (lymphedema).  Ask your health care provider how your surgical site will be marked or identified.  Plan to have someone take you home from the hospital or clinic. What happens during the procedure?   To lower your risk of infection: ? Your health care team will wash or sanitize their hands. ? Your skin will be washed with soap.  An IV will be inserted into one of your veins.  You will be given one or more of the following: ? A medicine to help you relax (sedative). ? A medicine to numb the area (local anesthetic). ? A medicine to make you fall asleep (general anesthetic).  Your health care provider will use a kind of electric scalpel that uses heat to minimize bleeding  (electrocautery knife). A curved incision that follows the natural curve of your breast will be made. This type of incision will allow for minimal scarring and better healing.  The tumor will be removed along with some of the surrounding tissue. This will be sent to the lab for testing. Your health care provider may also remove lymph nodes at this time if needed.  If the tumor is close to the muscles over your chest, some muscle tissue may also be removed.  A small drain tube may be inserted into your breast area or armpit to collect fluid that may build up after surgery. This tube will be connected to a suction bulb on the outside of your body to remove the fluid.  The incision will be closed with stitches (sutures).  A bandage (dressing) may be placed over the incision. The procedure may vary among health care providers and hospitals. What happens after the procedure?  Your blood pressure, heart rate, breathing rate, and blood oxygen level will be monitored until you leave the hospital or clinic.  You will be given medicine for pain as needed.  Your IV will be removed when you are able to eat and drink by mouth.  You will be encouraged to get up and walk as soon as you can. This is important to improve blood flow and breathing. Ask for help if you feel weak or unsteady.  You may have a drain tube in place for 2-3 days to prevent a collection of blood (hematoma) from developing in the breast. You will be given instructions about caring for the drain before you go home.  A pressure bandage may be applied for 1-2 days to prevent bleeding or swelling. Your pressure bandage may look like a thick piece of fabric or an elastic wrap. Ask your health care provider how to care for your bandage at home.  You may be given a tight sleeve to wear over your arm on the side of your surgery. You should wear this sleeve as told by your health care provider.  Do not drive for 24 hours if you were given a  sedative during your procedure. Summary  A lumpectomy, sometimes called a partial mastectomy, is surgery to remove a cancerous tumor or mass (the lump) from a breast.  During a lumpectomy, the portion of the breast that contains the tumor is removed. Some normal tissue around the lump may be taken out to make sure that all of the tumor has been removed. Lymph nodes under your arm may also be removed and tested to find out if the cancer has spread.  You may have a drain tube in place for 2-3 days to prevent a collection of blood (hematoma) from developing in the breast. You will be given instructions about caring for the drain before you go home.  Plan to have someone take you home from the hospital or clinic. This information is not intended to replace advice given to you by your health care provider. Make sure you discuss any questions you have with your health care provider. Document Released: 11/05/2006 Document Revised: 03/25/2018  Document Reviewed: 06/06/2016 Elsevier Patient Education  [employer].

## 2019-05-07 ENCOUNTER — Encounter
Admission: RE | Admit: 2019-05-07 | Discharge: 2019-05-07 | Disposition: A | Discharge status: Home / Self Care | Payer: Federal, State, Local not specified - PPO | Source: Ambulatory Visit | Attending: Surgery | Admitting: Surgery

## 2019-05-07 ENCOUNTER — Other Ambulatory Visit: Payer: Self-pay

## 2019-05-07 MED ORDER — CEFAZOLIN SODIUM-DEXTROSE 2-4 GM/100ML-% IV SOLN
2.0000 g | INTRAVENOUS | Status: AC
  Administered 2019-05-08: 2 g via INTRAVENOUS

## 2019-05-07 NOTE — Patient Instructions (Addendum)
Your procedure is scheduled on: 05/08/2019 Fri Report to breast center per their instructed time  Remember: Instructions that are not followed completely may result in serious medical risk, up to and including death, or upon the discretion of your surgeon and anesthesiologist your surgery may need to be rescheduled.    _x___ 1. Do not eat food after midnight the night before your procedure. You may drink clear liquids up to 2 hours before you are scheduled to arrive at the hospital for your procedure.  Do not drink clear liquids within 2 hours of your scheduled arrival to the hospital.  Clear liquids include  --Water or Apple juice without pulp  --Clear carbohydrate beverage such as ClearFast or Gatorade  --Black Coffee or Clear Tea (No milk, no creamers, do not add anything to                  the coffee or Tea Type 1 and type 2 diabetics should only drink water.   ____Ensure clear carbohydrate drink on the way to the hospital for bariatric patients  ____Ensure clear carbohydrate drink 3 hours before surgery.   No gum chewing or hard candies.     __x__ 2. No Alcohol for 24 hours before or after surgery.   __x__3. No Smoking or e-cigarettes for 24 prior to surgery.  Do not use any chewable tobacco products for at least 6 hour prior to surgery   ____  4. Bring all medications with you on the day of surgery if instructed.    __x__ 5. Notify your doctor if there is any change in your medical condition     (cold, fever, infections).    x___6. On the morning of surgery brush your teeth with toothpaste and water.  You may rinse your mouth with mouth wash if you wish.  Do not swallow any toothpaste or mouthwash.   Do not wear jewelry, make-up, hairpins, clips or nail polish.  Do not wear lotions, powders, or perfumes. You may wear deodorant.  Do not shave 48 hours prior to surgery. Men may shave face and neck.  Do not bring valuables to the hospital.    [facility] is not responsible for  any belongings or valuables.               Contacts, dentures or bridgework may not be worn into surgery.  Leave your suitcase in the car. After surgery it may be brought to your room.  For patients admitted to the hospital, discharge time is determined by your                       treatment team.  _  Patients discharged the day of surgery will not be allowed to drive home.  You will need someone to drive you home and stay with you the night of your procedure.    Please read over the following fact sheets that you were given:   [facility] Preparing for Surgery and or MRSA Information   _x___ Take anti-hypertensive listed below, cardiac, seizure, asthma,     anti-reflux and psychiatric medicines. These include:  1. omeprazole (PRILOSEC) 20 MG capsule the night before and the morning of surgery.  2.  3.  4.  5.  6.  ____Fleets enema or Magnesium Citrate as directed.   _x___ Use Dial Soap or sage wipes as directed on instruction sheet   ____ Use inhalers on the day of surgery and bring to hospital day  of surgery  ____ Stop Metformin and Janumet 2 days prior to surgery.    ____ Take 1/2 of usual insulin dose the night before surgery and none on the morning     surgery.   _x___ Follow recommendations from Cardiologist, Pulmonologist or PCP regarding          stopping Aspirin, Coumadin, Plavix ,Eliquis, Effient, or Pradaxa, and Pletal.  X____Stop Anti-inflammatories such as Advil, Aleve, Ibuprofen, Motrin, Naproxen, Naprosyn, Goodies powders or aspirin products. OK to take Tylenol and                          Celebrex.   _x___ Stop supplements until after surgery.  But may continue Vitamin D, Vitamin B,       and multivitamin.   ____ Bring C-Pap to the hospital.

## 2019-05-08 ENCOUNTER — Encounter
Admission: RE | Disposition: A | Discharge status: Home / Self Care | Payer: Self-pay | Source: Home / Self Care | Attending: Surgery

## 2019-05-08 ENCOUNTER — Ambulatory Visit
Admission: RE | Admit: 2019-05-08 | Discharge: 2019-05-08 | Disposition: A | Discharge status: Home / Self Care | Payer: Federal, State, Local not specified - PPO | Attending: Surgery | Admitting: Surgery

## 2019-05-08 ENCOUNTER — Ambulatory Visit: Payer: Federal, State, Local not specified - PPO

## 2019-05-08 ENCOUNTER — Other Ambulatory Visit: Payer: Self-pay

## 2019-05-08 ENCOUNTER — Ambulatory Visit: Payer: Federal, State, Local not specified - PPO | Admitting: Anesthesiology

## 2019-05-08 ENCOUNTER — Ambulatory Visit
Admission: RE | Admit: 2019-05-08 | Discharge: 2019-05-08 | Disposition: A | Discharge status: Home / Self Care | Payer: Federal, State, Local not specified - PPO | Source: Ambulatory Visit | Attending: Surgery | Admitting: Surgery

## 2019-05-08 DIAGNOSIS — G2581 Restless legs syndrome: Secondary | ICD-10-CM | POA: Diagnosis not present

## 2019-05-08 DIAGNOSIS — Z170 Estrogen receptor positive status [ER+]: Secondary | ICD-10-CM | POA: Insufficient documentation

## 2019-05-08 DIAGNOSIS — Z87891 Personal history of nicotine dependence: Secondary | ICD-10-CM | POA: Insufficient documentation

## 2019-05-08 DIAGNOSIS — C50411 Malignant neoplasm of upper-outer quadrant of right female breast: Secondary | ICD-10-CM

## 2019-05-08 DIAGNOSIS — I2510 Atherosclerotic heart disease of native coronary artery without angina pectoris: Secondary | ICD-10-CM | POA: Diagnosis not present

## 2019-05-08 DIAGNOSIS — Z96641 Presence of right artificial hip joint: Secondary | ICD-10-CM | POA: Insufficient documentation

## 2019-05-08 HISTORY — PX: BREAST LUMPECTOMY: SHX2

## 2019-05-08 HISTORY — PX: BREAST LUMPECTOMY WITH SENTINEL LYMPH NODE BIOPSY: SHX5597

## 2019-05-08 SURGERY — BREAST LUMPECTOMY WITH SENTINEL LYMPH NODE BX
Anesthesia: General | Laterality: Right

## 2019-05-08 MED ORDER — HYDROCODONE-ACETAMINOPHEN 5-325 MG PO TABS: 1.0000 | ORAL_TABLET | Freq: Four times a day (QID) | ORAL | 0 refills | Status: DC | PRN

## 2019-05-08 MED ORDER — PROPOFOL 10 MG/ML IV BOLUS
INTRAVENOUS | Status: AC
  Filled 2019-05-08: qty 40

## 2019-05-08 MED ORDER — GABAPENTIN 300 MG PO CAPS
ORAL_CAPSULE | ORAL | Status: AC
  Administered 2019-05-08: 13:00:00 300 mg via ORAL
  Filled 2019-05-08: qty 1

## 2019-05-08 MED ORDER — HYDROMORPHONE HCL 1 MG/ML IJ SOLN
INTRAMUSCULAR | Status: AC
  Filled 2019-05-08: qty 1

## 2019-05-08 MED ORDER — ACETAMINOPHEN 500 MG PO TABS
ORAL_TABLET | ORAL | Status: AC
  Filled 2019-05-08: qty 2

## 2019-05-08 MED ORDER — GABAPENTIN 300 MG PO CAPS
300.0000 mg | ORAL_CAPSULE | ORAL | Status: AC
  Administered 2019-05-08: 300 mg via ORAL

## 2019-05-08 MED ORDER — SUGAMMADEX SODIUM 200 MG/2ML IV SOLN
INTRAVENOUS | Status: DC | PRN
  Administered 2019-05-08: 200 mg via INTRAVENOUS

## 2019-05-08 MED ORDER — ONDANSETRON HCL 4 MG/2ML IJ SOLN
INTRAMUSCULAR | Status: DC | PRN
  Administered 2019-05-08: 4 mg via INTRAVENOUS

## 2019-05-08 MED ORDER — HYDROMORPHONE HCL 1 MG/ML IJ SOLN
0.2500 mg | INTRAMUSCULAR | Status: AC | PRN
  Administered 2019-05-08 (×2): 0.25 mg via INTRAVENOUS

## 2019-05-08 MED ORDER — DEXAMETHASONE SODIUM PHOSPHATE 10 MG/ML IJ SOLN
INTRAMUSCULAR | Status: DC | PRN
  Administered 2019-05-08: 10 mg via INTRAVENOUS

## 2019-05-08 MED ORDER — HYDROCODONE-ACETAMINOPHEN 5-325 MG PO TABS
1.0000 | ORAL_TABLET | Freq: Four times a day (QID) | ORAL | Status: DC | PRN
  Administered 2019-05-08 (×2): 1 via ORAL

## 2019-05-08 MED ORDER — FENTANYL CITRATE (PF) 100 MCG/2ML IJ SOLN
INTRAMUSCULAR | Status: DC | PRN
  Administered 2019-05-08 (×2): 50 ug via INTRAVENOUS
  Administered 2019-05-08: 100 ug via INTRAVENOUS

## 2019-05-08 MED ORDER — CEFAZOLIN SODIUM-DEXTROSE 2-4 GM/100ML-% IV SOLN
INTRAVENOUS | Status: AC
  Filled 2019-05-08: qty 100

## 2019-05-08 MED ORDER — FENTANYL CITRATE (PF) 100 MCG/2ML IJ SOLN
INTRAMUSCULAR | Status: AC
  Filled 2019-05-08: qty 2

## 2019-05-08 MED ORDER — ACETAMINOPHEN 500 MG PO TABS
1000.0000 mg | ORAL_TABLET | ORAL | Status: AC
  Administered 2019-05-08: 1000 mg via ORAL

## 2019-05-08 MED ORDER — HYDROCODONE-ACETAMINOPHEN 5-325 MG PO TABS
ORAL_TABLET | ORAL | Status: AC
  Administered 2019-05-08: 1 via ORAL
  Filled 2019-05-08: qty 1

## 2019-05-08 MED ORDER — HYDROCODONE-ACETAMINOPHEN 5-325 MG PO TABS
ORAL_TABLET | ORAL | Status: AC
  Filled 2019-05-08: qty 1

## 2019-05-08 MED ORDER — ONDANSETRON HCL 4 MG/2ML IJ SOLN: 4.0000 mg | Freq: Once | INTRAMUSCULAR | Status: DC | PRN

## 2019-05-08 MED ORDER — CHLORHEXIDINE GLUCONATE CLOTH 2 % EX PADS: 6.0000 | MEDICATED_PAD | Freq: Once | CUTANEOUS | Status: DC

## 2019-05-08 MED ORDER — MIDAZOLAM HCL 2 MG/2ML IJ SOLN
INTRAMUSCULAR | Status: AC
  Filled 2019-05-08: qty 2

## 2019-05-08 MED ORDER — TECHNETIUM TC 99M SULFUR COLLOID FILTERED
1.0000 | Freq: Once | INTRAVENOUS | Status: AC | PRN
  Administered 2019-05-08: 0.845 via INTRADERMAL

## 2019-05-08 MED ORDER — METHYLENE BLUE 0.5 % INJ SOLN
INTRAVENOUS | Status: DC | PRN
  Administered 2019-05-08: 5 mL

## 2019-05-08 MED ORDER — ROCURONIUM BROMIDE 100 MG/10ML IV SOLN
INTRAVENOUS | Status: DC | PRN
  Administered 2019-05-08: 20 mg via INTRAVENOUS
  Administered 2019-05-08: 30 mg via INTRAVENOUS

## 2019-05-08 MED ORDER — HYDROMORPHONE HCL 1 MG/ML IJ SOLN
0.2500 mg | INTRAMUSCULAR | Status: DC | PRN
  Administered 2019-05-08 (×4): 0.25 mg via INTRAVENOUS

## 2019-05-08 MED ORDER — BUPIVACAINE-EPINEPHRINE 0.25% -1:200000 IJ SOLN
INTRAMUSCULAR | Status: DC | PRN
  Administered 2019-05-08: 20 mL

## 2019-05-08 MED ORDER — LIDOCAINE HCL (CARDIAC) PF 100 MG/5ML IV SOSY
PREFILLED_SYRINGE | INTRAVENOUS | Status: DC | PRN
  Administered 2019-05-08: 100 mg via INTRAVENOUS

## 2019-05-08 MED ORDER — FENTANYL CITRATE (PF) 100 MCG/2ML IJ SOLN
25.0000 ug | INTRAMUSCULAR | Status: DC | PRN
  Administered 2019-05-08 (×4): 25 ug via INTRAVENOUS

## 2019-05-08 MED ORDER — SODIUM CHLORIDE FLUSH 0.9 % IV SOLN
INTRAVENOUS | Status: AC
  Filled 2019-05-08: qty 10

## 2019-05-08 MED ORDER — MIDAZOLAM HCL 2 MG/2ML IJ SOLN
INTRAMUSCULAR | Status: DC | PRN
  Administered 2019-05-08: 2 mg via INTRAVENOUS

## 2019-05-08 MED ORDER — LACTATED RINGERS IV SOLN
INTRAVENOUS | Status: DC
  Administered 2019-05-08: 13:00:00 via INTRAVENOUS

## 2019-05-08 MED ORDER — PROPOFOL 10 MG/ML IV BOLUS
INTRAVENOUS | Status: DC | PRN
  Administered 2019-05-08: 200 mg via INTRAVENOUS

## 2019-05-08 SURGICAL SUPPLY — 45 items
ADH SKN CLS APL DERMABOND .7 (GAUZE/BANDAGES/DRESSINGS) ×2
APL PRP STRL LF DISP 70% ISPRP (MISCELLANEOUS) ×1
APPLIER CLIP 9.375 SM OPEN (CLIP) ×2
APR CLP SM 9.3 20 MLT OPN (CLIP) ×1
BLADE SURG 15 STRL LF DISP TIS (BLADE) ×1 IMPLANT
BLADE SURG 15 STRL SS (BLADE) ×2
CANISTER SUCT 1200ML W/VALVE (MISCELLANEOUS) ×2 IMPLANT
CHLORAPREP W/TINT 26 (MISCELLANEOUS) ×2 IMPLANT
CLIP APPLIE 9.375 SM OPEN (CLIP) IMPLANT
CNTNR SPEC 2.5X3XGRAD LEK (MISCELLANEOUS) ×2
CONT SPEC 4OZ STER OR WHT (MISCELLANEOUS) ×2
CONT SPEC 4OZ STRL OR WHT (MISCELLANEOUS) ×2
CONTAINER SPEC 2.5X3XGRAD LEK (MISCELLANEOUS) ×2 IMPLANT
COVER WAND RF STERILE (DRAPES) ×1 IMPLANT
DERMABOND ADVANCED (GAUZE/BANDAGES/DRESSINGS) ×2
DERMABOND ADVANCED .7 DNX12 (GAUZE/BANDAGES/DRESSINGS) ×1 IMPLANT
DRAPE LAPAROTOMY TRNSV 106X77 (MISCELLANEOUS) ×2 IMPLANT
ELECT CAUTERY BLADE 6.4 (BLADE) ×2 IMPLANT
ELECT REM PT RETURN 9FT ADLT (ELECTROSURGICAL) ×2
ELECTRODE REM PT RTRN 9FT ADLT (ELECTROSURGICAL) ×1 IMPLANT
GLOVE BIO SURGEON STRL SZ7 (GLOVE) ×2 IMPLANT
GLOVE INDICATOR 7.5 STRL GRN (GLOVE) ×2 IMPLANT
GOWN STRL REUS W/ TWL LRG LVL3 (GOWN DISPOSABLE) ×2 IMPLANT
GOWN STRL REUS W/TWL LRG LVL3 (GOWN DISPOSABLE) ×6
KIT TURNOVER KIT A (KITS) ×2 IMPLANT
LABEL OR SOLS (LABEL) ×2 IMPLANT
MARGIN MAP 10MM (MISCELLANEOUS) ×2 IMPLANT
NDL FILTER BLUNT 18X1 1/2 (NEEDLE) ×1 IMPLANT
NEEDLE FILTER BLUNT 18X 1/2SAF (NEEDLE) ×1
NEEDLE FILTER BLUNT 18X1 1/2 (NEEDLE) ×1 IMPLANT
NEEDLE HYPO 22GX1.5 SAFETY (NEEDLE) ×2 IMPLANT
PACK BASIN MINOR ARMC (MISCELLANEOUS) ×2 IMPLANT
SPONGE KITTNER 5P (MISCELLANEOUS) IMPLANT
SPONGE LAP 18X18 RF (DISPOSABLE) ×2 IMPLANT
SUT MNCRL 4-0 (SUTURE) ×4
SUT MNCRL 4-0 27XMFL (SUTURE) ×2
SUT SILK 2 0 SH (SUTURE) ×2 IMPLANT
SUT VIC AB 2-0 SH 27 (SUTURE) ×2
SUT VIC AB 2-0 SH 27XBRD (SUTURE) IMPLANT
SUT VIC AB 3-0 SH 27 (SUTURE) ×4
SUT VIC AB 3-0 SH 27X BRD (SUTURE) ×2 IMPLANT
SUTURE MNCRL 4-0 27XMF (SUTURE) ×2 IMPLANT
SYR 10ML LL (SYRINGE) ×2 IMPLANT
SYR 20ML LL LF (SYRINGE) ×2 IMPLANT
WATER STERILE IRR 1000ML POUR (IV SOLUTION) ×2 IMPLANT

## 2019-05-08 NOTE — OR Nursing (Signed)
Clarified antibiotic orders and allergies with Dr Dahlia Byes.  No new orders at this time.

## 2019-05-08 NOTE — Anesthesia Procedure Notes (Signed)
Procedure Name: Intubation Date/Time: 05/08/2019 1:38 PM Performed by: Leander Rams, CRNA Pre-anesthesia Checklist: Patient identified, Emergency Drugs available, Suction available, Patient being monitored and Timeout performed Patient Re-evaluated:Patient Re-evaluated prior to induction Oxygen Delivery Method: Circle system utilized Preoxygenation: Pre-oxygenation with 100% oxygen Induction Type: IV induction Ventilation: Mask ventilation with difficulty and Oral airway inserted - appropriate to patient size Laryngoscope Size: McGraph, Mac and 3 Grade View: Grade I Tube type: Oral Tube size: 7.0 mm Number of attempts: 1 Airway Equipment and Method: Stylet

## 2019-05-08 NOTE — Anesthesia Post-op Follow-up Note (Signed)
Anesthesia QCDR form completed.        

## 2019-05-08 NOTE — Discharge Instructions (Addendum)
AMBULATORY SURGERY  °DISCHARGE INSTRUCTIONS ° ° °1) The drugs that you were given will stay in your system until tomorrow so for the next 24 hours you should not: ° °A) Drive an automobile °B) Make any legal decisions °C) Drink any alcoholic beverage ° ° °2) You may resume regular meals tomorrow.  Today it is better to start with liquids and gradually work up to solid foods. ° °You may eat anything you prefer, but it is better to start with liquids, then soup and crackers, and gradually work up to solid foods. ° ° °3) Please notify your doctor immediately if you have any unusual bleeding, trouble breathing, redness and pain at the surgery site, drainage, fever, or pain not relieved by medication. ° ° ° °4) Additional Instructions: ° ° ° ° ° ° ° °Please contact your physician with any problems or Same Day Surgery at [phone], Monday through Friday 6 am to 4 pm, or [place] [facility] [place] Main number at [phone]. °

## 2019-05-08 NOTE — Op Note (Signed)
  Pre-operative Diagnosis: Right Outer Upper Breast Cancer    Post-operative Diagnosis: Same   Surgeon: Caroleen Hamman, MD FACS  Anesthesia: LMA  Procedure: Right Partial mastectomy, needle directed, sentinel node biopsy   Findings:Specimen xray w clip , mass and wire within excised specimen Gross margins by path clear.  Estimated Blood Loss: Minimal         Drains: None         Specimens: partial mastectomy with labels.       Complications: none                 Condition: Stable    Procedure Details  The patient was seen again in the Holding Room. The benefits, complications, treatment options, and expected outcomes were discussed with the patient. The risks of bleeding, infection, recurrence of symptoms, failure to resolve symptoms, hematoma, seroma, open wound, cosmetic deformity, and the need for further surgery were discussed.  The patient was taken to Operating Room, identified as Elizabeth [name] and the procedure verified.  A Time Out was held and the above information confirmed.  Prior to the induction of general anesthesia, antibiotic prophylaxis was administered. VTE prophylaxis was in place. Appropriate anesthesia was then administered and tolerated well. The chest was prepped with Chloraprep and draped in the sterile fashion. The patient was positioned in the supine position.   A visual dye was injected periareolar early under aseptic conditions. Then using the hand-held probe an area of high counts was identified in the axilla, an incision was made and direction by the probe aided in dissection of a lymph node which was sent for pathology.  Attention was turned to the needle localization site where an incision was made after ultrasound was used to located the wire and the hook. Dissection around the needle to perform a partial mastectomy with adequate margins was performed. This was done with electrocautery and sharp dissection. Hemostasis was with  electrocautery. A. Once assuring that hemostasis was adequate and checked multiple times the wound was closed with interrupted 3-0 Vicryl followed by 4-0 subcuticular Monocryl sutures.  The axillary wound was closed in a similar fashion.  Dermabond was placed.  Patient was taken to the recovery room in stable condition where a postoperative chest film has been ordered.   Caroleen Hamman, MD, FACS

## 2019-05-08 NOTE — Anesthesia Preprocedure Evaluation (Addendum)
Anesthesia Evaluation  Patient identified by MRN, date of birth, ID band Patient awake    Reviewed: Allergy & Precautions, H&P , NPO status , Patient's Chart, lab work & pertinent test results, reviewed documented beta blocker date and time   Airway Mallampati: II  TM Distance: >3 FB Neck ROM: full    Dental no notable dental hx.    Pulmonary neg pulmonary ROS, former smoker,    Pulmonary exam normal breath sounds clear to auscultation       Cardiovascular Exercise Tolerance: Good + angina + CAD  Normal cardiovascular exam Rhythm:regular Rate:Normal     Neuro/Psych  Headaches, PSYCHIATRIC DISORDERS Anxiety Depression    GI/Hepatic Neg liver ROS, GERD  Controlled,  Endo/Other  negative endocrine ROS  Renal/GU negative Renal ROS  negative genitourinary   Musculoskeletal  (+) Arthritis , Osteoarthritis,    Abdominal   Peds negative pediatric ROS (+)  Hematology negative hematology ROS (+)   Anesthesia Other Findings   Reproductive/Obstetrics negative OB ROS                            Anesthesia Physical  Anesthesia Plan  ASA: III  Anesthesia Plan: General   Post-op Pain Management:    Induction: Intravenous  PONV Risk Score and Plan:   Airway Management Planned: Oral ETT  Additional Equipment:   Intra-op Plan:   Post-operative Plan: Extubation in OR  Informed Consent: I have reviewed the patients History and Physical, chart, labs and discussed the procedure including the risks, benefits and alternatives for the proposed anesthesia with the patient or authorized representative who has indicated his/her understanding and acceptance.     Dental Advisory Given  Plan Discussed with: CRNA  Anesthesia Plan Comments:        Anesthesia Quick Evaluation

## 2019-05-08 NOTE — Anesthesia Postprocedure Evaluation (Signed)
Anesthesia Post Note  Patient: Elizabeth [name]  Procedure(s) Performed: RIGHT BREAST LUMPECTOMY WITH SENTINEL LYMPH NODE BX AND WIRE LOC., LATEX ALLERGY (Right )  Patient location during evaluation: PACU Anesthesia Type: General Level of consciousness: awake and alert Pain management: pain level controlled Vital Signs Assessment: post-procedure vital signs reviewed and stable Respiratory status: spontaneous breathing and respiratory function stable Cardiovascular status: stable Anesthetic complications: no     Last Vitals:  Vitals:   05/08/19 1534 05/08/19 1538  BP:  138/74  Pulse: 61 (!) 58  Resp: 13 16  Temp:    SpO2: 95% 98%    Last Pain:  Vitals:   05/08/19 1538  TempSrc:   PainSc: 7                  Dean Wonder K

## 2019-05-08 NOTE — Interval H&P Note (Signed)
History and Physical Interval Note:  05/08/2019 10:14 AM  Elizabeth [name]  has presented today for surgery, with the diagnosis of C50.411 BREAST CANCER.  The various methods of treatment have been discussed with the patient and family. After consideration of risks, benefits and other options for treatment, the patient has consented to  Procedure(s): RIGHT BREAST LUMPECTOMY WITH SENTINEL LYMPH NODE BX AND WIRE LOC., LATEX ALLERGY (Right) as a surgical intervention.  The patient's history has been reviewed, patient examined, no change in status, stable for surgery.  I have reviewed the patient's chart and labs.  Questions were answered to the patient's satisfaction.     [place]

## 2019-05-08 NOTE — Transfer of Care (Signed)
Immediate Anesthesia Transfer of Care Note  Patient: Elizabeth [name]  Procedure(s) Performed: RIGHT BREAST LUMPECTOMY WITH SENTINEL LYMPH NODE BX AND WIRE LOC., LATEX ALLERGY (Right )  Patient Location: PACU  Anesthesia Type:General  Level of Consciousness: awake, oriented, drowsy and patient cooperative  Airway & Oxygen Therapy: Patient Spontanous Breathing  Post-op Assessment: Report given to RN, Post -op Vital signs reviewed and stable and Patient moving all extremities  Post vital signs: Reviewed and stable  Last Vitals:  Vitals Value Taken Time  BP 141/77 05/08/19 1509  Temp    Pulse 63 05/08/19 1511  Resp 15 05/08/19 1511  SpO2 100 % 05/08/19 1511  Vitals shown include unvalidated device data.  Last Pain:  Vitals:   05/08/19 0855  TempSrc: Temporal  PainSc: 0-No pain         Complications: No apparent anesthesia complications

## 2019-05-10 ENCOUNTER — Encounter: Payer: Self-pay | Admitting: Hematology and Oncology

## 2019-05-11 ENCOUNTER — Inpatient Hospital Stay: Admission: RE | Admit: 2019-05-11 | Payer: Federal, State, Local not specified - PPO | Source: Ambulatory Visit

## 2019-05-11 ENCOUNTER — Ambulatory Visit: Payer: Self-pay | Admitting: Surgery

## 2019-05-13 ENCOUNTER — Encounter: Payer: Self-pay | Admitting: Hematology and Oncology

## 2019-05-13 LAB — SURGICAL PATHOLOGY

## 2019-05-13 NOTE — Telephone Encounter (Signed)
Pt d/w pt in detail about path , she is doing well She vey appreciative

## 2019-05-14 ENCOUNTER — Telehealth: Payer: Self-pay

## 2019-05-14 NOTE — Telephone Encounter (Signed)
Spoke with the patient to inform her that she will need to keep schedule appointment as schedule for Dr Donella Stade and also informed her that Dr Donella Stade is not the oncologist he is the provider who does radiation. The patient was somewhat confused of who would be doing radiation or who would be doing her chemo if she needed it. I explained to the patient that Dr Donella Stade and Dr Mike Gip work hand and Dr Mike Gip would need her to keep the schedule appointment that she have with Dr Donella Stade. Per Dr Mike Gip the patient is to give the office a call after she is 2 weeks out from surgery , but she was determined to make an appointment today which I told her it need to be after the 14 of August. I also explained to her that Dr Mike Gip has ordered other test that will need to be finalized  Post surgery. The patient was transferred to Ms Shirlean Mylar and the patient has been schedule.  The patient was agreeable and understanding to keep all schedule appointment.

## 2019-05-15 ENCOUNTER — Other Ambulatory Visit: Payer: Self-pay | Admitting: Family Medicine

## 2019-05-15 DIAGNOSIS — F419 Anxiety disorder, unspecified: Secondary | ICD-10-CM

## 2019-05-19 ENCOUNTER — Other Ambulatory Visit: Payer: Self-pay

## 2019-05-20 ENCOUNTER — Ambulatory Visit
Admission: RE | Admit: 2019-05-20 | Discharge: 2019-05-20 | Disposition: A | Discharge status: Home / Self Care | Payer: Federal, State, Local not specified - PPO | Source: Ambulatory Visit | Attending: Radiation Oncology | Admitting: Radiation Oncology

## 2019-05-20 ENCOUNTER — Encounter: Payer: Self-pay | Admitting: Radiation Oncology

## 2019-05-20 ENCOUNTER — Other Ambulatory Visit: Payer: Self-pay

## 2019-05-20 ENCOUNTER — Encounter: Payer: Self-pay | Admitting: Surgery

## 2019-05-20 ENCOUNTER — Ambulatory Visit (INDEPENDENT_AMBULATORY_CARE_PROVIDER_SITE_OTHER): Payer: Federal, State, Local not specified - PPO | Admitting: Surgery

## 2019-05-20 ENCOUNTER — Encounter: Payer: Self-pay | Admitting: Hematology and Oncology

## 2019-05-20 VITALS — BP 134/81 | HR 76 | Temp 97.5°F | Ht 61.0 in | Wt 172.0 lb

## 2019-05-20 VITALS — BP 122/71 | HR 69 | Temp 95.8°F | Resp 18 | Wt 173.0 lb

## 2019-05-20 DIAGNOSIS — F419 Anxiety disorder, unspecified: Secondary | ICD-10-CM | POA: Insufficient documentation

## 2019-05-20 DIAGNOSIS — Z79899 Other long term (current) drug therapy: Secondary | ICD-10-CM | POA: Diagnosis not present

## 2019-05-20 DIAGNOSIS — Z09 Encounter for follow-up examination after completed treatment for conditions other than malignant neoplasm: Secondary | ICD-10-CM

## 2019-05-20 DIAGNOSIS — Z170 Estrogen receptor positive status [ER+]: Secondary | ICD-10-CM

## 2019-05-20 DIAGNOSIS — G2581 Restless legs syndrome: Secondary | ICD-10-CM | POA: Insufficient documentation

## 2019-05-20 DIAGNOSIS — Z803 Family history of malignant neoplasm of breast: Secondary | ICD-10-CM | POA: Diagnosis not present

## 2019-05-20 DIAGNOSIS — C50411 Malignant neoplasm of upper-outer quadrant of right female breast: Secondary | ICD-10-CM | POA: Diagnosis not present

## 2019-05-20 DIAGNOSIS — Z87891 Personal history of nicotine dependence: Secondary | ICD-10-CM | POA: Diagnosis not present

## 2019-05-20 DIAGNOSIS — M8580 Other specified disorders of bone density and structure, unspecified site: Secondary | ICD-10-CM | POA: Diagnosis not present

## 2019-05-20 DIAGNOSIS — Z809 Family history of malignant neoplasm, unspecified: Secondary | ICD-10-CM | POA: Insufficient documentation

## 2019-05-20 MED ORDER — CEPHALEXIN 500 MG PO CAPS: 500.0000 mg | ORAL_CAPSULE | Freq: Four times a day (QID) | ORAL | 0 refills | Status: AC

## 2019-05-20 NOTE — Patient Instructions (Signed)
The patient is aware to call back for any questions or new concerns.  

## 2019-05-20 NOTE — Consult Note (Signed)
NEW PATIENT EVALUATION  Name: Elizabeth [name]  MRN: [id number]  Date:   05/20/2019     DOB: [birth year]   This 68 y.o. 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  Tumor has lobular features  REFERRING PHYSICIAN: Birdie Sons, MD  CHIEF COMPLAINT:  Chief Complaint  Patient presents with  . Breast Cancer    Initial consultation    DIAGNOSIS: The encounter diagnosis was Malignant neoplasm of upper-outer quadrant of right breast in female, estrogen receptor positive ([place]).   PREVIOUS INVESTIGATIONS:  Mammogram ultrasound and MRI scans reviewed Clinical notes reviewed Pathology report reviewed  HPI: Patient is a 68 year old female who presented with an abnormal mammogram of her right breast.  There was breast asymmetry in the superior portion of the right breast confirmed ultrasound for which stereotactic biopsy was performed.  Tumor was positive for invasive mammary carcinoma ER PR positive with lobular features.  Because of the lobular features MRI scan was performed showing right breast malignancy in the lateral midportion of the right breast measuring 1.1 cm.  No evidence of adenopathy was noted.  Patient underwent wide local excision and sentinel node biopsy.  Tumor was 1.1 cm in greatest dimension overall grade 2 invasive mammary carcinoma with lobular features.  One sentinel lymph node was negative for metastatic disease.  Margins were clear by at least 1 cm.  Patient is seen today for evaluation of radiation therapy.  I believe an Oncotype DX has been ordered and is pending.  She specifically denies breast tenderness cough or bone pain.  PLANNED TREATMENT REGIMEN: Right whole breast radiation  PAST MEDICAL HISTORY:  has a past medical history of Anginal pain ([place]), Anxiety,  Constipation due to opioid therapy, Degenerative joint disease (DJD) of hip, GERD (gastroesophageal reflux disease), Osteopenia, Restless leg syndrome, Scoliosis, SUI (stress urinary incontinence, female), and Vaso-vagal reaction.    PAST SURGICAL HISTORY:  Past Surgical History:  Procedure Laterality Date  . APPENDECTOMY  1963  . BACK SURGERY    . BREAST BIOPSY Right 04/27/2019   Affirm Bx "X" clip-INVASIVE MAMMARY CARCINOMA,  . BREAST CYST ASPIRATION Right 1988  . BREAST EXCISIONAL BIOPSY Right 1990   benign x 3  . BREAST LUMPECTOMY Right 05/08/2019  . BREAST LUMPECTOMY WITH SENTINEL LYMPH NODE BIOPSY Right 05/08/2019   Procedure: RIGHT BREAST LUMPECTOMY WITH SENTINEL LYMPH NODE BX AND WIRE LOC., LATEX ALLERGY;  Surgeon: Jules Husbands, MD;  Location: [facility];  Service: General;  Laterality: Right;  . BREAST SURGERY    . CATARACT EXTRACTION W/PHACO Right 03/31/2018   Procedure: CATARACT EXTRACTION PHACO AND INTRAOCULAR LENS PLACEMENT ([place]) RIGHT;  Surgeon: Leandrew Koyanagi, MD;  Location: [facility];  Service: Ophthalmology;  Laterality: Right;  Latex sensitivity  . CATARACT EXTRACTION W/PHACO Left 04/16/2018   Procedure: CATARACT EXTRACTION PHACO AND INTRAOCULAR LENS PLACEMENT ([place])  LEFT;  Surgeon: Leandrew Koyanagi, MD;  Location: [facility];  Service: Ophthalmology;  Laterality: Left;  . EYE SURGERY     Lasik  . FOOT SURGERY Bilateral   . HYSTEROTOMY    . LAMINECTOMY  2006  . ROTATOR CUFF REPAIR Bilateral   . TONSILLECTOMY     age 35  . TOTAL HIP ARTHROPLASTY Right 01/18/2015   Procedure: RIGHT TOTAL HIP ARTHROPLASTY ANTERIOR APPROACH;  Surgeon: Renette Butters, MD;  Location: [facility]  OR;  Service: Orthopedics;  Laterality: Right;  . TUBAL LIGATION      FAMILY HISTORY: family history includes Alcohol abuse in her brother and mother; Breast cancer (age of onset: 35) in her daughter; Cancer in her brother and daughter; Cancer (age of onset: 69) in her mother;  Cancer (age of onset: 33) in her father; Diabetes in her father; Migraines in her mother.  SOCIAL HISTORY:  reports that she quit smoking about 7 years ago. Her smoking use included cigarettes. She has a 3.00 pack-year smoking history. She has never used smokeless tobacco. She reports current alcohol use of about 5.0 standard drinks of alcohol per week. She reports that she does not use drugs.  ALLERGIES: Zostavax [zoster vaccine live], Naproxen, Penicillins, Zoloft [sertraline hcl], and Latex  MEDICATIONS:  Current Outpatient Medications  Medication Sig Dispense Refill  . ALPRAZolam (XANAX) 0.5 MG tablet TAKE 3 TABLETS BY MOUTH EVERY NIGHT AT BEDTIME 90 tablet 4  . calcium-vitamin D (OSCAL WITH D) 500-200 MG-UNIT tablet Take 1 tablet by mouth daily.    . Multiple Vitamins-Minerals (MULTIVITAMIN WITH MINERALS) tablet Take 1 tablet by mouth 2 (two) times daily.     Marland Kitchen omeprazole (PRILOSEC) 20 MG capsule TAKE 1 CAPSULE BY MOUTH ONCE DAILY 90 capsule 2  . pramipexole (MIRAPEX) 1 MG tablet TAKE 1 TABLET(1 MG) BY MOUTH AT BEDTIME (Patient taking differently: Take 1 mg by mouth at bedtime. ) 30 tablet 12  . cephALEXin (KEFLEX) 500 MG capsule Take 1 capsule (500 mg total) by mouth 4 (four) times daily for 10 days. 40 capsule 0  . clindamycin (CLEOCIN) 150 MG capsule Take 450 mg by mouth See admin instructions. Take before dental procedures    . HYDROcodone-acetaminophen (NORCO/VICODIN) 5-325 MG tablet Take 1-2 tablets by mouth every 6 (six) hours as needed for moderate pain. 20 tablet 0  . nitroGLYCERIN (NITROSTAT) 0.4 MG SL tablet Place 1 tablet (0.4 mg total) under the tongue every 5 (five) minutes as needed for chest pain. Go to ER if third tablet is necessary 30 tablet 5  . valACYclovir (VALTREX) 500 MG tablet Take 1 tablet (500 mg total) by mouth daily. Reported on 10/24/2015 30 tablet 11   No current facility-administered medications for this encounter.     ECOG PERFORMANCE STATUS:  0 -  Asymptomatic  REVIEW OF SYSTEMS: Patient denies any weight loss, fatigue, weakness, fever, chills or night sweats. Patient denies any loss of vision, blurred vision. Patient denies any ringing  of the ears or hearing loss. No irregular heartbeat. Patient denies heart murmur or history of fainting. Patient denies any chest pain or pain radiating to her upper extremities. Patient denies any shortness of breath, difficulty breathing at night, cough or hemoptysis. Patient denies any swelling in the lower legs. Patient denies any nausea vomiting, vomiting of blood, or coffee ground material in the vomitus. Patient denies any stomach pain. Patient states has had normal bowel movements no significant constipation or diarrhea. Patient denies any dysuria, hematuria or significant nocturia. Patient denies any problems walking, swelling in the joints or loss of balance. Patient denies any skin changes, loss of hair or loss of weight. Patient denies any excessive worrying or anxiety or significant depression. Patient denies any problems with insomnia. Patient denies excessive thirst, polyuria, polydipsia. Patient denies any swollen glands, patient denies easy bruising or easy bleeding. Patient denies any recent infections, allergies or URI. Patient "s visual fields have not changed significantly in recent time.   PHYSICAL EXAM: BP  122/71 (BP Location: Left Arm, Patient Position: Sitting)   Pulse 69   Temp (!) 95.8 F (35.4 C) (Tympanic)   Resp 18   Wt 173 lb (78.5 kg)   BMI 32.69 kg/m  Patient has a wide local excision in the lateral portion approximately 9 o'clock position which is healing well.  She also has a sentinel node biopsy scar which is healing well no dominant mass or nodularity is noted in either breast in 2 positions examined.  Well-developed well-nourished patient in NAD. HEENT reveals PERLA, EOMI, discs not visualized.  Oral cavity is clear. No oral mucosal lesions are identified. Neck is clear  without evidence of cervical or supraclavicular adenopathy. Lungs are clear to A&P. Cardiac examination is essentially unremarkable with regular rate and rhythm without murmur rub or thrill. Abdomen is benign with no organomegaly or masses noted. Motor sensory and DTR levels are equal and symmetric in the upper and lower extremities. Cranial nerves II through XII are grossly intact. Proprioception is intact. No peripheral adenopathy or edema is identified. No motor or sensory levels are noted. Crude visual fields are within normal range.  LABORATORY DATA: Pathology report reviewed    RADIOLOGY RESULTS: Mammogram ultrasound and MRI scan of breasts reviewed and compatible with above-stated findings   IMPRESSION: Stage Ia (T1c N0 M0) ER PR positive HER-2 negative invasive mammary carcinoma with lobular features status post wide local excision in 68 year old female  PLAN: At this time I have recommended whole breast radiation to her breast rather large and pendulous making hypofractionated course of treatment difficult.  Would plan on delivering 5040 cGy in 28 fractions.  Would also boost her scar another 1400 cGy using electron beam.  Risks and benefits of treatment including skin reaction fatigue alteration of blood counts possible occlusion of superficial lung all were discussed in detail with the patient.  We are awaiting Oncotype DX for determination whether she will need or recommend systemic chemotherapy.  I have personally set up and ordered CT simulation in about 2 weeks to allow for that test results to be forwarded.  Patient also will be a candidate for antiestrogen therapy after completion of radiation.  Patient comprehends my treatment plan well.  I would like to take this opportunity to thank you for allowing me to participate in the care of your patient.Noreene Filbert, MD

## 2019-05-22 ENCOUNTER — Other Ambulatory Visit: Payer: Self-pay

## 2019-05-22 NOTE — Progress Notes (Signed)
[facility]  [address], [address] [place], [place] [address] Phone: [phone]  Fax: [phone]   Clinic Day:  05/25/2019  Referring physician: Birdie Sons, MD  Chief Complaint: Elizabeth [name] is a 68 y.o. female with clinical stage IA right breast cancer who is seen s/p interval lumpectomy and discussion regarding direction of therapy.  HPI: The patient was last seen in the medical oncology clinic on 05/05/2019. At that time, she was doing well.  Breast MRI was reviewed.  We discussed plans for lumpectomy.  She underwent a lumpectomy and sentinel lymph node biopsy with Dr. Dahlia Byes on 05/08/2019. 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  Margins were uninvolved. One lymph node was negative for malignancy.   Oncotype DX testing revealed a score of 10, with a 3% chance of distant recurrence in 9 years with adjuvant endocrine therapy alone.  Absolute chemotherapy benefit was less than 1%.  She was seen by Dr. Baruch Gouty on 05/20/2019. Plan was for whole breast radiation, 5040 cGy in 28 fractions and boost to the scar with 1400 cGy.   During the interim, she is doing "not great." She reports several mistakes were made with her surgery, where the radioactive dye for sentinel lymph identification was not injected until she notified the physician. Her surgeon was switched the day of her surgery due to extenuating circumstances. She was not given EMLA cream to numb her breast prior to surgery. She was not given appropriate pain medication following surgery and notes her pain was a 9 out of 10. Her husband was not notified during the delays on her day of surgery. She feels that she was not well taken care of and is very upset.   She has been using ice packs following surgery. She denies any discharge but notes redness and warmth at the site of her surgery. She was put on a 10-day course of Keflex  by Dr. Dahlia Byes, and she is currently 5 days in. She is agreeable to begin tamoxifen therapy following radiation in addition to bisphosphonate therapy.   She recently fell through a floor in her new house and injured her ankle.   She will begin radiation on 06/02/2019. She will have a follow-up with Dr. Dahlia Byes on 06/08/2019.   Past Medical History:  Diagnosis Date   Anginal pain ([place])    Anxiety    Constipation due to opioid therapy    Degenerative joint disease (DJD) of hip    GERD (gastroesophageal reflux disease)    Osteopenia    Restless leg syndrome    takes Mirapex and xanax   Scoliosis    SUI (stress urinary incontinence, female)    Vaso-vagal reaction    after back surgery    Past Surgical History:  Procedure Laterality Date   APPENDECTOMY  1963   BACK SURGERY     BREAST BIOPSY Right 04/27/2019   Affirm Bx "X" clip-INVASIVE MAMMARY CARCINOMA,   BREAST CYST ASPIRATION Right 1988   BREAST EXCISIONAL BIOPSY Right 1990   benign x 3   BREAST LUMPECTOMY Right 05/08/2019   BREAST LUMPECTOMY WITH SENTINEL LYMPH NODE BIOPSY Right 05/08/2019   Procedure: RIGHT BREAST LUMPECTOMY WITH SENTINEL LYMPH NODE BX AND WIRE LOC., LATEX ALLERGY;  Surgeon: Jules Husbands, MD;  Location: [facility];  Service: General;  Laterality: Right;   BREAST SURGERY     CATARACT EXTRACTION W/PHACO Right 03/31/2018   Procedure: CATARACT EXTRACTION PHACO AND INTRAOCULAR  LENS PLACEMENT ([facility]) RIGHT;  Surgeon: Leandrew Koyanagi, MD;  Location: [place];  Service: Ophthalmology;  Laterality: Right;  Latex sensitivity   CATARACT EXTRACTION W/PHACO Left 04/16/2018   Procedure: CATARACT EXTRACTION PHACO AND INTRAOCULAR LENS PLACEMENT ([place])  LEFT;  Surgeon: Leandrew Koyanagi, MD;  Location: [place];  Service: Ophthalmology;  Laterality: Left;   EYE SURGERY     Lasik   FOOT SURGERY Bilateral    HYSTEROTOMY     LAMINECTOMY  2006   ROTATOR CUFF REPAIR Bilateral     TONSILLECTOMY     age 65   TOTAL HIP ARTHROPLASTY Right 01/18/2015   Procedure: RIGHT TOTAL HIP ARTHROPLASTY ANTERIOR APPROACH;  Surgeon: Renette Butters, MD;  Location: [place];  Service: Orthopedics;  Laterality: Right;   TUBAL LIGATION      Family History  Problem Relation Age of Onset   Alcohol abuse Mother    Cancer Mother 31       lung   Migraines Mother    Cancer Father 36       prostate   Diabetes Father    Alcohol abuse Brother    Cancer Brother        skin   Cancer Daughter        breast   Breast cancer Daughter 78   Bladder Cancer Neg Hx    Kidney cancer Neg Hx     Social History:  reports that she quit smoking about 7 years ago. Her smoking use included cigarettes. She has a 3.00 pack-year smoking history. She has never used smokeless tobacco. She reports current alcohol use of about 5.0 standard drinks of alcohol per week. She reports that she does not use drugs. She denies any known exposure to radiation or toxins. She is originally from [place], but lived in [place], [place] for many years and has been in [place] since 1999 after she got promoted working for [employer]. She is currently retired. She has several grandchildren and 10 great grandchildren.She lives in [place]. The patient is alone today.  Allergies:  Allergies  Allergen Reactions   Zostavax [Zoster Vaccine Live] Rash   Naproxen Hives   Penicillins Hives    Did it involve swelling of the face/tongue/throat, SOB, or low BP? No Did it involve sudden or severe rash/hives, skin peeling, or any reaction on the inside of your mouth or nose? Yes Did you need to seek medical attention at a hospital or doctor's office? Yes When did it last happen?14 or 15 If all above answers are NO, may proceed with cephalosporin use.    Zoloft [Sertraline Hcl] Hives   Latex Itching and Rash    Condoms    Current Medications: Current Outpatient Medications  Medication Sig Dispense  Refill   ALPRAZolam (XANAX) 0.5 MG tablet TAKE 3 TABLETS BY MOUTH EVERY NIGHT AT BEDTIME 90 tablet 4   calcium-vitamin D (OSCAL WITH D) 500-200 MG-UNIT tablet Take 1 tablet by mouth daily.     cephALEXin (KEFLEX) 500 MG capsule Take 1 capsule (500 mg total) by mouth 4 (four) times daily for 10 days. 40 capsule 0   clindamycin (CLEOCIN) 150 MG capsule Take 450 mg by mouth See admin instructions. Take before dental procedures     Multiple Vitamins-Minerals (MULTIVITAMIN WITH MINERALS) tablet Take 1 tablet by mouth 2 (two) times daily.      nitroGLYCERIN (NITROSTAT) 0.4 MG SL tablet Place 1 tablet (0.4 mg total) under the tongue every 5 (five) minutes as needed  for chest pain. Go to ER if third tablet is necessary 30 tablet 5   omeprazole (PRILOSEC) 20 MG capsule TAKE 1 CAPSULE BY MOUTH ONCE DAILY 90 capsule 2   pramipexole (MIRAPEX) 1 MG tablet TAKE 1 TABLET(1 MG) BY MOUTH AT BEDTIME (Patient taking differently: Take 1 mg by mouth at bedtime. ) 30 tablet 12   valACYclovir (VALTREX) 500 MG tablet Take 1 tablet (500 mg total) by mouth daily. Reported on 10/24/2015 30 tablet 11   HYDROcodone-acetaminophen (NORCO/VICODIN) 5-325 MG tablet Take 1-2 tablets by mouth every 6 (six) hours as needed for moderate pain. (Patient not taking: Reported on 05/25/2019) 20 tablet 0   No current facility-administered medications for this visit.     Review of Systems  Constitutional: Positive for weight loss (1lb). Negative for chills, diaphoresis, fever and malaise/fatigue.       "Not great."  HENT: Negative.  Negative for congestion, hearing loss, sinus pain and sore throat.   Eyes: Negative.  Negative for blurred vision.  Respiratory: Negative.  Negative for cough, shortness of breath and wheezing.   Cardiovascular: Negative.  Negative for chest pain, palpitations, orthopnea, leg swelling and PND.  Gastrointestinal: Negative.  Negative for abdominal pain, blood in stool, constipation, diarrhea, melena,  nausea and vomiting.  Genitourinary: Negative.  Negative for dysuria, frequency, hematuria and urgency.  Musculoskeletal: Positive for joint pain (left ankle, s/p fall). Negative for back pain and myalgias.       S/p right lumpectomy  Skin: Negative.  Negative for rash.  Neurological: Negative.  Negative for dizziness, tingling, sensory change, weakness and headaches.  Endo/Heme/Allergies: Negative.  Does not bruise/bleed easily.  Psychiatric/Behavioral: Negative.  Negative for depression, memory loss and substance abuse. The patient is not nervous/anxious and does not have insomnia.   All other systems reviewed and are negative.  Performance status (ECOG): 0  Vitals Blood pressure 121/73, pulse 67, temperature 98 F (36.7 C), resp. rate 20, weight 173 lb 4.5 oz (78.6 kg).   Physical Exam  Constitutional: She is oriented to person, place, and time. She appears well-developed and well-nourished. No distress.  HENT:  Head: Normocephalic and atraumatic.  Long brown hair. Wearing a mask.  Eyes: Conjunctivae and EOM are normal. No scleral icterus.  Brown eyes.   Pulmonary/Chest: Right breast exhibits no inverted nipple, no mass, no nipple discharge, no skin change (well healing lumpectomy and sentinel lymph node sites, slight warmth without erythema) and no tenderness. Left breast exhibits no inverted nipple, no mass, no nipple discharge, no skin change and no tenderness.  Neurological: She is alert and oriented to person, place, and time.  Skin: She is not diaphoretic.  Psychiatric: She has a normal mood and affect. Her behavior is normal. Judgment and thought content normal.  Nursing note and vitals reviewed.   No visits with results within 3 Day(s) from this visit.  Latest known visit with results is:  Admission on 05/08/2019, Discharged on 05/08/2019  Component Date Value Ref Range Status   SURGICAL PATHOLOGY 05/08/2019    Final                   Value:Surgical Pathology CASE:  [phone] PATIENT: Elizabeth [name] Surgical Pathology Report     SPECIMEN SUBMITTED: A. Breast, right; lumpectomy B. Sentinel node 1; bx  CLINICAL HISTORY: None provided  PRE-OPERATIVE DIAGNOSIS: C50.411 breast cancer  POST-OPERATIVE DIAGNOSIS: Same as pre-op     DIAGNOSIS: A. BREAST, RIGHT, LUMPECTOMY: - INVASIVE MAMMARY CARCINOMA WITH LOBULAR FEATURES. - ADJACENT  DUCTAL CARCINOMA IN SITU. - SEE CANCER SUMMARY BELOW.  B. SENTINEL LYMPH NODE 1, EXCISIONAL BIOPSY: - ONE LYMPH NODE, NEGATIVE FOR MALIGNANCY (0/1).  CANCER CASE SUMMARY: INVASIVE CARCINOMA OF THE BREAST Procedure: Lumpectomy Specimen Laterality: Right Tumor Size: 11 x 10 x 9 mm Histologic Type: Invasive mammary carcinoma with lobular features Histologic Grade (Nottingham Histologic Score)                      Glandular ([place])/Tubular Differentiation: 3                      Nuclear Pleomorphism: 2                      Mitotic Rate: 1                                               Overall Grade: 2 Ductal Carcinoma In Situ (DCIS): Present, cribriform Margins:                      Invasive Carcinoma Margins: Uninvolved by at least 1 cm                      DCIS Margins: Uninvolved by at least 1 cm  Regional Lymph Nodes: Uninvolved (0/1) Treatment Effect in the Breast: No known treatment Lymphovascular Invasion: Not identified Pathologic Stage Classification (pTNM, AJCC 8th Edition): pT1c pN0 (sn) TNM Descriptors: N/A  Breast Biomarker Testing Performed on Previous Biopsy: [id number] BREAST BIOMARKER TESTS Estrogen Receptor (ER) Status: POSITIVE Progesterone Receptor (PgR) Status: POSITIVE HER2 (by immunohistochemistry): NEGATIVE (Score 0)     GROSS DESCRIPTION: A.  Received: For intraoperative consultation:     Pathologic evaluation performed: 3:09 PM 05/08/2019     Diagnosis: Gross: At least 1 cm from all margins     Communicated to: Dr. Dahlia Byes by Dr. Dessie Coma     Tissue  submitted: No tissue submitted In the TranSpec compre                         ssion graded container. Labeled: " right breast lumpectomy" Formalin fixation: Tissue Removal Time 2:50 PM 05/08/2019, Tissue Time in Formalin 3:09 PM 05/08/2019, Tissue Time Out from Formalin 5:30 PM 05/11/2019.  Ischemic time 19 minutes. This specimen meets CAP guidelines for ischemic and formalin exposure time. Specimen laterality: Right Specimen procedure: Needle localized breast lumpectomy with sentinel lymph node biopsy. Excision (less than total mastectomy) Integrity: Intact Specimen X-ray image: The x-ray image shows metallic J-wire and metallic X-shaped biopsy clip which is located at the F9 position. Needle localization guide wire: One needle localized metallic J-wire inserting from anterior to posterior direction Orientation:  Long suture-lateral margin, Short suture-superior margin. An additional there 3 metallic tags which are designate cranial, deep, lateral margins. Skin: Not present            Weight: 26 g            Breast tissue size: 8.0 cm medial                          to lateral, 6.5 cm from superior to inferior, 4.5 cm from anterior to posterior  Posterior Fascia: Not present Inking pattern: Superior surface is inked in blue, inferior surface is inked in green, medial surface is inked in yellow, posterior/deep surface is inked in black, anterior surface is inked in red, lateral surface is inked in orange Sections: The specimen is sectioned from medial to lateral in 15 slices. There is a centrally located white spiculated firm tumor measuring 1.1 x 1.0 x 0.9 cm.  Adjacent to the tumor there is a 0.4 cm in greatest dimension previous biopsy cavity with X-shaped metallic biopsy clip.                 Distance to closest surgical margins: 3.2 cm from superior surgical margin, 1.4 cm from inferior surgical margin, 2.4 cm from medial surgical margin, 3.4 cm from lateral  surgical margin, 1.5 cm from the posterior/deep surgical margin, 1.1 cm from anterior surgical margin  Previous biopsy site: Identified with X-shape metallic bi                         opsy clip. Biopsy site history: [phone].  Right breast upper outer quadrant. Invasive mammary carcinoma, with ductal and lobular features. Referring Provider: Dellia Nims. Rubinas, MD. Remaining tissue: Remaining breast tissue showed mostly fatty lobulated soft breast parenchyma with minimal fibrotic changes (less than 2% of the entire tissue).  There is no other abnormality grossly identified Note: The specimen is photographed Additional Tissue: Not present Summary of Sections: 1-closest medial surgical margin 2-closest lateral surgical margin 3-closest superior surgical margin 4-closest inferior surgical margin 5-section with biopsy clip and anterior surgical margin 6-7-closest superior surgical margin 8-closest anterior and posterior surgical margins 9-11-closest posterior/deep surgical margin 12-14-tissue adjacent to the tumor  D. Received: In formalin Labeled: "Sentinel node biopsy #1" Specimen: Unoriented grossly unremarkable fibrofatty tissue fragment whi                         ch contains 1 blue dyed fatty lymph node Size: 1.0 x 0.6 x 0.4 cm Summary of Sections: 1 - one bisected lymph node, entirely submitted  Final Diagnosis performed by Betsy Pries, MD.   Electronically signed 05/13/2019 3:57:20PM The electronic signature indicates that the named Attending Pathologist has evaluated the specimen  Technical component performed at [facility], [address] [place], [place] [address] Lab: [phone] Dir: Rush Farmer, MD, MMM  Professional component performed at [facility], [facility], [place], [place], [place] [address] Lab: [phone] Dir: Dellia Nims. Rubinas, MD     Assessment:  Elizabeth [name] is a 68 y.o. female  with stage IA right breast  cancers/p lumpectomy and sentinel lymph node biopsy on 05/08/2019.  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  Margins were uninvolved. One lymph node was negative for malignancy.  Tumor was ER + (> 90%), PR- (> 90%), and Her1/neu -.  Pathologic stage was pT1c pN0.  CA27.29 was 20.0 on 05/01/2019.  Oncotype DX testing revealed a score of 10, with a 3% chance of distant recurrence in 9 years with adjuvant endocrine therapy alone.  Absolute chemotherapy benefit was < 1%.  Right diagnostic mammogramon 04/22/2019 showed persistent architectural distortion in the right breast upper outer quadrant, posterior depth, which represents postsurgical scarring(scar marker).There was a separate asymmetry in the upper right breast, posterior depth, measuring 6-7 mm. There was no evidence of right axillary adenopathy.Ultrasound revealed no suspicious masses or shadowing lesions.  Bilateral  breast MRI on 05/04/2019 revealed a 1.1 cm right breast malignancy in the lateral midportion of the right breast.  There were associated biopsy change.  The left breast was negative.  There was no evidence for adenopathy.  Clinical stage was T1cN0.  Bone density on 06/20/2010 revealed osteopeniawitha T score of -2.0 in the forearm radius. Bone density on 04/09/2019 revealed osteoporosiswith a T-score of -2.5 inthe forearm radius.  Symptomatically, she is frustrated by the events surrounding her surgery.  Exam reveals no evidence of infection.  Plan: 1.   Clinical stage IA right breast cancer             Review pathology and events surrounding recent lumpectomy.  Pathology confirmed a 1.1 cm grade II invasive mammary carcinoma.    Lymph nodes were not involved.  Pathologic stage is T1cN0. Review results of Oncotype DX testing which reveals a < 1% benefit of chemotherapy.   Review rationale for radiation and adjuvant endocrine  therapy.   Discuss tamoxifen versus aromatase inhibitors and potential side effects.   Discuss consideration of tamoxifen followed by an aromatase inhibitor after 2 to 3 years.                         Information on tamoxifen provided.  Review patient oncology consult.   Radiation begins on 06/02/2019.             Discuss plans to initiate endocrine therapy after completion of radiation. 2.Osteoporosis Continue calcium and vitamin D. Re-review consideration of bisphosphonate or RANK-ligand (Prolia).              Information on Prolia provided.    Preauth Prolia.  Review dental clearance prior to initiation of Prolia. 3.   Patient to call for follow-up appointment after radiation complete.  I discussed the assessment and treatment plan with the patient.  The patient was provided an opportunity to ask questions and all were answered.  The patient agreed with the plan and demonstrated an understanding of the instructions.  The patient was advised to call back if the symptoms worsen or if the condition fails to improve as anticipated.  I provided 28 minutes of face-to-face time during this this encounter and > 50% was spent counseling as documented under my assessment and plan.    Lequita Asal, MD, PhD    05/25/2019, 9:01 AM  I, Molly Dorshimer, am acting as Education administrator for [employer]. Mike Gip, MD, PhD.  I, Jacqualin Shirkey C. Mike Gip, MD, have reviewed the above documentation for accuracy and completeness, and I agree with the above.

## 2019-05-22 NOTE — Progress Notes (Signed)
S/p lumpectomy slnbx Path negative margins, lymph node negative, d/w the pt in detail She is doing well, no fevers or chills\  PE NAD Incisions healing well, no infection. Nipple some edema. No infection  A/p Doing well She may be forming a seroma that is more towards her nipple areola complex due to gravity We will give short course of a/bs  And see her back for wound check

## 2019-05-25 ENCOUNTER — Encounter: Payer: Self-pay | Admitting: Hematology and Oncology

## 2019-05-25 ENCOUNTER — Inpatient Hospital Stay: Payer: Medicare Other | Attending: Hematology and Oncology | Admitting: Hematology and Oncology

## 2019-05-25 ENCOUNTER — Other Ambulatory Visit: Payer: Self-pay

## 2019-05-25 VITALS — BP 121/73 | HR 67 | Temp 98.0°F | Resp 20 | Wt 173.3 lb

## 2019-05-25 DIAGNOSIS — C50411 Malignant neoplasm of upper-outer quadrant of right female breast: Secondary | ICD-10-CM

## 2019-05-25 DIAGNOSIS — Z170 Estrogen receptor positive status [ER+]: Secondary | ICD-10-CM | POA: Diagnosis not present

## 2019-05-25 DIAGNOSIS — M810 Age-related osteoporosis without current pathological fracture: Secondary | ICD-10-CM | POA: Diagnosis not present

## 2019-05-25 DIAGNOSIS — F17210 Nicotine dependence, cigarettes, uncomplicated: Secondary | ICD-10-CM | POA: Insufficient documentation

## 2019-05-25 NOTE — Progress Notes (Signed)
Patient here after having a lumpectomy, was put on Keflex, noted that the breast was warm, and has used ice off and on since surgery.

## 2019-05-25 NOTE — Patient Instructions (Signed)
Tamoxifen oral tablet What is this medicine? TAMOXIFEN (ta MOX i fen) blocks the effects of estrogen. It is commonly used to treat breast cancer. It is also used to decrease the chance of breast cancer coming back in women who have received treatment for the disease. It may also help prevent breast cancer in women who have a high risk of developing breast cancer. This medicine may be used for other purposes; ask your health care provider or pharmacist if you have questions. COMMON BRAND NAME(S): Nolvadex What should I tell my health care provider before I take this medicine? They need to know if you have any of these conditions:  blood clots  blood disease  cataracts or impaired eyesight  endometriosis  high calcium levels  high cholesterol  irregular menstrual cycles  liver disease  stroke  uterine fibroids  an unusual reaction to tamoxifen, other medicines, foods, dyes, or preservatives  pregnant or trying to get pregnant  breast-feeding How should I use this medicine? Take this medicine by mouth with a glass of water. Follow the directions on the prescription label. You can take it with or without food. Take your medicine at regular intervals. Do not take your medicine more often than directed. Do not stop taking except on your doctor's advice. A special MedGuide will be given to you by the pharmacist with each prescription and refill. Be sure to read this information carefully each time. Talk to your pediatrician regarding the use of this medicine in children. While this drug may be prescribed for selected conditions, precautions do apply. Overdosage: If you think you have taken too much of this medicine contact a poison control center or emergency room at once. NOTE: This medicine is only for you. Do not share this medicine with others. What if I miss a dose? If you miss a dose, take it as soon as you can. If it is almost time for your next dose, take only that dose. Do  not take double or extra doses. What may interact with this medicine? Do not take this medicine with any of the following medications:  cisapride  certain medicines for irregular heart beat like dronedarone, quinidine  certain medicines for fungal infection like fluconazole, posaconazole  pimozide  saquinavir  thioridazine This medicine may also interact with the following medications:  aminoglutethimide  anastrozole  bromocriptine  chemotherapy drugs  dofetilide  female hormones, like estrogens and birth control pills  letrozole  medroxyprogesterone  phenobarbital  rifampin  warfarin This list may not describe all possible interactions. Give your health care provider a list of all the medicines, herbs, non-prescription drugs, or dietary supplements you use. Also tell them if you smoke, drink alcohol, or use illegal drugs. Some items may interact with your medicine. What should I watch for while using this medicine? Visit your doctor or health care professional for regular checks on your progress. You will need regular pelvic exams, breast exams, and mammograms. If you are taking this medicine to reduce your risk of getting breast cancer, you should know that this medicine does not prevent all types of breast cancer. If breast cancer or other problems occur, there is no guarantee that it will be found at an early stage. Do not become pregnant while taking this medicine or for 2 months after stopping it. Women should inform their doctor if they wish to become pregnant or think they might be pregnant. There is a potential for serious side effects to an unborn child. Talk to your  health care professional or pharmacist for more information. Do not breast-feed an infant while taking this medicine or for 3 months after stopping it. This medicine may interfere with the ability to have a child. Talk with your doctor or health care professional if you are concerned about your  fertility. What side effects may I notice from receiving this medicine? Side effects that you should report to your doctor or health care professional as soon as possible:  allergic reactions like skin rash, itching or hives, swelling of the face, lips, or tongue  changes in vision  changes in your menstrual cycle  difficulty walking or talking  new breast lumps  numbness  pelvic pain or pressure  redness, blistering, peeling or loosening of the skin, including inside the mouth  signs and symptoms of a dangerous change in heartbeat or heart rhythm like chest pain, dizziness, fast or irregular heartbeat, palpitations, feeling faint or lightheaded, falls, breathing problems  sudden chest pain  swelling, pain or tenderness in your calf or leg  unusual bruising or bleeding  vaginal discharge that is bloody, brown, or rust  weakness  yellowing of the whites of the eyes or skin Side effects that usually do not require medical attention (report to your doctor or health care professional if they continue or are bothersome):  fatigue  hair loss, although uncommon and is usually mild  headache  hot flashes  impotence (in men)  nausea, vomiting (mild)  vaginal discharge (white or clear) This list may not describe all possible side effects. Call your doctor for medical advice about side effects. You may report side effects to FDA at 1-800-FDA-1088. Where should I keep my medicine? Keep out of the reach of children. Store at room temperature between 20 and 25 degrees C (68 and 77 degrees F). Protect from light. Keep container tightly closed. Throw away any unused medicine after the expiration date. NOTE: This sheet is a summary. It may not cover all possible information. If you have questions about this medicine, talk to your doctor, pharmacist, or health care provider.  2020 [employer] Standard (2018-09-16 11:15:31)  Denosumab injection What is this  medicine? DENOSUMAB (den oh sue mab) slows bone breakdown. Prolia is used to treat osteoporosis in women after menopause and in men, and in people who are taking corticosteroids for 6 months or more. Delton See is used to treat a high calcium level due to cancer and to prevent bone fractures and other bone problems caused by multiple myeloma or cancer bone metastases. Delton See is also used to treat giant cell tumor of the bone. This medicine may be used for other purposes; ask your health care provider or pharmacist if you have questions. COMMON BRAND NAME(S): Prolia, XGEVA What should I tell my health care provider before I take this medicine? They need to know if you have any of these conditions:  dental disease  having surgery or tooth extraction  infection  kidney disease  low levels of calcium or Vitamin D in the blood  malnutrition  on hemodialysis  skin conditions or sensitivity  thyroid or parathyroid disease  an unusual reaction to denosumab, other medicines, foods, dyes, or preservatives  pregnant or trying to get pregnant  breast-feeding How should I use this medicine? This medicine is for injection under the skin. It is given by a health care professional in a hospital or clinic setting. A special MedGuide will be given to you before each treatment. Be sure to read this information carefully each time.  For Prolia, talk to your pediatrician regarding the use of this medicine in children. Special care may be needed. For Delton See, talk to your pediatrician regarding the use of this medicine in children. While this drug may be prescribed for children as young as 13 years for selected conditions, precautions do apply. Overdosage: If you think you have taken too much of this medicine contact a poison control center or emergency room at once. NOTE: This medicine is only for you. Do not share this medicine with others. What if I miss a dose? It is important not to miss your dose. Call  your doctor or health care professional if you are unable to keep an appointment. What may interact with this medicine? Do not take this medicine with any of the following medications:  other medicines containing denosumab This medicine may also interact with the following medications:  medicines that lower your chance of fighting infection  steroid medicines like prednisone or cortisone This list may not describe all possible interactions. Give your health care provider a list of all the medicines, herbs, non-prescription drugs, or dietary supplements you use. Also tell them if you smoke, drink alcohol, or use illegal drugs. Some items may interact with your medicine. What should I watch for while using this medicine? Visit your doctor or health care professional for regular checks on your progress. Your doctor or health care professional may order blood tests and other tests to see how you are doing. Call your doctor or health care professional for advice if you get a fever, chills or sore throat, or other symptoms of a cold or flu. Do not treat yourself. This drug may decrease your body's ability to fight infection. Try to avoid being around people who are sick. You should make sure you get enough calcium and vitamin D while you are taking this medicine, unless your doctor tells you not to. Discuss the foods you eat and the vitamins you take with your health care professional. See your dentist regularly. Brush and floss your teeth as directed. Before you have any dental work done, tell your dentist you are receiving this medicine. Do not become pregnant while taking this medicine or for 5 months after stopping it. Talk with your doctor or health care professional about your birth control options while taking this medicine. Women should inform their doctor if they wish to become pregnant or think they might be pregnant. There is a potential for serious side effects to an unborn child. Talk to your  health care professional or pharmacist for more information. What side effects may I notice from receiving this medicine? Side effects that you should report to your doctor or health care professional as soon as possible:  allergic reactions like skin rash, itching or hives, swelling of the face, lips, or tongue  bone pain  breathing problems  dizziness  jaw pain, especially after dental work  redness, blistering, peeling of the skin  signs and symptoms of infection like fever or chills; cough; sore throat; pain or trouble passing urine  signs of low calcium like fast heartbeat, muscle cramps or muscle pain; pain, tingling, numbness in the hands or feet; seizures  unusual bleeding or bruising  unusually weak or tired Side effects that usually do not require medical attention (report to your doctor or health care professional if they continue or are bothersome):  constipation  diarrhea  headache  joint pain  loss of appetite  muscle pain  runny nose  tiredness  upset  stomach This list may not describe all possible side effects. Call your doctor for medical advice about side effects. You may report side effects to FDA at 1-800-FDA-1088. Where should I keep my medicine? This medicine is only given in a clinic, doctor's office, or other health care setting and will not be stored at home. NOTE: This sheet is a summary. It may not cover all possible information. If you have questions about this medicine, talk to your doctor, pharmacist, or health care provider.  2020 [employer]/Gold Standard (2018-01-31 16:10:44)

## 2019-06-01 ENCOUNTER — Other Ambulatory Visit: Payer: Self-pay

## 2019-06-02 ENCOUNTER — Ambulatory Visit
Admission: RE | Admit: 2019-06-02 | Discharge: 2019-06-02 | Disposition: A | Discharge status: Home / Self Care | Payer: Federal, State, Local not specified - PPO | Source: Ambulatory Visit | Attending: Radiation Oncology | Admitting: Radiation Oncology

## 2019-06-02 ENCOUNTER — Other Ambulatory Visit: Payer: Self-pay

## 2019-06-02 DIAGNOSIS — C50411 Malignant neoplasm of upper-outer quadrant of right female breast: Secondary | ICD-10-CM | POA: Diagnosis not present

## 2019-06-03 DIAGNOSIS — C50411 Malignant neoplasm of upper-outer quadrant of right female breast: Secondary | ICD-10-CM | POA: Diagnosis not present

## 2019-06-05 ENCOUNTER — Other Ambulatory Visit: Payer: Self-pay | Admitting: *Deleted

## 2019-06-05 DIAGNOSIS — Z170 Estrogen receptor positive status [ER+]: Secondary | ICD-10-CM

## 2019-06-05 DIAGNOSIS — C50411 Malignant neoplasm of upper-outer quadrant of right female breast: Secondary | ICD-10-CM

## 2019-06-08 ENCOUNTER — Encounter: Payer: Self-pay | Admitting: Surgery

## 2019-06-08 ENCOUNTER — Other Ambulatory Visit: Payer: Self-pay

## 2019-06-08 ENCOUNTER — Ambulatory Visit (INDEPENDENT_AMBULATORY_CARE_PROVIDER_SITE_OTHER): Payer: Federal, State, Local not specified - PPO | Admitting: Surgery

## 2019-06-08 VITALS — BP 136/83 | HR 68 | Temp 97.5°F | Ht 63.0 in | Wt 175.0 lb

## 2019-06-08 DIAGNOSIS — Z09 Encounter for follow-up examination after completed treatment for conditions other than malignant neoplasm: Secondary | ICD-10-CM

## 2019-06-08 NOTE — Patient Instructions (Addendum)
Patient to return in two months.

## 2019-06-09 ENCOUNTER — Ambulatory Visit
Admission: RE | Admit: 2019-06-09 | Discharge: 2019-06-09 | Disposition: A | Discharge status: Home / Self Care | Payer: Federal, State, Local not specified - PPO | Source: Ambulatory Visit | Attending: Radiation Oncology | Admitting: Radiation Oncology

## 2019-06-09 ENCOUNTER — Encounter: Payer: Self-pay | Admitting: Hematology and Oncology

## 2019-06-09 ENCOUNTER — Other Ambulatory Visit: Payer: Self-pay

## 2019-06-09 DIAGNOSIS — Z87891 Personal history of nicotine dependence: Secondary | ICD-10-CM | POA: Diagnosis not present

## 2019-06-09 DIAGNOSIS — M8580 Other specified disorders of bone density and structure, unspecified site: Secondary | ICD-10-CM | POA: Diagnosis not present

## 2019-06-09 DIAGNOSIS — G2581 Restless legs syndrome: Secondary | ICD-10-CM | POA: Insufficient documentation

## 2019-06-09 DIAGNOSIS — Z79899 Other long term (current) drug therapy: Secondary | ICD-10-CM | POA: Diagnosis not present

## 2019-06-09 DIAGNOSIS — Z170 Estrogen receptor positive status [ER+]: Secondary | ICD-10-CM | POA: Diagnosis not present

## 2019-06-09 DIAGNOSIS — Z809 Family history of malignant neoplasm, unspecified: Secondary | ICD-10-CM | POA: Insufficient documentation

## 2019-06-09 DIAGNOSIS — C50411 Malignant neoplasm of upper-outer quadrant of right female breast: Secondary | ICD-10-CM | POA: Diagnosis not present

## 2019-06-09 DIAGNOSIS — F419 Anxiety disorder, unspecified: Secondary | ICD-10-CM | POA: Diagnosis not present

## 2019-06-09 DIAGNOSIS — Z803 Family history of malignant neoplasm of breast: Secondary | ICD-10-CM | POA: Insufficient documentation

## 2019-06-10 ENCOUNTER — Other Ambulatory Visit: Payer: Self-pay

## 2019-06-10 ENCOUNTER — Ambulatory Visit
Admission: RE | Admit: 2019-06-10 | Discharge: 2019-06-10 | Disposition: A | Discharge status: Home / Self Care | Payer: Federal, State, Local not specified - PPO | Source: Ambulatory Visit | Attending: Radiation Oncology | Admitting: Radiation Oncology

## 2019-06-10 DIAGNOSIS — C50411 Malignant neoplasm of upper-outer quadrant of right female breast: Secondary | ICD-10-CM | POA: Diagnosis not present

## 2019-06-10 NOTE — Progress Notes (Signed)
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 No lymphedema, mild depending edema on the right breast w some erythema  A/P Doing very well Changes of edema likely post operative and some venous congestion. No evidence of lymphedema, abscess. F/U 2 months

## 2019-06-11 ENCOUNTER — Ambulatory Visit
Admission: RE | Admit: 2019-06-11 | Discharge: 2019-06-11 | Disposition: A | Discharge status: Home / Self Care | Payer: Federal, State, Local not specified - PPO | Source: Ambulatory Visit | Attending: Radiation Oncology | Admitting: Radiation Oncology

## 2019-06-11 ENCOUNTER — Other Ambulatory Visit: Payer: Self-pay

## 2019-06-11 DIAGNOSIS — C50411 Malignant neoplasm of upper-outer quadrant of right female breast: Secondary | ICD-10-CM | POA: Diagnosis not present

## 2019-06-12 ENCOUNTER — Other Ambulatory Visit: Payer: Self-pay

## 2019-06-12 ENCOUNTER — Ambulatory Visit
Admission: RE | Admit: 2019-06-12 | Discharge: 2019-06-12 | Disposition: A | Discharge status: Home / Self Care | Payer: Federal, State, Local not specified - PPO | Source: Ambulatory Visit | Attending: Radiation Oncology | Admitting: Radiation Oncology

## 2019-06-12 DIAGNOSIS — C50411 Malignant neoplasm of upper-outer quadrant of right female breast: Secondary | ICD-10-CM | POA: Diagnosis not present

## 2019-06-16 ENCOUNTER — Other Ambulatory Visit: Payer: Self-pay

## 2019-06-16 ENCOUNTER — Ambulatory Visit
Admission: RE | Admit: 2019-06-16 | Discharge: 2019-06-16 | Disposition: A | Discharge status: Home / Self Care | Payer: Federal, State, Local not specified - PPO | Source: Ambulatory Visit | Attending: Radiation Oncology | Admitting: Radiation Oncology

## 2019-06-16 DIAGNOSIS — C50411 Malignant neoplasm of upper-outer quadrant of right female breast: Secondary | ICD-10-CM | POA: Diagnosis not present

## 2019-06-17 ENCOUNTER — Ambulatory Visit
Admission: RE | Admit: 2019-06-17 | Discharge: 2019-06-17 | Disposition: A | Discharge status: Home / Self Care | Payer: Federal, State, Local not specified - PPO | Source: Ambulatory Visit | Attending: Radiation Oncology | Admitting: Radiation Oncology

## 2019-06-17 ENCOUNTER — Other Ambulatory Visit: Payer: Self-pay

## 2019-06-17 DIAGNOSIS — C50411 Malignant neoplasm of upper-outer quadrant of right female breast: Secondary | ICD-10-CM | POA: Diagnosis not present

## 2019-06-18 ENCOUNTER — Ambulatory Visit
Admission: RE | Admit: 2019-06-18 | Discharge: 2019-06-18 | Disposition: A | Discharge status: Home / Self Care | Payer: Federal, State, Local not specified - PPO | Source: Ambulatory Visit | Attending: Radiation Oncology | Admitting: Radiation Oncology

## 2019-06-18 ENCOUNTER — Other Ambulatory Visit: Payer: Self-pay

## 2019-06-18 DIAGNOSIS — C50411 Malignant neoplasm of upper-outer quadrant of right female breast: Secondary | ICD-10-CM | POA: Diagnosis not present

## 2019-06-19 ENCOUNTER — Ambulatory Visit
Admission: RE | Admit: 2019-06-19 | Discharge: 2019-06-19 | Disposition: A | Discharge status: Home / Self Care | Payer: Federal, State, Local not specified - PPO | Source: Ambulatory Visit | Attending: Radiation Oncology | Admitting: Radiation Oncology

## 2019-06-19 ENCOUNTER — Other Ambulatory Visit: Payer: Self-pay

## 2019-06-19 DIAGNOSIS — C50411 Malignant neoplasm of upper-outer quadrant of right female breast: Secondary | ICD-10-CM | POA: Diagnosis not present

## 2019-06-22 ENCOUNTER — Ambulatory Visit
Admission: RE | Admit: 2019-06-22 | Discharge: 2019-06-22 | Disposition: A | Discharge status: Home / Self Care | Payer: Federal, State, Local not specified - PPO | Source: Ambulatory Visit | Attending: Radiation Oncology | Admitting: Radiation Oncology

## 2019-06-22 ENCOUNTER — Other Ambulatory Visit: Payer: Self-pay

## 2019-06-22 DIAGNOSIS — C50411 Malignant neoplasm of upper-outer quadrant of right female breast: Secondary | ICD-10-CM | POA: Diagnosis not present

## 2019-06-23 ENCOUNTER — Ambulatory Visit (INDEPENDENT_AMBULATORY_CARE_PROVIDER_SITE_OTHER): Payer: Federal, State, Local not specified - PPO

## 2019-06-23 ENCOUNTER — Ambulatory Visit
Admission: RE | Admit: 2019-06-23 | Discharge: 2019-06-23 | Disposition: A | Discharge status: Home / Self Care | Payer: Federal, State, Local not specified - PPO | Source: Ambulatory Visit | Attending: Radiation Oncology | Admitting: Radiation Oncology

## 2019-06-23 ENCOUNTER — Other Ambulatory Visit: Payer: Self-pay

## 2019-06-23 DIAGNOSIS — Z23 Encounter for immunization: Secondary | ICD-10-CM

## 2019-06-23 DIAGNOSIS — C50411 Malignant neoplasm of upper-outer quadrant of right female breast: Secondary | ICD-10-CM | POA: Diagnosis not present

## 2019-06-23 NOTE — Progress Notes (Signed)
fl

## 2019-06-24 ENCOUNTER — Ambulatory Visit
Admission: RE | Admit: 2019-06-24 | Discharge: 2019-06-24 | Disposition: A | Discharge status: Home / Self Care | Payer: Federal, State, Local not specified - PPO | Source: Ambulatory Visit | Attending: Radiation Oncology | Admitting: Radiation Oncology

## 2019-06-24 ENCOUNTER — Other Ambulatory Visit: Payer: Self-pay

## 2019-06-24 DIAGNOSIS — C50411 Malignant neoplasm of upper-outer quadrant of right female breast: Secondary | ICD-10-CM | POA: Diagnosis not present

## 2019-06-25 ENCOUNTER — Other Ambulatory Visit: Payer: Self-pay

## 2019-06-25 ENCOUNTER — Ambulatory Visit
Admission: RE | Admit: 2019-06-25 | Discharge: 2019-06-25 | Disposition: A | Discharge status: Home / Self Care | Payer: Federal, State, Local not specified - PPO | Source: Ambulatory Visit | Attending: Radiation Oncology | Admitting: Radiation Oncology

## 2019-06-25 ENCOUNTER — Inpatient Hospital Stay: Payer: Federal, State, Local not specified - PPO | Attending: Hematology and Oncology

## 2019-06-25 DIAGNOSIS — Z170 Estrogen receptor positive status [ER+]: Secondary | ICD-10-CM

## 2019-06-25 DIAGNOSIS — C50411 Malignant neoplasm of upper-outer quadrant of right female breast: Secondary | ICD-10-CM | POA: Insufficient documentation

## 2019-06-25 DIAGNOSIS — M810 Age-related osteoporosis without current pathological fracture: Secondary | ICD-10-CM | POA: Diagnosis not present

## 2019-06-25 LAB — CBC
HCT: 41 % (ref 36.0–46.0)
Hemoglobin: 13.2 g/dL (ref 12.0–15.0)
MCH: 30.6 pg (ref 26.0–34.0)
MCHC: 32.2 g/dL (ref 30.0–36.0)
MCV: 95.1 fL (ref 80.0–100.0)
Platelets: 258 10*3/uL (ref 150–400)
RBC: 4.31 MIL/uL (ref 3.87–5.11)
RDW: 13.2 % (ref 11.5–15.5)
WBC: 7.5 10*3/uL (ref 4.0–10.5)
nRBC: 0 % (ref 0.0–0.2)

## 2019-06-26 ENCOUNTER — Other Ambulatory Visit: Payer: Self-pay

## 2019-06-26 ENCOUNTER — Ambulatory Visit
Admission: RE | Admit: 2019-06-26 | Discharge: 2019-06-26 | Disposition: A | Discharge status: Home / Self Care | Payer: Federal, State, Local not specified - PPO | Source: Ambulatory Visit | Attending: Radiation Oncology | Admitting: Radiation Oncology

## 2019-06-26 DIAGNOSIS — C50411 Malignant neoplasm of upper-outer quadrant of right female breast: Secondary | ICD-10-CM | POA: Diagnosis not present

## 2019-06-29 ENCOUNTER — Other Ambulatory Visit: Payer: Self-pay

## 2019-06-29 ENCOUNTER — Ambulatory Visit
Admission: RE | Admit: 2019-06-29 | Discharge: 2019-06-29 | Disposition: A | Discharge status: Home / Self Care | Payer: Federal, State, Local not specified - PPO | Source: Ambulatory Visit | Attending: Radiation Oncology | Admitting: Radiation Oncology

## 2019-06-29 DIAGNOSIS — C50411 Malignant neoplasm of upper-outer quadrant of right female breast: Secondary | ICD-10-CM | POA: Diagnosis not present

## 2019-06-30 ENCOUNTER — Other Ambulatory Visit: Payer: Self-pay

## 2019-06-30 ENCOUNTER — Ambulatory Visit
Admission: RE | Admit: 2019-06-30 | Discharge: 2019-06-30 | Disposition: A | Discharge status: Home / Self Care | Payer: Federal, State, Local not specified - PPO | Source: Ambulatory Visit | Attending: Radiation Oncology | Admitting: Radiation Oncology

## 2019-06-30 DIAGNOSIS — C50411 Malignant neoplasm of upper-outer quadrant of right female breast: Secondary | ICD-10-CM | POA: Diagnosis not present

## 2019-07-01 ENCOUNTER — Other Ambulatory Visit: Payer: Self-pay

## 2019-07-01 ENCOUNTER — Ambulatory Visit
Admission: RE | Admit: 2019-07-01 | Discharge: 2019-07-01 | Disposition: A | Discharge status: Home / Self Care | Payer: Federal, State, Local not specified - PPO | Source: Ambulatory Visit | Attending: Radiation Oncology | Admitting: Radiation Oncology

## 2019-07-01 DIAGNOSIS — C50411 Malignant neoplasm of upper-outer quadrant of right female breast: Secondary | ICD-10-CM | POA: Diagnosis not present

## 2019-07-02 ENCOUNTER — Ambulatory Visit
Admission: RE | Admit: 2019-07-02 | Discharge: 2019-07-02 | Disposition: A | Discharge status: Home / Self Care | Payer: Federal, State, Local not specified - PPO | Source: Ambulatory Visit | Attending: Radiation Oncology | Admitting: Radiation Oncology

## 2019-07-02 ENCOUNTER — Other Ambulatory Visit: Payer: Self-pay

## 2019-07-02 DIAGNOSIS — C50411 Malignant neoplasm of upper-outer quadrant of right female breast: Secondary | ICD-10-CM | POA: Diagnosis not present

## 2019-07-03 ENCOUNTER — Other Ambulatory Visit: Payer: Self-pay

## 2019-07-03 ENCOUNTER — Ambulatory Visit
Admission: RE | Admit: 2019-07-03 | Discharge: 2019-07-03 | Disposition: A | Discharge status: Home / Self Care | Payer: Federal, State, Local not specified - PPO | Source: Ambulatory Visit | Attending: Radiation Oncology | Admitting: Radiation Oncology

## 2019-07-03 DIAGNOSIS — C50411 Malignant neoplasm of upper-outer quadrant of right female breast: Secondary | ICD-10-CM | POA: Diagnosis not present

## 2019-07-06 ENCOUNTER — Other Ambulatory Visit: Payer: Self-pay

## 2019-07-06 ENCOUNTER — Ambulatory Visit
Admission: RE | Admit: 2019-07-06 | Discharge: 2019-07-06 | Disposition: A | Discharge status: Home / Self Care | Payer: Federal, State, Local not specified - PPO | Source: Ambulatory Visit | Attending: Radiation Oncology | Admitting: Radiation Oncology

## 2019-07-06 DIAGNOSIS — C50411 Malignant neoplasm of upper-outer quadrant of right female breast: Secondary | ICD-10-CM | POA: Diagnosis not present

## 2019-07-07 ENCOUNTER — Other Ambulatory Visit: Payer: Self-pay

## 2019-07-07 ENCOUNTER — Ambulatory Visit: Payer: Federal, State, Local not specified - PPO

## 2019-07-07 ENCOUNTER — Ambulatory Visit
Admission: RE | Admit: 2019-07-07 | Discharge: 2019-07-07 | Disposition: A | Discharge status: Home / Self Care | Payer: Federal, State, Local not specified - PPO | Source: Ambulatory Visit | Attending: Radiation Oncology | Admitting: Radiation Oncology

## 2019-07-07 DIAGNOSIS — C50411 Malignant neoplasm of upper-outer quadrant of right female breast: Secondary | ICD-10-CM | POA: Diagnosis not present

## 2019-07-08 ENCOUNTER — Ambulatory Visit
Admission: RE | Admit: 2019-07-08 | Discharge: 2019-07-08 | Disposition: A | Discharge status: Home / Self Care | Payer: Federal, State, Local not specified - PPO | Source: Ambulatory Visit | Attending: Radiation Oncology | Admitting: Radiation Oncology

## 2019-07-08 ENCOUNTER — Other Ambulatory Visit: Payer: Self-pay

## 2019-07-08 DIAGNOSIS — C50411 Malignant neoplasm of upper-outer quadrant of right female breast: Secondary | ICD-10-CM | POA: Diagnosis not present

## 2019-07-09 ENCOUNTER — Other Ambulatory Visit: Payer: Self-pay

## 2019-07-09 ENCOUNTER — Ambulatory Visit
Admission: RE | Admit: 2019-07-09 | Discharge: 2019-07-09 | Disposition: A | Discharge status: Home / Self Care | Payer: Federal, State, Local not specified - PPO | Source: Ambulatory Visit | Attending: Radiation Oncology | Admitting: Radiation Oncology

## 2019-07-09 ENCOUNTER — Inpatient Hospital Stay: Payer: Federal, State, Local not specified - PPO | Attending: Hematology and Oncology

## 2019-07-09 DIAGNOSIS — Z79899 Other long term (current) drug therapy: Secondary | ICD-10-CM | POA: Diagnosis not present

## 2019-07-09 DIAGNOSIS — Z170 Estrogen receptor positive status [ER+]: Secondary | ICD-10-CM | POA: Insufficient documentation

## 2019-07-09 DIAGNOSIS — Z923 Personal history of irradiation: Secondary | ICD-10-CM | POA: Diagnosis not present

## 2019-07-09 DIAGNOSIS — G2581 Restless legs syndrome: Secondary | ICD-10-CM | POA: Insufficient documentation

## 2019-07-09 DIAGNOSIS — Z809 Family history of malignant neoplasm, unspecified: Secondary | ICD-10-CM | POA: Diagnosis not present

## 2019-07-09 DIAGNOSIS — F419 Anxiety disorder, unspecified: Secondary | ICD-10-CM | POA: Insufficient documentation

## 2019-07-09 DIAGNOSIS — Z87891 Personal history of nicotine dependence: Secondary | ICD-10-CM | POA: Diagnosis not present

## 2019-07-09 DIAGNOSIS — C50411 Malignant neoplasm of upper-outer quadrant of right female breast: Secondary | ICD-10-CM | POA: Diagnosis not present

## 2019-07-09 DIAGNOSIS — M8580 Other specified disorders of bone density and structure, unspecified site: Secondary | ICD-10-CM | POA: Diagnosis not present

## 2019-07-09 DIAGNOSIS — Z803 Family history of malignant neoplasm of breast: Secondary | ICD-10-CM | POA: Insufficient documentation

## 2019-07-09 DIAGNOSIS — M810 Age-related osteoporosis without current pathological fracture: Secondary | ICD-10-CM | POA: Insufficient documentation

## 2019-07-09 LAB — CBC
HCT: 39.9 % (ref 36.0–46.0)
Hemoglobin: 13.1 g/dL (ref 12.0–15.0)
MCH: 31 pg (ref 26.0–34.0)
MCHC: 32.8 g/dL (ref 30.0–36.0)
MCV: 94.3 fL (ref 80.0–100.0)
Platelets: 256 10*3/uL (ref 150–400)
RBC: 4.23 MIL/uL (ref 3.87–5.11)
RDW: 13.4 % (ref 11.5–15.5)
WBC: 7.1 10*3/uL (ref 4.0–10.5)
nRBC: 0 % (ref 0.0–0.2)

## 2019-07-10 ENCOUNTER — Ambulatory Visit
Admission: RE | Admit: 2019-07-10 | Discharge: 2019-07-10 | Disposition: A | Discharge status: Home / Self Care | Payer: Federal, State, Local not specified - PPO | Source: Ambulatory Visit | Attending: Radiation Oncology | Admitting: Radiation Oncology

## 2019-07-10 ENCOUNTER — Other Ambulatory Visit: Payer: Self-pay

## 2019-07-10 DIAGNOSIS — C50411 Malignant neoplasm of upper-outer quadrant of right female breast: Secondary | ICD-10-CM | POA: Diagnosis not present

## 2019-07-13 ENCOUNTER — Other Ambulatory Visit: Payer: Self-pay

## 2019-07-13 ENCOUNTER — Ambulatory Visit
Admission: RE | Admit: 2019-07-13 | Discharge: 2019-07-13 | Disposition: A | Discharge status: Home / Self Care | Payer: Federal, State, Local not specified - PPO | Source: Ambulatory Visit | Attending: Radiation Oncology | Admitting: Radiation Oncology

## 2019-07-13 DIAGNOSIS — C50411 Malignant neoplasm of upper-outer quadrant of right female breast: Secondary | ICD-10-CM | POA: Diagnosis not present

## 2019-07-14 ENCOUNTER — Ambulatory Visit
Admission: RE | Admit: 2019-07-14 | Discharge: 2019-07-14 | Disposition: A | Discharge status: Home / Self Care | Payer: Federal, State, Local not specified - PPO | Source: Ambulatory Visit | Attending: Radiation Oncology | Admitting: Radiation Oncology

## 2019-07-14 ENCOUNTER — Other Ambulatory Visit: Payer: Self-pay

## 2019-07-14 DIAGNOSIS — C50411 Malignant neoplasm of upper-outer quadrant of right female breast: Secondary | ICD-10-CM | POA: Diagnosis not present

## 2019-07-15 ENCOUNTER — Ambulatory Visit
Admission: RE | Admit: 2019-07-15 | Discharge: 2019-07-15 | Disposition: A | Discharge status: Home / Self Care | Payer: Federal, State, Local not specified - PPO | Source: Ambulatory Visit | Attending: Radiation Oncology | Admitting: Radiation Oncology

## 2019-07-15 ENCOUNTER — Other Ambulatory Visit: Payer: Self-pay

## 2019-07-15 DIAGNOSIS — C50411 Malignant neoplasm of upper-outer quadrant of right female breast: Secondary | ICD-10-CM | POA: Diagnosis not present

## 2019-07-16 ENCOUNTER — Ambulatory Visit
Admission: RE | Admit: 2019-07-16 | Discharge: 2019-07-16 | Disposition: A | Discharge status: Home / Self Care | Payer: Federal, State, Local not specified - PPO | Source: Ambulatory Visit | Attending: Radiation Oncology | Admitting: Radiation Oncology

## 2019-07-16 ENCOUNTER — Other Ambulatory Visit: Payer: Self-pay

## 2019-07-16 DIAGNOSIS — C50411 Malignant neoplasm of upper-outer quadrant of right female breast: Secondary | ICD-10-CM | POA: Diagnosis not present

## 2019-07-17 ENCOUNTER — Ambulatory Visit
Admission: RE | Admit: 2019-07-17 | Discharge: 2019-07-17 | Disposition: A | Discharge status: Home / Self Care | Payer: Federal, State, Local not specified - PPO | Source: Ambulatory Visit | Attending: Radiation Oncology | Admitting: Radiation Oncology

## 2019-07-17 ENCOUNTER — Other Ambulatory Visit: Payer: Self-pay

## 2019-07-17 DIAGNOSIS — C50411 Malignant neoplasm of upper-outer quadrant of right female breast: Secondary | ICD-10-CM | POA: Diagnosis not present

## 2019-07-20 ENCOUNTER — Other Ambulatory Visit: Payer: Self-pay

## 2019-07-20 ENCOUNTER — Ambulatory Visit
Admission: RE | Admit: 2019-07-20 | Discharge: 2019-07-20 | Disposition: A | Discharge status: Home / Self Care | Payer: Federal, State, Local not specified - PPO | Source: Ambulatory Visit | Attending: Radiation Oncology | Admitting: Radiation Oncology

## 2019-07-20 DIAGNOSIS — C50411 Malignant neoplasm of upper-outer quadrant of right female breast: Secondary | ICD-10-CM | POA: Diagnosis not present

## 2019-07-21 ENCOUNTER — Other Ambulatory Visit: Payer: Self-pay

## 2019-07-21 ENCOUNTER — Ambulatory Visit
Admission: RE | Admit: 2019-07-21 | Discharge: 2019-07-21 | Disposition: A | Discharge status: Home / Self Care | Payer: Federal, State, Local not specified - PPO | Source: Ambulatory Visit | Attending: Radiation Oncology | Admitting: Radiation Oncology

## 2019-07-21 DIAGNOSIS — C50411 Malignant neoplasm of upper-outer quadrant of right female breast: Secondary | ICD-10-CM | POA: Diagnosis not present

## 2019-07-22 ENCOUNTER — Other Ambulatory Visit: Payer: Self-pay

## 2019-07-22 ENCOUNTER — Ambulatory Visit
Admission: RE | Admit: 2019-07-22 | Discharge: 2019-07-22 | Disposition: A | Discharge status: Home / Self Care | Payer: Federal, State, Local not specified - PPO | Source: Ambulatory Visit | Attending: Radiation Oncology | Admitting: Radiation Oncology

## 2019-07-22 DIAGNOSIS — C50411 Malignant neoplasm of upper-outer quadrant of right female breast: Secondary | ICD-10-CM | POA: Diagnosis not present

## 2019-07-23 ENCOUNTER — Other Ambulatory Visit: Payer: Self-pay

## 2019-07-23 ENCOUNTER — Ambulatory Visit
Admission: RE | Admit: 2019-07-23 | Discharge: 2019-07-23 | Disposition: A | Discharge status: Home / Self Care | Payer: Federal, State, Local not specified - PPO | Source: Ambulatory Visit | Attending: Radiation Oncology | Admitting: Radiation Oncology

## 2019-07-23 ENCOUNTER — Inpatient Hospital Stay: Payer: Federal, State, Local not specified - PPO

## 2019-07-23 DIAGNOSIS — Z170 Estrogen receptor positive status [ER+]: Secondary | ICD-10-CM

## 2019-07-23 DIAGNOSIS — C50411 Malignant neoplasm of upper-outer quadrant of right female breast: Secondary | ICD-10-CM

## 2019-07-23 LAB — CBC
HCT: 42.9 % (ref 36.0–46.0)
Hemoglobin: 13.7 g/dL (ref 12.0–15.0)
MCH: 30.6 pg (ref 26.0–34.0)
MCHC: 31.9 g/dL (ref 30.0–36.0)
MCV: 96 fL (ref 80.0–100.0)
Platelets: 230 10*3/uL (ref 150–400)
RBC: 4.47 MIL/uL (ref 3.87–5.11)
RDW: 13.5 % (ref 11.5–15.5)
WBC: 6.1 10*3/uL (ref 4.0–10.5)
nRBC: 0 % (ref 0.0–0.2)

## 2019-07-24 ENCOUNTER — Encounter: Payer: Self-pay | Admitting: Hematology and Oncology

## 2019-07-24 ENCOUNTER — Other Ambulatory Visit: Payer: Self-pay

## 2019-07-24 ENCOUNTER — Ambulatory Visit
Admission: RE | Admit: 2019-07-24 | Discharge: 2019-07-24 | Disposition: A | Discharge status: Home / Self Care | Payer: Federal, State, Local not specified - PPO | Source: Ambulatory Visit | Attending: Radiation Oncology | Admitting: Radiation Oncology

## 2019-07-24 DIAGNOSIS — C50411 Malignant neoplasm of upper-outer quadrant of right female breast: Secondary | ICD-10-CM | POA: Diagnosis not present

## 2019-07-27 ENCOUNTER — Other Ambulatory Visit: Payer: Self-pay

## 2019-07-27 ENCOUNTER — Ambulatory Visit
Admission: RE | Admit: 2019-07-27 | Discharge: 2019-07-27 | Disposition: A | Discharge status: Home / Self Care | Payer: Federal, State, Local not specified - PPO | Source: Ambulatory Visit | Attending: Radiation Oncology | Admitting: Radiation Oncology

## 2019-07-27 DIAGNOSIS — C50411 Malignant neoplasm of upper-outer quadrant of right female breast: Secondary | ICD-10-CM | POA: Diagnosis not present

## 2019-07-28 ENCOUNTER — Ambulatory Visit
Admission: RE | Admit: 2019-07-28 | Discharge: 2019-07-28 | Disposition: A | Discharge status: Home / Self Care | Payer: Federal, State, Local not specified - PPO | Source: Ambulatory Visit | Attending: Radiation Oncology | Admitting: Radiation Oncology

## 2019-07-28 ENCOUNTER — Other Ambulatory Visit: Payer: Self-pay

## 2019-07-28 DIAGNOSIS — C50411 Malignant neoplasm of upper-outer quadrant of right female breast: Secondary | ICD-10-CM | POA: Diagnosis not present

## 2019-07-29 ENCOUNTER — Ambulatory Visit
Admission: RE | Admit: 2019-07-29 | Discharge: 2019-07-29 | Disposition: A | Discharge status: Home / Self Care | Payer: Federal, State, Local not specified - PPO | Source: Ambulatory Visit | Attending: Radiation Oncology | Admitting: Radiation Oncology

## 2019-07-29 DIAGNOSIS — C50411 Malignant neoplasm of upper-outer quadrant of right female breast: Secondary | ICD-10-CM | POA: Diagnosis not present

## 2019-07-29 NOTE — Progress Notes (Signed)
[facility]  [address] [place], [place] [address] Phone: [phone]  Fax: [phone]   Clinic Day:  07/31/2019  Referring physician: Birdie Sons, MD  Chief Complaint: Elizabeth [name] is a 68 y.o. female with stage IAright breast cancer who is seen for 2 month assessment after completion of radiation and prior to initiation of endocrine therapy.   HPI: The patient was last seen in the medical oncology clinic on 05/25/2019.  At that time, she was s/p lumpectomy.  Exam revealed post-operative changes.  Oncotype DX testing revealed a low benefit of chemotherapy.  We discussed tamoxifen versus aromatase inhibitors post radiation.  She was scheduled to begin radiation on 06/02/2019. We discussed plans to initiate endocrine therapy after completion of radiation. She continued calcium and vitamin D.   She received radiation from 06/10/2019 - 07/30/2019.  During the interim, she has felt "fine". Patient has erythema and peeling of the skin after radiation. She is using Aquaphor healing ointment daily. She reports intermittent bilateral breast pain. She feels "sparks" in her left breast. She notes shooting pain in her body that will last for a short amount of time. Her left ankle pain has improved. She feels fatigued related to radiation. Patient would like to be on tamoxifen. She will see Dr Baruch Gouty on 09/02/2019 for follow-up.  She will have genetic testing done in 08/2019.    Past Medical History:  Diagnosis Date  . Anginal pain ([place])   . Anxiety   . Constipation due to opioid therapy   . Degenerative joint disease (DJD) of hip   . GERD (gastroesophageal reflux disease)   . Osteopenia   . Restless leg syndrome    takes Mirapex and xanax  . Scoliosis   . SUI (stress urinary incontinence, female)   . Vaso-vagal reaction    after back surgery    Past Surgical History:  Procedure Laterality Date  . APPENDECTOMY  1963  . BACK  SURGERY    . BREAST BIOPSY Right 04/27/2019   Affirm Bx "X" clip-INVASIVE MAMMARY CARCINOMA,  . BREAST CYST ASPIRATION Right 1988  . BREAST EXCISIONAL BIOPSY Right 1990   benign x 3  . BREAST LUMPECTOMY Right 05/08/2019  . BREAST LUMPECTOMY WITH SENTINEL LYMPH NODE BIOPSY Right 05/08/2019   Procedure: RIGHT BREAST LUMPECTOMY WITH SENTINEL LYMPH NODE BX AND WIRE LOC., LATEX ALLERGY;  Surgeon: Jules Husbands, MD;  Location: [facility];  Service: General;  Laterality: Right;  . BREAST SURGERY    . CATARACT EXTRACTION W/PHACO Right 03/31/2018   Procedure: CATARACT EXTRACTION PHACO AND INTRAOCULAR LENS PLACEMENT ([place]) RIGHT;  Surgeon: Leandrew Koyanagi, MD;  Location: [facility];  Service: Ophthalmology;  Laterality: Right;  Latex sensitivity  . CATARACT EXTRACTION W/PHACO Left 04/16/2018   Procedure: CATARACT EXTRACTION PHACO AND INTRAOCULAR LENS PLACEMENT ([place])  LEFT;  Surgeon: Leandrew Koyanagi, MD;  Location: [facility];  Service: Ophthalmology;  Laterality: Left;  . EYE SURGERY     Lasik  . FOOT SURGERY Bilateral   . HYSTEROTOMY    . LAMINECTOMY  2006  . ROTATOR CUFF REPAIR Bilateral   . TONSILLECTOMY     age 80  . TOTAL HIP ARTHROPLASTY Right 01/18/2015   Procedure: RIGHT TOTAL HIP ARTHROPLASTY ANTERIOR APPROACH;  Surgeon: Renette Butters, MD;  Location: [facility];  Service: Orthopedics;  Laterality: Right;  . TUBAL LIGATION      Family History  Problem Relation Age of Onset  . Alcohol abuse Mother   .  Cancer Mother 63       lung  . Migraines Mother   . Cancer Father 71       prostate  . Diabetes Father   . Alcohol abuse Brother   . Cancer Brother        skin  . Cancer Daughter        breast  . Breast cancer Daughter 11  . Bladder Cancer Neg Hx   . Kidney cancer Neg Hx     Social History:  reports that she quit smoking about 7 years ago. Her smoking use included cigarettes. She has a 3.00 pack-year smoking history. She has never used smokeless tobacco.  She reports current alcohol use of about 5.0 standard drinks of alcohol per week. She reports that she does not use drugs. She denies any known exposure to radiation or toxins. She is originally from [place], but lived in [place], [place] for many years and has been in [place] since 1999 after she got promoted working for [employer]. She is currently retired. She has several grandchildren and 10 great grandchildren.She lives in [place]. The patient is alone today.  Allergies:  Allergies  Allergen Reactions  . Zostavax [Zoster Vaccine Live] Rash  . Naproxen Hives  . Penicillins Hives    Did it involve swelling of the face/tongue/throat, SOB, or low BP? No Did it involve sudden or severe rash/hives, skin peeling, or any reaction on the inside of your mouth or nose? Yes Did you need to seek medical attention at a hospital or doctor's office? Yes When did it last happen?14 or 15 If all above answers are "NO", may proceed with cephalosporin use.   . Zoloft [Sertraline Hcl] Hives  . Latex Itching and Rash    Condoms    Current Medications: Current Outpatient Medications  Medication Sig Dispense Refill  . ALPRAZolam (XANAX) 0.5 MG tablet TAKE 3 TABLETS BY MOUTH EVERY NIGHT AT BEDTIME 90 tablet 4  . calcium-vitamin D (OSCAL WITH D) 500-200 MG-UNIT tablet Take 1 tablet by mouth daily.    . Multiple Vitamins-Minerals (MULTIVITAMIN WITH MINERALS) tablet Take 1 tablet by mouth 2 (two) times daily.     Marland Kitchen omeprazole (PRILOSEC) 20 MG capsule TAKE 1 CAPSULE BY MOUTH ONCE DAILY 90 capsule 2  . pramipexole (MIRAPEX) 1 MG tablet TAKE 1 TABLET(1 MG) BY MOUTH AT BEDTIME (Patient taking differently: Take 1 mg by mouth at bedtime. ) 30 tablet 12  . clindamycin (CLEOCIN) 150 MG capsule Take 450 mg by mouth See admin instructions. Take before dental procedures    . HYDROcodone-acetaminophen (NORCO/VICODIN) 5-325 MG tablet Take 1-2 tablets by mouth every 6 (six) hours as needed for moderate pain.  (Patient not taking: Reported on 05/25/2019) 20 tablet 0  . nitroGLYCERIN (NITROSTAT) 0.4 MG SL tablet Place 1 tablet (0.4 mg total) under the tongue every 5 (five) minutes as needed for chest pain. Go to ER if third tablet is necessary (Patient not taking: Reported on 07/31/2019) 30 tablet 5  . valACYclovir (VALTREX) 500 MG tablet Take 1 tablet (500 mg total) by mouth daily. Reported on 10/24/2015 (Patient not taking: Reported on 07/31/2019) 30 tablet 11   No current facility-administered medications for this visit.     Review of Systems  Constitutional: Positive for malaise/fatigue (radiation). Negative for chills, diaphoresis, fever and weight loss (up 6 pounds).       Feels "well".  HENT: Negative.  Negative for congestion, ear pain, hearing loss, nosebleeds, sinus pain and sore throat.  Eyes: Negative.  Negative for blurred vision and double vision.  Respiratory: Negative.  Negative for cough, shortness of breath and wheezing.   Cardiovascular: Positive for chest pain (bilateral breast pain, intermittent). Negative for palpitations, orthopnea, leg swelling and PND.  Gastrointestinal: Negative.  Negative for abdominal pain, blood in stool, constipation, diarrhea, melena, nausea and vomiting.  Genitourinary: Negative.  Negative for dysuria, frequency, hematuria and urgency.  Musculoskeletal: Negative for back pain, joint pain (left ankle, s/p fall, improved) and myalgias.  Skin: Negative.  Negative for rash.       Erythema and peeling after radiation. Using Aquaphor ointment.   Neurological: Negative.  Negative for dizziness, tingling, sensory change, weakness and headaches.  Endo/Heme/Allergies: Negative.  Does not bruise/bleed easily.  Psychiatric/Behavioral: Negative.  Negative for depression, memory loss and substance abuse. The patient is not nervous/anxious and does not have insomnia.   All other systems reviewed and are negative.  Performance status (ECOG): 0  Vitals Blood  pressure (!) 107/56, pulse 77, temperature (!) 97.1 F (36.2 C), temperature source Tympanic, resp. rate 18, height '5\' 3"'$  (1.6 m), weight 179 lb 2 oz (81.3 kg), SpO2 99 %.  Physical Exam  Constitutional: She is oriented to person, place, and time. She appears well-developed and well-nourished. No distress.  HENT:  Head: Normocephalic and atraumatic.  Mouth/Throat: Oropharynx is clear and moist. No oropharyngeal exudate.  Long brown hair. Wearing a mask.  Eyes: Pupils are equal, round, and reactive to light. Conjunctivae and EOM are normal. No scleral icterus.  Brown eyes.   Neck: Normal range of motion. Neck supple. No JVD present.  Cardiovascular: Normal rate, regular rhythm and normal heart sounds.  No murmur heard. Pulmonary/Chest: Effort normal and breath sounds normal. No respiratory distress. She has no wheezes. She has no rales. She exhibits no tenderness. Right breast exhibits skin change (mild grade I burn, radiation stickers still on). Right breast exhibits no inverted nipple, no mass, no nipple discharge and no tenderness. Left breast exhibits no inverted nipple, no mass, no nipple discharge, no skin change and no tenderness.  Abdominal: Soft. Bowel sounds are normal. She exhibits no distension and no mass. There is no abdominal tenderness. There is no rebound and no guarding.  Musculoskeletal: Normal range of motion.        General: No tenderness or edema.  Lymphadenopathy:       Head (right side): No preauricular, no posterior auricular and no occipital adenopathy present.       Head (left side): No preauricular, no posterior auricular and no occipital adenopathy present.    She has no cervical adenopathy.    She has no axillary adenopathy.       Right: No inguinal and no supraclavicular adenopathy present.       Left: No inguinal and no supraclavicular adenopathy present.  Neurological: She is alert and oriented to person, place, and time.  Skin: Skin is warm and dry. She is  not diaphoretic.  Psychiatric: She has a normal mood and affect. Her behavior is normal. Judgment and thought content normal.  Nursing note and vitals reviewed.   No visits with results within 3 Day(s) from this visit.  Latest known visit with results is:  Appointment on 07/23/2019  Component Date Value Ref Range Status  . WBC 07/23/2019 6.1  4.0 - 10.5 K/uL Final  . RBC 07/23/2019 4.47  3.87 - 5.11 MIL/uL Final  . Hemoglobin 07/23/2019 13.7  12.0 - 15.0 g/dL Final  . HCT 07/23/2019 42.9  36.0 - 46.0 % Final  . MCV 07/23/2019 96.0  80.0 - 100.0 fL Final  . MCH 07/23/2019 30.6  26.0 - 34.0 pg Final  . MCHC 07/23/2019 31.9  30.0 - 36.0 g/dL Final  . RDW 07/23/2019 13.5  11.5 - 15.5 % Final  . Platelets 07/23/2019 230  150 - 400 K/uL Final  . nRBC 07/23/2019 0.0  0.0 - 0.2 % Final   Performed at [facility], [address] [place], [place] [address]    Assessment:  Tiyah Zelenak is a 68 y.o. female withstage IAright breast cancers/p lumpectomy and sentinel lymph node biopsy on 05/08/2019.  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  Margins were uninvolved. One lymph node was negative for malignancy.  Tumor was ER + (> 90%), PR- (> 90%), and Her1/neu -.Pathologic stage was pT1c pN0.  CA27.29was 20.0 on 05/01/2019.  Oncotype DX testing revealed a score of 10, with a 3% chance of distant recurrence in 9 years with adjuvant endocrine therapy alone.  Absolute chemotherapy benefit was < 1%.  Right diagnostic mammogramon 04/22/2019 showed persistent architectural distortion in the right breast upper outer quadrant, posterior depth, which represents postsurgical scarring(scar marker).There was a separate asymmetry in the upper right breast, posterior depth, measuring 6-7 mm. There was no evidence of right axillary adenopathy.Ultrasound revealed no suspicious masses or shadowing lesions.  Bilateral  breast MRIon 05/04/2019 revealed a 1.1 cm right breast malignancy in the lateral midportion of the right breast. There were associated biopsy change. The left breast was negative. There was no evidence for adenopathy. Clinical stagewas T1cN0.  She received breast radiation from 06/10/2019 - 07/29/2019.  Bone density on 06/20/2010 revealed osteopeniawitha T score of -2.0 in the forearm radius. Bone density on 04/09/2019 revealed osteoporosiswith a T-score of -2.5 inthe forearm radius.  Symptomatically, she is doing well.  She has minor skin changes s/p radiation.  Plan: 1.  Labs today:  CBC with diff, CMP. 2.   Stage IA right breast cancer             Pathology revealed a 1.1 cm grade II invasive mammary carcinoma.               Lymph nodes were not involved.  Pathologic stage is T1cN0. Oncotype DX testing revealed a < 1% benefit of chemotherapy.              She completed radiation on 07/29/2019.  Review plan for endocrine therapy.   Discuss tamoxifen vs aromatase inhibitors.   Potential side effects reviewed.   Patient wishes to purse tamoxifen.   Information provided.   Consider switch to an AI after 2-3 years.            Rx: tamoxifen 20 mg a day. 3.Osteoporosis Continue calcium and vitamin D. Review prior conversation re:  bisphosphonate or RANK-ligand (Prolia). 4.   RTC in 1 month for MD assessment and labs (LFTs)..  I discussed the assessment and treatment plan with the patient.  The patient was provided an opportunity to ask questions and all were answered.  The patient agreed with the plan and demonstrated an understanding of the instructions.  The patient was advised to call back if the symptoms worsen or if the condition fails to improve as anticipated.  I provided 28 minutes of face-to-face time during this this encounter and > 50% was spent counseling as documented under my assessment and plan.    Melissa  Elmarie Mainland, MD,  PhD    07/31/2019, 11:33 AM  I, Selena Batten, am acting as scribe for [employer]. Mike Gip, MD, PhD.  I, Melissa C. Mike Gip, MD, have reviewed the above documentation for accuracy and completeness, and I agree with the above.

## 2019-07-30 ENCOUNTER — Other Ambulatory Visit: Payer: Self-pay

## 2019-07-30 ENCOUNTER — Ambulatory Visit
Admission: RE | Admit: 2019-07-30 | Discharge: 2019-07-30 | Disposition: A | Discharge status: Home / Self Care | Payer: Federal, State, Local not specified - PPO | Source: Ambulatory Visit | Attending: Radiation Oncology | Admitting: Radiation Oncology

## 2019-07-30 DIAGNOSIS — C50411 Malignant neoplasm of upper-outer quadrant of right female breast: Secondary | ICD-10-CM | POA: Diagnosis not present

## 2019-07-31 ENCOUNTER — Inpatient Hospital Stay: Payer: Federal, State, Local not specified - PPO

## 2019-07-31 ENCOUNTER — Inpatient Hospital Stay (HOSPITAL_BASED_OUTPATIENT_CLINIC_OR_DEPARTMENT_OTHER): Payer: Federal, State, Local not specified - PPO | Admitting: Hematology and Oncology

## 2019-07-31 ENCOUNTER — Encounter: Payer: Self-pay | Admitting: Hematology and Oncology

## 2019-07-31 VITALS — BP 107/56 | HR 77 | Temp 97.1°F | Resp 18 | Ht 63.0 in | Wt 179.1 lb

## 2019-07-31 DIAGNOSIS — Z170 Estrogen receptor positive status [ER+]: Secondary | ICD-10-CM

## 2019-07-31 DIAGNOSIS — C50411 Malignant neoplasm of upper-outer quadrant of right female breast: Secondary | ICD-10-CM

## 2019-07-31 LAB — CBC WITH DIFFERENTIAL/PLATELET
Abs Immature Granulocytes: 0.02 10*3/uL (ref 0.00–0.07)
Basophils Absolute: 0 10*3/uL (ref 0.0–0.1)
Basophils Relative: 0 %
Eosinophils Absolute: 0.1 10*3/uL (ref 0.0–0.5)
Eosinophils Relative: 1 %
HCT: 40.9 % (ref 36.0–46.0)
Hemoglobin: 13.4 g/dL (ref 12.0–15.0)
Immature Granulocytes: 0 %
Lymphocytes Relative: 31 %
Lymphs Abs: 1.9 10*3/uL (ref 0.7–4.0)
MCH: 30.6 pg (ref 26.0–34.0)
MCHC: 32.8 g/dL (ref 30.0–36.0)
MCV: 93.4 fL (ref 80.0–100.0)
Monocytes Absolute: 0.4 10*3/uL (ref 0.1–1.0)
Monocytes Relative: 7 %
Neutro Abs: 3.6 10*3/uL (ref 1.7–7.7)
Neutrophils Relative %: 61 %
Platelets: 227 10*3/uL (ref 150–400)
RBC: 4.38 MIL/uL (ref 3.87–5.11)
RDW: 13.6 % (ref 11.5–15.5)
WBC: 6 10*3/uL (ref 4.0–10.5)
nRBC: 0 % (ref 0.0–0.2)

## 2019-07-31 LAB — COMPREHENSIVE METABOLIC PANEL
ALT: 28 U/L (ref 0–44)
AST: 21 U/L (ref 15–41)
Albumin: 4.2 g/dL (ref 3.5–5.0)
Alkaline Phosphatase: 81 U/L (ref 38–126)
Anion gap: 6 (ref 5–15)
BUN: 16 mg/dL (ref 8–23)
CO2: 26 mmol/L (ref 22–32)
Calcium: 8.9 mg/dL (ref 8.9–10.3)
Chloride: 106 mmol/L (ref 98–111)
Creatinine, Ser: 0.68 mg/dL (ref 0.44–1.00)
GFR calc Af Amer: >60 mL/min (ref >60)
GFR calc non Af Amer: >60 mL/min (ref >60)
Glucose, Bld: 93 mg/dL (ref 70–99)
Potassium: 4.2 mmol/L (ref 3.5–5.1)
Sodium: 138 mmol/L (ref 135–145)
Total Bilirubin: 0.7 mg/dL (ref 0.3–1.2)
Total Protein: 7 g/dL (ref 6.5–8.1)

## 2019-07-31 NOTE — Patient Instructions (Signed)
Tamoxifen oral tablet What is this medicine? TAMOXIFEN (ta MOX i fen) blocks the effects of estrogen. It is commonly used to treat breast cancer. It is also used to decrease the chance of breast cancer coming back in women who have received treatment for the disease. It may also help prevent breast cancer in women who have a high risk of developing breast cancer. This medicine may be used for other purposes; ask your health care provider or pharmacist if you have questions. COMMON BRAND NAME(S): Nolvadex What should I tell my health care provider before I take this medicine? They need to know if you have any of these conditions:  blood clots  blood disease  cataracts or impaired eyesight  endometriosis  high calcium levels  high cholesterol  irregular menstrual cycles  liver disease  stroke  uterine fibroids  an unusual reaction to tamoxifen, other medicines, foods, dyes, or preservatives  pregnant or trying to get pregnant  breast-feeding How should I use this medicine? Take this medicine by mouth with a glass of water. Follow the directions on the prescription label. You can take it with or without food. Take your medicine at regular intervals. Do not take your medicine more often than directed. Do not stop taking except on your doctor's advice. A special MedGuide will be given to you by the pharmacist with each prescription and refill. Be sure to read this information carefully each time. Talk to your pediatrician regarding the use of this medicine in children. While this drug may be prescribed for selected conditions, precautions do apply. Overdosage: If you think you have taken too much of this medicine contact a poison control center or emergency room at once. NOTE: This medicine is only for you. Do not share this medicine with others. What if I miss a dose? If you miss a dose, take it as soon as you can. If it is almost time for your next dose, take only that dose. Do  not take double or extra doses. What may interact with this medicine? Do not take this medicine with any of the following medications:  cisapride  certain medicines for irregular heart beat like dronedarone, quinidine  certain medicines for fungal infection like fluconazole, posaconazole  pimozide  saquinavir  thioridazine This medicine may also interact with the following medications:  aminoglutethimide  anastrozole  bromocriptine  chemotherapy drugs  dofetilide  female hormones, like estrogens and birth control pills  letrozole  medroxyprogesterone  phenobarbital  rifampin  warfarin This list may not describe all possible interactions. Give your health care provider a list of all the medicines, herbs, non-prescription drugs, or dietary supplements you use. Also tell them if you smoke, drink alcohol, or use illegal drugs. Some items may interact with your medicine. What should I watch for while using this medicine? Visit your doctor or health care professional for regular checks on your progress. You will need regular pelvic exams, breast exams, and mammograms. If you are taking this medicine to reduce your risk of getting breast cancer, you should know that this medicine does not prevent all types of breast cancer. If breast cancer or other problems occur, there is no guarantee that it will be found at an early stage. Do not become pregnant while taking this medicine or for 2 months after stopping it. Women should inform their doctor if they wish to become pregnant or think they might be pregnant. There is a potential for serious side effects to an unborn child. Talk to your  health care professional or pharmacist for more information. Do not breast-feed an infant while taking this medicine or for 3 months after stopping it. This medicine may interfere with the ability to have a child. Talk with your doctor or health care professional if you are concerned about your  fertility. What side effects may I notice from receiving this medicine? Side effects that you should report to your doctor or health care professional as soon as possible:  allergic reactions like skin rash, itching or hives, swelling of the face, lips, or tongue  changes in vision  changes in your menstrual cycle  difficulty walking or talking  new breast lumps  numbness  pelvic pain or pressure  redness, blistering, peeling or loosening of the skin, including inside the mouth  signs and symptoms of a dangerous change in heartbeat or heart rhythm like chest pain, dizziness, fast or irregular heartbeat, palpitations, feeling faint or lightheaded, falls, breathing problems  sudden chest pain  swelling, pain or tenderness in your calf or leg  unusual bruising or bleeding  vaginal discharge that is bloody, brown, or rust  weakness  yellowing of the whites of the eyes or skin Side effects that usually do not require medical attention (report to your doctor or health care professional if they continue or are bothersome):  fatigue  hair loss, although uncommon and is usually mild  headache  hot flashes  impotence (in men)  nausea, vomiting (mild)  vaginal discharge (white or clear) This list may not describe all possible side effects. Call your doctor for medical advice about side effects. You may report side effects to FDA at 1-800-FDA-1088. Where should I keep my medicine? Keep out of the reach of children. Store at room temperature between 20 and 25 degrees C (68 and 77 degrees F). Protect from light. Keep container tightly closed. Throw away any unused medicine after the expiration date. NOTE: This sheet is a summary. It may not cover all possible information. If you have questions about this medicine, talk to your doctor, pharmacist, or health care provider.  2020 Elsevier/Gold Standard (2018-09-16 11:15:31)

## 2019-07-31 NOTE — Progress Notes (Signed)
Patient c/o intermittent pain noted to bilateral breast

## 2019-08-02 ENCOUNTER — Other Ambulatory Visit: Payer: Self-pay | Admitting: Pulmonary Disease

## 2019-08-03 ENCOUNTER — Encounter: Payer: Self-pay | Admitting: Hematology and Oncology

## 2019-08-04 ENCOUNTER — Other Ambulatory Visit: Payer: Self-pay | Admitting: Hematology and Oncology

## 2019-08-04 ENCOUNTER — Other Ambulatory Visit: Payer: Self-pay

## 2019-08-04 ENCOUNTER — Telehealth: Payer: Self-pay

## 2019-08-04 DIAGNOSIS — Z170 Estrogen receptor positive status [ER+]: Secondary | ICD-10-CM

## 2019-08-04 DIAGNOSIS — C50411 Malignant neoplasm of upper-outer quadrant of right female breast: Secondary | ICD-10-CM

## 2019-08-04 MED ORDER — TAMOXIFEN CITRATE 20 MG PO TABS: 20.0000 mg | ORAL_TABLET | Freq: Every day | ORAL | 1 refills | Status: DC

## 2019-08-04 NOTE — Telephone Encounter (Signed)
  Please call patient.  Rx sent.

## 2019-08-04 NOTE — Telephone Encounter (Signed)
Spoke with the patient to inform her that Dr Mike Gip sent the script for the tamoxifen. The patient was understanding.

## 2019-08-14 ENCOUNTER — Other Ambulatory Visit: Payer: Self-pay | Admitting: *Deleted

## 2019-08-14 DIAGNOSIS — F419 Anxiety disorder, unspecified: Secondary | ICD-10-CM

## 2019-08-14 MED ORDER — ALPRAZOLAM 0.5 MG PO TABS: 1.5000 mg | ORAL_TABLET | Freq: Every day | ORAL | 4 refills | Status: DC

## 2019-08-14 MED ORDER — VALACYCLOVIR HCL 500 MG PO TABS: 500.0000 mg | ORAL_TABLET | Freq: Every day | ORAL | 11 refills | Status: DC

## 2019-08-14 MED ORDER — NITROGLYCERIN 0.4 MG SL SUBL: 0.4000 mg | SUBLINGUAL_TABLET | SUBLINGUAL | 5 refills | Status: DC | PRN

## 2019-08-21 ENCOUNTER — Other Ambulatory Visit (INDEPENDENT_AMBULATORY_CARE_PROVIDER_SITE_OTHER): Payer: Federal, State, Local not specified - PPO

## 2019-08-21 DIAGNOSIS — Z23 Encounter for immunization: Secondary | ICD-10-CM

## 2019-08-24 ENCOUNTER — Other Ambulatory Visit: Payer: Self-pay

## 2019-08-24 ENCOUNTER — Ambulatory Visit (INDEPENDENT_AMBULATORY_CARE_PROVIDER_SITE_OTHER): Payer: Federal, State, Local not specified - PPO | Admitting: Surgery

## 2019-08-24 ENCOUNTER — Encounter: Payer: Self-pay | Admitting: Surgery

## 2019-08-24 VITALS — BP 127/74 | HR 66 | Temp 97.5°F | Resp 12 | Ht 62.0 in | Wt 175.6 lb

## 2019-08-24 DIAGNOSIS — C50411 Malignant neoplasm of upper-outer quadrant of right female breast: Secondary | ICD-10-CM

## 2019-08-24 NOTE — Patient Instructions (Signed)
Please call our office if you have any questions or concerns.  

## 2019-08-24 NOTE — Progress Notes (Signed)
Outpatient Surgical Follow Up  08/24/2019  Elizabeth [name] is an 68 y.o. female.   Chief Complaint  Patient presents with  . Follow-up    Right breast lumpectomy    HPI: Elizabeth [name] is a 68 year old patient well-known to me with a prior history of breast cancer ER/PR positive HER-2 negative status post lumpectomy sentinel lymph node biopsy and radiation therapy.  Surgery was performed on July 31.  She has done very well.  No complaints today some occasional sharp pains that last less than a second.  No fevers no chills no weight loss no new masses.  She is currently on tamoxifen and she is being followed by medical oncology  Past Medical History:  Diagnosis Date  . Anginal pain ([place])   . Anxiety   . Constipation due to opioid therapy   . Degenerative joint disease (DJD) of hip   . GERD (gastroesophageal reflux disease)   . Osteopenia   . Restless leg syndrome    takes Mirapex and xanax  . Scoliosis   . SUI (stress urinary incontinence, female)   . Vaso-vagal reaction    after back surgery    Past Surgical History:  Procedure Laterality Date  . APPENDECTOMY  1963  . BACK SURGERY    . BREAST BIOPSY Right 04/27/2019   Affirm Bx "X" clip-INVASIVE MAMMARY CARCINOMA,  . BREAST CYST ASPIRATION Right 1988  . BREAST EXCISIONAL BIOPSY Right 1990   benign x 3  . BREAST LUMPECTOMY Right 05/08/2019  . BREAST LUMPECTOMY WITH SENTINEL LYMPH NODE BIOPSY Right 05/08/2019   Procedure: RIGHT BREAST LUMPECTOMY WITH SENTINEL LYMPH NODE BX AND WIRE LOC., LATEX ALLERGY;  Surgeon: Jules Husbands, MD;  Location: [facility];  Service: General;  Laterality: Right;  . BREAST SURGERY    . CATARACT EXTRACTION W/PHACO Right 03/31/2018   Procedure: CATARACT EXTRACTION PHACO AND INTRAOCULAR LENS PLACEMENT ([place]) RIGHT;  Surgeon: Leandrew Koyanagi, MD;  Location: [place];  Service: Ophthalmology;  Laterality: Right;  Latex sensitivity  . CATARACT EXTRACTION W/PHACO Left 04/16/2018    Procedure: CATARACT EXTRACTION PHACO AND INTRAOCULAR LENS PLACEMENT ([place])  LEFT;  Surgeon: Leandrew Koyanagi, MD;  Location: [place];  Service: Ophthalmology;  Laterality: Left;  . EYE SURGERY     Lasik  . FOOT SURGERY Bilateral   . HYSTEROTOMY    . LAMINECTOMY  2006  . ROTATOR CUFF REPAIR Bilateral   . TONSILLECTOMY     age 70  . TOTAL HIP ARTHROPLASTY Right 01/18/2015   Procedure: RIGHT TOTAL HIP ARTHROPLASTY ANTERIOR APPROACH;  Surgeon: Renette Butters, MD;  Location: [facility];  Service: Orthopedics;  Laterality: Right;  . TUBAL LIGATION      Family History  Problem Relation Age of Onset  . Alcohol abuse Mother   . Cancer Mother 61       lung  . Migraines Mother   . Cancer Father 30       prostate  . Diabetes Father   . Alcohol abuse Brother   . Cancer Brother        skin  . Cancer Daughter        breast  . Breast cancer Daughter 60  . Bladder Cancer Neg Hx   . Kidney cancer Neg Hx     Social History:  reports that she quit smoking about 7 years ago. Her smoking use included cigarettes. She has a 3.00 pack-year smoking history. She has never used smokeless tobacco. She reports current alcohol use of about  5.0 standard drinks of alcohol per week. She reports that she does not use drugs.  Allergies:  Allergies  Allergen Reactions  . Zostavax [Zoster Vaccine Live] Rash  . Naproxen Hives  . Penicillins Hives    Did it involve swelling of the face/tongue/throat, SOB, or low BP? No Did it involve sudden or severe rash/hives, skin peeling, or any reaction on the inside of your mouth or nose? Yes Did you need to seek medical attention at a hospital or doctor's office? Yes When did it last happen?14 or 15 If all above answers are "NO", may proceed with cephalosporin use.   . Zoloft [Sertraline Hcl] Hives  . Latex Itching and Rash    Condoms    Medications reviewed.    ROS Full ROS performed and is otherwise negative other than what is stated in  HPI   BP 127/74   Pulse 66   Temp (!) 97.5 F (36.4 C) (Temporal)   Resp 12   Ht '5\' 2"'$  (1.575 m)   Wt 175 lb 9.6 oz (79.7 kg)   SpO2 97%   BMI 32.12 kg/m   Physical Exam Vitals signs and nursing note reviewed. Exam conducted with a chaperone present.  Constitutional:      General: She is not in acute distress.    Appearance: Normal appearance. She is normal weight.  Eyes:     General:        Right eye: No discharge.        Left eye: No discharge.  Neck:     Musculoskeletal: Normal range of motion and neck supple. No neck rigidity or muscular tenderness.  Cardiovascular:     Rate and Rhythm: Normal rate and regular rhythm.  Pulmonary:     Effort: Pulmonary effort is normal. No respiratory distress.     Breath sounds: No wheezing.     Comments: BREAST: Lumpectomy scar and sentinel lymph node biopsy.  No evidence of infection.  There is evidence of post radiation changes on her right breast without any new concerning masses.  There is no lymphadenopathy on either axilla and no other masses. Abdominal:     General: Abdomen is flat. There is no distension.     Tenderness: There is no abdominal tenderness. There is no guarding.  Musculoskeletal: Normal range of motion.        General: No swelling.  Lymphadenopathy:     Cervical: No cervical adenopathy.  Skin:    General: Skin is warm and dry.     Capillary Refill: Capillary refill takes less than 2 seconds.  Neurological:     General: No focal deficit present.     Mental Status: She is alert and oriented to person, place, and time.  Psychiatric:        Mood and Affect: Mood normal.        Behavior: Behavior normal.        Thought Content: Thought content normal.        Judgment: Judgment normal.         Assessment/Plan: [name] is a 68 year old female well-known to me status post lumpectomy radiation therapy for right breast cancer.  Doing well no evidence of recurrence.  She is followed by Dr. Mike Gip from  oncology.  No need for any surgical intervention at this time.  We will follow her on a as needed basis. Greater than 50% of the 25 minutes  visit was spent in counseling/coordination of care   Caroleen Hamman, MD [place] Surgeon

## 2019-08-27 ENCOUNTER — Other Ambulatory Visit: Payer: Self-pay | Admitting: Family Medicine

## 2019-08-27 NOTE — Telephone Encounter (Signed)
Requested medication (s) are due for refill today: yes  Requested medication (s) are on the active medication list:yes  Last refill:  11/03/2018  Future visit scheduled:no  Notes to clinic:  Last filled by a different provider than pcp Review for refill   Requested Prescriptions  Pending Prescriptions Disp Refills   omeprazole (PRILOSEC) 20 MG capsule 90 capsule 2    Sig: Take 1 capsule (20 mg total) by mouth daily.     Gastroenterology: Proton Pump Inhibitors Passed - 08/27/2019 10:43 AM      Passed - Valid encounter within last 12 months    Recent Outpatient Visits          6 months ago Annual physical exam   [facility] Birdie Sons, MD   8 months ago Lower abdominal pain   [facility] Birdie Sons, MD   9 months ago Sinusitis, unspecified chronicity, unspecified location   [facility] Birdie Sons, MD   1 year ago Inflamed internal hemorrhoid   [place], Vickki Muff, [place]   1 year ago [place], Kirstie Peri, MD

## 2019-08-27 NOTE — Telephone Encounter (Addendum)
Pt request refill   omeprazole (PRILOSEC) 20 MG capsule   Pt gets a 90 day  [place],  - [place] (Phone) [phone] (Fax)

## 2019-08-28 ENCOUNTER — Other Ambulatory Visit: Payer: Self-pay

## 2019-08-28 ENCOUNTER — Inpatient Hospital Stay: Payer: Medicare Other | Attending: Hematology and Oncology

## 2019-08-28 DIAGNOSIS — C50411 Malignant neoplasm of upper-outer quadrant of right female breast: Secondary | ICD-10-CM | POA: Diagnosis present

## 2019-08-28 DIAGNOSIS — M810 Age-related osteoporosis without current pathological fracture: Secondary | ICD-10-CM | POA: Insufficient documentation

## 2019-08-28 DIAGNOSIS — Z79810 Long term (current) use of selective estrogen receptor modulators (SERMs): Secondary | ICD-10-CM | POA: Diagnosis not present

## 2019-08-28 DIAGNOSIS — Z923 Personal history of irradiation: Secondary | ICD-10-CM | POA: Diagnosis not present

## 2019-08-28 DIAGNOSIS — Z170 Estrogen receptor positive status [ER+]: Secondary | ICD-10-CM

## 2019-08-28 LAB — HEPATIC FUNCTION PANEL
ALT: 19 U/L (ref 0–44)
AST: 21 U/L (ref 15–41)
Albumin: 4 g/dL (ref 3.5–5.0)
Alkaline Phosphatase: 58 U/L (ref 38–126)
Bilirubin, Direct: 0.1 mg/dL (ref 0.0–0.2)
Indirect Bilirubin: 0.5 mg/dL (ref 0.3–0.9)
Total Bilirubin: 0.6 mg/dL (ref 0.3–1.2)
Total Protein: 6.6 g/dL (ref 6.5–8.1)

## 2019-08-28 MED ORDER — OMEPRAZOLE 20 MG PO CPDR: 20.0000 mg | DELAYED_RELEASE_CAPSULE | Freq: Every day | ORAL | 4 refills | Status: DC

## 2019-08-30 NOTE — Progress Notes (Signed)
[facility]  [address], [address] [name], [name] [address] Phone: [phone]  Fax: [phone]   Telemedicine Office Visit:  08/31/2019  Referring physician: Birdie Sons, MD  I connected with [name] on 08/31/2019 at 9:25 AM by videoconferencing and verified that I was speaking with the correct person using 2 identifiers.  The patient was at home in her office.  I discussed the limitations, risk, security and privacy concerns of performing an evaluation and management service by videoconferencing and the availability of in person appointments.  I also discussed with the patient that there may be a patient responsible charge related to this service.  The patient expressed understanding and agreed to proceed.   Chief Complaint: Elizabeth [name] is a 68 y.o. female with stage IAright breast cancer who is seen for 1 month assessment.   HPI: The patient was last seen in the medical oncology clinic on 07/31/2019. At that time, she was doing well.  She had minor skin changes s/p radiation. We discussed tamoxifen versus aromatase inhibitors. Labs were unremarkable.  She continued on calcium and vitamin D.  We discussed initiation of tamoxifen.  She had a follow up with Dr. Dahlia Byes on 08/24/2019. She was doing very well. She had no complaints. Dr. Dahlia Byes was pleased with her progress.   Labs on 08/28/2019: LFTs were normal.   During the interim, she has felt "good". She has intermittent nausea secondary to being on tamoxifen. She would like Zofran for nausea. She feels like her face is puffy secondary to being on tamoxifen. She notes sporadic breast pain (4/10) that lasts for 3 seconds. She notes heartburn.    The skin changes on her breast s/p radiation are improving. She denies any tenderness. Patient concerned about getting into her hot tub. I advised patient to not get into her hot tub if she had any skin break down. She is taking  her calcium and vitamin D.    Past Medical History:  Diagnosis Date  . Anginal pain ([place])   . Anxiety   . Constipation due to opioid therapy   . Degenerative joint disease (DJD) of hip   . GERD (gastroesophageal reflux disease)   . Osteopenia   . Restless leg syndrome    takes Mirapex and xanax  . Scoliosis   . SUI (stress urinary incontinence, female)   . Vaso-vagal reaction    after back surgery    Past Surgical History:  Procedure Laterality Date  . APPENDECTOMY  1963  . BACK SURGERY    . BREAST BIOPSY Right 04/27/2019   Affirm Bx "X" clip-INVASIVE MAMMARY CARCINOMA,  . BREAST CYST ASPIRATION Right 1988  . BREAST EXCISIONAL BIOPSY Right 1990   benign x 3  . BREAST LUMPECTOMY Right 05/08/2019  . BREAST LUMPECTOMY WITH SENTINEL LYMPH NODE BIOPSY Right 05/08/2019   Procedure: RIGHT BREAST LUMPECTOMY WITH SENTINEL LYMPH NODE BX AND WIRE LOC., LATEX ALLERGY;  Surgeon: Jules Husbands, MD;  Location: [facility];  Service: General;  Laterality: Right;  . BREAST SURGERY    . CATARACT EXTRACTION W/PHACO Right 03/31/2018   Procedure: CATARACT EXTRACTION PHACO AND INTRAOCULAR LENS PLACEMENT ([place]) RIGHT;  Surgeon: Leandrew Koyanagi, MD;  Location: [place];  Service: Ophthalmology;  Laterality: Right;  Latex sensitivity  . CATARACT EXTRACTION W/PHACO Left 04/16/2018   Procedure: CATARACT EXTRACTION PHACO AND INTRAOCULAR LENS PLACEMENT ([place])  LEFT;  Surgeon: Leandrew Koyanagi, MD;  Location: [place];  Service: Ophthalmology;  Laterality: Left;  .  EYE SURGERY     Lasik  . FOOT SURGERY Bilateral   . HYSTEROTOMY    . LAMINECTOMY  2006  . ROTATOR CUFF REPAIR Bilateral   . TONSILLECTOMY     age 16  . TOTAL HIP ARTHROPLASTY Right 01/18/2015   Procedure: RIGHT TOTAL HIP ARTHROPLASTY ANTERIOR APPROACH;  Surgeon: Renette Butters, MD;  Location: [place];  Service: Orthopedics;  Laterality: Right;  . TUBAL LIGATION      Family History  Problem Relation Age of Onset   . Alcohol abuse Mother   . Cancer Mother 29       lung  . Migraines Mother   . Cancer Father 33       prostate  . Diabetes Father   . Alcohol abuse Brother   . Cancer Brother        skin  . Cancer Daughter        breast  . Breast cancer Daughter 57  . Bladder Cancer Neg Hx   . Kidney cancer Neg Hx     Social History:  reports that she quit smoking about 7 years ago. Her smoking use included cigarettes. She has a 3.00 pack-year smoking history. She has never used smokeless tobacco. She reports current alcohol use of about 5.0 standard drinks of alcohol per week. She reports that she does not use drugs. She denies any known exposure to radiation or toxins. She is originally from [place], but lived in [place], [place] for many years and has been in [place] since 1999 after she got promoted working for [employer]. She is currently retired. She has several grandchildren and 10 great grandchildren.She lives in [place]. The patient is alone today.  Participants in the patient's visit and their role in the encounter included the patient and Orlene Och, RN today.  The intake visit was provided by Orlene Och, RN.  Allergies:  Allergies  Allergen Reactions  . Zostavax [Zoster Vaccine Live] Rash  . Naproxen Hives  . Penicillins Hives    Did it involve swelling of the face/tongue/throat, SOB, or low BP? No Did it involve sudden or severe rash/hives, skin peeling, or any reaction on the inside of your mouth or nose? Yes Did you need to seek medical attention at a hospital or doctor's office? Yes When did it last happen?14 or 15 If all above answers are "NO", may proceed with cephalosporin use.   . Zoloft [Sertraline Hcl] Hives  . Latex Itching and Rash    Condoms    Current Medications: Current Outpatient Medications  Medication Sig Dispense Refill  . ALPRAZolam (XANAX) 0.5 MG tablet Take 3 tablets (1.5 mg total) by mouth at bedtime. 90 tablet 4  . calcium-vitamin  D (OSCAL WITH D) 500-200 MG-UNIT tablet Take 1 tablet by mouth daily.    . clindamycin (CLEOCIN) 150 MG capsule Take 450 mg by mouth See admin instructions. Take before dental procedures    . Multiple Vitamins-Minerals (MULTIVITAMIN WITH MINERALS) tablet Take 1 tablet by mouth 2 (two) times daily.     . nitroGLYCERIN (NITROSTAT) 0.4 MG SL tablet Place 1 tablet (0.4 mg total) under the tongue every 5 (five) minutes as needed for chest pain. Go to ER if third tablet is necessary 30 tablet 5  . omeprazole (PRILOSEC) 20 MG capsule Take 1 capsule (20 mg total) by mouth daily. 90 capsule 4  . pramipexole (MIRAPEX) 1 MG tablet TAKE 1 TABLET(1 MG) BY MOUTH AT BEDTIME (Patient taking differently: Take 1 mg by  mouth at bedtime. ) 30 tablet 12  . tamoxifen (NOLVADEX) 20 MG tablet Take 1 tablet (20 mg total) by mouth daily. 30 tablet 1  . valACYclovir (VALTREX) 500 MG tablet Take 1 tablet (500 mg total) by mouth daily. Reported on 10/24/2015 30 tablet 11   No current facility-administered medications for this visit.     Review of Systems  Constitutional: Negative.  Negative for chills, diaphoresis, fever, malaise/fatigue and weight loss.       Feels "good".  HENT: Negative.  Negative for congestion, ear pain, hearing loss, nosebleeds, sinus pain and sore throat.   Eyes: Negative.  Negative for blurred vision and double vision.  Respiratory: Negative.  Negative for cough, sputum production, shortness of breath and wheezing.   Cardiovascular: Positive for chest pain (sporadic brief breast pain (4/10)). Negative for palpitations, orthopnea, leg swelling and PND.  Gastrointestinal: Positive for heartburn and nausea (intermittent on tamoxifen). Negative for abdominal pain, blood in stool, constipation, diarrhea, melena and vomiting.  Genitourinary: Negative.  Negative for dysuria, frequency, hematuria and urgency.  Musculoskeletal: Negative for back pain, joint pain and myalgias.  Skin: Negative.  Negative for  itching and rash.       Radiation skin changes are improving.  Neurological: Negative.  Negative for dizziness, tingling, sensory change, weakness and headaches.  Endo/Heme/Allergies: Negative.  Does not bruise/bleed easily.  Psychiatric/Behavioral: Negative for depression, memory loss and substance abuse. The patient is nervous/anxious (related to health). The patient does not have insomnia.   All other systems reviewed and are negative.   Performance status (ECOG): 0  Physical Exam  Constitutional: She is oriented to person, place, and time. She appears well-developed and well-nourished. No distress.  HENT:  Head: Normocephalic and atraumatic.  Long dark hair.   Eyes: Conjunctivae and EOM are normal. No scleral icterus.  Brown eyes.   Neurological: She is alert and oriented to person, place, and time.  Psychiatric: She has a normal mood and affect. Her behavior is normal. Judgment and thought content normal.  Nursing note reviewed.   No visits with results within 3 Day(s) from this visit.  Latest known visit with results is:  Appointment on 08/28/2019  Component Date Value Ref Range Status  . Total Protein 08/28/2019 6.6  6.5 - 8.1 g/dL Final  . Albumin 08/28/2019 4.0  3.5 - 5.0 g/dL Final  . AST 08/28/2019 21  15 - 41 U/L Final  . ALT 08/28/2019 19  0 - 44 U/L Final  . Alkaline Phosphatase 08/28/2019 58  38 - 126 U/L Final  . Total Bilirubin 08/28/2019 0.6  0.3 - 1.2 mg/dL Final  . Bilirubin, Direct 08/28/2019 0.1  0.0 - 0.2 mg/dL Final  . Indirect Bilirubin 08/28/2019 0.5  0.3 - 0.9 mg/dL Final   Performed at [facility], [address] [place], [place] [address]    Assessment:  Delailah Spieth is a 68 y.o. female withstage IAright breast cancers/plumpectomyand sentinel lymph node biopsy on 05/08/2019. Pathologyrevealed a 1.1 cm grade IIinvasive mammary carcinoma with lobular features and adjacent DCIS. Margins were uninvolved. One lymph node  was negative for malignancy.Tumor was ER + (> 90%), PR- (> 90%), and Her1/neu -.Pathologic stagewas pT1c pN0. CA27.29was 20.0 on 05/01/2019.  OncotypeDXtestingrevealed a score of 10, with a 3% chance of distant recurrence in 9 years with adjuvant endocrine therapy alone.Absolute chemotherapy benefit was<1%.  Right diagnostic mammogramon 04/22/2019 showed persistent architectural distortion in the right breast upper outer quadrant, posterior depth, which represents postsurgical scarring(scar  marker).There was a separate asymmetry in the upper right breast, posterior depth, measuring 6-7 mm. There was no evidence of right axillary adenopathy.Ultrasound revealed no suspicious masses or shadowing lesions.  Bilateral breast MRIon 05/04/2019 revealed a 1.1 cm right breast malignancy in the lateral midportion of the right breast. There were associated biopsy change. The left breast was negative. There was no evidence for adenopathy. Clinical stagewas T1cN0.  She received breast radiation from 06/10/2019 - 07/29/2019.  She began tamoxifen on 08/04/2019.  Bone density on 06/20/2010 revealed osteopeniawitha T score of -2.0 in the forearm radius. Bone density on 04/09/2019 revealed osteoporosiswith a T-score of -2.5 inthe forearm radius.  Symptomatically, she is tolerating tamoxifen well with only slight nausea.  She notes mild breast hyperpigmentation s/p radiation.  Plan: 1.    Labs today: LFTs. 2.   Stage IA right breast cancer Clinically, she is doing well.  She is s/p lumpectomy and SLN biopsy (05/08/2019). She completed radiation on 07/29/2019.             She began tamoxifen on 08/04/2019.   She notes some minor nausea.    Rx:  ondansetron 4 mg po q 8 hours prn nausea.   Refill tamoxifen (dis: #90; 3 refills). 3.Osteoporosis She is on calcium and vitamin D. Consider future bisphosphonate or RANK-ligand  (Prolia). 4.RTC in 3 months for MD assessment and labs (CBC with diff, CMP, CA27.29).  I discussed the assessment and treatment plan with the patient.  The patient was provided an opportunity to ask questions and all were answered.  The patient agreed with the plan and demonstrated an understanding of the instructions.  The patient was advised to call back or seek an in person evaluation if the symptoms worsen or if the condition fails to improve as anticipated.  I provided 15 minutes (9:25 AM - 9:40 AM) of face-to-face video visit time during this this encounter and > 50% was spent counseling as documented under my assessment and plan.  I provided these services from the [facility] office.   Nolon Stalls, MD, PhD  08/31/2019, 9:25 AM  I, Selena Batten, am acting as scribe for [employer]. Mike Gip, MD, PhD.  I, Melissa C. Mike Gip, MD, have reviewed the above documentation for accuracy and completeness, and I agree with the above.

## 2019-08-31 ENCOUNTER — Inpatient Hospital Stay (HOSPITAL_BASED_OUTPATIENT_CLINIC_OR_DEPARTMENT_OTHER): Payer: Medicare Other | Admitting: Hematology and Oncology

## 2019-08-31 ENCOUNTER — Other Ambulatory Visit: Payer: Self-pay

## 2019-08-31 ENCOUNTER — Encounter: Payer: Self-pay | Admitting: Hematology and Oncology

## 2019-08-31 ENCOUNTER — Inpatient Hospital Stay: Payer: Medicare Other

## 2019-08-31 DIAGNOSIS — Z170 Estrogen receptor positive status [ER+]: Secondary | ICD-10-CM

## 2019-08-31 DIAGNOSIS — C50411 Malignant neoplasm of upper-outer quadrant of right female breast: Secondary | ICD-10-CM

## 2019-08-31 DIAGNOSIS — M810 Age-related osteoporosis without current pathological fracture: Secondary | ICD-10-CM

## 2019-08-31 MED ORDER — ONDANSETRON HCL 4 MG PO TABS: 4.0000 mg | ORAL_TABLET | Freq: Three times a day (TID) | ORAL | 0 refills | Status: DC | PRN

## 2019-08-31 MED ORDER — TAMOXIFEN CITRATE 20 MG PO TABS: 20.0000 mg | ORAL_TABLET | Freq: Every day | ORAL | 3 refills | Status: DC

## 2019-08-31 NOTE — Progress Notes (Signed)
Patient states that she is having some off and on nausea but states that she doesn't want to take anything for it. I advised patient that it might be nice to at least have something on hand in case it gets really bad and she decides she does want to take it. She also states the she has sporadic breast pain (4/10) that last for around 3 seconds and goes away. Since she has started the tamoxifen her face feels slightly puffy like she is taking a "light weight steroid" She needs refills on Tamoxifen.

## 2019-09-02 ENCOUNTER — Ambulatory Visit
Admission: RE | Admit: 2019-09-02 | Discharge: 2019-09-02 | Disposition: A | Discharge status: Home / Self Care | Payer: Federal, State, Local not specified - PPO | Source: Ambulatory Visit | Attending: Radiation Oncology | Admitting: Radiation Oncology

## 2019-09-02 ENCOUNTER — Other Ambulatory Visit: Payer: Self-pay

## 2019-09-02 ENCOUNTER — Encounter: Payer: Self-pay | Admitting: Radiation Oncology

## 2019-09-02 VITALS — BP 124/63 | HR 81 | Temp 98.7°F | Resp 18 | Wt 176.8 lb

## 2019-09-02 DIAGNOSIS — Z923 Personal history of irradiation: Secondary | ICD-10-CM | POA: Insufficient documentation

## 2019-09-02 DIAGNOSIS — Z170 Estrogen receptor positive status [ER+]: Secondary | ICD-10-CM

## 2019-09-02 DIAGNOSIS — Z79810 Long term (current) use of selective estrogen receptor modulators (SERMs): Secondary | ICD-10-CM | POA: Diagnosis not present

## 2019-09-02 DIAGNOSIS — C50411 Malignant neoplasm of upper-outer quadrant of right female breast: Secondary | ICD-10-CM | POA: Insufficient documentation

## 2019-09-02 NOTE — Progress Notes (Signed)
Radiation Oncology Follow up Note  Name: Elizabeth [name]   Date:   09/02/2019 MRN:  [id number] DOB: 03-23-1951    This 68 y.o. female presents to the clinic today for 1 month follow-up status post whole breast radiation to her right breast for stage Ia invasive mammary carcinoma ER/PR positive.  REFERRING PROVIDER: Birdie Sons, MD  HPI: Patient is a 68 year old female now at 1 month having completed whole breast radiation to her right breast status post wide local excision for an ER/PR positive HER-2/neu negative invasive mammary carcinoma with lobular features.  Seen today in routine follow-up she is doing well.  She specifically denies breast tenderness cough or bone pain.  She has been started on tamoxifen is tolerating that well without side effect..  COMPLICATIONS OF TREATMENT: none  FOLLOW UP COMPLIANCE: keeps appointments   PHYSICAL EXAM:  BP 124/63   Pulse 81   Temp 98.7 F (37.1 C) (Tympanic)   Resp 18   Wt 176 lb 12.8 oz (80.2 kg)   BMI 32.34 kg/m  Lungs are clear to A&P cardiac examination essentially unremarkable with regular rate and rhythm. No dominant mass or nodularity is noted in either breast in 2 positions examined. Incision is well-healed. No axillary or supraclavicular adenopathy is appreciated. Cosmetic result is excellent.  Well-developed well-nourished patient in NAD. HEENT reveals PERLA, EOMI, discs not visualized.  Oral cavity is clear. No oral mucosal lesions are identified. Neck is clear without evidence of cervical or supraclavicular adenopathy. Lungs are clear to A&P. Cardiac examination is essentially unremarkable with regular rate and rhythm without murmur rub or thrill. Abdomen is benign with no organomegaly or masses noted. Motor sensory and DTR levels are equal and symmetric in the upper and lower extremities. Cranial nerves II through XII are grossly intact. Proprioception is intact. No peripheral adenopathy or edema is identified. No  motor or sensory levels are noted. Crude visual fields are within normal range.  RADIOLOGY RESULTS: No current films to review  PLAN: Present time she is doing well excellent cosmetic result of whole breast radiation.  I am pleased with her overall progress.  I have asked to see her back in 4 to 5 months for follow-up.  She continues on tamoxifen without side effect.  She knows to call with any concerns.  I would like to take this opportunity to thank you for allowing me to participate in the care of your patient.Noreene Filbert, MD

## 2019-10-06 ENCOUNTER — Encounter: Payer: Self-pay | Admitting: *Deleted

## 2019-10-13 ENCOUNTER — Encounter: Payer: Self-pay | Admitting: Podiatry

## 2019-10-13 ENCOUNTER — Encounter: Payer: Self-pay | Admitting: Hematology and Oncology

## 2019-10-14 ENCOUNTER — Other Ambulatory Visit: Payer: Self-pay | Admitting: Podiatry

## 2019-10-14 ENCOUNTER — Other Ambulatory Visit: Payer: Self-pay

## 2019-10-14 ENCOUNTER — Ambulatory Visit (INDEPENDENT_AMBULATORY_CARE_PROVIDER_SITE_OTHER): Payer: Federal, State, Local not specified - PPO

## 2019-10-14 ENCOUNTER — Encounter: Payer: Self-pay | Admitting: Podiatry

## 2019-10-14 ENCOUNTER — Ambulatory Visit (INDEPENDENT_AMBULATORY_CARE_PROVIDER_SITE_OTHER): Payer: Federal, State, Local not specified - PPO | Admitting: Podiatry

## 2019-10-14 DIAGNOSIS — S92355A Nondisplaced fracture of fifth metatarsal bone, left foot, initial encounter for closed fracture: Secondary | ICD-10-CM

## 2019-10-14 DIAGNOSIS — M722 Plantar fascial fibromatosis: Secondary | ICD-10-CM

## 2019-10-14 DIAGNOSIS — S90129A Contusion of unspecified lesser toe(s) without damage to nail, initial encounter: Secondary | ICD-10-CM

## 2019-10-14 NOTE — Progress Notes (Signed)
She presents today with a chief complaint of a painful fifth metatarsal and fifth toe of the left foot after tripping over her new puppy and hitting the couch.  She states that the toe is very painful and she also has a painful area to the right heel.  Objective: Vital signs are stable alert and oriented x3.  There is no erythema edema cellulitis drainage or odor she has mild tenderness on palpation of the fifth metatarsal head of the left foot and the fifth toe left foot there is no ecchymosis no signs of fracture or infection other than a radiograph which does demonstrate a crack across the surgical neck of the fifth metatarsal though it does not appear to be through and through is nondisplaced and noncomminuted.  She does have pain on palpation in the plantar fascial calcaneal insertion site a little more proximal than normal in the right calcaneus.  She has no pain on medial and lateral compression of the right calcaneus.  Assessment: Proximal plantar fasciitis right.  Contusion fracture fifth metatarsal left.  Plan: I injected the right heel today 20 mg Kenalog 5 mg Marcaine.  Instructed her to continue wearing stiff soled shoe to the left foot that should go on to heal uneventfully with no problems.  I will follow-up with her in the near future for follow-up if necessary.

## 2019-10-15 ENCOUNTER — Encounter: Payer: Self-pay | Admitting: Family Medicine

## 2019-11-03 ENCOUNTER — Ambulatory Visit: Payer: Federal, State, Local not specified - PPO

## 2019-11-05 ENCOUNTER — Encounter: Payer: Federal, State, Local not specified - PPO | Admitting: Licensed Clinical Social Worker

## 2019-11-12 ENCOUNTER — Ambulatory Visit: Payer: Federal, State, Local not specified - PPO | Attending: Internal Medicine

## 2019-11-12 DIAGNOSIS — Z23 Encounter for immunization: Secondary | ICD-10-CM | POA: Insufficient documentation

## 2019-11-12 NOTE — Progress Notes (Signed)
   Covid-19 Vaccination Clinic  Name:  Elizabeth [name]    MRN: [id number] DOB: 1951-08-04  11/12/2019  Ms. [name] was observed post Covid-19 immunization for 15 minutes without incidence. She was provided with Vaccine Information Sheet and instruction to access the V-Safe system.   Ms. [name] was instructed to call 911 with any severe reactions post vaccine: Marland Kitchen Difficulty breathing  . Swelling of your face and throat  . A fast heartbeat  . A bad rash all over your body  . Dizziness and weakness    Immunizations Administered    Name Date Dose VIS Date Route   Pfizer COVID-19 Vaccine 11/12/2019  5:03 PM 0.3 mL 09/18/2019 Intramuscular   Manufacturer: [place]   Lot: [id number]   [place]: [address]-9

## 2019-11-18 ENCOUNTER — Other Ambulatory Visit: Payer: Self-pay | Admitting: Family Medicine

## 2019-11-18 DIAGNOSIS — F419 Anxiety disorder, unspecified: Secondary | ICD-10-CM

## 2019-11-19 ENCOUNTER — Ambulatory Visit: Payer: Federal, State, Local not specified - PPO

## 2019-11-24 ENCOUNTER — Ambulatory Visit: Payer: Federal, State, Local not specified - PPO

## 2019-11-26 NOTE — Progress Notes (Signed)
Elizabeth [name]  [address] [place], [place] [address] Phone: [phone]  Fax: [phone]   Telemedicine Office Visit:  11/30/2019  Referring physician: Birdie Sons, MD  I connected with Elizabeth [name] on 11/30/2019 at 1:52 PM by videoconferencing and verified that I was speaking with the correct person using 2 identifiers.  The patient was at home.  I discussed the limitations, risk, security and privacy concerns of performing an evaluation and management service by videoconferencing and the availability of in person appointments.  I also discussed with the patient that there may be a patient responsible charge related to this service.  The patient expressed understanding and agreed to proceed.   Chief Complaint: Elizabeth [name] is a 69 y.o. female  with stage IAright breast canceron tamoxifen who is seen for 3 monthassessment.   HPI: The patient was last seen in the medical oncology clinic on 08/31/2019 vie telemedicine. At that time, she was tolerating tamoxifen well with only slight nausea. She noted mild breast hyperpigmentation s/p radiation. LFTs were normal. She continued calcium and vitamin D. She remained on tamoxifen.   Patient was seen by Dr. Baruch Gouty on 09/02/2019. She was doing well. She had no cough or bone pain. She will be seen in 4 to 5 months.   Patient had a COVID-19 vaccine on 11/12/2019. Second vaccine is scheduled for 12/08/2019.   Labs on 11/27/2019: hematocrit 40.3, hemoglobin 12.9, platelets 214,000, WBC 7,700. CMP was normal. CA 27.29 was 210.2.   During the interim, her nausea has resolved. Her heartburn is controlled with Prilosec.  She has occasional sporadic breast pain.  She has darkening of the skin in the field of radiation. Skin changes are healing. She denies any bone pain. She notes joint pain in her artificial hip secondary to cold weather.  She is concerned about her CA 27.29 level.    She has no symptoms that would correspond to a CA 27.29 level of 210.2. She would like to have labs redone today in the clinic.    Past Medical History:  Diagnosis Date  . Anginal pain ([place])   . Anxiety   . Constipation due to opioid therapy   . Degenerative joint disease (DJD) of hip   . GERD (gastroesophageal reflux disease)   . Osteopenia   . Restless leg syndrome    takes Mirapex and xanax  . Scoliosis   . SUI (stress urinary incontinence, female)   . Vaso-vagal reaction    after back surgery    Past Surgical History:  Procedure Laterality Date  . APPENDECTOMY  1963  . BACK SURGERY    . BREAST BIOPSY Right 04/27/2019   Affirm Bx "X" clip-INVASIVE MAMMARY CARCINOMA,  . BREAST CYST ASPIRATION Right 1988  . BREAST EXCISIONAL BIOPSY Right 1990   benign x 3  . BREAST LUMPECTOMY Right 05/08/2019  . BREAST LUMPECTOMY WITH SENTINEL LYMPH NODE BIOPSY Right 05/08/2019   Procedure: RIGHT BREAST LUMPECTOMY WITH SENTINEL LYMPH NODE BX AND WIRE LOC., LATEX ALLERGY;  Surgeon: Jules Husbands, MD;  Location: [facility];  Service: General;  Laterality: Right;  . BREAST SURGERY    . CATARACT EXTRACTION W/PHACO Right 03/31/2018   Procedure: CATARACT EXTRACTION PHACO AND INTRAOCULAR LENS PLACEMENT ([place]) RIGHT;  Surgeon: Leandrew Koyanagi, MD;  Location: [facility];  Service: Ophthalmology;  Laterality: Right;  Latex sensitivity  . CATARACT EXTRACTION W/PHACO Left 04/16/2018   Procedure: CATARACT EXTRACTION PHACO AND INTRAOCULAR LENS PLACEMENT ([facility])  LEFT;  Surgeon: Wallace Going,  Nila Nephew, MD;  Location: [facility];  Service: Ophthalmology;  Laterality: Left;  . EYE SURGERY     Lasik  . FOOT SURGERY Bilateral   . HYSTEROTOMY    . LAMINECTOMY  2006  . ROTATOR CUFF REPAIR Bilateral   . TONSILLECTOMY     age 3  . TOTAL HIP ARTHROPLASTY Right 01/18/2015   Procedure: RIGHT TOTAL HIP ARTHROPLASTY ANTERIOR APPROACH;  Surgeon: Renette Butters, MD;  Location: [facility];  Service:  Orthopedics;  Laterality: Right;  . TUBAL LIGATION      Family History  Problem Relation Age of Onset  . Alcohol abuse Mother   . Cancer Mother 74       lung  . Migraines Mother   . Cancer Father 72       prostate  . Diabetes Father   . Alcohol abuse Brother   . Cancer Brother        skin  . Cancer Daughter        breast  . Breast cancer Daughter 71  . Bladder Cancer Neg Hx   . Kidney cancer Neg Hx     Social History:  reports that she quit smoking about 7 years ago. Her smoking use included cigarettes. She has a 3.00 pack-year smoking history. She has never used smokeless tobacco. She reports current alcohol use of about 5.0 standard drinks of alcohol per week. She reports that she does not use drugs. She denies any known exposure to radiation or toxins. She is originally from [place], but lived in [place], [place] for many years and has been in [place] since 1999 after she got promoted working for [employer]. She is currently retired. She has several grandchildren and 10 great grandchildren.She lives in [place]. The patient is alone today.  Participants in the patient's visit and their role in the encounter included the patient and Vito Berger, CMA, today.  The intake visit was provided by Vito Berger, CMA.  Allergies:  Allergies  Allergen Reactions  . Zostavax [Zoster Vaccine Live] Rash  . Naproxen Hives  . Penicillins Hives    Did it involve swelling of the face/tongue/throat, SOB, or low BP? No Did it involve sudden or severe rash/hives, skin peeling, or any reaction on the inside of your mouth or nose? Yes Did you need to seek medical attention at a [name] or doctor's office? Yes When did it last happen?14 or 15 If all above answers are "NO", may proceed with cephalosporin use.   . Zoloft [Sertraline Hcl] Hives  . Latex Itching and Rash    Condoms    Current Medications: Current Outpatient Medications  Medication Sig Dispense Refill  .  ALPRAZolam (XANAX) 0.5 MG tablet TAKE 3 TABLETS BY MOUTH AT BEDTIME 90 tablet 5  . calcium-vitamin D (OSCAL WITH D) 500-200 MG-UNIT tablet Take 1 tablet by mouth daily.    . Multiple Vitamins-Minerals (MULTIVITAMIN WITH MINERALS) tablet Take 1 tablet by mouth 2 (two) times daily.     Marland Kitchen omeprazole (PRILOSEC) 20 MG capsule Take 1 capsule (20 mg total) by mouth daily. 90 capsule 4  . pramipexole (MIRAPEX) 1 MG tablet TAKE 1 TABLET(1 MG) BY MOUTH AT BEDTIME (Patient taking differently: Take 1 mg by mouth at bedtime. ) 30 tablet 12  . tamoxifen (NOLVADEX) 20 MG tablet Take 1 tablet (20 mg total) by mouth daily. 90 tablet 3  . clindamycin (CLEOCIN) 150 MG capsule Take 450 mg by mouth See admin instructions. Take before dental procedures    .  nitroGLYCERIN (NITROSTAT) 0.4 MG SL tablet Place 1 tablet (0.4 mg total) under the tongue every 5 (five) minutes as needed for chest pain. Go to ER if third tablet is necessary (Patient not taking: Reported on 11/30/2019) 30 tablet 5  . ondansetron (ZOFRAN) 4 MG tablet Take 1 tablet (4 mg total) by mouth every 8 (eight) hours as needed for nausea or vomiting. (Patient not taking: Reported on 11/30/2019) 20 tablet 0  . valACYclovir (VALTREX) 500 MG tablet Take 1 tablet (500 mg total) by mouth daily. Reported on 10/24/2015 (Patient not taking: Reported on 11/30/2019) 30 tablet 11   No current facility-administered medications for this visit.     Review of Systems  Constitutional: Negative.  Negative for chills, diaphoresis, fever, malaise/fatigue and weight loss.       No new symptoms.  HENT: Negative.  Negative for congestion, ear pain, hearing loss, nosebleeds, sinus pain and sore throat.   Eyes: Negative.  Negative for blurred vision and double vision.  Respiratory: Negative.  Negative for cough, sputum production, shortness of breath and wheezing.   Cardiovascular: Positive for chest pain (occasional brief breast pain). Negative for palpitations, orthopnea, leg  swelling and PND.  Gastrointestinal: Positive for heartburn (controlled with Prilosec). Negative for abdominal pain, blood in stool, constipation, diarrhea, melena, nausea (resolved) and vomiting.  Genitourinary: Negative.  Negative for dysuria, frequency, hematuria and urgency.  Musculoskeletal: Positive for joint pain (artificial hip). Negative for back pain and myalgias.  Skin: Negative.  Negative for itching and rash.       Radiation skin changes, improving.  Neurological: Negative.  Negative for dizziness, tingling, sensory change, weakness and headaches.  Endo/Heme/Allergies: Negative.  Does not bruise/bleed easily.  Psychiatric/Behavioral: Negative for depression, memory loss and substance abuse. The patient is nervous/anxious (related to lab results). The patient does not have insomnia.   All other systems reviewed and are negative.   Performance status (ECOG): 0  Physical Exam  Constitutional: She is oriented to person, place, and time. She appears well-developed and well-nourished. No distress.  HENT:  Head: Normocephalic and atraumatic.  Long dark hair.   Eyes: Conjunctivae and EOM are normal. No scleral icterus.  Brown eyes.   Neurological: She is alert and oriented to person, place, and time.  Skin: She is not diaphoretic.  Psychiatric: She has a normal mood and affect. Her behavior is normal. Judgment and thought content normal.  Nursing note reviewed.   No visits with results within 3 Day(s) from this visit.  Latest known visit with results is:  Appointment on 11/27/2019  Component Date Value Ref Range Status  . Sodium 11/27/2019 141  135 - 145 mmol/L Final  . Potassium 11/27/2019 4.4  3.5 - 5.1 mmol/L Final  . Chloride 11/27/2019 105  98 - 111 mmol/L Final  . CO2 11/27/2019 25  22 - 32 mmol/L Final  . Glucose, Bld 11/27/2019 103* 70 - 99 mg/dL Final  . BUN 11/27/2019 20  8 - 23 mg/dL Final  . Creatinine, Ser 11/27/2019 0.57  0.44 - 1.00 mg/dL Final  . Calcium  11/27/2019 9.0  8.9 - 10.3 mg/dL Final  . Total Protein 11/27/2019 7.0  6.5 - 8.1 g/dL Final  . Albumin 11/27/2019 3.9  3.5 - 5.0 g/dL Final  . AST 11/27/2019 22  15 - 41 U/L Final  . ALT 11/27/2019 27  0 - 44 U/L Final  . Alkaline Phosphatase 11/27/2019 49  38 - 126 U/L Final  . Total Bilirubin 11/27/2019 0.6  0.3 - 1.2 mg/dL Final  . GFR calc non Af Amer 11/27/2019 >60  >60 mL/min Final  . GFR calc Af Amer 11/27/2019 >60  >60 mL/min Final  . Anion gap 11/27/2019 11  5 - 15 Final   Performed at [name], [address] [place], [place] [address]  . CA 27.29 11/27/2019 210.2* 0.0 - 38.6 U/mL Final   Comment: (NOTE) Siemens Centaur Immunochemiluminometric Methodology [facility]) Values obtained with different assay methods or kits cannot be used interchangeably. Results cannot be interpreted as absolute evidence of the presence or absence of malignant disease. Performed At: [facility] [place], [place] [id number] Rush Farmer MD [id number]   . WBC 11/27/2019 7.7  4.0 - 10.5 K/uL Final  . RBC 11/27/2019 4.24  3.87 - 5.11 MIL/uL Final  . Hemoglobin 11/27/2019 12.9  12.0 - 15.0 g/dL Final  . HCT 11/27/2019 40.3  36.0 - 46.0 % Final  . MCV 11/27/2019 95.0  80.0 - 100.0 fL Final  . MCH 11/27/2019 30.4  26.0 - 34.0 pg Final  . MCHC 11/27/2019 32.0  30.0 - 36.0 g/dL Final  . RDW 11/27/2019 13.1  11.5 - 15.5 % Final  . Platelets 11/27/2019 214  150 - 400 K/uL Final  . nRBC 11/27/2019 0.0  0.0 - 0.2 % Final  . Neutrophils Relative % 11/27/2019 79  % Final  . Neutro Abs 11/27/2019 6.1  1.7 - 7.7 K/uL Final  . Lymphocytes Relative 11/27/2019 14  % Final  . Lymphs Abs 11/27/2019 1.1  0.7 - 4.0 K/uL Final  . Monocytes Relative 11/27/2019 6  % Final  . Monocytes Absolute 11/27/2019 0.5  0.1 - 1.0 K/uL Final  . Eosinophils Relative 11/27/2019 1  % Final  . Eosinophils Absolute 11/27/2019 0.1  0.0 - 0.5 K/uL Final  . Basophils Relative 11/27/2019 0  % Final    . Basophils Absolute 11/27/2019 0.0  0.0 - 0.1 K/uL Final  . Immature Granulocytes 11/27/2019 0  % Final  . Abs Immature Granulocytes 11/27/2019 0.03  0.00 - 0.07 K/uL Final   Performed at [facility], [address], [place], [place] [address]    Assessment:  Elizabeth [name] is a 69 y.o. female withstage IAright breast cancers/plumpectomyand sentinel lymph node biopsy on 05/08/2019. Pathologyrevealed a 1.1 cm grade IIinvasive mammary carcinoma with lobular features and adjacent DCIS. Margins were uninvolved. One lymph node was negative for malignancy.Tumor was ER + (> 90%), PR- (> 90%), and Her1/neu -.Pathologic stagewas pT1c pN0. CA27.29was 20.0 on 05/01/2019.  OncotypeDXtestingrevealed a score of 10, with a 3% chance of distant recurrence in 9 years with adjuvant endocrine therapy alone.Absolute chemotherapy benefit was<1%.  Right diagnostic mammogramon 04/22/2019 showed persistent architectural distortion in the right breast upper outer quadrant, posterior depth, which represents postsurgical scarring(scar marker).There was a separate asymmetry in the upper right breast, posterior depth, measuring 6-7 mm. There was no evidence of right axillary adenopathy.Ultrasound revealed no suspicious masses or shadowing lesions.  Bilateral breast MRIon 05/04/2019 revealed a 1.1 cm right breast malignancy in the lateral midportion of the right breast. There were associated biopsy change. The left breast was negative. There was no evidence for adenopathy. Clinical stagewas T1cN0.  She received breast radiationfrom 06/10/2019 - 07/29/2019.  She began tamoxifen on 08/04/2019.  CA27.29 has been followed: 20.2 on 05/01/2019 and 210.2 on 11/27/2019.  Bone density on09/13/2011 revealed osteopeniawitha T score of -2.0 in the forearm radius. Bone density on 04/09/2019 revealed osteoporosiswith a T-score of -  2.5 inthe forearm radius.  She is  schedule to receive her second COVID-19 vaccine on 12/08/2019.  Symptomatically, she denies any weight loss, bone pain, or abdominal symptoms.  CA27.29 is  210.2.  Plan: 1.   Review labs from 11/27/2019. 2.   Labs today: CA 27.29, CA15-3. 3. Stage IA right breast cancer Clinically, she appears to be doing well.  She is asymptomatic.             She is s/p lumpectomy and SLN biopsy (05/08/2019). She completed radiation on 07/29/2019. She began tamoxifen on 08/04/2019.  CA27.29 has increased 10 fold since 04/2019.   Etiology unclear:  lab error, ? vaccine related.   Doubt recurrent breast cancer given initial features of disease and Oncotype Dx testing.  Discuss repeating CA27.29 and adding CA15-3 (another tumor marker for breast cancer).   If markers still elevated, discuss imaging (PET scan). 4.Osteoporosis She remains on calcium and vitamin D.. She is on tamoxifen as compared to an aromatase inhibitor.  If bone density decreases, consider a bisphosphonate or RANK-ligand (Prolia). 5.RTC in 3 months for MD assessment and labs (CBC with diff, CMP, CA27.29).   Addendum:  Repeat CA27.29 was 214.7 (high) and CA15-3 60.6 (0-25).  Patient was contacted on 12/01/2019.  Imaging studies ordered.  I discussed the assessment and treatment plan with the patient.  The patient was provided an opportunity to ask questions and all were answered.  The patient agreed with the plan and demonstrated an understanding of the instructions.  The patient was advised to call back or seek an in person evaluation if the symptoms worsen or if the condition fails to improve as anticipated.  I provided 15 minutes (1:52 PM - 2:06 PM) of face-to-face video visit time during this this encounter and > 50% was spent counseling as documented under my assessment and plan.  I provided these services from the [facility] office.   Nolon Stalls, MD, PhD   11/30/2019, 1:52 PM  I, Selena Batten, am acting as scribe for [employer]. Mike Gip, MD, PhD.  I, Carly Applegate C. Mike Gip, MD, have reviewed the above documentation for accuracy and completeness, and I agree with the above.

## 2019-11-27 ENCOUNTER — Inpatient Hospital Stay: Payer: Federal, State, Local not specified - PPO

## 2019-11-27 ENCOUNTER — Inpatient Hospital Stay: Payer: Federal, State, Local not specified - PPO | Attending: Hematology and Oncology

## 2019-11-27 ENCOUNTER — Other Ambulatory Visit: Payer: Self-pay

## 2019-11-27 DIAGNOSIS — Z923 Personal history of irradiation: Secondary | ICD-10-CM | POA: Insufficient documentation

## 2019-11-27 DIAGNOSIS — C50411 Malignant neoplasm of upper-outer quadrant of right female breast: Secondary | ICD-10-CM | POA: Insufficient documentation

## 2019-11-27 DIAGNOSIS — Z170 Estrogen receptor positive status [ER+]: Secondary | ICD-10-CM

## 2019-11-27 DIAGNOSIS — Z79810 Long term (current) use of selective estrogen receptor modulators (SERMs): Secondary | ICD-10-CM | POA: Insufficient documentation

## 2019-11-27 LAB — COMPREHENSIVE METABOLIC PANEL
ALT: 27 U/L (ref 0–44)
AST: 22 U/L (ref 15–41)
Albumin: 3.9 g/dL (ref 3.5–5.0)
Alkaline Phosphatase: 49 U/L (ref 38–126)
Anion gap: 11 (ref 5–15)
BUN: 20 mg/dL (ref 8–23)
CO2: 25 mmol/L (ref 22–32)
Calcium: 9 mg/dL (ref 8.9–10.3)
Chloride: 105 mmol/L (ref 98–111)
Creatinine, Ser: 0.57 mg/dL (ref 0.44–1.00)
GFR calc Af Amer: >60 mL/min (ref >60)
GFR calc non Af Amer: >60 mL/min (ref >60)
Glucose, Bld: 103 mg/dL — ABNORMAL HIGH (ref 70–99)
Potassium: 4.4 mmol/L (ref 3.5–5.1)
Sodium: 141 mmol/L (ref 135–145)
Total Bilirubin: 0.6 mg/dL (ref 0.3–1.2)
Total Protein: 7 g/dL (ref 6.5–8.1)

## 2019-11-27 LAB — CBC WITH DIFFERENTIAL/PLATELET
Abs Immature Granulocytes: 0.03 10*3/uL (ref 0.00–0.07)
Basophils Absolute: 0 10*3/uL (ref 0.0–0.1)
Basophils Relative: 0 %
Eosinophils Absolute: 0.1 10*3/uL (ref 0.0–0.5)
Eosinophils Relative: 1 %
HCT: 40.3 % (ref 36.0–46.0)
Hemoglobin: 12.9 g/dL (ref 12.0–15.0)
Immature Granulocytes: 0 %
Lymphocytes Relative: 14 %
Lymphs Abs: 1.1 10*3/uL (ref 0.7–4.0)
MCH: 30.4 pg (ref 26.0–34.0)
MCHC: 32 g/dL (ref 30.0–36.0)
MCV: 95 fL (ref 80.0–100.0)
Monocytes Absolute: 0.5 10*3/uL (ref 0.1–1.0)
Monocytes Relative: 6 %
Neutro Abs: 6.1 10*3/uL (ref 1.7–7.7)
Neutrophils Relative %: 79 %
Platelets: 214 10*3/uL (ref 150–400)
RBC: 4.24 MIL/uL (ref 3.87–5.11)
RDW: 13.1 % (ref 11.5–15.5)
WBC: 7.7 10*3/uL (ref 4.0–10.5)
nRBC: 0 % (ref 0.0–0.2)

## 2019-11-28 ENCOUNTER — Encounter: Payer: Self-pay | Admitting: Hematology and Oncology

## 2019-11-28 LAB — CANCER ANTIGEN 27.29: CA 27.29: 210.2 U/mL — ABNORMAL HIGH (ref 0.0–38.6)

## 2019-11-30 ENCOUNTER — Inpatient Hospital Stay (HOSPITAL_BASED_OUTPATIENT_CLINIC_OR_DEPARTMENT_OTHER): Payer: Federal, State, Local not specified - PPO | Admitting: Hematology and Oncology

## 2019-11-30 ENCOUNTER — Other Ambulatory Visit: Payer: Federal, State, Local not specified - PPO

## 2019-11-30 ENCOUNTER — Other Ambulatory Visit: Payer: Self-pay

## 2019-11-30 ENCOUNTER — Inpatient Hospital Stay: Payer: Federal, State, Local not specified - PPO

## 2019-11-30 ENCOUNTER — Encounter: Payer: Self-pay | Admitting: Hematology and Oncology

## 2019-11-30 ENCOUNTER — Other Ambulatory Visit: Payer: Self-pay | Admitting: Hematology and Oncology

## 2019-11-30 DIAGNOSIS — Z170 Estrogen receptor positive status [ER+]: Secondary | ICD-10-CM

## 2019-11-30 DIAGNOSIS — R978 Other abnormal tumor markers: Secondary | ICD-10-CM | POA: Diagnosis not present

## 2019-11-30 DIAGNOSIS — C50411 Malignant neoplasm of upper-outer quadrant of right female breast: Secondary | ICD-10-CM | POA: Diagnosis not present

## 2019-11-30 DIAGNOSIS — M816 Localized osteoporosis [Lequesne]: Secondary | ICD-10-CM

## 2019-11-30 LAB — CBC WITH DIFFERENTIAL/PLATELET
Abs Immature Granulocytes: 0.03 10*3/uL (ref 0.00–0.07)
Basophils Absolute: 0 10*3/uL (ref 0.0–0.1)
Basophils Relative: 0 %
Eosinophils Absolute: 0.1 10*3/uL (ref 0.0–0.5)
Eosinophils Relative: 1 %
HCT: 40.6 % (ref 36.0–46.0)
Hemoglobin: 13.1 g/dL (ref 12.0–15.0)
Immature Granulocytes: 0 %
Lymphocytes Relative: 20 %
Lymphs Abs: 1.8 10*3/uL (ref 0.7–4.0)
MCH: 30.1 pg (ref 26.0–34.0)
MCHC: 32.3 g/dL (ref 30.0–36.0)
MCV: 93.3 fL (ref 80.0–100.0)
Monocytes Absolute: 0.6 10*3/uL (ref 0.1–1.0)
Monocytes Relative: 6 %
Neutro Abs: 6.2 10*3/uL (ref 1.7–7.7)
Neutrophils Relative %: 73 %
Platelets: 238 10*3/uL (ref 150–400)
RBC: 4.35 MIL/uL (ref 3.87–5.11)
RDW: 13 % (ref 11.5–15.5)
WBC: 8.6 10*3/uL (ref 4.0–10.5)
nRBC: 0 % (ref 0.0–0.2)

## 2019-11-30 LAB — COMPREHENSIVE METABOLIC PANEL
ALT: 29 U/L (ref 0–44)
AST: 25 U/L (ref 15–41)
Albumin: 4 g/dL (ref 3.5–5.0)
Alkaline Phosphatase: 51 U/L (ref 38–126)
Anion gap: 9 (ref 5–15)
BUN: 23 mg/dL (ref 8–23)
CO2: 20 mmol/L — ABNORMAL LOW (ref 22–32)
Calcium: 8.8 mg/dL — ABNORMAL LOW (ref 8.9–10.3)
Chloride: 103 mmol/L (ref 98–111)
Creatinine, Ser: 0.56 mg/dL (ref 0.44–1.00)
GFR calc Af Amer: >60 mL/min (ref >60)
GFR calc non Af Amer: >60 mL/min (ref >60)
Glucose, Bld: 100 mg/dL — ABNORMAL HIGH (ref 70–99)
Potassium: 3.9 mmol/L (ref 3.5–5.1)
Sodium: 132 mmol/L — ABNORMAL LOW (ref 135–145)
Total Bilirubin: 0.8 mg/dL (ref 0.3–1.2)
Total Protein: 7.2 g/dL (ref 6.5–8.1)

## 2019-11-30 NOTE — Progress Notes (Signed)
No new changes noted today. The patient name and DOB has been verified by phone today. 

## 2019-12-01 ENCOUNTER — Encounter: Payer: Self-pay | Admitting: Hematology and Oncology

## 2019-12-01 DIAGNOSIS — R978 Other abnormal tumor markers: Secondary | ICD-10-CM | POA: Insufficient documentation

## 2019-12-01 LAB — CANCER ANTIGEN 15-3: CA 15-3: 60.6 U/mL — ABNORMAL HIGH (ref 0.0–25.0)

## 2019-12-01 LAB — CANCER ANTIGEN 27.29: CA 27.29: 214.7 U/mL — ABNORMAL HIGH (ref 0.0–38.6)

## 2019-12-07 ENCOUNTER — Ambulatory Visit
Admission: RE | Admit: 2019-12-07 | Discharge: 2019-12-07 | Disposition: A | Discharge status: Home / Self Care | Payer: Federal, State, Local not specified - PPO | Source: Ambulatory Visit | Attending: Hematology and Oncology | Admitting: Hematology and Oncology

## 2019-12-07 ENCOUNTER — Other Ambulatory Visit: Payer: Self-pay

## 2019-12-07 DIAGNOSIS — S2231XD Fracture of one rib, right side, subsequent encounter for fracture with routine healing: Secondary | ICD-10-CM | POA: Insufficient documentation

## 2019-12-07 DIAGNOSIS — K5730 Diverticulosis of large intestine without perforation or abscess without bleeding: Secondary | ICD-10-CM | POA: Diagnosis not present

## 2019-12-07 DIAGNOSIS — J984 Other disorders of lung: Secondary | ICD-10-CM | POA: Insufficient documentation

## 2019-12-07 DIAGNOSIS — R978 Other abnormal tumor markers: Secondary | ICD-10-CM | POA: Insufficient documentation

## 2019-12-07 DIAGNOSIS — Z170 Estrogen receptor positive status [ER+]: Secondary | ICD-10-CM

## 2019-12-07 DIAGNOSIS — C50411 Malignant neoplasm of upper-outer quadrant of right female breast: Secondary | ICD-10-CM | POA: Insufficient documentation

## 2019-12-07 DIAGNOSIS — R590 Localized enlarged lymph nodes: Secondary | ICD-10-CM | POA: Insufficient documentation

## 2019-12-07 DIAGNOSIS — R918 Other nonspecific abnormal finding of lung field: Secondary | ICD-10-CM | POA: Diagnosis not present

## 2019-12-07 LAB — GLUCOSE, CAPILLARY: Glucose-Capillary: 95 mg/dL (ref 70–99)

## 2019-12-07 MED ORDER — FLUDEOXYGLUCOSE F - 18 (FDG) INJECTION
9.1000 | Freq: Once | INTRAVENOUS | Status: AC | PRN
  Administered 2019-12-07: 9 via INTRAVENOUS

## 2019-12-07 NOTE — Progress Notes (Signed)
[facility]  [address], [address] [place], [place] [address] Phone: [phone]  Fax: [phone]   Telemedicine Office Visit:  12/09/2019  Referring physician: Birdie Sons, MD  I connected with Elizabeth [name] on 12/09/2019 at 12:01 PM by videoconferencing and verified that I was speaking with the correct person using 2 identifiers.  The patient was at home.  I discussed the limitations, risk, security and privacy concerns of performing an evaluation and management service by videoconferencing and the availability of in person appointments.  I also discussed with the patient that there may be a patient responsible charge related to this service.  The patient expressed understanding and agreed to proceed.   Chief Complaint: Elizabeth [name] is a 69 y.o. female with stage IAright breast canceron tamoxifen who is seen forreview of PET scan and discussion regarding direction of therapy.  HPI: The patient was last seen in the medical oncology clinic on 11/30/2019 via telemedicine. At that time, she denied any weight loss, bone pain, or abdominal symptoms. CA27.29 was 210.2 on 11/27/2019. She was on tamoxifen. She continued calcium and vitamin D.   Repeat labs on 11/30/2019 revealed hematocrit 40.6, hemoglobin 13.1, platelets 238,000, WBC 8,600. Sodium was 132. Calcium was 8.8. CA 27.29 was 214.7. CA 15-3 was 60.6.   Decision was made to pursue a PET scan.  PET scan on 12/07/2019 showed hypermetabolic paratracheal, right hilar, and subcarinal adenopathy, suspicious for active malignancy. There was a 1.5 x 0.9 cm right lower lobe nodule with irregular margins (SUV 2.3).  This was not definitive for malignancy and could be inflammatory, versus low-grade malignancy. The appearance would be atypical for metastatic breast cancer given the irregular margins, but could conceivably represent a low-grade primary lung adenocarcinoma. There were some  reticulonodular opacities in the right lower lobe likely from atypical infectious bronchiolitis.  There was bandlike density with small nodular components medially at the left lung base, which warrants surveillance.  This was not currently hypermetabolic but below sensitive PET-CT size thresholds. Other findings included aortic atherosclerosis, sigmoid diverticulosis and healing nondisplaced right anterior sixth rib fracture.  She received second COVID-19 vaccine on 12/08/2019.   During the interim, she has felt anxious about PET scan results. Patient had question about the results. Questions were answered. She denies any respiratory problems. She has not smoked in 7 years. She used to smoke 1 pack per day. In the past she quit smoking for 10 years. She started smoking around age 28. She has smoked for a total of 20 years.    Past Medical History:  Diagnosis Date  . Anginal pain ([place])   . Anxiety   . Constipation due to opioid therapy   . Degenerative joint disease (DJD) of hip   . GERD (gastroesophageal reflux disease)   . Osteopenia   . Restless leg syndrome    takes Mirapex and xanax  . Scoliosis   . SUI (stress urinary incontinence, female)   . Vaso-vagal reaction    after back surgery    Past Surgical History:  Procedure Laterality Date  . APPENDECTOMY  1963  . BACK SURGERY    . BREAST BIOPSY Right 04/27/2019   Affirm Bx "X" clip-INVASIVE MAMMARY CARCINOMA,  . BREAST CYST ASPIRATION Right 1988  . BREAST EXCISIONAL BIOPSY Right 1990   benign x 3  . BREAST LUMPECTOMY Right 05/08/2019  . BREAST LUMPECTOMY WITH SENTINEL LYMPH NODE BIOPSY Right 05/08/2019   Procedure: RIGHT BREAST LUMPECTOMY WITH SENTINEL LYMPH NODE BX AND  WIRE LOC., LATEX ALLERGY;  Surgeon: Jules Husbands, MD;  Location: [facility];  Service: General;  Laterality: Right;  . BREAST SURGERY    . CATARACT EXTRACTION W/PHACO Right 03/31/2018   Procedure: CATARACT EXTRACTION PHACO AND INTRAOCULAR LENS PLACEMENT ([place])  RIGHT;  Surgeon: Leandrew Koyanagi, MD;  Location: [place];  Service: Ophthalmology;  Laterality: Right;  Latex sensitivity  . CATARACT EXTRACTION W/PHACO Left 04/16/2018   Procedure: CATARACT EXTRACTION PHACO AND INTRAOCULAR LENS PLACEMENT ([place])  LEFT;  Surgeon: Leandrew Koyanagi, MD;  Location: [place];  Service: Ophthalmology;  Laterality: Left;  . EYE SURGERY     Lasik  . FOOT SURGERY Bilateral   . HYSTEROTOMY    . LAMINECTOMY  2006  . ROTATOR CUFF REPAIR Bilateral   . TONSILLECTOMY     age 60  . TOTAL HIP ARTHROPLASTY Right 01/18/2015   Procedure: RIGHT TOTAL HIP ARTHROPLASTY ANTERIOR APPROACH;  Surgeon: Renette Butters, MD;  Location: [place];  Service: Orthopedics;  Laterality: Right;  . TUBAL LIGATION      Family History  Problem Relation Age of Onset  . Alcohol abuse Mother   . Cancer Mother 51       lung  . Migraines Mother   . Cancer Father 6       prostate  . Diabetes Father   . Alcohol abuse Brother   . Cancer Brother        skin  . Cancer Daughter        breast  . Breast cancer Daughter 54  . Bladder Cancer Neg Hx   . Kidney cancer Neg Hx     Social History:  reports that she quit smoking about 7 years ago. Her smoking use included cigarettes. She has a 3.00 pack-year smoking history. She has never used smokeless tobacco. She reports current alcohol use of about 5.0 standard drinks of alcohol per week. She reports that she does not use drugs.  She has not smoked in 7 years. She used to smoke 1 pack per day. In the past she quit smoking for 10 years. She started smoking around age 59. She has smoked for a total of 20 years.  She denies any known exposure to radiation or toxins.   She is originally from [place], but lived in [place], [place] for many years and has been in [place] since 1999 after she got promoted working for [employer]. She is currently retired. She has several grandchildren and 10 great grandchildren.She lives in  [place]. The patient is alone today.  Participants in the patient's visit and their role in the encounter included the patient and Orlene Och, Therapist, sports, today.  The intake visit was provided by Orlene Och, RN.  Allergies:  Allergies  Allergen Reactions  . Zostavax [Zoster Vaccine Live] Rash  . Naproxen Hives  . Penicillins Hives    Did it involve swelling of the face/tongue/throat, SOB, or low BP? No Did it involve sudden or severe rash/hives, skin peeling, or any reaction on the inside of your mouth or nose? Yes Did you need to seek medical attention at a hospital or doctor's office? Yes When did it last happen?14 or 15 If all above answers are "NO", may proceed with cephalosporin use.   . Zoloft [Sertraline Hcl] Hives  . Latex Itching and Rash    Condoms    Current Medications: Current Outpatient Medications  Medication Sig Dispense Refill  . ALPRAZolam (XANAX) 0.5 MG tablet TAKE 3 TABLETS BY MOUTH  AT BEDTIME 90 tablet 5  . calcium-vitamin D (OSCAL WITH D) 500-200 MG-UNIT tablet Take 1 tablet by mouth daily.    . Multiple Vitamins-Minerals (MULTIVITAMIN WITH MINERALS) tablet Take 1 tablet by mouth 2 (two) times daily.     Marland Kitchen omeprazole (PRILOSEC) 20 MG capsule Take 1 capsule (20 mg total) by mouth daily. 90 capsule 4  . pramipexole (MIRAPEX) 1 MG tablet TAKE 1 TABLET(1 MG) BY MOUTH AT BEDTIME (Patient taking differently: Take 1 mg by mouth at bedtime. ) 30 tablet 12  . tamoxifen (NOLVADEX) 20 MG tablet Take 1 tablet (20 mg total) by mouth daily. 90 tablet 3  . clindamycin (CLEOCIN) 150 MG capsule Take 450 mg by mouth See admin instructions. Take before dental procedures    . nitroGLYCERIN (NITROSTAT) 0.4 MG SL tablet Place 1 tablet (0.4 mg total) under the tongue every 5 (five) minutes as needed for chest pain. Go to ER if third tablet is necessary (Patient not taking: Reported on 11/30/2019) 30 tablet 5  . ondansetron (ZOFRAN) 4 MG tablet Take 1 tablet (4 mg total) by  mouth every 8 (eight) hours as needed for nausea or vomiting. (Patient not taking: Reported on 11/30/2019) 20 tablet 0  . valACYclovir (VALTREX) 500 MG tablet Take 1 tablet (500 mg total) by mouth daily. Reported on 10/24/2015 (Patient not taking: Reported on 12/08/2019) 30 tablet 11   No current facility-administered medications for this visit.     Review of Systems  Constitutional: Negative.  Negative for chills, diaphoresis, fever, malaise/fatigue and weight loss.  HENT: Negative.  Negative for congestion, ear pain, hearing loss, nosebleeds, sinus pain and sore throat.   Eyes: Negative.  Negative for blurred vision and double vision.  Respiratory: Negative.  Negative for cough, sputum production, shortness of breath and wheezing.   Cardiovascular: Negative.  Negative for chest pain, palpitations, orthopnea, leg swelling and PND.  Gastrointestinal: Negative.  Negative for abdominal pain, blood in stool, constipation, diarrhea, heartburn (controlled with Prilosec), melena, nausea and vomiting.  Genitourinary: Negative.  Negative for dysuria, frequency, hematuria and urgency.  Musculoskeletal: Negative.  Negative for back pain, joint pain, myalgias and neck pain.  Skin: Negative.  Negative for itching and rash.  Neurological: Negative.  Negative for dizziness, tingling, sensory change, weakness and headaches.  Endo/Heme/Allergies: Negative.  Does not bruise/bleed easily.  Psychiatric/Behavioral: Negative for depression, memory loss and substance abuse. The patient is nervous/anxious (related to PET scan results). The patient does not have insomnia.   All other systems reviewed and are negative.   Performance status (ECOG): 1  Physical Exam  Constitutional: She is oriented to person, place, and time. She appears well-developed and well-nourished. No distress.  HENT:  Head: Normocephalic and atraumatic.  Long dark hair.   Eyes: Conjunctivae and EOM are normal. No scleral icterus.  Glasses.  Brown eyes.   Neurological: She is alert and oriented to person, place, and time.  Skin: She is not diaphoretic.  Psychiatric: She has a normal mood and affect. Her behavior is normal. Judgment and thought content normal.  Nursing note reviewed.   Hospital Outpatient Visit on 12/07/2019  Component Date Value Ref Range Status  . Glucose-Capillary 12/07/2019 95  70 - 99 mg/dL Final   Glucose reference range applies only to samples taken after fasting for at least 8 hours.    Assessment:  Elizabeth [name] is a 69 y.o. female withstage IAright breast cancers/plumpectomyand sentinel lymph node biopsy on 05/08/2019. Pathologyrevealed a 1.1 cm grade IIinvasive mammary carcinoma  with lobular features and adjacent DCIS. Margins were uninvolved. One lymph node was negative for malignancy.Tumor was ER + (> 90%), PR- (> 90%), and Her1/neu -.Pathologic stagewas pT1c pN0. CA27.29was 20.0 on 05/01/2019.  OncotypeDXtestingrevealed a score of 10, with a 3% chance of distant recurrence in 9 years with adjuvant endocrine therapy alone.Absolute chemotherapy benefit was<1%.  Right diagnostic mammogramon 04/22/2019 showed persistent architectural distortion in the right breast upper outer quadrant, posterior depth, which represents postsurgical scarring(scar marker).There was a separate asymmetry in the upper right breast, posterior depth, measuring 6-7 mm. There was no evidence of right axillary adenopathy.Ultrasound revealed no suspicious masses or shadowing lesions.  Bilateral breast MRIon 05/04/2019 revealed a 1.1 cm right breast malignancy in the lateral midportion of the right breast. There were associated biopsy change. The left breast was negative. There was no evidence for adenopathy. Clinical stagewas T1cN0.  She received breast radiationfrom 06/10/2019 - 07/29/2019.She begantamoxifenon 08/04/2019.  CA27.29 has been followed (0-38.6): 20.2 on  05/01/2019, 210.2 on 11/27/2019, and 214.7 on 11/30/2019.  CA15-3 was 60.6 (0-25) on 11/30/2019.  PET scan on 12/07/2019 showed hypermetabolic paratracheal, right hilar, and subcarinal adenopathy, suspicious for active malignancy. There was a 1.5 x 0.9 cm right lower lobe nodule with irregular margins (SUV 2.3).  This was not definitive for malignancy and could be inflammatory, versus low-grade malignancy. The appearance would be atypical for metastatic breast cancer given the irregular margins, but could conceivably represent a low-grade primary lung adenocarcinoma. There were some reticulonodular opacities in the right lower lobe likely from atypical infectious bronchiolitis.  There was bandlike density with small nodular components medially at the left lung base, which warrants surveillance.  This was not currently hypermetabolic but below sensitive PET-CT size thresholds. Other findings included aortic atherosclerosis, sigmoid diverticulosis and healing nondisplaced right anterior sixth rib fracture.  Bone density on09/13/2011 revealed osteopeniawitha T score of -2.0 in the forearm radius. Bone density on 04/09/2019 revealed osteoporosiswith a T-score of -2.5 inthe forearm radius.  She received her second COVID-19 vaccine on 12/08/2019.  Symptomatically, she denies any pain, shortness of breath or cough.  Plan: 1.   Review labs from 12/08/2019.  2. Stage IA right breast cancer Clinically, she is doing well.  She remains aymptomatic. She is s/p lumpectomy and SLN biopsy (05/08/2019). She completed radiation on 07/29/2019. She began tamoxifen on 08/04/2019.             CA27.29 has increased 10 fold since 04/2019.  PET scan on 12/07/2019 personally reviewed.  Agree with radiology interpretation.   She has hypermetabolic paratracheal, right hilar, and subcarinal adenopathy.    There is a 1.5 x 0.9 cm right lower lobe nodule with irregular  margins (SUV 2.3).   She has a 20 pack year smoking history.  Discuss referral to Dr Patsey Berthold for EBUS.  Discuss presentation at tumor board.  Multiple questions were asked and answered. 3.Osteoporosis She remains on calcium and vitamin D.. She is on tamoxifen as compared to an aromatase inhibitor.             Continue to monitor. 4.   Pulmonary consult (Dr Patsey Berthold). 5.   Tumor board tomorrow. 6.   MD to call patient after tumor board. 7.   RTC after bronchoscopy and biopsy for MD assessment and discussion regarding direction of therapy.   Addendum:  Tumor board recommended referral to pulmonary medicine for biopsy.  I discussed the assessment and treatment plan with the patient.  The patient was provided an opportunity to  ask questions and all were answered.  The patient agreed with the plan and demonstrated an understanding of the instructions.  The patient was advised to call back or seek an in person evaluation if the symptoms worsen or if the condition fails to improve as anticipated.  I provided 20 minutes (12:01 PM - 12:21 PM) of face-to-face video visit time during this this encounter and > 50% was spent counseling as documented under my assessment and plan.  I provided these services from the [facility] office.   Nolon Stalls, MD, PhD  12/09/2019, 12:01 PM  I, Selena Batten, am acting as scribe for [employer]. Mike Gip, MD, PhD.  I, Geoff Dacanay C. Mike Gip, MD, have reviewed the above documentation for accuracy and completeness, and I agree with the above.

## 2019-12-08 ENCOUNTER — Encounter: Payer: Self-pay | Admitting: Hematology and Oncology

## 2019-12-08 ENCOUNTER — Ambulatory Visit: Payer: Federal, State, Local not specified - PPO | Attending: Internal Medicine

## 2019-12-08 ENCOUNTER — Other Ambulatory Visit: Payer: Self-pay

## 2019-12-08 ENCOUNTER — Other Ambulatory Visit: Payer: Self-pay | Admitting: Hematology and Oncology

## 2019-12-08 DIAGNOSIS — Z23 Encounter for immunization: Secondary | ICD-10-CM

## 2019-12-08 DIAGNOSIS — C50411 Malignant neoplasm of upper-outer quadrant of right female breast: Secondary | ICD-10-CM

## 2019-12-08 DIAGNOSIS — Z170 Estrogen receptor positive status [ER+]: Secondary | ICD-10-CM

## 2019-12-08 NOTE — Progress Notes (Signed)
Confirmed patient name and DOB for phone assessment. Patient is just concerned about what the PET scan showed and is anxious to review it with Dr. Mike Gip

## 2019-12-08 NOTE — Progress Notes (Signed)
   Covid-19 Vaccination Clinic  Name:  Elizabeth [name]    MRN: [id number] DOB: 13-Dec-1950  12/08/2019  Elizabeth [name] was observed post Covid-19 immunization for 15 minutes without incident. She was provided with Vaccine Information Sheet and instruction to access the V-Safe system.   Elizabeth [name] was instructed to call 911 with any severe reactions post vaccine: Marland Kitchen Difficulty breathing  . Swelling of face and throat  . A fast heartbeat  . A bad rash all over body  . Dizziness and weakness   Immunizations Administered    Name Date Dose VIS Date Route   Pfizer COVID-19 Vaccine 12/08/2019  8:21 AM 0.3 mL 09/18/2019 Intramuscular   Manufacturer: [place]   Lot: [id number]   [place]: [id number]

## 2019-12-09 ENCOUNTER — Telehealth: Payer: Self-pay

## 2019-12-09 ENCOUNTER — Encounter: Payer: Self-pay | Admitting: Hematology and Oncology

## 2019-12-09 ENCOUNTER — Inpatient Hospital Stay
Payer: Federal, State, Local not specified - PPO | Attending: Hematology and Oncology | Admitting: Hematology and Oncology

## 2019-12-09 DIAGNOSIS — R911 Solitary pulmonary nodule: Secondary | ICD-10-CM

## 2019-12-09 DIAGNOSIS — C50411 Malignant neoplasm of upper-outer quadrant of right female breast: Secondary | ICD-10-CM

## 2019-12-09 DIAGNOSIS — Z170 Estrogen receptor positive status [ER+]: Secondary | ICD-10-CM

## 2019-12-09 DIAGNOSIS — R590 Localized enlarged lymph nodes: Secondary | ICD-10-CM

## 2019-12-09 NOTE — Telephone Encounter (Signed)
faxed new patient referral to Dr Patsey Berthold office [phone]. Fax conformation confirmed.

## 2019-12-10 ENCOUNTER — Other Ambulatory Visit: Payer: Federal, State, Local not specified - PPO

## 2019-12-10 NOTE — Progress Notes (Signed)
Tumor Board Documentation  Elizabeth [name] was presented by Dr Mike Gip at our Tumor Board on 12/10/2019, which included representatives from medical oncology, radiation oncology, navigation, pathology, radiology, surgical, surgical oncology, internal medicine, pharmacy, genetics, palliative care, research.  Elizabeth [name] currently presents as a current patient, for discussion with history of the following treatments: active survellience, surgical intervention(s).  Additionally, we reviewed previous medical and familial history, history of present illness, and recent lab results along with all available histopathologic and imaging studies. The tumor board considered available treatment options and made the following recommendations: Biopsy EBUS biopsy vs CT Guided Biopsy  The following procedures/referrals were also placed: No orders of the defined types were placed in this encounter.   Clinical Trial Status: not discussed   Staging used: Clinical Stage  AJCC Staging: T: 1c N: 0   Group: Stage 1 A invasive Mammory Carcinoma of Breast now with Lung Nodules   National site-specific guidelines   were discussed with respect to the case.  Tumor board is a meeting of clinicians from various specialty areas who evaluate and discuss patients for whom a multidisciplinary approach is being considered. Final determinations in the plan of care are those of the provider(s). The responsibility for follow up of recommendations given during tumor board is that of the provider.   Today's extended care, comprehensive team conference, Elizabeth [name] was not present for the discussion and was not examined.   Multidisciplinary Tumor Board is a multidisciplinary case peer review process.  Decisions discussed in the Multidisciplinary Tumor Board reflect the opinions of the specialists present at the conference without having examined the patient.  Ultimately, treatment and diagnostic decisions rest with the  primary provider(s) and the patient.

## 2019-12-15 ENCOUNTER — Other Ambulatory Visit: Payer: Self-pay

## 2019-12-15 ENCOUNTER — Encounter: Payer: Self-pay | Admitting: Hematology and Oncology

## 2019-12-15 ENCOUNTER — Ambulatory Visit: Payer: Federal, State, Local not specified - PPO | Admitting: Internal Medicine

## 2019-12-15 ENCOUNTER — Encounter: Payer: Self-pay | Admitting: Internal Medicine

## 2019-12-15 DIAGNOSIS — R590 Localized enlarged lymph nodes: Secondary | ICD-10-CM | POA: Diagnosis not present

## 2019-12-15 DIAGNOSIS — R911 Solitary pulmonary nodule: Secondary | ICD-10-CM | POA: Diagnosis not present

## 2019-12-15 NOTE — H&P (View-Only) (Signed)
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    From oncology notes: Oncotype DX testing revealed a score of 10, with a 3% chance of distant recurrence in 9 years with adjuvant endocrine therapy alone.  Absolute chemotherapy benefit was less than 1% but tumor markers have increased since surgery thus PET scan ordered.     History of Present Illness  12/15/2019  Pulmonary/ 1st office eval/Reinhold Rickey  Chief Complaint  Patient presents with  . Pulmonary Consult    Referred by Nolon Stalls for eval of abnormal CT Chest- mediastinal adenopathy and pulmonary nodule.   Dyspnea:  Not limited by breathing from desired activities   Cough: none Sleep: able to lie flat fine  SABA use:  5-6 weeks slipped in bath tub striking R chest wall /rib fx on PET with still residual pain but improving   No obvious day to day or daytime variability or assoc excess/ purulent sputum or mucus plugs or hemoptysis  or chest tightness, subjective wheeze or overt sinus or hb symptoms.   Sleeping  without nocturnal  or early am exacerbation  of respiratory  c/o's or need for noct saba. Also denies any obvious fluctuation of symptoms with weather or environmental changes or other aggravating or alleviating factors except as outlined above   No unusual exposure hx or h/o childhood pna/ asthma or knowledge of premature birth.  Current Allergies, Complete Past Medical History, Past Surgical History, Family History, and Social History were reviewed in [employer]  record.  ROS  The following are not active complaints unless bolded Hoarseness, sore throat, dysphagia, dental problems, itching, sneezing,  nasal congestion or discharge of excess mucus or purulent secretions, ear ache,   fever, chills, sweats, unintended wt loss or wt gain, classically pleuritic or exertional cp,  orthopnea pnd or arm/hand swelling  or leg swelling, presyncope, palpitations, abdominal pain, anorexia, nausea, vomiting, diarrhea  or change in bowel habits or change in bladder habits, change in stools or change in urine, dysuria, hematuria,  rash, arthralgias, visual complaints, headache, numbness, weakness or ataxia or problems with walking or coordination,  change in mood or  memory.           Past Medical History:  Diagnosis Date  . Anginal pain ([place])   . Anxiety   . Constipation due to opioid therapy   . Degenerative joint disease (DJD) of hip   . GERD (gastroesophageal reflux disease)   . Osteopenia   . Restless leg syndrome    takes Mirapex and xanax  . Scoliosis   . SUI (stress urinary incontinence, female)   . Vaso-vagal reaction    after back surgery    Outpatient Medications Prior to Visit  Medication Sig Dispense Refill  . ALPRAZolam (XANAX) 0.5 MG tablet TAKE 3 TABLETS BY MOUTH AT BEDTIME 90 tablet 5  . calcium-vitamin D (OSCAL WITH D) 500-200 MG-UNIT tablet Take 1 tablet by mouth daily.    . Multiple Vitamins-Minerals (MULTIVITAMIN WITH MINERALS) tablet  Take 1 tablet by mouth 2 (two) times daily.     . nitroGLYCERIN (NITROSTAT) 0.4 MG SL tablet Place 1 tablet (0.4 mg total) under the tongue every 5 (five) minutes as needed for chest pain. Go to ER if third tablet is necessary 30 tablet 5  . omeprazole (PRILOSEC) 20 MG capsule Take 1 capsule (20 mg total) by mouth daily. 90 capsule 4  . ondansetron (ZOFRAN) 4 MG tablet Take 1 tablet (4 mg total) by mouth every 8 (eight) hours as needed for nausea or vomiting. 20 tablet 0  . pramipexole (MIRAPEX) 1 MG tablet  TAKE 1 TABLET(1 MG) BY MOUTH AT BEDTIME (Patient taking differently: Take 1 mg by mouth at bedtime. ) 30 tablet 12  . tamoxifen (NOLVADEX) 20 MG tablet Take 1 tablet (20 mg total) by mouth daily. 90 tablet 3  . valACYclovir (VALTREX) 500 MG tablet Take 1 tablet (500 mg total) by mouth daily. Reported on 10/24/2015 30 tablet 11  . clindamycin (CLEOCIN) 150 MG capsule Take 450 mg by mouth See admin instructions. Take before dental procedures     No facility-administered medications prior to visit.     Objective:     BP 104/70 (BP Location: Left Arm, Cuff Size: Normal)   Pulse 74   Temp (!) 97.3 F (36.3 C) (Temporal)   Ht 5' 2" (1.575 m)   Wt 175 lb (79.4 kg)   SpO2 98% Comment: on RA  BMI 32.01 kg/m   SpO2: 98 %(on RA)   Pleasant amb wf nad   HEENT : pt wearing mask not removed for exam due to covid -19 concerns.    NECK :  without JVD/Nodes/TM/ nl carotid upstrokes bilaterally   LUNGS: no acc muscle use,  Nl contour chest which is clear to A and P bilaterally without cough on insp or exp maneuvers   CV:  RRR  no s3 or murmur or increase in P2, and no edema   ABD:  soft and nontender with nl inspiratory excursion in the supine position. No bruits or organomegaly appreciated, bowel sounds nl  MS:  Nl gait/ ext warm without deformities, calf tenderness, cyanosis or clubbing No obvious joint restrictions   SKIN: warm and dry without lesions    NEURO:  alert, approp, nl sensorium with  no motor or cerebellar deficits apparent.    PET 12/07/19  1. Hypermetabolic paratracheal, right hilar, and subcarinal adenopathy, suspicious for active malignancy. 2. 1.5 by 0.9 cm right lower lobe nodule with irregular margins observed, maximum SUV 2.3.  Labs:  Ca 27.29   11/27/19 = 210            Ca 27.29   11/30/19 = 215     Assessment   Right lower lobe pulmonary nodule Quit smoking 2013  -  PET 12/07/19  1.5 by 0.9 cm right lower lobe nodule with irregular margins observed,  maximum SUV 2.3  This would be unlikely a solitary met from PET but could be a slowly growing adenoca which freq is pet neg so will need to be observed for now but no need for immediate bx given the greater concern for the PET pos mediastinal nodes. Discussed with pt and Dr Windell Norfolk at length. See sep a/p for med adenopathy.    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 Hypermetabolic paratracheal, right hilar, and subcarinal adenopathy, suspicious for active malignancy.  Discussed ddx with pt - unusual pattern for breast ca and she does have a lung nodule that is not PET pos with smoking hx so it's possible this is lung cancer but key here is tissue dx so will rec proceed with EBUS asap - first slot available will be with Dr Lamonte Sakai March 16.  Discussed in detail all the  indications, usual  risks and alternatives  relative to the benefits with patient who agrees to proceed with w/u as outlined.            Each maintenance medication was reviewed in detail including emphasizing most importantly the difference between maintenance and prns and under what circumstances the prns are to be triggered using an action plan format where appropriate.  Total time for H and P, extensive chart review, counseling and generating customized AVS unique to this initial office visit / charting =  60 min           Christinia Gully, MD 12/15/2019

## 2019-12-15 NOTE — Progress Notes (Signed)
 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    From oncology notes: Oncotype DX testing revealed a score of 10, with a 3% chance of distant recurrence in 9 years with adjuvant endocrine therapy alone.  Absolute chemotherapy benefit was less than 1% but tumor markers have increased since surgery thus PET scan ordered.     History of Present Illness  12/15/2019  Pulmonary/ 1st office eval/[place]  Chief Complaint  Patient presents with  . Pulmonary Consult    Referred by Melissa Corcoran for eval of abnormal CT Chest- mediastinal adenopathy and pulmonary nodule.   Dyspnea:  Not limited by breathing from desired activities   Cough: none Sleep: able to lie flat fine  SABA use:  5-6 weeks slipped in bath tub striking R chest wall /rib fx on PET with still residual pain but improving   No obvious day to day or daytime variability or assoc excess/ purulent sputum or mucus plugs or hemoptysis  or chest tightness, subjective wheeze or overt sinus or hb symptoms.   Sleeping  without nocturnal  or early am exacerbation  of respiratory  c/o's or need for noct saba. Also denies any obvious fluctuation of symptoms with weather or environmental changes or other aggravating or alleviating factors except as outlined above   No unusual exposure hx or h/o childhood pna/ asthma or knowledge of premature birth.  Current Allergies, Complete Past Medical History, Past Surgical History, Family History, and Social History were reviewed in [facility] electronic medical  record.  ROS  The following are not active complaints unless bolded Hoarseness, sore throat, dysphagia, dental problems, itching, sneezing,  nasal congestion or discharge of excess mucus or purulent secretions, ear ache,   fever, chills, sweats, unintended wt loss or wt gain, classically pleuritic or exertional cp,  orthopnea pnd or arm/hand swelling  or leg swelling, presyncope, palpitations, abdominal pain, anorexia, nausea, vomiting, diarrhea  or change in bowel habits or change in bladder habits, change in stools or change in urine, dysuria, hematuria,  rash, arthralgias, visual complaints, headache, numbness, weakness or ataxia or problems with walking or coordination,  change in mood or  memory.           Past Medical History:  Diagnosis Date  . Anginal pain ([facility])   . Anxiety   . Constipation due to opioid therapy   . Degenerative joint disease (DJD) of hip   . GERD (gastroesophageal reflux disease)   . Osteopenia   . Restless leg syndrome    takes Mirapex and xanax  . Scoliosis   . SUI (stress urinary incontinence, female)   . Vaso-vagal reaction    after back surgery    Outpatient Medications Prior to Visit  Medication Sig Dispense Refill  . ALPRAZolam (XANAX) 0.5 MG tablet TAKE 3 TABLETS BY MOUTH AT BEDTIME 90 tablet 5  . calcium-vitamin D (OSCAL WITH D) 500-200 MG-UNIT tablet Take 1 tablet by mouth daily.    . Multiple Vitamins-Minerals (MULTIVITAMIN WITH MINERALS) tablet   Take 1 tablet by mouth 2 (two) times daily.     . nitroGLYCERIN (NITROSTAT) 0.4 MG SL tablet Place 1 tablet (0.4 mg total) under the tongue every 5 (five) minutes as needed for chest pain. Go to ER if third tablet is necessary 30 tablet 5  . omeprazole (PRILOSEC) 20 MG capsule Take 1 capsule (20 mg total) by mouth daily. 90 capsule 4  . ondansetron (ZOFRAN) 4 MG tablet Take 1 tablet (4 mg total) by mouth every 8 (eight) hours as needed for nausea or vomiting. 20 tablet 0  . pramipexole (MIRAPEX) 1 MG tablet  TAKE 1 TABLET(1 MG) BY MOUTH AT BEDTIME (Patient taking differently: Take 1 mg by mouth at bedtime. ) 30 tablet 12  . tamoxifen (NOLVADEX) 20 MG tablet Take 1 tablet (20 mg total) by mouth daily. 90 tablet 3  . valACYclovir (VALTREX) 500 MG tablet Take 1 tablet (500 mg total) by mouth daily. Reported on 10/24/2015 30 tablet 11  . clindamycin (CLEOCIN) 150 MG capsule Take 450 mg by mouth See admin instructions. Take before dental procedures     No facility-administered medications prior to visit.     Objective:     BP 104/70 (BP Location: Left Arm, Cuff Size: Normal)   Pulse 74   Temp (!) 97.3 F (36.3 C) (Temporal)   Ht 5' 2" (1.575 m)   Wt 175 lb (79.4 kg)   SpO2 98% Comment: on RA  BMI 32.01 kg/m   SpO2: 98 %(on RA)   Pleasant amb wf nad   HEENT : pt wearing mask not removed for exam due to covid -19 concerns.    NECK :  without JVD/Nodes/TM/ nl carotid upstrokes bilaterally   LUNGS: no acc muscle use,  Nl contour chest which is clear to A and P bilaterally without cough on insp or exp maneuvers   CV:  RRR  no s3 or murmur or increase in P2, and no edema   ABD:  soft and nontender with nl inspiratory excursion in the supine position. No bruits or organomegaly appreciated, bowel sounds nl  MS:  Nl gait/ ext warm without deformities, calf tenderness, cyanosis or clubbing No obvious joint restrictions   SKIN: warm and dry without lesions    NEURO:  alert, approp, nl sensorium with  no motor or cerebellar deficits apparent.    PET 12/07/19  1. Hypermetabolic paratracheal, right hilar, and subcarinal adenopathy, suspicious for active malignancy. 2. 1.5 by 0.9 cm right lower lobe nodule with irregular margins observed, maximum SUV 2.3.  Labs:  Ca 27.29   11/27/19 = 210            Ca 27.29   11/30/19 = 215     Assessment   Right lower lobe pulmonary nodule Quit smoking 2013  -  PET 12/07/19  1.5 by 0.9 cm right lower lobe nodule with irregular margins observed,  maximum SUV 2.3  This would be unlikely a solitary met from PET but could be a slowly growing adenoca which freq is pet neg so will need to be observed for now but no need for immediate bx given the greater concern for the PET pos mediastinal nodes. Discussed with pt and Dr Windell Norfolk at length. See sep a/p for med adenopathy.    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 Hypermetabolic paratracheal, right hilar, and subcarinal adenopathy, suspicious for active malignancy.  Discussed ddx with pt - unusual pattern for breast ca and she does have a lung nodule that is not PET pos with smoking hx so it's possible this is lung cancer but key here is tissue dx so will rec proceed with EBUS asap - first slot available will be with Dr Lamonte Sakai March 16.  Discussed in detail all the  indications, usual  risks and alternatives  relative to the benefits with patient who agrees to proceed with w/u as outlined.            Each maintenance medication was reviewed in detail including emphasizing most importantly the difference between maintenance and prns and under what circumstances the prns are to be triggered using an action plan format where appropriate.  Total time for H and P, extensive chart review, counseling and generating customized AVS unique to this initial office visit / charting =  60 min           Christinia Gully, MD 12/15/2019

## 2019-12-15 NOTE — Patient Instructions (Signed)
I will call you with recommendation for who will best to do the biopsy with the least risk

## 2019-12-16 ENCOUNTER — Telehealth: Payer: Self-pay | Admitting: *Deleted

## 2019-12-16 ENCOUNTER — Encounter: Payer: Self-pay | Admitting: Internal Medicine

## 2019-12-16 DIAGNOSIS — C50411 Malignant neoplasm of upper-outer quadrant of right female breast: Secondary | ICD-10-CM

## 2019-12-16 DIAGNOSIS — Z170 Estrogen receptor positive status [ER+]: Secondary | ICD-10-CM

## 2019-12-16 NOTE — Telephone Encounter (Signed)
Order placed per message below, will route to [facility] pool to help get this EBUS scheduled with Dr. Lamonte Sakai

## 2019-12-16 NOTE — Assessment & Plan Note (Signed)
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 Hypermetabolic paratracheal, right hilar, and subcarinal adenopathy, suspicious for active malignancy.  Discussed ddx with pt - unusual pattern for breast ca and she does have a lung nodule that is not PET pos with smoking hx so it's possible this is lung cancer but key here is tissue dx so will rec proceed with EBUS asap - first slot available will be with Dr Lamonte Sakai March 16.  Discussed in detail all the  indications, usual  risks and alternatives  relative to the benefits with patient who agrees to proceed with w/u as outlined.             Each maintenance medication was reviewed in detail including emphasizing most importantly the difference between maintenance and prns and under what circumstances the prns are to be triggered using an action plan format where appropriate.         Each maintenance medication was reviewed in detail including emphasizing most importantly the difference between maintenance and prns and under what circumstances the prns are to be triggered using an action plan format where appropriate.  Total time for H and P, extensive chart review, counseling and generating customized AVS unique to this initial office visit / charting =  60 min

## 2019-12-16 NOTE — Assessment & Plan Note (Signed)
Quit smoking 2013  -  PET 12/07/19  1.5 by 0.9 cm right lower lobe nodule with irregular margins observed, maximum SUV 2.3  This would be unlikely a solitary met from PET but could be a slowly growing adenoca which freq is pet neg so will need to be observed for now but no need for immediate bx given the greater concern for the PET pos mediastinal nodes. Discussed with pt and Dr Windell Norfolk at length. See sep a/p for med adenopathy.

## 2019-12-16 NOTE — Telephone Encounter (Signed)
[email or web address]  [facility] 12/22/19@7 :30am covid test 12/19/19@[facility]@11 :20 do I need to call this pt? Joellen Jersey

## 2019-12-16 NOTE — Telephone Encounter (Signed)
-----   Message from Garner Nash, DO sent at 12/15/2019  5:36 PM EST ----- Regarding: EBUS on 12/22/2019 Aayan Haskew,   Please schedule on 16th on March. H/o breast ca, pet avid 4r and 7.   EBUS with Rob Byrum.   Thanks Ronalee Belts for the referral.   Leroy Sea

## 2019-12-17 NOTE — Telephone Encounter (Signed)
Thanks for setting up. Yes please call her to let her know.

## 2019-12-18 NOTE — Telephone Encounter (Signed)
Pt has been notified Elizabeth [name]

## 2019-12-19 ENCOUNTER — Other Ambulatory Visit (HOSPITAL_COMMUNITY)
Admission: RE | Admit: 2019-12-19 | Discharge: 2019-12-19 | Disposition: A | Discharge status: Home / Self Care | Payer: Federal, State, Local not specified - PPO | Source: Ambulatory Visit | Attending: Emergency Medicine | Admitting: Emergency Medicine

## 2019-12-19 DIAGNOSIS — Z01812 Encounter for preprocedural laboratory examination: Secondary | ICD-10-CM | POA: Insufficient documentation

## 2019-12-19 DIAGNOSIS — Z20822 Contact with and (suspected) exposure to covid-19: Secondary | ICD-10-CM | POA: Insufficient documentation

## 2019-12-19 LAB — SARS CORONAVIRUS 2 (TAT 6-24 HRS): SARS Coronavirus 2: NEGATIVE

## 2019-12-21 ENCOUNTER — Other Ambulatory Visit: Payer: Self-pay

## 2019-12-21 ENCOUNTER — Encounter (HOSPITAL_COMMUNITY): Payer: Self-pay | Admitting: Emergency Medicine

## 2019-12-21 NOTE — Progress Notes (Signed)
Anesthesia Chart Review:  Pt is a same day work up    Case: [id number] Date/Time: 12/22/19 0730   Procedure: VIDEO BRONCHOSCOPY WITH ENDOBRONCHIAL ULTRASOUND (N/A )   Anesthesia type: General   Pre-op diagnosis: LUNG MASS   Location: [facility] ROOM 2 / [facility]   Surgeons: Collene Gobble, MD      DISCUSSION:   Pt is a 69 year old with hx anginal pain (work-up 2017-2018 did not identify any cardiac disease), scoliosis, breast cancer (s/p lumpectomy & radiation). Former smoker.    PROVIDERS: - PCP is Birdie Sons, MD - Was evaluated by cardiologist Elsie Stain at [facility] in 2017-2018 for chest pain (notes in care everywhere).  All cardiac testing unremarkable including CTA of thorax to check for aortic disease.  Pt no longer sees cardiology   LABS: Will be obtained day of surgery    IMAGES:  CT angiogram of thorax 01/02/17 (care everywhere):  - Indication: back and chest pain 1.Nonspecific nodular pulmonary opacities measuring up to approximately 1cm as noted above.If there are no old outside exams document long term stability, suggest follow-up CT chest in 3 months to document stability/resolution. 2. Negative for aortic dissection in the imaged portion of the aorta.  3. Negative for acute pulmonary thromboemboli. 4. Additional incidental and/or chronic findings as above.   EKG: Will be obtained day of surgery    CV:  Nuclear stress test 11/04/15:  - Myocardial perfusion imaging is normal - Summed severity score is normal.  - Overall LV systolic function normal without regional wall motion abnormalities   Past Medical History:  Diagnosis Date  . Anginal pain ([place])   . Anxiety   . Cancer [facility])    breast cancer (radical lumpectomy and radiation  . Complication of anesthesia    one time woke when she had a tube down throat and had a panic attack  . Constipation due to opioid therapy   . Degenerative joint disease (DJD) of hip   . GERD (gastroesophageal reflux  disease)   . Headache    migraines when she was working  . Osteopenia   . Pneumonia   . Restless leg syndrome    takes Mirapex and xanax  . Scoliosis   . SUI (stress urinary incontinence, female)   . Vaso-vagal reaction    after back surgery    Past Surgical History:  Procedure Laterality Date  . APPENDECTOMY  1963  . BACK SURGERY    . BREAST BIOPSY Right 04/27/2019   Affirm Bx "X" clip-INVASIVE MAMMARY CARCINOMA,  . BREAST CYST ASPIRATION Right 1988  . BREAST EXCISIONAL BIOPSY Right 1990   benign x 3  . BREAST LUMPECTOMY Right 05/08/2019  . BREAST LUMPECTOMY WITH SENTINEL LYMPH NODE BIOPSY Right 05/08/2019   Procedure: RIGHT BREAST LUMPECTOMY WITH SENTINEL LYMPH NODE BX AND WIRE LOC., LATEX ALLERGY;  Surgeon: Jules Husbands, MD;  Location: [facility] ORS;  Service: General;  Laterality: Right;  . BREAST SURGERY    . CATARACT EXTRACTION W/PHACO Right 03/31/2018   Procedure: CATARACT EXTRACTION PHACO AND INTRAOCULAR LENS PLACEMENT ([place]) RIGHT;  Surgeon: Leandrew Koyanagi, MD;  Location: [place];  Service: Ophthalmology;  Laterality: Right;  Latex sensitivity  . CATARACT EXTRACTION W/PHACO Left 04/16/2018   Procedure: CATARACT EXTRACTION PHACO AND INTRAOCULAR LENS PLACEMENT ([place])  LEFT;  Surgeon: Leandrew Koyanagi, MD;  Location: [place];  Service: Ophthalmology;  Laterality: Left;  . EYE SURGERY     Lasik  . FOOT SURGERY Bilateral   .  HYSTEROTOMY    . LAMINECTOMY  2006  . ROTATOR CUFF REPAIR Bilateral   . TONSILLECTOMY     age 75  . TOTAL HIP ARTHROPLASTY Right 01/18/2015   Procedure: RIGHT TOTAL HIP ARTHROPLASTY ANTERIOR APPROACH;  Surgeon: Renette Butters, MD;  Location: [place];  Service: Orthopedics;  Laterality: Right;  . TUBAL LIGATION      MEDICATIONS: No current facility-administered medications for this encounter.   Marland Kitchen ALPRAZolam (XANAX) 0.5 MG tablet  . calcium-vitamin D (OSCAL WITH D) 500-200 MG-UNIT tablet  . Multiple Vitamins-Minerals  (MULTIVITAMIN WITH MINERALS) tablet  . nitroGLYCERIN (NITROSTAT) 0.4 MG SL tablet  . omeprazole (PRILOSEC) 20 MG capsule  . ondansetron (ZOFRAN) 4 MG tablet  . pramipexole (MIRAPEX) 1 MG tablet  . tamoxifen (NOLVADEX) 20 MG tablet  . valACYclovir (VALTREX) 500 MG tablet  . clindamycin (CLEOCIN) 150 MG capsule    If labs acceptable day of surgery, I anticipate pt can proceed with surgery as scheduled.  Willeen Cass, FNP-BC [facility]/Anesthesiology Phone: [phone] 12/21/2019 1:09 PM

## 2019-12-21 NOTE — Anesthesia Preprocedure Evaluation (Addendum)
Anesthesia Evaluation  Patient identified by MRN, date of birth, ID band Patient awake    Reviewed: Allergy & Precautions, NPO status , Patient's Chart, lab work & pertinent test results  History of Anesthesia Complications (+) AWARENESS UNDER ANESTHESIA and history of anesthetic complications (awareness of being intubated, likely during emergence)  Airway Mallampati: II  TM Distance: >3 FB Neck ROM: Full    Dental  (+) Teeth Intact   Pulmonary former smoker,  Lung mass   Pulmonary exam normal        Cardiovascular negative cardio ROS Normal cardiovascular exam  Was evaluated by cardiologist Elsie Stain at [facility] in 2017-2018 for chest pain (notes in care everywhere).  All cardiac testing unremarkable including CTA of thorax to check for aortic disease.  Pt no longer sees cardiology  Was evaluated by cardiologist Elsie Stain at [facility] in 2017-2018 for chest pain (notes in care everywhere).  All cardiac testing unremarkable including CTA of thorax to check for aortic disease.  Pt no longer sees cardiology   Neuro/Psych PSYCHIATRIC DISORDERS Anxiety Depression negative neurological ROS     GI/Hepatic Neg liver ROS, GERD  ,  Endo/Other  negative endocrine ROS  Renal/GU negative Renal ROS  negative genitourinary   Musculoskeletal negative musculoskeletal ROS (+)   Abdominal   Peds  Hematology negative hematology ROS (+)   Anesthesia Other Findings Breast cancer s/p lumpectomy/XRT  Reproductive/Obstetrics                           Anesthesia Physical Anesthesia Plan  ASA: II  Anesthesia Plan: General   Post-op Pain Management:    Induction: Intravenous  PONV Risk Score and Plan: 3 and Ondansetron, Dexamethasone, Treatment may vary due to age or medical condition and Midazolam  Airway Management Planned: Oral ETT  Additional Equipment: None  Intra-op Plan:   Post-operative Plan: Extubation  in OR  Informed Consent: I have reviewed the patients History and Physical, chart, labs and discussed the procedure including the risks, benefits and alternatives for the proposed anesthesia with the patient or authorized representative who has indicated his/her understanding and acceptance.     Dental advisory given  Plan Discussed with:   Anesthesia Plan Comments: (See APP note by Durel Salts, FNP)      Anesthesia Quick Evaluation

## 2019-12-21 NOTE — Progress Notes (Signed)
Spoke with pt for pre-op call. Pt states she was seen by a cardiologist at [facility] for chest pain when taking deep breath a few years ago. Had a stress test done in 2017. She was given NTG to use as needed. She states she has used it in the past, but not for a while. Last office visit was in 2018 with Dr. Laurance Flatten and she states she was told to follow up as needed.  Pt denies any recent chest pain or sob.  Covid test done on 12/19/19 and it is negative. Pt states she is in quarantine and understands that she stays in quarantine until she comes to the hospital tomorrow.

## 2019-12-22 ENCOUNTER — Ambulatory Visit (HOSPITAL_COMMUNITY): Payer: Federal, State, Local not specified - PPO | Admitting: Emergency Medicine

## 2019-12-22 ENCOUNTER — Encounter (HOSPITAL_COMMUNITY): Payer: Self-pay | Admitting: Emergency Medicine

## 2019-12-22 ENCOUNTER — Other Ambulatory Visit: Payer: Self-pay | Admitting: Anatomic Pathology & Clinical Pathology

## 2019-12-22 ENCOUNTER — Other Ambulatory Visit: Payer: Self-pay

## 2019-12-22 ENCOUNTER — Ambulatory Visit (HOSPITAL_COMMUNITY)
Admission: RE | Admit: 2019-12-22 | Discharge: 2019-12-22 | Disposition: A | Discharge status: Home / Self Care | Payer: Federal, State, Local not specified - PPO | Attending: Emergency Medicine | Admitting: Emergency Medicine

## 2019-12-22 ENCOUNTER — Encounter (HOSPITAL_COMMUNITY)
Admission: RE | Disposition: A | Discharge status: Home / Self Care | Payer: Self-pay | Source: Home / Self Care | Attending: Emergency Medicine

## 2019-12-22 DIAGNOSIS — G2581 Restless legs syndrome: Secondary | ICD-10-CM | POA: Diagnosis not present

## 2019-12-22 DIAGNOSIS — Z853 Personal history of malignant neoplasm of breast: Secondary | ICD-10-CM | POA: Insufficient documentation

## 2019-12-22 DIAGNOSIS — M419 Scoliosis, unspecified: Secondary | ICD-10-CM | POA: Diagnosis not present

## 2019-12-22 DIAGNOSIS — Z79899 Other long term (current) drug therapy: Secondary | ICD-10-CM | POA: Diagnosis not present

## 2019-12-22 DIAGNOSIS — C781 Secondary malignant neoplasm of mediastinum: Secondary | ICD-10-CM | POA: Insufficient documentation

## 2019-12-22 DIAGNOSIS — F419 Anxiety disorder, unspecified: Secondary | ICD-10-CM

## 2019-12-22 DIAGNOSIS — C969 Malignant neoplasm of lymphoid, hematopoietic and related tissue, unspecified: Secondary | ICD-10-CM | POA: Insufficient documentation

## 2019-12-22 DIAGNOSIS — Z87891 Personal history of nicotine dependence: Secondary | ICD-10-CM | POA: Insufficient documentation

## 2019-12-22 DIAGNOSIS — R590 Localized enlarged lymph nodes: Secondary | ICD-10-CM

## 2019-12-22 DIAGNOSIS — F329 Major depressive disorder, single episode, unspecified: Secondary | ICD-10-CM | POA: Diagnosis not present

## 2019-12-22 DIAGNOSIS — M1610 Unilateral primary osteoarthritis, unspecified hip: Secondary | ICD-10-CM | POA: Insufficient documentation

## 2019-12-22 DIAGNOSIS — K219 Gastro-esophageal reflux disease without esophagitis: Secondary | ICD-10-CM | POA: Diagnosis not present

## 2019-12-22 DIAGNOSIS — M8580 Other specified disorders of bone density and structure, unspecified site: Secondary | ICD-10-CM | POA: Diagnosis not present

## 2019-12-22 HISTORY — DX: Other complications of anesthesia, initial encounter: T88.59XA

## 2019-12-22 HISTORY — PX: VIDEO BRONCHOSCOPY WITH ENDOBRONCHIAL ULTRASOUND: SHX6177

## 2019-12-22 HISTORY — DX: Headache, unspecified: R51.9

## 2019-12-22 HISTORY — DX: Pneumonia, unspecified organism: J18.9

## 2019-12-22 HISTORY — PX: FINE NEEDLE ASPIRATION: SHX5430

## 2019-12-22 LAB — PROTIME-INR
INR: 0.9 (ref 0.8–1.2)
Prothrombin Time: 12.5 seconds (ref 11.4–15.2)

## 2019-12-22 LAB — APTT: aPTT: 23 seconds — ABNORMAL LOW (ref 24–36)

## 2019-12-22 SURGERY — BRONCHOSCOPY, WITH EBUS
Anesthesia: General

## 2019-12-22 MED ORDER — MIDAZOLAM HCL 5 MG/5ML IJ SOLN
INTRAMUSCULAR | Status: DC | PRN
  Administered 2019-12-22: 2 mg via INTRAVENOUS

## 2019-12-22 MED ORDER — DEXAMETHASONE SODIUM PHOSPHATE 10 MG/ML IJ SOLN
INTRAMUSCULAR | Status: DC | PRN
  Administered 2019-12-22: 10 mg via INTRAVENOUS

## 2019-12-22 MED ORDER — SUGAMMADEX SODIUM 200 MG/2ML IV SOLN
INTRAVENOUS | Status: DC | PRN
  Administered 2019-12-22: 160 mg via INTRAVENOUS

## 2019-12-22 MED ORDER — ONDANSETRON HCL 4 MG/2ML IJ SOLN
INTRAMUSCULAR | Status: DC | PRN
  Administered 2019-12-22: 4 mg via INTRAVENOUS

## 2019-12-22 MED ORDER — ROCURONIUM BROMIDE 50 MG/5ML IV SOSY
PREFILLED_SYRINGE | INTRAVENOUS | Status: DC | PRN
  Administered 2019-12-22: 80 mg via INTRAVENOUS

## 2019-12-22 MED ORDER — ALPRAZOLAM 0.5 MG PO TABS: 1.5000 mg | ORAL_TABLET | Freq: Every day | ORAL | Status: DC

## 2019-12-22 MED ORDER — LIDOCAINE 2% (20 MG/ML) 5 ML SYRINGE
INTRAMUSCULAR | Status: DC | PRN
  Administered 2019-12-22: 100 mg via INTRAVENOUS

## 2019-12-22 MED ORDER — PROPOFOL 10 MG/ML IV BOLUS
INTRAVENOUS | Status: DC | PRN
  Administered 2019-12-22 (×4): 30 mg via INTRAVENOUS
  Administered 2019-12-22: 200 mg via INTRAVENOUS
  Administered 2019-12-22: 10 mg via INTRAVENOUS
  Administered 2019-12-22: 70 mg via INTRAVENOUS

## 2019-12-22 MED ORDER — VALACYCLOVIR HCL 500 MG PO TABS: 500.0000 mg | ORAL_TABLET | Freq: Every day | ORAL | Status: AC | PRN

## 2019-12-22 MED ORDER — LACTATED RINGERS IV SOLN: INTRAVENOUS | Status: DC | PRN

## 2019-12-22 MED ORDER — FENTANYL CITRATE (PF) 100 MCG/2ML IJ SOLN
INTRAMUSCULAR | Status: DC | PRN
  Administered 2019-12-22: 100 ug via INTRAVENOUS

## 2019-12-22 NOTE — Discharge Instructions (Signed)
Flexible Bronchoscopy, Care After This sheet gives you information about how to care for yourself after your test. Your doctor may also give you more specific instructions. If you have problems or questions, contact your doctor. Follow these instructions at home: Eating and drinking  Do not eat or drink anything (not even water) for 2 hours after your test, or until your numbing medicine (local anesthetic) wears off.  When your numbness is gone and your cough and gag reflexes have come back, you may: ? Eat only soft foods. ? Slowly drink liquids.  The day after the test, go back to your normal diet. Driving  Do not drive for 24 hours if you were given a medicine to help you relax (sedative).  Do not drive or use heavy machinery while taking prescription pain medicine. General instructions   Take over-the-counter and prescription medicines only as told by your doctor.  Return to your normal activities as told. Ask what activities are safe for you.  Do not use any products that have nicotine or tobacco in them. This includes cigarettes and e-cigarettes. If you need help quitting, ask your doctor.  Keep all follow-up visits as told by your doctor. This is important. It is very important if you had a tissue sample (biopsy) taken. Get help right away if:  You have shortness of breath that gets worse.  You get light-headed.  You feel like you are going to pass out (faint).  You have chest pain.  You cough up: ? More than a little blood. ? More blood than before. Summary  Do not eat or drink anything (not even water) for 2 hours after your test, or until your numbing medicine wears off.  Do not use cigarettes. Do not use e-cigarettes.  Get help right away if you have chest pain.  Please call our office for any questions or concerns. [phone].  This information is not intended to replace advice given to you by your health care provider. Make sure you discuss any  questions you have with your health care provider. Document Revised: 09/06/2017 Document Reviewed: 10/12/2016 Elsevier Patient Education  2020 [employer].

## 2019-12-22 NOTE — Interval H&P Note (Signed)
History and Physical Interval Note:  12/22/2019 7:27 AM  Elizabeth [name]  has presented today for surgery, with the diagnosis of LUNG MASS.  The various methods of treatment have been discussed with the patient. After consideration of risks, benefits and other options for treatment, the patient has consented to  Procedure(s): [place] (N/A) as a surgical intervention.  The patient's history has been reviewed, patient examined, no change in status, stable for surgery.  I have reviewed the patient's chart and labs.  Questions were answered to the patient's satisfaction.     Collene Gobble

## 2019-12-22 NOTE — Transfer of Care (Signed)
Immediate Anesthesia Transfer of Care Note  Patient: Elizabeth [name]  Procedure(s) Performed: VIDEO BRONCHOSCOPY WITH ENDOBRONCHIAL ULTRASOUND (N/A ) FINE NEEDLE ASPIRATION (FNA) LINEAR  Patient Location: PACU  Anesthesia Type:General  Level of Consciousness: awake, oriented and patient cooperative  Airway & Oxygen Therapy: Patient Spontanous Breathing and Patient connected to face mask oxygen  Post-op Assessment: Report given to RN and Post -op Vital signs reviewed and stable  Post vital signs: Reviewed and stable  Last Vitals:  Vitals Value Taken Time  BP 142/70 12/22/19 0900  Temp    Pulse 81 12/22/19 0901  Resp 21 12/22/19 0901  SpO2 100 % 12/22/19 0901  Vitals shown include unvalidated device data.  Last Pain:  Vitals:   12/22/19 0649  TempSrc:   PainSc: 0-No pain         Complications: No apparent anesthesia complications

## 2019-12-22 NOTE — Anesthesia Postprocedure Evaluation (Signed)
Anesthesia Post Note  Patient: Elizabeth [name]  Procedure(s) Performed: VIDEO BRONCHOSCOPY WITH ENDOBRONCHIAL ULTRASOUND (N/A ) FINE NEEDLE ASPIRATION (FNA) LINEAR     Patient location during evaluation: Endoscopy Anesthesia Type: General Level of consciousness: awake and alert Pain management: pain level controlled Vital Signs Assessment: post-procedure vital signs reviewed and stable Respiratory status: spontaneous breathing, nonlabored ventilation and respiratory function stable Cardiovascular status: blood pressure returned to baseline and stable Postop Assessment: no apparent nausea or vomiting Anesthetic complications: no    Last Vitals:  Vitals:   12/22/19 0900 12/22/19 0915  BP: (!) 142/70 (!) 146/84  Pulse: 87 81  Resp: 17 20  Temp: (!) 36.2 C (!) 36.3 C  SpO2: 100% 98%    Last Pain:  Vitals:   12/22/19 0915  TempSrc:   PainSc: 0-No pain                 Lidia Collum

## 2019-12-22 NOTE — Anesthesia Procedure Notes (Addendum)
Procedure Name: Intubation Date/Time: 12/22/2019 7:42 AM Performed by: Lidia Collum, MD Pre-anesthesia Checklist: Patient identified, Emergency Drugs available, Suction available and Patient being monitored Patient Re-evaluated:Patient Re-evaluated prior to induction Oxygen Delivery Method: Circle system utilized Preoxygenation: Pre-oxygenation with 100% oxygen Induction Type: IV induction Ventilation: Mask ventilation without difficulty and Oral airway inserted - appropriate to patient size Laryngoscope Size: Glidescope and 3 Grade View: Grade I Tube type: Oral Tube size: 8.0 mm Number of attempts: 2 (2 attempts w/ mac blade grade II. 1 w/ glide. ) Airway Equipment and Method: Oral airway,  Bougie stylet and Video-laryngoscopy Placement Confirmation: ETT inserted through vocal cords under direct vision,  positive ETCO2 and breath sounds checked- equal and bilateral Secured at: 22 cm Tube secured with: Tape Dental Injury: Teeth and Oropharynx as per pre-operative assessment  Comments: Mac 3 unsuccessful, attempt with glidescope 3 successful using bougie stylet by MD

## 2019-12-22 NOTE — Op Note (Signed)
Video Bronchoscopy with Endobronchial Ultrasound Procedure Note  Date of Operation: 12/22/2019  Pre-op Diagnosis: Mediastinal lymphadenopathy  Post-op Diagnosis: Same  Surgeon: Baltazar Apo  Assistants: None  Anesthesia: General endotracheal anesthesia  Operation: Flexible video fiberoptic bronchoscopy with endobronchial ultrasound and biopsies.  Estimated Blood Loss: 30 cc  Complications: None apparent  Indications and History: Elizabeth [name] is a 69 y.o. female with history of treated breast cancer. She was found to have hypermetabolic mediastinal lymphadenopathy on PET scan. Recommendation was made to achieve a tissue diagnosis via bronchoscopy with endobronchial ultrasound and biopsies.  The risks, benefits, complications, treatment options and expected outcomes were discussed with the patient.  The possibilities of pneumothorax, pneumonia, reaction to medication, pulmonary aspiration, perforation of a viscus, bleeding, failure to diagnose a condition and creating a complication requiring transfusion or operation were discussed with the patient who freely signed the consent.    Description of Procedure: The patient was examined in the preoperative area and history and data from the preprocedure consultation were reviewed. It was deemed appropriate to proceed.  The patient was taken to [facility] endoscopy room 2, identified as [name] and the procedure verified as Flexible Video Fiberoptic Bronchoscopy.  A Time Out was held and the above information confirmed. After being taken to the operating room general anesthesia was initiated and the patient  was orally intubated. The video fiberoptic bronchoscope was introduced via the endotracheal tube and a general inspection was performed which showed normal airways throughout. There were no endobronchial lesions or abnormal secretions. The standard scope was then withdrawn and the endobronchial ultrasound was used  to identify and characterize the peritracheal, hilar and bronchial lymph nodes. Inspection showed enlarged nodes at station 4R, 10 R. There was an irregular enlarged vascular node/mass in the subcarinal region station 7. Using real-time ultrasound guidance Wang needle biopsies were take from Station 4R, 7, 10R nodes and were sent for cytology. The patient tolerated the procedure well without apparent complications. There was no significant blood loss. The bronchoscope was withdrawn. Anesthesia was reversed and the patient was taken to the PACU for recovery.   Samples: 1. Wang needle biopsies from 4R node 2. Wang needle biopsies from 7 node 3. Wang needle biopsies from 10R node   Plans:  The patient will be discharged from the PACU to home when recovered from anesthesia. We will review the cytology, pathology and microbiology results with the patient when they become available. Outpatient followup will be with Dr. Lamonte Sakai or Dr. Melvyn Novas.    Baltazar Apo, MD, PhD 12/22/2019, 8:54 AM [facility] [phone] or if no answer [phone]

## 2019-12-24 ENCOUNTER — Other Ambulatory Visit: Payer: Self-pay | Admitting: Hematology and Oncology

## 2019-12-24 ENCOUNTER — Telehealth: Payer: Self-pay | Admitting: Emergency Medicine

## 2019-12-24 DIAGNOSIS — C3490 Malignant neoplasm of unspecified part of unspecified bronchus or lung: Secondary | ICD-10-CM

## 2019-12-24 DIAGNOSIS — R911 Solitary pulmonary nodule: Secondary | ICD-10-CM

## 2019-12-24 LAB — CYTOLOGY - NON PAP

## 2019-12-24 NOTE — Telephone Encounter (Signed)
Please cancel patient's appointment with MW 3/19. I already discussed results with her. Thanks.

## 2019-12-24 NOTE — Telephone Encounter (Signed)
Cancelled appointment Let patient know it has been cancelled Nothing further needed at this time

## 2019-12-24 NOTE — Telephone Encounter (Signed)
Spoke with the patient, discussed results with her > carcinoma that stained most like a primary lung cancer although she does not have a significant mass on CT or PET just a right lower lobe pulmonary nodule.  She wants to get her subsequent care at [facility] and I will make a referral to the [place].

## 2019-12-25 ENCOUNTER — Encounter: Payer: Self-pay | Admitting: *Deleted

## 2019-12-25 ENCOUNTER — Ambulatory Visit: Payer: Federal, State, Local not specified - PPO | Admitting: Internal Medicine

## 2019-12-25 DIAGNOSIS — R590 Localized enlarged lymph nodes: Secondary | ICD-10-CM

## 2019-12-25 NOTE — Progress Notes (Signed)
Oncology Nurse Navigator Documentation  Oncology Nurse Navigator Flowsheets 12/25/2019  Abnormal Finding Date -  Confirmed Diagnosis Date -  Diagnosis Status -  Navigation Complete Date: -  Navigator Location [facility]  Referral Date to RadOnc/MedOnc 12/25/2019  Navigator Encounter Type Telephone/I received referral today on Elizabeth [name].  I called and clarified if she wanted to be seen here or have follow up at [facility].  Patient would like to be seen here. I gave her an appt. She verbalized understanding of appt time and place.   Telephone Outgoing Call  Treatment Phase Pre-Tx/Tx Discussion  Barriers/Navigation Needs Coordination of Care;Education  Education Other  Interventions Coordination of Care;Education  Acuity Level 2-Minimal Needs (1-2 Barriers Identified)  Coordination of Care Appts;Other  Education Method Verbal  Time Spent with Patient 30

## 2019-12-29 ENCOUNTER — Other Ambulatory Visit: Payer: Self-pay | Admitting: Family Medicine

## 2019-12-29 DIAGNOSIS — G2581 Restless legs syndrome: Secondary | ICD-10-CM

## 2019-12-29 NOTE — Telephone Encounter (Signed)
Requested medication (s) are due for refill today: Yes  Requested medication (s) are on the active medication list: Yes  Last refill:  12/17/18  Future visit scheduled: No  Notes to clinic:  Prescription has expired.    Requested Prescriptions  Pending Prescriptions Disp Refills   pramipexole (MIRAPEX) 1 MG tablet [Pharmacy Med Name: PRAMIPEXOLE DIHYDROCHLORIDE 1 MG TA] 30 tablet 12    Sig: TAKE 1 TABLET BY MOUTH AT BEDTIME      Neurology:  Parkinsonian Agents Failed - 12/29/2019  3:50 PM      Failed - Last BP in normal range    BP Readings from Last 1 Encounters:  12/22/19 (!) 146/84          Passed - Valid encounter within last 12 months    Recent Outpatient Visits           10 months ago Annual physical exam   [facility] Birdie Sons, MD   1 year ago Lower abdominal pain   [facility] Birdie Sons, MD   1 year ago Sinusitis, unspecified chronicity, unspecified location   [facility] Birdie Sons, MD   1 year ago Inflamed internal hemorrhoid   [place], Vickki Muff, [place]   2 years ago [place], Kirstie Peri, MD

## 2019-12-31 ENCOUNTER — Encounter: Payer: Self-pay | Admitting: *Deleted

## 2019-12-31 ENCOUNTER — Inpatient Hospital Stay: Payer: Federal, State, Local not specified - PPO | Attending: Internal Medicine

## 2019-12-31 ENCOUNTER — Inpatient Hospital Stay (HOSPITAL_BASED_OUTPATIENT_CLINIC_OR_DEPARTMENT_OTHER): Payer: Federal, State, Local not specified - PPO | Admitting: Internal Medicine

## 2019-12-31 ENCOUNTER — Other Ambulatory Visit: Payer: Self-pay | Admitting: *Deleted

## 2019-12-31 ENCOUNTER — Other Ambulatory Visit: Payer: Self-pay

## 2019-12-31 ENCOUNTER — Other Ambulatory Visit: Payer: Federal, State, Local not specified - PPO

## 2019-12-31 ENCOUNTER — Encounter: Payer: Self-pay | Admitting: Internal Medicine

## 2019-12-31 ENCOUNTER — Encounter: Payer: Self-pay | Admitting: Hematology and Oncology

## 2019-12-31 VITALS — BP 132/69 | HR 73 | Temp 98.3°F | Resp 18 | Ht 62.0 in | Wt 178.3 lb

## 2019-12-31 DIAGNOSIS — Z923 Personal history of irradiation: Secondary | ICD-10-CM | POA: Insufficient documentation

## 2019-12-31 DIAGNOSIS — Z170 Estrogen receptor positive status [ER+]: Secondary | ICD-10-CM | POA: Diagnosis not present

## 2019-12-31 DIAGNOSIS — Z79811 Long term (current) use of aromatase inhibitors: Secondary | ICD-10-CM | POA: Diagnosis not present

## 2019-12-31 DIAGNOSIS — C3490 Malignant neoplasm of unspecified part of unspecified bronchus or lung: Secondary | ICD-10-CM

## 2019-12-31 DIAGNOSIS — R590 Localized enlarged lymph nodes: Secondary | ICD-10-CM

## 2019-12-31 DIAGNOSIS — C50411 Malignant neoplasm of upper-outer quadrant of right female breast: Secondary | ICD-10-CM | POA: Diagnosis not present

## 2019-12-31 DIAGNOSIS — C3491 Malignant neoplasm of unspecified part of right bronchus or lung: Secondary | ICD-10-CM

## 2019-12-31 LAB — CMP (CANCER CENTER ONLY)
ALT: 29 U/L (ref 0–44)
AST: 23 U/L (ref 15–41)
Albumin: 3.8 g/dL (ref 3.5–5.0)
Alkaline Phosphatase: 60 U/L (ref 38–126)
Anion gap: 9 (ref 5–15)
BUN: 11 mg/dL (ref 8–23)
CO2: 27 mmol/L (ref 22–32)
Calcium: 8.9 mg/dL (ref 8.9–10.3)
Chloride: 104 mmol/L (ref 98–111)
Creatinine: 0.69 mg/dL (ref 0.44–1.00)
GFR, Est AFR Am: >60 mL/min (ref >60)
GFR, Estimated: >60 mL/min (ref >60)
Glucose, Bld: 99 mg/dL (ref 70–99)
Potassium: 4.4 mmol/L (ref 3.5–5.1)
Sodium: 140 mmol/L (ref 135–145)
Total Bilirubin: 0.4 mg/dL (ref 0.3–1.2)
Total Protein: 6.6 g/dL (ref 6.5–8.1)

## 2019-12-31 LAB — CBC WITH DIFFERENTIAL (CANCER CENTER ONLY)
Abs Immature Granulocytes: 0.03 10*3/uL (ref 0.00–0.07)
Basophils Absolute: 0 10*3/uL (ref 0.0–0.1)
Basophils Relative: 0 %
Eosinophils Absolute: 0.2 10*3/uL (ref 0.0–0.5)
Eosinophils Relative: 2 %
HCT: 38.9 % (ref 36.0–46.0)
Hemoglobin: 12.6 g/dL (ref 12.0–15.0)
Immature Granulocytes: 0 %
Lymphocytes Relative: 20 %
Lymphs Abs: 1.9 10*3/uL (ref 0.7–4.0)
MCH: 30.5 pg (ref 26.0–34.0)
MCHC: 32.4 g/dL (ref 30.0–36.0)
MCV: 94.2 fL (ref 80.0–100.0)
Monocytes Absolute: 0.5 10*3/uL (ref 0.1–1.0)
Monocytes Relative: 6 %
Neutro Abs: 6.5 10*3/uL (ref 1.7–7.7)
Neutrophils Relative %: 72 %
Platelet Count: 257 10*3/uL (ref 150–400)
RBC: 4.13 MIL/uL (ref 3.87–5.11)
RDW: 12.3 % (ref 11.5–15.5)
WBC Count: 9.1 10*3/uL (ref 4.0–10.5)
nRBC: 0 % (ref 0.0–0.2)

## 2019-12-31 NOTE — Progress Notes (Deleted)
The patient Name and DOB has been verified by phone today. The patient does c/o constipation but better with prum juice.

## 2019-12-31 NOTE — Progress Notes (Signed)
Oncology Nurse Navigator Documentation  Oncology Nurse Navigator Flowsheets 12/31/2019  Abnormal Finding Date -  Confirmed Diagnosis Date 12/22/2019  Diagnosis Status Pending Molecular Studies  Planned Course of Treatment Chemo/Radiation Concurrent  Phase of Treatment Chemo/Radiation Concurrent  Navigator Follow Up Date: 01/04/2020  Navigator Follow Up Reason: Appointment Review  Navigation Complete Date: -  Navigator Location [facility]  Referral Date to RadOnc/MedOnc -  Navigator Encounter Type Clinic/MDC/I spoke with Ms. Koble today at clinic.  She is newly dx lung cancer. I gave and explained information on lung cancer and treatment plan.  I will update Rad Onc that she is being referred.    Telephone -  [facility] Date 12/31/2019  Multidisiplinary Clinic Type Thoracic  Patient Visit Type MedOnc  Treatment Phase Pre-Tx/Tx Discussion  Barriers/Navigation Needs Education  Education Newly Diagnosed Cancer Education;Understanding Cancer/ Treatment Options;Other  Interventions Education;Psycho-Social Support  Acuity Level 2-Minimal Needs (1-2 Barriers Identified)  Coordination of Care -  Education Method Verbal;Written  Time Spent with Patient 30

## 2019-12-31 NOTE — Progress Notes (Signed)
The proposed treatment discussed in cancer conference 12/31/19 is for discussion purpose only and is not a binding recommendation.  The patient was not physically examined nor present for their treatment options.  Therefore, final treatment plans cannot be decided.

## 2019-12-31 NOTE — Progress Notes (Signed)
[place] Telephone:([phone]   [phone]) [phone]  CONSULT NOTE  REFERRING PHYSICIAN: Dr. Baltazar Apo  REASON FOR CONSULTATION:  69 years old white female recently diagnosed with lung cancer.  HPI Elizabeth [name] is a 69 y.o. female with past medical history significant for anxiety, GERD, osteopenia, restless leg syndrome, scoliosis as well as history of right breast cancer diagnosed in July 2020 status post lumpectomy followed by radiotherapy and she is currently on treatment with Femara.  The patient was followed by medical oncology at [facility] and she was noted to have persistently elevated CA 27.29 tumor marker.  She had a PET scan on 12/07/2019 and it showed a 1.5 x 0.9 cm right lower lobe nodule with irregular margins with maximum SUV of 2.3 that is not definitive for malignancy and could be inflammatory versus low-grade malignancy.  She had hypermetabolic paratracheal, right hilar and subcarinal adenopathy suspicious for active malignancy.  The patient was referred to Dr. Melvyn Novas and letter to Dr. Lamonte Sakai.  She underwent flexible video fiberoptic bronchoscopy with endobronchial ultrasound and biopsies under the care of Dr. Lamonte Sakai on 12/22/2019.  The final pathology ([id number]) from the 10R, 4R as well as 7 lymph nodes showed malignant cells consistent with metastatic lung adenocarcinoma. The malignant cells are positive for TTF-1 and Napsin-A. They are negative for GCDFP and GATA. The findings are consistent with metastatic lung adenocarcinoma.  For some reason the patient declined to continue her care at the [place] center and requested to transfer her care to [facility].  She was referred by Dr. Lamonte Sakai to me today for evaluation and recommendation regarding treatment of her recently diagnosed lung cancer. When seen today she has no complaints except for constipation.  She denied having any recent chest pain, shortness of breath, cough or  hemoptysis.  She denied having any nausea, vomiting or diarrhea.  She has no weight loss or night sweats.  She has no headache or visual changes. Family history significant for mother with lung cancer at age 61.  Father had prostate cancer at age 46.  Brother had skin cancer at age 47 and daughter had breast cancer at age 39. The patient is married and has 3 children and 47 grandchildrens.  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  She has a history of smoking for around 20 years and quit 8 years ago.  She drinks a couple of alcoholic drinks on daily basis and no history of drug abuse.  HPI  Past Medical History:  Diagnosis Date  . Anginal pain ([place])   . Anxiety   . Cancer [facility])    breast cancer (radical lumpectomy and radiation  . Complication of anesthesia    one time woke when she had a tube down throat and had a panic attack  . Constipation due to opioid therapy   . Degenerative joint disease (DJD) of hip   . GERD (gastroesophageal reflux disease)   . Headache    migraines when she was working  . Osteopenia   . Pneumonia   . Restless leg syndrome    takes Mirapex and xanax  . Scoliosis   . SUI (stress urinary incontinence, female)   . Vaso-vagal reaction    after back surgery    Past Surgical History:  Procedure Laterality Date  . APPENDECTOMY  1963  . BACK SURGERY    . BREAST BIOPSY Right 04/27/2019   Affirm Bx "X"  clip-INVASIVE MAMMARY CARCINOMA,  . BREAST CYST ASPIRATION Right 1988  . BREAST EXCISIONAL BIOPSY Right 1990   benign x 3  . BREAST LUMPECTOMY Right 05/08/2019  . BREAST LUMPECTOMY WITH SENTINEL LYMPH NODE BIOPSY Right 05/08/2019   Procedure: RIGHT BREAST LUMPECTOMY WITH SENTINEL LYMPH NODE BX AND WIRE LOC., LATEX ALLERGY;  Surgeon: Jules Husbands, MD;  Location: [facility];  Service: General;  Laterality: Right;  . BREAST SURGERY    . CATARACT EXTRACTION W/PHACO Right 03/31/2018   Procedure: CATARACT  EXTRACTION PHACO AND INTRAOCULAR LENS PLACEMENT ([place]) RIGHT;  Surgeon: Leandrew Koyanagi, MD;  Location: [place];  Service: Ophthalmology;  Laterality: Right;  Latex sensitivity  . CATARACT EXTRACTION W/PHACO Left 04/16/2018   Procedure: CATARACT EXTRACTION PHACO AND INTRAOCULAR LENS PLACEMENT ([place])  LEFT;  Surgeon: Leandrew Koyanagi, MD;  Location: [place];  Service: Ophthalmology;  Laterality: Left;  . EYE SURGERY     Lasik  . FINE NEEDLE ASPIRATION  12/22/2019   Procedure: FINE NEEDLE ASPIRATION (FNA) LINEAR;  Surgeon: Collene Gobble, MD;  Location: [place] [facility];  Service: Pulmonary;;  . FOOT SURGERY Bilateral   . HYSTEROTOMY    . LAMINECTOMY  2006  . ROTATOR CUFF REPAIR Bilateral   . TONSILLECTOMY     age 12  . TOTAL HIP ARTHROPLASTY Right 01/18/2015   Procedure: RIGHT TOTAL HIP ARTHROPLASTY ANTERIOR APPROACH;  Surgeon: Renette Butters, MD;  Location: [place];  Service: Orthopedics;  Laterality: Right;  . TUBAL LIGATION    . VIDEO BRONCHOSCOPY WITH ENDOBRONCHIAL ULTRASOUND N/A 12/22/2019   Procedure: VIDEO BRONCHOSCOPY WITH ENDOBRONCHIAL ULTRASOUND;  Surgeon: Collene Gobble, MD;  Location: [facility];  Service: Pulmonary;  Laterality: N/A;    Family History  Problem Relation Age of Onset  . Alcohol abuse Mother   . Cancer Mother 50       lung  . Migraines Mother   . Cancer Father 77       prostate  . Diabetes Father   . Alcohol abuse Brother   . Cancer Brother        skin  . Cancer Daughter        breast  . Breast cancer Daughter 32  . Bladder Cancer Neg Hx   . Kidney cancer Neg Hx     Social History Social History   Tobacco Use  . Smoking status: Former Smoker    Packs/day: 0.50    Years: 20.00    Pack years: 10.00    Types: Cigarettes    Quit date: 01/07/2012    Years since quitting: 7.9  . Smokeless tobacco: Never Used  Substance Use Topics  . Alcohol use: Yes    Alcohol/week: 5.0 standard drinks    Types: 5 Glasses of wine per  week    Comment: red wine  . Drug use: No    Allergies  Allergen Reactions  . Zostavax [Zoster Vaccine Live] Rash  . Naproxen Hives  . Penicillins Hives    Did it involve swelling of the face/tongue/throat, SOB, or low BP? No Did it involve sudden or severe rash/hives, skin peeling, or any reaction on the inside of your mouth or nose? Yes Did you need to seek medical attention at a hospital or doctor's office? Yes When did it last happen?14 or 15 If all above answers are "NO", may proceed with cephalosporin use.   . Zoloft [Sertraline Hcl] Hives  . Latex Itching and Rash    Condoms    Current Outpatient  Medications  Medication Sig Dispense Refill  . ALPRAZolam (XANAX) 0.5 MG tablet Take 3 tablets (1.5 mg total) by mouth at bedtime.    . calcium-vitamin D (OSCAL WITH D) 500-200 MG-UNIT tablet Take 1 tablet by mouth daily.    . Multiple Vitamins-Minerals (MULTIVITAMIN WITH MINERALS) tablet Take 1 tablet by mouth 2 (two) times daily.     Marland Kitchen omeprazole (PRILOSEC) 20 MG capsule Take 1 capsule (20 mg total) by mouth daily. 90 capsule 4  . pramipexole (MIRAPEX) 1 MG tablet TAKE 1 TABLET BY MOUTH AT BEDTIME 30 tablet 12  . tamoxifen (NOLVADEX) 20 MG tablet Take 1 tablet (20 mg total) by mouth daily. 90 tablet 3  . valACYclovir (VALTREX) 500 MG tablet Take 1 tablet (500 mg total) by mouth daily as needed (Cold sores).    . clindamycin (CLEOCIN) 150 MG capsule Take 450 mg by mouth See admin instructions. Take before dental procedures    . nitroGLYCERIN (NITROSTAT) 0.4 MG SL tablet Place 1 tablet (0.4 mg total) under the tongue every 5 (five) minutes as needed for chest pain. Go to ER if third tablet is necessary (Patient not taking: Reported on 12/31/2019) 30 tablet 5  . ondansetron (ZOFRAN) 4 MG tablet Take 1 tablet (4 mg total) by mouth every 8 (eight) hours as needed for nausea or vomiting. (Patient not taking: Reported on 12/31/2019) 20 tablet 0   No current facility-administered  medications for this visit.    Review of Systems  Constitutional: negative Eyes: negative Ears, nose, mouth, throat, and face: negative Respiratory: negative Cardiovascular: negative Gastrointestinal: positive for constipation Genitourinary:negative Integument/breast: negative Hematologic/lymphatic: negative Musculoskeletal:negative Neurological: negative Behavioral/Psych: negative Endocrine: negative Allergic/Immunologic: negative  Physical Exam  XBJ:YNWGN, healthy, no distress, well nourished, well developed and anxious SKIN: skin color, texture, turgor are normal, no rashes or significant lesions HEAD: Normocephalic, No masses, lesions, tenderness or abnormalities EYES: normal, PERRLA, Conjunctiva are pink and non-injected EARS: External ears normal, Canals clear OROPHARYNX:no exudate, no erythema and lips, buccal mucosa, and tongue normal  NECK: supple, no adenopathy, no JVD LYMPH:  no palpable lymphadenopathy, no hepatosplenomegaly BREAST:not examined LUNGS: clear to auscultation , and palpation HEART: regular rate & rhythm, no murmurs and no gallops ABDOMEN:abdomen soft, non-tender, normal bowel sounds and no masses or organomegaly BACK: Back symmetric, no curvature., No CVA tenderness EXTREMITIES:no joint deformities, effusion, or inflammation, no edema  NEURO: alert & oriented x 3 with fluent speech, no focal motor/sensory deficits  PERFORMANCE STATUS: ECOG 1  LABORATORY DATA: Lab Results  Component Value Date   WBC 9.1 12/31/2019   HGB 12.6 12/31/2019   HCT 38.9 12/31/2019   MCV 94.2 12/31/2019   PLT 257 12/31/2019      Chemistry      Component Value Date/Time   NA 132 (L) 11/30/2019 1503   NA 140 02/25/2019 0955   K 3.9 11/30/2019 1503   CL 103 11/30/2019 1503   CO2 20 (L) 11/30/2019 1503   BUN 23 11/30/2019 1503   BUN 16 02/25/2019 0955   CREATININE 0.56 11/30/2019 1503   CREATININE 0.70 09/10/2017 0848   GLU 91 09/28/2014 0000      Component  Value Date/Time   CALCIUM 8.8 (L) 11/30/2019 1503   ALKPHOS 51 11/30/2019 1503   AST 25 11/30/2019 1503   ALT 29 11/30/2019 1503   BILITOT 0.8 11/30/2019 1503   BILITOT 0.5 02/25/2019 0955       RADIOGRAPHIC STUDIES: NM PET Image Initial (PI) Skull Base To Thigh  Result Date: 12/07/2019 CLINICAL DATA:  Subsequent treatment strategy for breast cancer. EXAM: NUCLEAR MEDICINE PET SKULL BASE TO THIGH TECHNIQUE: 9.0 mCi F-18 FDG was injected intravenously. Full-ring PET imaging was performed from the skull base to thigh after the radiotracer. CT data was obtained and used for attenuation correction and anatomic localization. Fasting blood glucose: 95 mg/dl COMPARISON:  Breast MRI from 05/04/2019 FINDINGS: Mediastinal blood pool activity: SUV max 2.8 Liver activity: SUV max NA NECK: No significant abnormal hypermetabolic activity in this region. Incidental CT findings: none CHEST: Hypermetabolic paratracheal, right hilar, and subcarinal adenopathy observed. A lower right paratracheal node measuring 1.6 cm in short axis on image 84/3 has maximum SUV of 7.8. Indistinctly marginated right hilar adenopathy observed, maximum SUV 5.6. A subcarinal lymph node measuring 1.2 cm in short axis has a maximum SUV of 6.3. 1.5 by 0.9 cm right lower lobe nodule with irregular margins on image 113/3 observed, maximum SUV 2.3. Reticulonodular opacities anteriorly in the right lower lobe are most likely from atypical infectious bronchiolitis. Bandlike nodularity medially along the left lung base with nodular components measuring up to 0.5 cm in short axis on image 125/3, but no definite accentuated metabolic activity. Incidental CT findings: No significant abnormal hypermetabolic activity in this region. ABDOMEN/PELVIS: Photopenic lesion in the lateral segment left hepatic lobe, probably a cyst. Incidental CT findings: Aortoiliac atherosclerotic vascular disease. Vascular calcification along the left renal hilum. Sigmoid  diverticulosis. SKELETON: Subtle accentuated activity anteriorly in the right sixth rib is believed to be associated with a nondisplaced rib fracture and is probably benign Incidental CT findings: Thoracolumbar scoliosis. Right hip prosthesis. IMPRESSION: 1. Hypermetabolic paratracheal, right hilar, and subcarinal adenopathy, suspicious for active malignancy. 2. 1.5 by 0.9 cm right lower lobe nodule with irregular margins observed, maximum SUV 2.3. This is not definitive for malignancy and could be inflammatory, versus low-grade malignancy. The appearance would be atypical for metastatic breast cancer given the irregular margins, but could conceivably represent a low-grade primary lung adenocarcinoma. There also some reticulonodular opacities in the right lower lobe likely from atypical infectious bronchiolitis. 3. Bandlike density with small nodular components medially at the left lung base, warrants surveillance. Not currently hypermetabolic but below sensitive PET-CT size thresholds. 4. Other imaging findings of potential clinical significance: Aortic Atherosclerosis (ICD10-I70.0). Sigmoid diverticulosis. Healing nondisplaced right anterior sixth rib fracture. Electronically Signed   By: Van Clines M.D.   On: 12/07/2019 12:52    ASSESSMENT: This is a very pleasant 69 years old white female with history of a stage I (T1c, N0, M0 right breast adenocarcinoma with positive ER/PR status post right lumpectomy followed by adjuvant radiotherapy and currently on treatment with hormonal therapy with Femara. The patient is recently diagnosed with stage IIIa (T1b, N2, M0) non-small cell lung cancer, adenocarcinoma presented with right lower lobe lung nodule in addition to right hilar and mediastinal lymphadenopathy diagnosed in March 2021.   PLAN: I had a lengthy discussion with the patient today about her current disease stage, prognosis and treatment options. I recommended for the patient to complete the  staging work-up by ordering MRI of the brain to rule out brain metastasis. I will request her tissue block or blood sample to be sent for molecular studies if there is insufficient material. I discussed with the patient her treatment options and recommended for her course of concurrent chemoradiation with weekly carboplatin for AUC of 2 and paclitaxel 45 NG/M2.  I discussed with the patient the adverse effect of this treatment including but  not limited to alopecia, myelosuppression, nausea and vomiting, peripheral neuropathy, liver or renal dysfunction. After completion of the course of concurrent chemoradiation, the patient may benefit from consolidation treatment with immunotherapy with Imfinzi every 4 weeks if she has no evidence for disease progression after the induction phase. I will arrange for the patient to have a chemotherapy education class before the first dose of her treatment. I will call her pharmacy with prescription for Compazine 10 mg p.o. every 6 hours as needed for nausea. For the history of breast cancer, the patient will continue her current treatment with Femara.  The patient will come back for follow-up visit with the first day of her treatment on 01/11/2020. She was advised to call immediately if she has any concerning symptoms in the interval. The patient voices understanding of current disease status and treatment options and is in agreement with the current care plan.  All questions were answered. The patient knows to call the clinic with any problems, questions or concerns. We can certainly see the patient much sooner if necessary.  Thank you so much for allowing me to participate in the care of Elizabeth [name]. I will continue to follow up the patient with you and assist in her care.  The total time spent in the appointment was 80 minutes.  Disclaimer: This note was dictated with voice recognition software. Similar sounding words can inadvertently be transcribed  and may not be corrected upon review.   Eilleen Kempf December 31, 2019, 1:50 PM

## 2019-12-31 NOTE — Progress Notes (Signed)
START ON PATHWAY REGIMEN - Non-Small Cell Lung     Administer weekly:     Paclitaxel      Carboplatin   **Always confirm dose/schedule in your pharmacy ordering system**  Patient Characteristics: Preoperative or Nonsurgical Candidate (Clinical Staging), Stage III - Nonsurgical Candidate, PS = 0, 1 Therapeutic Status: Preoperative or Nonsurgical Candidate (Clinical Staging) AJCC T Category: cT1a AJCC N Category: cN2 AJCC M Category: cM0 AJCC 8 Stage Grouping: IIIA ECOG Performance Status: 1 Intent of Therapy: Curative Intent, Discussed with Patient

## 2019-12-31 NOTE — Progress Notes (Signed)
Tumor Board Documentation  Elizabeth [name] was presented by Dr Mike Gip at our Tumor Board on 12/31/2019, which included representatives from medical oncology, radiation oncology, pathology, radiology, surgical, surgical oncology, navigation, internal medicine, palliative care, research, genetics.  Elizabeth [name] currently presents as a current patient, for new positive pathology, for [place] with history of the following treatments: surgical intervention(s), active survellience.  Additionally, we reviewed previous medical and familial history, history of present illness, and recent lab results along with all available histopathologic and imaging studies. The tumor board considered available treatment options and made the following recommendations: Additional screening, Concurrent chemo-radiation therapy, Immunotherapy    The following procedures/referrals were also placed: No orders of the defined types were placed in this encounter.   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  Tumor board is a meeting of clinicians from various specialty areas who evaluate and discuss patients for whom a multidisciplinary approach is being considered. Final determinations in the plan of care are those of the provider(s). The responsibility for follow up of recommendations given during tumor board is that of the provider.   Today's extended care, comprehensive team conference, Elizabeth [name] was not present for the discussion and was not examined.   Multidisciplinary Tumor Board is a multidisciplinary case peer review process.  Decisions discussed in the Multidisciplinary Tumor Board reflect the opinions of the specialists present at the conference without having examined the patient.  Ultimately, treatment and diagnostic decisions  rest with the primary provider(s) and the patient.

## 2020-01-01 ENCOUNTER — Inpatient Hospital Stay: Payer: Federal, State, Local not specified - PPO | Admitting: Hematology and Oncology

## 2020-01-04 ENCOUNTER — Other Ambulatory Visit: Payer: Self-pay

## 2020-01-04 ENCOUNTER — Ambulatory Visit (HOSPITAL_COMMUNITY)
Admission: RE | Admit: 2020-01-04 | Discharge: 2020-01-04 | Disposition: A | Discharge status: Home / Self Care | Payer: Federal, State, Local not specified - PPO | Source: Ambulatory Visit | Attending: Internal Medicine | Admitting: Internal Medicine

## 2020-01-04 ENCOUNTER — Encounter: Payer: Self-pay | Admitting: *Deleted

## 2020-01-04 ENCOUNTER — Telehealth: Payer: Self-pay | Admitting: Internal Medicine

## 2020-01-04 DIAGNOSIS — C3490 Malignant neoplasm of unspecified part of unspecified bronchus or lung: Secondary | ICD-10-CM

## 2020-01-04 MED ORDER — GADOBUTROL 1 MMOL/ML IV SOLN
8.0000 mL | Freq: Once | INTRAVENOUS | Status: AC | PRN
  Administered 2020-01-04: 8 mL via INTRAVENOUS

## 2020-01-04 NOTE — Progress Notes (Signed)
Oncology Nurse Navigator Documentation  Oncology Nurse Navigator Flowsheets 01/04/2020  Abnormal Finding Date -  Confirmed Diagnosis Date -  Diagnosis Status -  Planned Course of Treatment -  Phase of Treatment -  Navigator Follow Up Date: -  Navigator Follow Up Reason: -  Navigation Complete Date: -  Navigator Location [facility]  Referral Date to RadOnc/MedOnc -  Navigator Encounter Type Other/I followed up on Ms. Elizabeth [name]'s schedule and notice she has not been scheduled with rad onc. I reached out to them to see if they needed anything from [place] to get patient scheduled.   Telephone -  [facility] Date -  Multidisiplinary Clinic Type -  Patient Visit Type -  Treatment Phase Pre-Tx/Tx Discussion  Barriers/Navigation Needs Coordination of Care  Education -  Interventions Coordination of Care  Acuity Level 2-Minimal Needs (1-2 Barriers Identified)  Coordination of Care Other  Education Method -  Time Spent with Patient 15

## 2020-01-04 NOTE — Telephone Encounter (Signed)
Scheduled per los. Called and left msg. Advised to get printout at appt this Friday

## 2020-01-06 ENCOUNTER — Other Ambulatory Visit: Payer: Self-pay

## 2020-01-06 ENCOUNTER — Ambulatory Visit
Admission: RE | Admit: 2020-01-06 | Discharge: 2020-01-06 | Disposition: A | Discharge status: Home / Self Care | Payer: Federal, State, Local not specified - PPO | Source: Ambulatory Visit | Attending: Radiation Oncology | Admitting: Radiation Oncology

## 2020-01-06 ENCOUNTER — Encounter: Payer: Self-pay | Admitting: Radiation Oncology

## 2020-01-06 VITALS — BP 139/74 | HR 79 | Temp 98.2°F | Resp 20 | Ht 62.0 in | Wt 176.8 lb

## 2020-01-06 DIAGNOSIS — Z801 Family history of malignant neoplasm of trachea, bronchus and lung: Secondary | ICD-10-CM | POA: Insufficient documentation

## 2020-01-06 DIAGNOSIS — M8580 Other specified disorders of bone density and structure, unspecified site: Secondary | ICD-10-CM | POA: Insufficient documentation

## 2020-01-06 DIAGNOSIS — F419 Anxiety disorder, unspecified: Secondary | ICD-10-CM | POA: Insufficient documentation

## 2020-01-06 DIAGNOSIS — C50911 Malignant neoplasm of unspecified site of right female breast: Secondary | ICD-10-CM | POA: Insufficient documentation

## 2020-01-06 DIAGNOSIS — Z803 Family history of malignant neoplasm of breast: Secondary | ICD-10-CM | POA: Diagnosis not present

## 2020-01-06 DIAGNOSIS — Z9221 Personal history of antineoplastic chemotherapy: Secondary | ICD-10-CM | POA: Diagnosis not present

## 2020-01-06 DIAGNOSIS — Z79899 Other long term (current) drug therapy: Secondary | ICD-10-CM | POA: Insufficient documentation

## 2020-01-06 DIAGNOSIS — Z923 Personal history of irradiation: Secondary | ICD-10-CM | POA: Diagnosis not present

## 2020-01-06 DIAGNOSIS — C778 Secondary and unspecified malignant neoplasm of lymph nodes of multiple regions: Secondary | ICD-10-CM | POA: Insufficient documentation

## 2020-01-06 DIAGNOSIS — C3431 Malignant neoplasm of lower lobe, right bronchus or lung: Secondary | ICD-10-CM | POA: Insufficient documentation

## 2020-01-06 DIAGNOSIS — Z87891 Personal history of nicotine dependence: Secondary | ICD-10-CM | POA: Diagnosis not present

## 2020-01-06 DIAGNOSIS — Z8042 Family history of malignant neoplasm of prostate: Secondary | ICD-10-CM | POA: Diagnosis not present

## 2020-01-06 DIAGNOSIS — C3491 Malignant neoplasm of unspecified part of right bronchus or lung: Secondary | ICD-10-CM

## 2020-01-06 DIAGNOSIS — K219 Gastro-esophageal reflux disease without esophagitis: Secondary | ICD-10-CM | POA: Insufficient documentation

## 2020-01-06 DIAGNOSIS — Z170 Estrogen receptor positive status [ER+]: Secondary | ICD-10-CM | POA: Insufficient documentation

## 2020-01-06 DIAGNOSIS — I6782 Cerebral ischemia: Secondary | ICD-10-CM | POA: Insufficient documentation

## 2020-01-06 NOTE — Patient Instructions (Signed)
Coronavirus (COVID-19) Are you at risk?  Are you at risk for the Coronavirus (COVID-19)?  To be considered HIGH RISK for Coronavirus (COVID-19), you have to meet the following criteria:  . Traveled to [place], [place], [place], [place] or [place]; or in the [place] to [place], [place], [place], or [place]; and have fever, cough, and shortness of breath within the last 2 weeks of travel OR . Been in close contact with a person diagnosed with COVID-19 within the last 2 weeks and have fever, cough, and shortness of breath . IF YOU DO NOT MEET THESE CRITERIA, YOU ARE CONSIDERED LOW RISK FOR COVID-19.  What to do if you are HIGH RISK for COVID-19?  . If you are having a medical emergency, call 911. . Seek medical care right away. Before you go to a doctor's office, urgent care or emergency department, call ahead and tell them about your recent travel, contact with someone diagnosed with COVID-19, and your symptoms. You should receive instructions from your physician's office regarding next steps of care.  . When you arrive at healthcare provider, tell the healthcare staff immediately you have returned from visiting [place], [place], [place], [place] or [place]; or traveled in the [place] to [place], [place], [place], or [place]; in the last two weeks or you have been in close contact with a person diagnosed with COVID-19 in the last 2 weeks.   . Tell the health care staff about your symptoms: fever, cough and shortness of breath. . After you have been seen by a medical provider, you will be either: o Tested for (COVID-19) and discharged home on quarantine except to seek medical care if symptoms worsen, and asked to  - Stay home and avoid contact with others until you get your results (4-5 days)  - Avoid travel on public transportation if possible (such as bus, train, or airplane) or o Sent to the Emergency Department by EMS for evaluation, COVID-19 testing, and possible  admission depending on your condition and test results.  What to do if you are LOW RISK for COVID-19?  Reduce your risk of any infection by using the same precautions used for avoiding the common cold or flu:  . Wash your hands often with soap and warm water for at least 20 seconds.  If soap and water are not readily available, use an alcohol-based hand sanitizer with at least 60% alcohol.  . If coughing or sneezing, cover your mouth and nose by coughing or sneezing into the elbow areas of your shirt or coat, into a tissue or into your sleeve (not your hands). . Avoid shaking hands with others and consider head nods or verbal greetings only. . Avoid touching your eyes, nose, or mouth with unwashed hands.  . Avoid close contact with people who are sick. . Avoid places or events with large numbers of people in one location, like concerts or sporting events. . Carefully consider travel plans you have or are making. . If you are planning any travel outside or inside the [place], visit the CDC's Travelers' Health webpage for the latest health notices. . If you have some symptoms but not all symptoms, continue to monitor at home and seek medical attention if your symptoms worsen. . If you are having a medical emergency, call 911.   ADDITIONAL HEALTHCARE OPTIONS FOR PATIENTS  [place] [facility] / e-Visit: [email or web address]         [facility]: [phone]  [place]   [facility]: [phone]                   [facility]: [phone]   

## 2020-01-06 NOTE — Progress Notes (Signed)
Radiation Oncology         [phone] ________________________________  Initial Outpatient Consultation  Name: Elizabeth [name] MRN: [id number]  Date: 01/06/2020  DOB: 12/20/1950  CC:Fisher, Donald E, MD  Mohamed, Mohamed, MD   REFERRING PHYSICIAN: Mohamed, Mohamed, MD  DIAGNOSIS: The encounter diagnosis was Adenocarcinoma of right lung, stage 3 (HCC).   Stage IIIa (T1b, N2, M0) non-small cell lung cancer, adenocarcinoma presented with right lower lobe lung nodule in addition to right hilar and mediastinal lymphadenopathy diagnosed in March of 2021  Stage IA (pT1c, pN0) Right Breast, Invasive Mammary Carcinoma with adjacent DCIS, ER+ / PR+ / Her2-, Grade 2 diagnosed in July of 2020  HISTORY OF PRESENT ILLNESS::Elizabeth [name] is a 68 y.o. female who is accompanied by no one due to COVID-19 restrictions. She had routine screening mammography on 04/13/2019 that suggested further evaluation for possible asymmetry and calcifications in the right breast. She underwent unilateral diagnostic mammography and right breast ultrasonography on 04/22/2019 that showed right superior breast asymmetry, for which stereotactic core needle biopsy was recommended.  Oncotype DX was obtained on the final surgical sample and the recurrence score of 10 predicts a risk of recurrence outside the breast over the next 9 years of 3%, if the patient's only systemic therapy is an antiestrogen for 5 years. It also predicts no benefit from chemotherapy.  Biopsy on 04/27/2019 showed grade 2 invasive mammary carcinoma with ductal and lobular features. Prognostic indicators significant for estrogen receptor, >90% positive and progesterone receptor, >90% positive, both with strong staining intensities. HER2 negative.  MRI of bilateral breasts on 05/04/2019 showed known right breast malignancy in the lateral midportion of the right breast measuring 1.1 cm. There was also some associated biopsy change.  Left breast was negative. There was no evidence for adenopathy.  The patient underwent a right breast lumpectomy with sentinel lymph node biopsy on 05/08/2019 performed by Dr. Pabon. Pathology from the procedure showed invasive mammary carcinoma with lobular features and adjacent ductal carcinoma in situ. One lymph node was also biopsied and was negative for malignancy.  The patient received breast radiation from 06/10/2019 - 07/29/2019 at the [facility] under the direction of Dr. Chrystal.  she began Tamoxifen on 08/04/2019.   Patient was noted to have elevated tumor markers after her breast cancer treatment which prompted a PET scan.  PET scan on 12/07/2019 showed hypermetabolic paratracheal, right hilar, and subcarinal adenopathy, suspicious for active malignancy. It also showed a 1.5 x 0.9 cm right lower lobe nodule with irregular margins observed. It was not definitive for malignancy and could be inflammatory, versus low-grade malignancy. The appearance would be atypical for metastatic breast cancer given the irregular margins, but could conceivably represent a low-grade primary lung adenocarcinoma. There was also some reticulonodular opacities in the right lower lobe likely from atypical infectious bronchiolitis. Finally, there was a band-like density with small nodular components medially at the left lung base, warranting surveillance, but was not hypermetabolic but below sensitive PET-CT size thresholds.  The patient underwent a video bronchoscopy with endobronchial ultrasound on 12/22/2019 performed by Dr. Byrum. Cytology report showed malignant cells consistent with carcinoma in three lymph nodes (4R, 7, and 10R). The findings were consistent with metastatic lung adenocarcinoma.  Patient wished to transfer her treatment to [place] [facility]  The patient was last seen by Dr. Mohamed on 12/31/2019, during which time they discussed completing the staging work-up by  ordering an MRI of the brain to rule out brain metastasis. They   also discussed treatment options, for which she was recommended a course of concurrent chemoradiation with weekly Carboplatin and Paclitaxel. After concurrent chemoradiation, the patient may benefit from consolidation treatment with immunotherapy with Imfinzi if she has no evidence of disease progression after the induction phase.  The patient was seen by Dr. Mike Gip, medical oncologist, on 01/01/2020. During that time, the patient was noted to be doing well and was asymptomatic.  MRI of brain on 01/04/2020 did not show any evidence of intracranial metastatic disease.  PREVIOUS RADIATION THERAPY: Yes; 06/10/2019 - 07/29/2019 to right breast. (Dr. Noreene Filbert)  PAST MEDICAL HISTORY:  Past Medical History:  Diagnosis Date  . Anginal pain ([place])   . Anxiety   . Cancer [facility])    breast cancer (radical lumpectomy and radiation  . Complication of anesthesia    one time woke when she had a tube down throat and had a panic attack  . Constipation due to opioid therapy   . Degenerative joint disease (DJD) of hip   . GERD (gastroesophageal reflux disease)   . Headache    migraines when she was working  . Osteopenia   . Pneumonia   . Restless leg syndrome    takes Mirapex and xanax  . Scoliosis   . SUI (stress urinary incontinence, female)   . Vaso-vagal reaction    after back surgery    PAST SURGICAL HISTORY: Past Surgical History:  Procedure Laterality Date  . APPENDECTOMY  1963  . BACK SURGERY    . BREAST BIOPSY Right 04/27/2019   Affirm Bx "X" clip-INVASIVE MAMMARY CARCINOMA,  . BREAST CYST ASPIRATION Right 1988  . BREAST EXCISIONAL BIOPSY Right 1990   benign x 3  . BREAST LUMPECTOMY Right 05/08/2019  . BREAST LUMPECTOMY WITH SENTINEL LYMPH NODE BIOPSY Right 05/08/2019   Procedure: RIGHT BREAST LUMPECTOMY WITH SENTINEL LYMPH NODE BX AND WIRE LOC., LATEX ALLERGY;  Surgeon: Jules Husbands, MD;  Location: [facility];   Service: General;  Laterality: Right;  . BREAST SURGERY    . CATARACT EXTRACTION W/PHACO Right 03/31/2018   Procedure: CATARACT EXTRACTION PHACO AND INTRAOCULAR LENS PLACEMENT ([place]) RIGHT;  Surgeon: Leandrew Koyanagi, MD;  Location: [place];  Service: Ophthalmology;  Laterality: Right;  Latex sensitivity  . CATARACT EXTRACTION W/PHACO Left 04/16/2018   Procedure: CATARACT EXTRACTION PHACO AND INTRAOCULAR LENS PLACEMENT ([place])  LEFT;  Surgeon: Leandrew Koyanagi, MD;  Location: [place];  Service: Ophthalmology;  Laterality: Left;  . EYE SURGERY     Lasik  . FINE NEEDLE ASPIRATION  12/22/2019   Procedure: FINE NEEDLE ASPIRATION (FNA) LINEAR;  Surgeon: Collene Gobble, MD;  Location: [place] [facility];  Service: Pulmonary;;  . FOOT SURGERY Bilateral   . HYSTEROTOMY    . LAMINECTOMY  2006  . ROTATOR CUFF REPAIR Bilateral   . TONSILLECTOMY     age 8  . TOTAL HIP ARTHROPLASTY Right 01/18/2015   Procedure: RIGHT TOTAL HIP ARTHROPLASTY ANTERIOR APPROACH;  Surgeon: Renette Butters, MD;  Location: [place];  Service: Orthopedics;  Laterality: Right;  . TUBAL LIGATION    . VIDEO BRONCHOSCOPY WITH ENDOBRONCHIAL ULTRASOUND N/A 12/22/2019   Procedure: VIDEO BRONCHOSCOPY WITH ENDOBRONCHIAL ULTRASOUND;  Surgeon: Collene Gobble, MD;  Location: [facility];  Service: Pulmonary;  Laterality: N/A;    FAMILY HISTORY:  Family History  Problem Relation Age of [place]set  . Alcohol abuse Mother   . Cancer Mother 53       lung  . Migraines Mother   .  Cancer Father 73       prostate  . Diabetes Father   . Alcohol abuse Brother   . Cancer Brother        skin  . Cancer Daughter        breast  . Breast cancer Daughter 41  . Bladder Cancer Neg Hx   . Kidney cancer Neg Hx     SOCIAL HISTORY:  Social History   Tobacco Use  . Smoking status: Former Smoker    Packs/day: 0.50    Years: 20.00    Pack years: 10.00    Types: Cigarettes    Quit date: 01/07/2012    Years since quitting: 8.0   . Smokeless tobacco: Never Used  Substance Use Topics  . Alcohol use: Yes    Alcohol/week: 5.0 standard drinks    Types: 5 Glasses of wine per week    Comment: red wine  . Drug use: No    ALLERGIES:  Allergies  Allergen Reactions  . Zostavax [Zoster Vaccine Live] Rash  . Naproxen Hives  . Penicillins Hives    Did it involve swelling of the face/tongue/throat, SOB, or low BP? No Did it involve sudden or severe rash/hives, skin peeling, or any reaction on the inside of your mouth or nose? Yes Did you need to seek medical attention at a hospital or doctor's office? Yes When did it last happen?14 or 16 If all above answers are "NO", may proceed with cephalosporin use.   . Zoloft [Sertraline Hcl] Hives  . Latex Itching and Rash    Condoms    MEDICATIONS:  Current Outpatient Medications  Medication Sig Dispense Refill  . ALPRAZolam (XANAX) 0.5 MG tablet Take 3 tablets (1.5 mg total) by mouth at bedtime.    . calcium-vitamin D (OSCAL WITH D) 500-200 MG-UNIT tablet Take 1 tablet by mouth daily.    . clindamycin (CLEOCIN) 150 MG capsule Take 450 mg by mouth See admin instructions. Take before dental procedures    . Multiple Vitamins-Minerals (MULTIVITAMIN WITH MINERALS) tablet Take 1 tablet by mouth 2 (two) times daily.     . omeprazole (PRILOSEC) 20 MG capsule Take 1 capsule (20 mg total) by mouth daily. 90 capsule 4  . pramipexole (MIRAPEX) 1 MG tablet TAKE 1 TABLET BY MOUTH AT BEDTIME 30 tablet 12  . tamoxifen (NOLVADEX) 20 MG tablet Take 1 tablet (20 mg total) by mouth daily. 90 tablet 3  . valACYclovir (VALTREX) 500 MG tablet Take 1 tablet (500 mg total) by mouth daily as needed (Cold sores).    . nitroGLYCERIN (NITROSTAT) 0.4 MG SL tablet Place 1 tablet (0.4 mg total) under the tongue every 5 (five) minutes as needed for chest pain. Go to ER if third tablet is necessary (Patient not taking: Reported on 12/31/2019) 30 tablet 5  . ondansetron (ZOFRAN) 4 MG tablet Take 1 tablet  (4 mg total) by mouth every 8 (eight) hours as needed for nausea or vomiting. (Patient not taking: Reported on 12/31/2019) 20 tablet 0   No current facility-administered medications for this encounter.    REVIEW OF SYSTEMS:  A 10+ POINT REVIEW OF SYSTEMS WAS OBTAINED including neurology, dermatology, psychiatry, cardiac, respiratory, lymph, extremities, GI, GU, musculoskeletal, constitutional, reproductive, HEENT.  She reports tolerating her breast radiation therapy well with minimal skin issues and fatigue.  She denies any appetite changes weight loss mild fatigue issues or swallowing difficulties   PHYSICAL EXAM:  height is 5' 2" (1.575 m) and weight is 176 lb 12.8   oz (80.2 kg). Her temperature is 98.2 F (36.8 C). Her blood pressure is 139/74 and her pulse is 79. Her respiration is 20 and oxygen saturation is 99%.   General: Alert and oriented, in no acute distress HEENT: Head is normocephalic. Extraocular movements are intact. Neck: Neck is supple, no palpable cervical or supraclavicular lymphadenopathy. Heart: Regular in rate and rhythm with no murmurs, rubs, or gallops. Chest: Clear to auscultation bilaterally, with no rhonchi, wheezes, or rales. Abdomen: Soft, nontender, nondistended, with no rigidity or guarding. Extremities: No cyanosis or edema. Lymphatics: see Neck Exam Skin: No concerning lesions. Musculoskeletal: symmetric strength and muscle tone throughout. Neurologic: Cranial nerves II through XII are grossly intact. No obvious focalities. Speech is fluent. Coordination is intact. Psychiatric: Judgment and insight are intact. Affect is appropriate. Left breast: No palpable mass, nipple discharge, or bleeding. Right breast: Mild hyperpigmentation changes noted in the breast area.  No palpable or visible signs of recurrence lumpectomy scar in the lateral aspect of the breast  ECOG = 1  0 - Asymptomatic (Fully active, able to carry on all predisease activities without  restriction)  1 - Symptomatic but completely ambulatory (Restricted in physically strenuous activity but ambulatory and able to carry out work of a light or sedentary nature. For example, light housework, office work)  2 - Symptomatic, <50% in bed during the day (Ambulatory and capable of all self care but unable to carry out any work activities. Up and about more than 50% of waking hours)  3 - Symptomatic, >50% in bed, but not bedbound (Capable of only limited self-care, confined to bed or chair 50% or more of waking hours)  4 - Bedbound (Completely disabled. Cannot carry on any self-care. Totally confined to bed or chair)  5 - Death   Oken MM, Creech RH, Tormey DC, et al. (1982). "Toxicity and response criteria of the Eastern Cooperative Oncology Group". Am. J. Clin. Oncol. 5 (6): 649-55  LABORATORY DATA:  Lab Results  Component Value Date   WBC 9.1 12/31/2019   HGB 12.6 12/31/2019   HCT 38.9 12/31/2019   MCV 94.2 12/31/2019   PLT 257 12/31/2019   NEUTROABS 6.5 12/31/2019   Lab Results  Component Value Date   NA 140 12/31/2019   K 4.4 12/31/2019   CL 104 12/31/2019   CO2 27 12/31/2019   GLUCOSE 99 12/31/2019   CREATININE 0.69 12/31/2019   CALCIUM 8.9 12/31/2019      RADIOGRAPHY: MR BRAIN W WO CONTRAST  Result Date: 01/04/2020 CLINICAL DATA:  Non-small cell lung cancer, staging EXAM: MRI HEAD WITHOUT AND WITH CONTRAST TECHNIQUE: Multiplanar, multiecho pulse sequences of the brain and surrounding structures were obtained without and with intravenous contrast. CONTRAST:  8mL GADAVIST GADOBUTROL 1 MMOL/ML IV SOLN COMPARISON:  None. FINDINGS: Brain: No acute infarction, hemorrhage, hydrocephalus, extra-axial collection or mass lesion. Few scattered foci of T2 hyperintensity are seen within the white matter of the cerebral hemispheres, nonspecific, most likely related to chronic small vessel ischemia. No focus of abnormal contrast enhancement identified. Vascular: Normal flow voids.  Skull and upper cervical spine: Degenerative changes are noted in the visualized upper cervical spine. Focus of low signal in the C3 vertebral body is unchanged from prior MRI in 2 no evidence of contrast enhancement. Marrow signal is otherwise maintained. Sinuses/Orbits: Bilateral lens surgery. The orbits are otherwise maintained. Paranasal sinuses are clear. Other: Partial empty sella. IMPRESSION: 1. No evidence of intracranial metastatic disease. 2. Mild chronic ischemic changes of the white matter.   Electronically Signed   By: Katyucia  De Macedo Rodrigues M.D.   On: 01/04/2020 08:21      IMPRESSION: Stage IIIa (T1b, N2, M0) non-small cell lung cancer, adenocarcinoma presented with right lower lobe lung nodule in addition to right hilar and mediastinal lymphadenopathy diagnosed in March of 2021   prior history of Stage IA (pT1c, pN0) Right Breast, Invasive Mammary Carcinoma with adjacent DCIS, ER+ / PR+ / Her2-, Grade 2 diagnosed in July of 2020  Patient would be a good candidate for definitive course of radiation therapy along with radiosensitizing chemotherapy for her new diagnosis of stage IIIa adenocarcinoma of the lung.  I discussed the general course of treatment side effects and toxicities of radiation therapy in the situation with the patient.  She appears to understand and wishes to proceed with planned course of treatment.  Treatment will be technically difficult to set up given her prior right breast radiation therapy and I am requesting intensity modulated radiation therapy to limit potential overlap with her previous radiation therapy to the chest.  PLAN: She wiill return tomorrow for CT simulation at 9 AM with treatments to begin next week concomitant with her first cycle of radiosensitizing chemotherapy.  Anticipate 6 weeks of radiation therapy directed at the primary lung lesion in the right lower lung field as well as her mediastinal and right hilar adenopathy.     ------------------------------------------------  James D. Kinard, PhD, MD  This document serves as a record of services personally performed by James Kinard, MD. It was created on his behalf by Maleeha Khan, a trained medical scribe. The creation of this record is based on the scribe's personal observations and the provider's statements to them. This document has been checked and approved by the attending provider.  

## 2020-01-06 NOTE — Progress Notes (Signed)
Thoracic Location of Tumor / Histology: The patient is recently diagnosed with stage IIIa (T1b, N2, M0) non-small cell lung cancer, adenocarcinoma presented with right lower lobe lung nodule in addition to right hilar and mediastinal lymphadenopathy diagnosed in March 2021.  Patient presented with symptoms of: The patient was followed by medical oncology at [facility] and she was noted to have persistently elevated CA 27.29 tumor marker.  She had a PET scan on 12/07/2019 and it showed a 1.5 x 0.9 cm right lower lobe nodule with irregular margins with maximum SUV of 2.3 that is not definitive for malignancy and could be inflammatory versus low-grade malignancy.  She had hypermetabolic paratracheal, right hilar and subcarinal adenopathy suspicious for active malignancy.  The patient was referred to Dr. Melvyn Novas and letter to Dr. Lamonte Sakai.  She underwent flexible video fiberoptic bronchoscopy with endobronchial ultrasound and biopsies under the care of Dr. Lamonte Sakai on 12/22/2019.  The final pathology ([id number]) from the 10R, 4R as well as 7 lymph nodes showed malignant cells consistent with metastatic lung adenocarcinoma. The malignant cells are positive for TTF-1 and Napsin-A. They are negative for GCDFP and GATA. The findings are consistent with metastatic lung adenocarcinoma.  For some reason the patient declined to continue her care at the [facility] and requested to transfer her care to [facility].  She was referred by Dr. Lamonte Sakai to me today for evaluation and recommendation regarding treatment of her recently diagnosed lung cancer.  Biopsies revealed: 12/22/19: FINAL MICROSCOPIC DIAGNOSIS:  A. LYMPH NODE,4R, FINE NEEDLE ASPIRATION:  - Malignant cells consistent with carcinoma.  - See comment.   B. LYMPH NODE, 7, FINE NEEDLE ASPIRATION:  - Malignant cells consistent with carcinoma.   C. LYMPH NODE,10R, FINE NEEDLE ASPIRATION:  - Malignant cells consistent with carcinoma.   COMMENT:    The malignant cells are positive for TTF-1 and Napsin-A. They are  negative for GCDFP and GATA. The findings are consistent with  metastatic lung adenocarcinoma.  Tobacco/Marijuana/Snuff/ETOH use:  Tobacco Use  . Smoking status: Former Smoker    Packs/day: 0.50    Years: 20.00    Pack years: 10.00    Types: Cigarettes    Quit date: 01/07/2012    Years since quitting: 7.9  . Smokeless tobacco: Never Used  Substance Use Topics  . Alcohol use: Yes    Alcohol/week: 5.0 standard drinks    Types: 5 Glasses of wine per week    Comment: red wine  . Drug use: No     Past/Anticipated interventions by cardiothoracic surgery, if any: None at this time.  Past/Anticipated interventions by medical oncology, if any: Per Dr. Julien Nordmann 12/31/19:I discussed with the patient her treatment options and recommended for her course of concurrent chemoradiation with weekly carboplatin for AUC of 2 and paclitaxel 45 NG/M2.  I discussed with the patient the adverse effect of this treatment including but not limited to alopecia, myelosuppression, nausea and vomiting, peripheral neuropathy, liver or renal dysfunction. After completion of the course of concurrent chemoradiation, the patient may benefit from consolidation treatment with immunotherapy with Imfinzi every 4 weeks if she has no evidence for disease progression after the induction phase. I will arrange for the patient to have a chemotherapy education class before the first dose of her treatment. I will call her pharmacy with prescription for Compazine 10 mg p.o. every 6 hours as needed for nausea. For the history of breast cancer, the patient will continue her current treatment with Femara.  The patient will  come back for follow-up visit with the first day of her treatment on 01/11/2020.   Signs/Symptoms Weight changes, if any:  Wt Readings from Last 3 Encounters:  01/06/20 176 lb 12.8 oz (80.2 kg)  12/31/19 178 lb 4.8 oz (80.9 kg)   12/22/19 172 lb (78 kg)      She denied having any recent chest pain, shortness of breath, cough or hemoptysis.  She denied having any nausea, vomiting or diarrhea.  She has no weight loss or night sweats.  She has no headache or visual changes.  Pain issues, if any: pt denies c/o pain.  SAFETY ISSUES: Prior radiation? YES Summary of Treatment: Course: C1_Breast Treatment Site: Breast_Rt Ref. ID: Breast_Rt Energy: 10X Dose/Fx (Gy): 1.8 #Fx: 28 / 28 Dose Correction (Gy): 0 Total Dose (Gy): 50.4 Start Date: 06/10/2019 End Date: 07/20/2019 Elapsed Days: 40    Pacemaker/ICD? no   Possible current pregnancy?no  Is the patient on methotrexate? no  Current Complaints / other details:  Pt presents today for initial consult with Dr. Sondra Come for Radiation Oncology. Pt is unaccompanied.   BP 139/74 (BP Location: Left Arm, Patient Position: Sitting, Cuff Size: Normal)   Pulse 79   Temp 98.2 F (36.8 C)   Resp 20   Ht 5\' 2"  (1.575 m)   Wt 176 lb 12.8 oz (80.2 kg)   SpO2 99%   BMI 32.34 kg/m   Loma Sousa, RN BSN

## 2020-01-06 NOTE — Progress Notes (Signed)
Pharmacist Chemotherapy Monitoring - Follow Up Assessment    I verify that I have reviewed each item in the below checklist:  . Regimen for the patient is scheduled for the appropriate day and plan matches scheduled date. Marland Kitchen Appropriate non-routine labs are ordered dependent on drug ordered. . If applicable, additional medications reviewed and ordered per protocol based on lifetime cumulative doses and/or treatment regimen.   Plan for follow-up and/or issues identified: No . I-vent associated with next due treatment: No . MD and/or nursing notified: No  Elizabeth [name] K 01/06/2020 8:33 AM

## 2020-01-07 ENCOUNTER — Ambulatory Visit
Admission: RE | Admit: 2020-01-07 | Discharge: 2020-01-07 | Disposition: A | Discharge status: Home / Self Care | Payer: Federal, State, Local not specified - PPO | Source: Ambulatory Visit | Attending: Radiation Oncology | Admitting: Radiation Oncology

## 2020-01-07 ENCOUNTER — Other Ambulatory Visit: Payer: Self-pay

## 2020-01-07 DIAGNOSIS — C3491 Malignant neoplasm of unspecified part of right bronchus or lung: Secondary | ICD-10-CM | POA: Diagnosis not present

## 2020-01-08 ENCOUNTER — Encounter: Payer: Self-pay | Admitting: *Deleted

## 2020-01-08 ENCOUNTER — Inpatient Hospital Stay: Payer: Federal, State, Local not specified - PPO

## 2020-01-08 ENCOUNTER — Other Ambulatory Visit: Payer: Self-pay

## 2020-01-08 ENCOUNTER — Inpatient Hospital Stay: Payer: Federal, State, Local not specified - PPO | Attending: Internal Medicine

## 2020-01-08 DIAGNOSIS — Z79899 Other long term (current) drug therapy: Secondary | ICD-10-CM | POA: Diagnosis not present

## 2020-01-08 DIAGNOSIS — Z79811 Long term (current) use of aromatase inhibitors: Secondary | ICD-10-CM | POA: Insufficient documentation

## 2020-01-08 DIAGNOSIS — Z170 Estrogen receptor positive status [ER+]: Secondary | ICD-10-CM | POA: Insufficient documentation

## 2020-01-08 DIAGNOSIS — C3411 Malignant neoplasm of upper lobe, right bronchus or lung: Secondary | ICD-10-CM | POA: Diagnosis not present

## 2020-01-08 DIAGNOSIS — R590 Localized enlarged lymph nodes: Secondary | ICD-10-CM | POA: Insufficient documentation

## 2020-01-08 DIAGNOSIS — C3491 Malignant neoplasm of unspecified part of right bronchus or lung: Secondary | ICD-10-CM

## 2020-01-08 DIAGNOSIS — C50411 Malignant neoplasm of upper-outer quadrant of right female breast: Secondary | ICD-10-CM | POA: Insufficient documentation

## 2020-01-08 DIAGNOSIS — Z5111 Encounter for antineoplastic chemotherapy: Secondary | ICD-10-CM | POA: Insufficient documentation

## 2020-01-08 LAB — CBC WITH DIFFERENTIAL (CANCER CENTER ONLY)
Abs Immature Granulocytes: 0.03 10*3/uL (ref 0.00–0.07)
Basophils Absolute: 0 10*3/uL (ref 0.0–0.1)
Basophils Relative: 0 %
Eosinophils Absolute: 0.2 10*3/uL (ref 0.0–0.5)
Eosinophils Relative: 2 %
HCT: 39.7 % (ref 36.0–46.0)
Hemoglobin: 12.8 g/dL (ref 12.0–15.0)
Immature Granulocytes: 0 %
Lymphocytes Relative: 17 %
Lymphs Abs: 1.5 10*3/uL (ref 0.7–4.0)
MCH: 30.4 pg (ref 26.0–34.0)
MCHC: 32.2 g/dL (ref 30.0–36.0)
MCV: 94.3 fL (ref 80.0–100.0)
Monocytes Absolute: 0.6 10*3/uL (ref 0.1–1.0)
Monocytes Relative: 6 %
Neutro Abs: 6.4 10*3/uL (ref 1.7–7.7)
Neutrophils Relative %: 75 %
Platelet Count: 229 10*3/uL (ref 150–400)
RBC: 4.21 MIL/uL (ref 3.87–5.11)
RDW: 12.8 % (ref 11.5–15.5)
WBC Count: 8.7 10*3/uL (ref 4.0–10.5)
nRBC: 0 % (ref 0.0–0.2)

## 2020-01-08 LAB — CMP (CANCER CENTER ONLY)
ALT: 30 U/L (ref 0–44)
AST: 22 U/L (ref 15–41)
Albumin: 3.8 g/dL (ref 3.5–5.0)
Alkaline Phosphatase: 61 U/L (ref 38–126)
Anion gap: 11 (ref 5–15)
BUN: 15 mg/dL (ref 8–23)
CO2: 24 mmol/L (ref 22–32)
Calcium: 9.1 mg/dL (ref 8.9–10.3)
Chloride: 106 mmol/L (ref 98–111)
Creatinine: 0.69 mg/dL (ref 0.44–1.00)
GFR, Est AFR Am: >60 mL/min (ref >60)
GFR, Estimated: >60 mL/min (ref >60)
Glucose, Bld: 100 mg/dL — ABNORMAL HIGH (ref 70–99)
Potassium: 4.1 mmol/L (ref 3.5–5.1)
Sodium: 141 mmol/L (ref 135–145)
Total Bilirubin: 0.4 mg/dL (ref 0.3–1.2)
Total Protein: 6.8 g/dL (ref 6.5–8.1)

## 2020-01-11 ENCOUNTER — Telehealth: Payer: Self-pay | Admitting: Medical Oncology

## 2020-01-11 ENCOUNTER — Other Ambulatory Visit: Payer: Self-pay | Admitting: Medical Oncology

## 2020-01-11 DIAGNOSIS — C3490 Malignant neoplasm of unspecified part of unspecified bronchus or lung: Secondary | ICD-10-CM

## 2020-01-11 MED ORDER — PROCHLORPERAZINE MALEATE 10 MG PO TABS: 10.0000 mg | ORAL_TABLET | Freq: Four times a day (QID) | ORAL | 0 refills | Status: DC | PRN

## 2020-01-11 NOTE — Telephone Encounter (Signed)
Compazine sent to preferred pharmacy -pt notified.

## 2020-01-12 ENCOUNTER — Inpatient Hospital Stay: Payer: Federal, State, Local not specified - PPO

## 2020-01-12 ENCOUNTER — Inpatient Hospital Stay: Payer: Federal, State, Local not specified - PPO | Admitting: Internal Medicine

## 2020-01-12 ENCOUNTER — Other Ambulatory Visit: Payer: Self-pay

## 2020-01-12 ENCOUNTER — Encounter: Payer: Self-pay | Admitting: Internal Medicine

## 2020-01-12 VITALS — BP 115/61 | HR 82 | Temp 98.7°F | Resp 17 | Ht 62.0 in | Wt 176.8 lb

## 2020-01-12 VITALS — BP 121/68 | HR 66 | Resp 16

## 2020-01-12 DIAGNOSIS — Z7189 Other specified counseling: Secondary | ICD-10-CM

## 2020-01-12 DIAGNOSIS — C3411 Malignant neoplasm of upper lobe, right bronchus or lung: Secondary | ICD-10-CM | POA: Diagnosis not present

## 2020-01-12 DIAGNOSIS — C3491 Malignant neoplasm of unspecified part of right bronchus or lung: Secondary | ICD-10-CM

## 2020-01-12 DIAGNOSIS — Z5111 Encounter for antineoplastic chemotherapy: Secondary | ICD-10-CM | POA: Diagnosis not present

## 2020-01-12 MED ORDER — SODIUM CHLORIDE 0.9 % IV SOLN
45.0000 mg/m2 | Freq: Once | INTRAVENOUS | Status: AC
  Administered 2020-01-12: 11:00:00 84 mg via INTRAVENOUS
  Filled 2020-01-12: qty 14

## 2020-01-12 MED ORDER — PALONOSETRON HCL INJECTION 0.25 MG/5ML
0.2500 mg | Freq: Once | INTRAVENOUS | Status: AC
  Administered 2020-01-12: 0.25 mg via INTRAVENOUS

## 2020-01-12 MED ORDER — DIPHENHYDRAMINE HCL 50 MG/ML IJ SOLN
INTRAMUSCULAR | Status: AC
  Filled 2020-01-12: qty 1

## 2020-01-12 MED ORDER — FAMOTIDINE IN NACL 20-0.9 MG/50ML-% IV SOLN
20.0000 mg | Freq: Once | INTRAVENOUS | Status: AC
  Administered 2020-01-12: 20 mg via INTRAVENOUS

## 2020-01-12 MED ORDER — SODIUM CHLORIDE 0.9 % IV SOLN
189.4000 mg | Freq: Once | INTRAVENOUS | Status: AC
  Administered 2020-01-12: 12:00:00 190 mg via INTRAVENOUS
  Filled 2020-01-12: qty 19

## 2020-01-12 MED ORDER — SODIUM CHLORIDE 0.9 % IV SOLN
20.0000 mg | Freq: Once | INTRAVENOUS | Status: AC
  Administered 2020-01-12: 10:00:00 20 mg via INTRAVENOUS
  Filled 2020-01-12: qty 20

## 2020-01-12 MED ORDER — FAMOTIDINE IN NACL 20-0.9 MG/50ML-% IV SOLN
INTRAVENOUS | Status: AC
  Filled 2020-01-12: qty 50

## 2020-01-12 MED ORDER — SODIUM CHLORIDE 0.9 % IV SOLN
Freq: Once | INTRAVENOUS | Status: AC
  Filled 2020-01-12: qty 250

## 2020-01-12 MED ORDER — PALONOSETRON HCL INJECTION 0.25 MG/5ML
INTRAVENOUS | Status: AC
  Filled 2020-01-12: qty 5

## 2020-01-12 MED ORDER — DIPHENHYDRAMINE HCL 50 MG/ML IJ SOLN
50.0000 mg | Freq: Once | INTRAMUSCULAR | Status: AC
  Administered 2020-01-12: 50 mg via INTRAVENOUS

## 2020-01-12 NOTE — Patient Instructions (Signed)
[place] Discharge Instructions for Patients Receiving Chemotherapy  Today you received the following chemotherapy agents Taxol, Carboplatin.  To help prevent nausea and vomiting after your treatment, we encourage you to take your nausea medication    If you develop nausea and vomiting that is not controlled by your nausea medication, call the clinic.   BELOW ARE SYMPTOMS THAT SHOULD BE REPORTED IMMEDIATELY:  *FEVER GREATER THAN 100.5 F  *CHILLS WITH OR WITHOUT FEVER  NAUSEA AND VOMITING THAT IS NOT CONTROLLED WITH YOUR NAUSEA MEDICATION  *UNUSUAL SHORTNESS OF BREATH  *UNUSUAL BRUISING OR BLEEDING  TENDERNESS IN MOUTH AND THROAT WITH OR WITHOUT PRESENCE OF ULCERS  *URINARY PROBLEMS  *BOWEL PROBLEMS  UNUSUAL RASH Items with * indicate a potential emergency and should be followed up as soon as possible.  Feel free to call the clinic should you have any questions or concerns. The clinic phone number is [phone].  Please show the [facility] at check-in to the Emergency Department and triage nurse.  Paclitaxel injection What is this medicine? PACLITAXEL (PAK li TAX el) is a chemotherapy drug. It targets fast dividing cells, like cancer cells, and causes these cells to die. This medicine is used to treat ovarian cancer, breast cancer, lung cancer, Kaposi's sarcoma, and other cancers. This medicine may be used for other purposes; ask your health care provider or pharmacist if you have questions. COMMON BRAND NAME(S): Onxol, Taxol What should I tell my health care provider before I take this medicine? They need to know if you have any of these conditions:  history of irregular heartbeat  liver disease  low blood counts, like low white cell, platelet, or red cell counts  lung or breathing disease, like asthma  tingling of the fingers or toes, or other nerve disorder  an unusual or allergic reaction to paclitaxel, alcohol, polyoxyethylated castor  oil, other chemotherapy, other medicines, foods, dyes, or preservatives  pregnant or trying to get pregnant  breast-feeding How should I use this medicine? This drug is given as an infusion into a vein. It is administered in a hospital or clinic by a specially trained health care professional. Talk to your pediatrician regarding the use of this medicine in children. Special care may be needed. Overdosage: If you think you have taken too much of this medicine contact a poison control center or emergency room at once. NOTE: This medicine is only for you. Do not share this medicine with others. What if I miss a dose? It is important not to miss your dose. Call your doctor or health care professional if you are unable to keep an appointment. What may interact with this medicine? Do not take this medicine with any of the following medications:  disulfiram  metronidazole This medicine may also interact with the following medications:  antiviral medicines for hepatitis, HIV or AIDS  certain antibiotics like erythromycin and clarithromycin  certain medicines for fungal infections like ketoconazole and itraconazole  certain medicines for seizures like carbamazepine, phenobarbital, phenytoin  gemfibrozil  nefazodone  rifampin  St. John's wort This list may not describe all possible interactions. Give your health care provider a list of all the medicines, herbs, non-prescription drugs, or dietary supplements you use. Also tell them if you smoke, drink alcohol, or use illegal drugs. Some items may interact with your medicine. What should I watch for while using this medicine? Your condition will be monitored carefully while you are receiving this medicine. You will need important blood work  done while you are taking this medicine. This medicine can cause serious allergic reactions. To reduce your risk you will need to take other medicine(s) before treatment with this medicine. If you  experience allergic reactions like skin rash, itching or hives, swelling of the face, lips, or tongue, tell your doctor or health care professional right away. In some cases, you may be given additional medicines to help with side effects. Follow all directions for their use. This drug may make you feel generally unwell. This is not uncommon, as chemotherapy can affect healthy cells as well as cancer cells. Report any side effects. Continue your course of treatment even though you feel ill unless your doctor tells you to stop. Call your doctor or health care professional for advice if you get a fever, chills or sore throat, or other symptoms of a cold or flu. Do not treat yourself. This drug decreases your body's ability to fight infections. Try to avoid being around people who are sick. This medicine may increase your risk to bruise or bleed. Call your doctor or health care professional if you notice any unusual bleeding. Be careful brushing and flossing your teeth or using a toothpick because you may get an infection or bleed more easily. If you have any dental work done, tell your dentist you are receiving this medicine. Avoid taking products that contain aspirin, acetaminophen, ibuprofen, naproxen, or ketoprofen unless instructed by your doctor. These medicines may hide a fever. Do not become pregnant while taking this medicine. Women should inform their doctor if they wish to become pregnant or think they might be pregnant. There is a potential for serious side effects to an unborn child. Talk to your health care professional or pharmacist for more information. Do not breast-feed an infant while taking this medicine. Men are advised not to father a child while receiving this medicine. This product may contain alcohol. Ask your pharmacist or healthcare provider if this medicine contains alcohol. Be sure to tell all healthcare providers you are taking this medicine. Certain medicines, like metronidazole  and disulfiram, can cause an unpleasant reaction when taken with alcohol. The reaction includes flushing, headache, nausea, vomiting, sweating, and increased thirst. The reaction can last from 30 minutes to several hours. What side effects may I notice from receiving this medicine? Side effects that you should report to your doctor or health care professional as soon as possible:  allergic reactions like skin rash, itching or hives, swelling of the face, lips, or tongue  breathing problems  changes in vision  fast, irregular heartbeat  high or low blood pressure  mouth sores  pain, tingling, numbness in the hands or feet  signs of decreased platelets or bleeding - bruising, pinpoint red spots on the skin, black, tarry stools, blood in the urine  signs of decreased red blood cells - unusually weak or tired, feeling faint or lightheaded, falls  signs of infection - fever or chills, cough, sore throat, pain or difficulty passing urine  signs and symptoms of liver injury like dark yellow or brown urine; general ill feeling or flu-like symptoms; light-colored stools; loss of appetite; nausea; right upper belly pain; unusually weak or tired; yellowing of the eyes or skin  swelling of the ankles, feet, hands  unusually slow heartbeat Side effects that usually do not require medical attention (report to your doctor or health care professional if they continue or are bothersome):  diarrhea  hair loss  loss of appetite  muscle or joint pain  nausea, vomiting  pain, redness, or irritation at site where injected  tiredness This list may not describe all possible side effects. Call your doctor for medical advice about side effects. You may report side effects to FDA at 1-800-FDA-1088. Where should I keep my medicine? This drug is given in a hospital or clinic and will not be stored at home. NOTE: This sheet is a summary. It may not cover all possible information. If you have  questions about this medicine, talk to your doctor, pharmacist, or health care provider.  2020 Elsevier/Gold Standard (2017-05-28 13:14:55) Carboplatin injection What is this medicine? CARBOPLATIN (KAR boe pla tin) is a chemotherapy drug. It targets fast dividing cells, like cancer cells, and causes these cells to die. This medicine is used to treat ovarian cancer and many other cancers. This medicine may be used for other purposes; ask your health care provider or pharmacist if you have questions. COMMON BRAND NAME(S): Paraplatin What should I tell my health care provider before I take this medicine? They need to know if you have any of these conditions:  blood disorders  hearing problems  kidney disease  recent or ongoing radiation therapy  an unusual or allergic reaction to carboplatin, cisplatin, other chemotherapy, other medicines, foods, dyes, or preservatives  pregnant or trying to get pregnant  breast-feeding How should I use this medicine? This drug is usually given as an infusion into a vein. It is administered in a hospital or clinic by a specially trained health care professional. Talk to your pediatrician regarding the use of this medicine in children. Special care may be needed. Overdosage: If you think you have taken too much of this medicine contact a poison control center or emergency room at once. NOTE: This medicine is only for you. Do not share this medicine with others. What if I miss a dose? It is important not to miss a dose. Call your doctor or health care professional if you are unable to keep an appointment. What may interact with this medicine?  medicines for seizures  medicines to increase blood counts like filgrastim, pegfilgrastim, sargramostim  some antibiotics like amikacin, gentamicin, neomycin, streptomycin, tobramycin  vaccines Talk to your doctor or health care professional before taking any of these  medicines:  acetaminophen  aspirin  ibuprofen  ketoprofen  naproxen This list may not describe all possible interactions. Give your health care provider a list of all the medicines, herbs, non-prescription drugs, or dietary supplements you use. Also tell them if you smoke, drink alcohol, or use illegal drugs. Some items may interact with your medicine. What should I watch for while using this medicine? Your condition will be monitored carefully while you are receiving this medicine. You will need important blood work done while you are taking this medicine. This drug may make you feel generally unwell. This is not uncommon, as chemotherapy can affect healthy cells as well as cancer cells. Report any side effects. Continue your course of treatment even though you feel ill unless your doctor tells you to stop. In some cases, you may be given additional medicines to help with side effects. Follow all directions for their use. Call your doctor or health care professional for advice if you get a fever, chills or sore throat, or other symptoms of a cold or flu. Do not treat yourself. This drug decreases your body's ability to fight infections. Try to avoid being around people who are sick. This medicine may increase your risk to bruise or bleed. Call  your doctor or health care professional if you notice any unusual bleeding. Be careful brushing and flossing your teeth or using a toothpick because you may get an infection or bleed more easily. If you have any dental work done, tell your dentist you are receiving this medicine. Avoid taking products that contain aspirin, acetaminophen, ibuprofen, naproxen, or ketoprofen unless instructed by your doctor. These medicines may hide a fever. Do not become pregnant while taking this medicine. Women should inform their doctor if they wish to become pregnant or think they might be pregnant. There is a potential for serious side effects to an unborn child. Talk  to your health care professional or pharmacist for more information. Do not breast-feed an infant while taking this medicine. What side effects may I notice from receiving this medicine? Side effects that you should report to your doctor or health care professional as soon as possible:  allergic reactions like skin rash, itching or hives, swelling of the face, lips, or tongue  signs of infection - fever or chills, cough, sore throat, pain or difficulty passing urine  signs of decreased platelets or bleeding - bruising, pinpoint red spots on the skin, black, tarry stools, nosebleeds  signs of decreased red blood cells - unusually weak or tired, fainting spells, lightheadedness  breathing problems  changes in hearing  changes in vision  chest pain  high blood pressure  low blood counts - This drug may decrease the number of white blood cells, red blood cells and platelets. You may be at increased risk for infections and bleeding.  nausea and vomiting  pain, swelling, redness or irritation at the injection site  pain, tingling, numbness in the hands or feet  problems with balance, talking, walking  trouble passing urine or change in the amount of urine Side effects that usually do not require medical attention (report to your doctor or health care professional if they continue or are bothersome):  hair loss  loss of appetite  metallic taste in the mouth or changes in taste This list may not describe all possible side effects. Call your doctor for medical advice about side effects. You may report side effects to FDA at 1-800-FDA-1088. Where should I keep my medicine? This drug is given in a hospital or clinic and will not be stored at home. NOTE: This sheet is a summary. It may not cover all possible information. If you have questions about this medicine, talk to your doctor, pharmacist, or health care provider.  2020 Elsevier/Gold Standard (2007-12-30 14:38:05)

## 2020-01-12 NOTE — Progress Notes (Signed)
[facility] [phone]   [phone]  OFFICE PROGRESS NOTE  Birdie Sons, MD [address] [place] [address]  DIAGNOSIS:  1) Stage IIIa (T1b, N2, M0) non-small cell lung cancer, adenocarcinoma presented with right lower lobe lung nodule in addition to right hilar and mediastinal lymphadenopathy diagnosed in March 2021. 2) history of a stage I (T1c, N0, M0 right breast adenocarcinoma with positive ER/PR status post right lumpectomy followed by adjuvant radiotherapy and currently on treatment with hormonal therapy with Femara.  PRIOR THERAPY: None.  CURRENT THERAPY: Concurrent chemoradiation with weekly carboplatin for AUC of 2 and paclitaxel 45 NG/M2.  Status post 1 cycle.  INTERVAL HISTORY: Elizabeth [name] 69 y.o. female returns to the clinic today for follow-up visit.  The patient is feeling fine today with no concerning complaints except for some tightness in her chest.  She denied having any current shortness of breath, cough or hemoptysis.  She denied having any fever or chills.  She has no nausea, vomiting, diarrhea or constipation.  She denied having any headache or visual changes.  She had MRI of the brain performed last week that showed no evidence of metastatic disease to the brain.  The patient is here today for evaluation before starting the first cycle of her concurrent chemoradiation.  MEDICAL HISTORY: Past Medical History:  Diagnosis Date  . Anginal pain ([place])   . Anxiety   . Cancer [facility])    breast cancer (radical lumpectomy and radiation  . Complication of anesthesia    one time woke when she had a tube down throat and had a panic attack  . Constipation due to opioid therapy   . Degenerative joint disease (DJD) of hip   . GERD (gastroesophageal reflux disease)   . Headache    migraines when she was working  . Osteopenia   . Pneumonia   . Restless leg syndrome    takes Mirapex and xanax  .  Scoliosis   . SUI (stress urinary incontinence, female)   . Vaso-vagal reaction    after back surgery    ALLERGIES:  is allergic to zostavax [zoster vaccine live]; naproxen; penicillins; zoloft [sertraline hcl]; and latex.  MEDICATIONS:  Current Outpatient Medications  Medication Sig Dispense Refill  . ALPRAZolam (XANAX) 0.5 MG tablet Take 3 tablets (1.5 mg total) by mouth at bedtime.    . calcium-vitamin D (OSCAL WITH D) 500-200 MG-UNIT tablet Take 1 tablet by mouth daily.    . clindamycin (CLEOCIN) 150 MG capsule Take 450 mg by mouth See admin instructions. Take before dental procedures    . Multiple Vitamins-Minerals (MULTIVITAMIN WITH MINERALS) tablet Take 1 tablet by mouth 2 (two) times daily.     . nitroGLYCERIN (NITROSTAT) 0.4 MG SL tablet Place 1 tablet (0.4 mg total) under the tongue every 5 (five) minutes as needed for chest pain. Go to ER if third tablet is necessary (Patient not taking: Reported on 12/31/2019) 30 tablet 5  . omeprazole (PRILOSEC) 20 MG capsule Take 1 capsule (20 mg total) by mouth daily. 90 capsule 4  . ondansetron (ZOFRAN) 4 MG tablet Take 1 tablet (4 mg total) by mouth every 8 (eight) hours as needed for nausea or vomiting. (Patient not taking: Reported on 12/31/2019) 20 tablet 0  . pramipexole (MIRAPEX) 1 MG tablet TAKE 1 TABLET BY MOUTH AT BEDTIME 30 tablet 12  . prochlorperazine (COMPAZINE) 10 MG tablet Take 1 tablet (10 mg total) by mouth every  6 (six) hours as needed for nausea or vomiting. 30 tablet 0  . tamoxifen (NOLVADEX) 20 MG tablet Take 1 tablet (20 mg total) by mouth daily. 90 tablet 3  . valACYclovir (VALTREX) 500 MG tablet Take 1 tablet (500 mg total) by mouth daily as needed (Cold sores).     No current facility-administered medications for this visit.    SURGICAL HISTORY:  Past Surgical History:  Procedure Laterality Date  . APPENDECTOMY  1963  . BACK SURGERY    . BREAST BIOPSY Right 04/27/2019   Affirm Bx "X" clip-INVASIVE MAMMARY  CARCINOMA,  . BREAST CYST ASPIRATION Right 1988  . BREAST EXCISIONAL BIOPSY Right 1990   benign x 3  . BREAST LUMPECTOMY Right 05/08/2019  . BREAST LUMPECTOMY WITH SENTINEL LYMPH NODE BIOPSY Right 05/08/2019   Procedure: RIGHT BREAST LUMPECTOMY WITH SENTINEL LYMPH NODE BX AND WIRE LOC., LATEX ALLERGY;  Surgeon: Jules Husbands, MD;  Location: [facility];  Service: General;  Laterality: Right;  . BREAST SURGERY    . CATARACT EXTRACTION W/PHACO Right 03/31/2018   Procedure: CATARACT EXTRACTION PHACO AND INTRAOCULAR LENS PLACEMENT ([place]) RIGHT;  Surgeon: Leandrew Koyanagi, MD;  Location: [facility];  Service: Ophthalmology;  Laterality: Right;  Latex sensitivity  . CATARACT EXTRACTION W/PHACO Left 04/16/2018   Procedure: CATARACT EXTRACTION PHACO AND INTRAOCULAR LENS PLACEMENT ([place])  LEFT;  Surgeon: Leandrew Koyanagi, MD;  Location: [facility];  Service: Ophthalmology;  Laterality: Left;  . EYE SURGERY     Lasik  . FINE NEEDLE ASPIRATION  12/22/2019   Procedure: FINE NEEDLE ASPIRATION (FNA) LINEAR;  Surgeon: Collene Gobble, MD;  Location: [facility];  Service: Pulmonary;;  . FOOT SURGERY Bilateral   . HYSTEROTOMY    . LAMINECTOMY  2006  . ROTATOR CUFF REPAIR Bilateral   . TONSILLECTOMY     age 70  . TOTAL HIP ARTHROPLASTY Right 01/18/2015   Procedure: RIGHT TOTAL HIP ARTHROPLASTY ANTERIOR APPROACH;  Surgeon: Renette Butters, MD;  Location: [facility];  Service: Orthopedics;  Laterality: Right;  . TUBAL LIGATION    . VIDEO BRONCHOSCOPY WITH ENDOBRONCHIAL ULTRASOUND N/A 12/22/2019   Procedure: VIDEO BRONCHOSCOPY WITH ENDOBRONCHIAL ULTRASOUND;  Surgeon: Collene Gobble, MD;  Location[facility];  Service: Pulmonary;  Laterality: N/A;    REVIEW OF SYSTEMS:  A comprehensive review of systems was negative except for: Respiratory: positive for pleurisy/chest pain   PHYSICAL EXAMINATION: General appearance: alert, cooperative and no distress Head: Normocephalic, without obvious  abnormality, atraumatic Neck: no adenopathy, no JVD, supple, symmetrical, trachea midline and thyroid not enlarged, symmetric, no tenderness/mass/nodules Lymph nodes: Cervical, supraclavicular, and axillary nodes normal. Resp: clear to auscultation bilaterally Back: symmetric, no curvature. ROM normal. No CVA tenderness. Cardio: regular rate and rhythm, S1, S2 normal, no murmur, click, rub or gallop GI: soft, non-tender; bowel sounds normal; no masses,  no organomegaly Extremities: extremities normal, atraumatic, no cyanosis or edema  ECOG PERFORMANCE STATUS: 1 - Symptomatic but completely ambulatory  Blood pressure 115/61, pulse 82, temperature 98.7 F (37.1 C), temperature source Temporal, resp. rate 17, height 5\' 2"  (1.575 m), weight 176 lb 12.8 oz (80.2 kg), SpO2 97 %.  LABORATORY DATA: Lab Results  Component Value Date   WBC 8.7 01/08/2020   HGB 12.8 01/08/2020   HCT 39.7 01/08/2020   MCV 94.3 01/08/2020   PLT 229 01/08/2020      Chemistry      Component Value Date/Time   NA 141 01/08/2020 1120   NA 140 02/25/2019  0955   K 4.1 01/08/2020 1120   CL 106 01/08/2020 1120   CO2 24 01/08/2020 1120   BUN 15 01/08/2020 1120   BUN 16 02/25/2019 0955   CREATININE 0.69 01/08/2020 1120   CREATININE 0.70 09/10/2017 0848   GLU 91 09/28/2014 0000      Component Value Date/Time   CALCIUM 9.1 01/08/2020 1120   ALKPHOS 61 01/08/2020 1120   AST 22 01/08/2020 1120   ALT 30 01/08/2020 1120   BILITOT 0.4 01/08/2020 1120       RADIOGRAPHIC STUDIES: MR BRAIN W WO CONTRAST  Result Date: 01/04/2020 CLINICAL DATA:  Non-small cell lung cancer, staging EXAM: MRI HEAD WITHOUT AND WITH CONTRAST TECHNIQUE: Multiplanar, multiecho pulse sequences of the brain and surrounding structures were obtained without and with intravenous contrast. CONTRAST:  65mL GADAVIST GADOBUTROL 1 MMOL/ML IV SOLN COMPARISON:  None. FINDINGS: Brain: No acute infarction, hemorrhage, hydrocephalus, extra-axial collection  or mass lesion. Few scattered foci of T2 hyperintensity are seen within the white matter of the cerebral hemispheres, nonspecific, most likely related to chronic small vessel ischemia. No focus of abnormal contrast enhancement identified. Vascular: Normal flow voids. Skull and upper cervical spine: Degenerative changes are noted in the visualized upper cervical spine. Focus of low signal in the C3 vertebral body is unchanged from prior MRI in 2 no evidence of contrast enhancement. Marrow signal is otherwise maintained. Sinuses/Orbits: Bilateral lens surgery. The orbits are otherwise maintained. Paranasal sinuses are clear. Other: Partial empty sella. IMPRESSION: 1. No evidence of intracranial metastatic disease. 2. Mild chronic ischemic changes of the white matter. Electronically Signed   By: Pedro Earls M.D.   On: 01/04/2020 08:21    ASSESSMENT AND PLAN: This is a very pleasant 69 years old white female with a stage IIIa non-small cell lung cancer, adenocarcinoma in March 2021 and presented with right lower lobe lung nodule in addition to right hilar and mediastinal lymphadenopathy. The patient also has a history of stage I right breast carcinoma and currently on treatment with Femara. She is currently undergoing a course of concurrent chemoradiation with weekly carboplatin and paclitaxel.  She is starting the first cycle of her treatment today. She had MRI of the brain last week that was unremarkable for any metastatic disease. I recommended for the patient to proceed with her treatment today as planned. I will see her back for follow-up visit in 2 weeks for evaluation before starting cycle #3. The patient was advised to call immediately if she has any concerning symptoms in the interval. The patient voices understanding of current disease status and treatment options and is in agreement with the current care plan.  All questions were answered. The patient knows to call the clinic  with any problems, questions or concerns. We can certainly see the patient much sooner if necessary.  Disclaimer: This note was dictated with voice recognition software. Similar sounding words can inadvertently be transcribed and may not be corrected upon review.

## 2020-01-12 NOTE — Progress Notes (Signed)
First time Taxol, [place]. Pt. Tolerated well without complaint. Questions answered. Nutrition given. VSS.

## 2020-01-13 ENCOUNTER — Ambulatory Visit: Payer: Federal, State, Local not specified - PPO

## 2020-01-13 ENCOUNTER — Ambulatory Visit (HOSPITAL_COMMUNITY): Payer: Federal, State, Local not specified - PPO

## 2020-01-13 DIAGNOSIS — C3491 Malignant neoplasm of unspecified part of right bronchus or lung: Secondary | ICD-10-CM | POA: Diagnosis not present

## 2020-01-14 ENCOUNTER — Other Ambulatory Visit: Payer: Self-pay

## 2020-01-14 ENCOUNTER — Ambulatory Visit
Admission: RE | Admit: 2020-01-14 | Discharge: 2020-01-14 | Disposition: A | Discharge status: Home / Self Care | Payer: Federal, State, Local not specified - PPO | Source: Ambulatory Visit | Attending: Radiation Oncology | Admitting: Radiation Oncology

## 2020-01-14 DIAGNOSIS — C3491 Malignant neoplasm of unspecified part of right bronchus or lung: Secondary | ICD-10-CM | POA: Diagnosis not present

## 2020-01-14 NOTE — Progress Notes (Signed)
  Radiation Oncology         [phone] ________________________________  Name: Elizabeth [name] MRN: [id number]  Date: 01/14/2020  DOB: 1951/05/11  Simulation Verification Note    ICD-10-CM   1. Adenocarcinoma of right lung, stage 3 (HCC)  C34.91     Status: outpatient  NARRATIVE: The patient was brought to the treatment unit and placed in the planned treatment position. The clinical setup was verified. Then port films were obtained and uploaded to the radiation oncology medical record software.  The treatment beams were carefully compared against the planned radiation fields. The position location and shape of the radiation fields was reviewed. They targeted volume of tissue appears to be appropriately covered by the radiation beams. Organs at risk appear to be excluded as planned.  Based on my personal review, I approved the simulation verification. The patient's treatment will proceed as planned.  -----------------------------------  Blair Promise, PhD, MD

## 2020-01-15 ENCOUNTER — Ambulatory Visit
Admission: RE | Admit: 2020-01-15 | Discharge: 2020-01-15 | Disposition: A | Discharge status: Home / Self Care | Payer: Federal, State, Local not specified - PPO | Source: Ambulatory Visit | Attending: Radiation Oncology | Admitting: Radiation Oncology

## 2020-01-15 ENCOUNTER — Other Ambulatory Visit: Payer: Self-pay

## 2020-01-15 DIAGNOSIS — C3491 Malignant neoplasm of unspecified part of right bronchus or lung: Secondary | ICD-10-CM | POA: Diagnosis not present

## 2020-01-18 ENCOUNTER — Ambulatory Visit
Admission: RE | Admit: 2020-01-18 | Discharge: 2020-01-18 | Disposition: A | Discharge status: Home / Self Care | Payer: Federal, State, Local not specified - PPO | Source: Ambulatory Visit | Attending: Radiation Oncology | Admitting: Radiation Oncology

## 2020-01-18 ENCOUNTER — Other Ambulatory Visit: Payer: Self-pay

## 2020-01-18 DIAGNOSIS — C3491 Malignant neoplasm of unspecified part of right bronchus or lung: Secondary | ICD-10-CM | POA: Diagnosis not present

## 2020-01-19 ENCOUNTER — Ambulatory Visit
Admission: RE | Admit: 2020-01-19 | Discharge: 2020-01-19 | Disposition: A | Discharge status: Home / Self Care | Payer: Federal, State, Local not specified - PPO | Source: Ambulatory Visit | Attending: Radiation Oncology | Admitting: Radiation Oncology

## 2020-01-19 ENCOUNTER — Other Ambulatory Visit: Payer: Self-pay

## 2020-01-19 ENCOUNTER — Encounter: Payer: Self-pay | Admitting: Internal Medicine

## 2020-01-19 ENCOUNTER — Other Ambulatory Visit: Payer: Self-pay | Admitting: Radiation Oncology

## 2020-01-19 ENCOUNTER — Inpatient Hospital Stay: Payer: Federal, State, Local not specified - PPO

## 2020-01-19 VITALS — BP 123/63 | HR 77 | Temp 98.9°F | Resp 18

## 2020-01-19 DIAGNOSIS — C3491 Malignant neoplasm of unspecified part of right bronchus or lung: Secondary | ICD-10-CM

## 2020-01-19 DIAGNOSIS — C3411 Malignant neoplasm of upper lobe, right bronchus or lung: Secondary | ICD-10-CM | POA: Diagnosis not present

## 2020-01-19 LAB — CBC WITH DIFFERENTIAL (CANCER CENTER ONLY)
Abs Immature Granulocytes: 0.02 10*3/uL (ref 0.00–0.07)
Basophils Absolute: 0 10*3/uL (ref 0.0–0.1)
Basophils Relative: 0 %
Eosinophils Absolute: 0.2 10*3/uL (ref 0.0–0.5)
Eosinophils Relative: 3 %
HCT: 38.6 % (ref 36.0–46.0)
Hemoglobin: 12.6 g/dL (ref 12.0–15.0)
Immature Granulocytes: 0 %
Lymphocytes Relative: 20 %
Lymphs Abs: 1.2 10*3/uL (ref 0.7–4.0)
MCH: 30.3 pg (ref 26.0–34.0)
MCHC: 32.6 g/dL (ref 30.0–36.0)
MCV: 92.8 fL (ref 80.0–100.0)
Monocytes Absolute: 0.4 10*3/uL (ref 0.1–1.0)
Monocytes Relative: 7 %
Neutro Abs: 3.9 10*3/uL (ref 1.7–7.7)
Neutrophils Relative %: 70 %
Platelet Count: 241 10*3/uL (ref 150–400)
RBC: 4.16 MIL/uL (ref 3.87–5.11)
RDW: 12.5 % (ref 11.5–15.5)
WBC Count: 5.7 10*3/uL (ref 4.0–10.5)
nRBC: 0 % (ref 0.0–0.2)

## 2020-01-19 LAB — CMP (CANCER CENTER ONLY)
ALT: 27 U/L (ref 0–44)
AST: 18 U/L (ref 15–41)
Albumin: 3.6 g/dL (ref 3.5–5.0)
Alkaline Phosphatase: 57 U/L (ref 38–126)
Anion gap: 9 (ref 5–15)
BUN: 9 mg/dL (ref 8–23)
CO2: 22 mmol/L (ref 22–32)
Calcium: 8.8 mg/dL — ABNORMAL LOW (ref 8.9–10.3)
Chloride: 108 mmol/L (ref 98–111)
Creatinine: 0.67 mg/dL (ref 0.44–1.00)
GFR, Est AFR Am: >60 mL/min (ref >60)
GFR, Estimated: >60 mL/min (ref >60)
Glucose, Bld: 100 mg/dL — ABNORMAL HIGH (ref 70–99)
Potassium: 4.7 mmol/L (ref 3.5–5.1)
Sodium: 139 mmol/L (ref 135–145)
Total Bilirubin: 0.3 mg/dL (ref 0.3–1.2)
Total Protein: 6.7 g/dL (ref 6.5–8.1)

## 2020-01-19 MED ORDER — FAMOTIDINE IN NACL 20-0.9 MG/50ML-% IV SOLN
20.0000 mg | Freq: Once | INTRAVENOUS | Status: AC
  Administered 2020-01-19: 20 mg via INTRAVENOUS

## 2020-01-19 MED ORDER — DIPHENHYDRAMINE HCL 50 MG/ML IJ SOLN
50.0000 mg | Freq: Once | INTRAMUSCULAR | Status: AC
  Administered 2020-01-19: 50 mg via INTRAVENOUS

## 2020-01-19 MED ORDER — SODIUM CHLORIDE 0.9 % IV SOLN
189.4000 mg | Freq: Once | INTRAVENOUS | Status: AC
  Administered 2020-01-19: 190 mg via INTRAVENOUS
  Filled 2020-01-19: qty 19

## 2020-01-19 MED ORDER — SUCRALFATE 1 G PO TABS: 1.0000 g | ORAL_TABLET | Freq: Three times a day (TID) | ORAL | 1 refills | Status: DC

## 2020-01-19 MED ORDER — DIPHENHYDRAMINE HCL 50 MG/ML IJ SOLN
INTRAMUSCULAR | Status: AC
  Filled 2020-01-19: qty 1

## 2020-01-19 MED ORDER — PALONOSETRON HCL INJECTION 0.25 MG/5ML
0.2500 mg | Freq: Once | INTRAVENOUS | Status: AC
  Administered 2020-01-19: 0.25 mg via INTRAVENOUS

## 2020-01-19 MED ORDER — SODIUM CHLORIDE 0.9 % IV SOLN
45.0000 mg/m2 | Freq: Once | INTRAVENOUS | Status: AC
  Administered 2020-01-19: 84 mg via INTRAVENOUS
  Filled 2020-01-19: qty 14

## 2020-01-19 MED ORDER — SODIUM CHLORIDE 0.9 % IV SOLN
20.0000 mg | Freq: Once | INTRAVENOUS | Status: AC
  Administered 2020-01-19: 20 mg via INTRAVENOUS
  Filled 2020-01-19: qty 20

## 2020-01-19 MED ORDER — PALONOSETRON HCL INJECTION 0.25 MG/5ML
INTRAVENOUS | Status: AC
  Filled 2020-01-19: qty 5

## 2020-01-19 MED ORDER — FAMOTIDINE IN NACL 20-0.9 MG/50ML-% IV SOLN
INTRAVENOUS | Status: AC
  Filled 2020-01-19: qty 50

## 2020-01-19 MED ORDER — SODIUM CHLORIDE 0.9 % IV SOLN
Freq: Once | INTRAVENOUS | Status: AC
  Filled 2020-01-19: qty 250

## 2020-01-19 NOTE — Patient Instructions (Signed)
[facility] Discharge Instructions for Patients Receiving Chemotherapy  Today you received the following chemotherapy agents Taxol; carboplatin  To help prevent nausea and vomiting after your treatment, we encourage you to take your nausea medication as directed   If you develop nausea and vomiting that is not controlled by your nausea medication, call the clinic.   BELOW ARE SYMPTOMS THAT SHOULD BE REPORTED IMMEDIATELY:  *FEVER GREATER THAN 100.5 F  *CHILLS WITH OR WITHOUT FEVER  NAUSEA AND VOMITING THAT IS NOT CONTROLLED WITH YOUR NAUSEA MEDICATION  *UNUSUAL SHORTNESS OF BREATH  *UNUSUAL BRUISING OR BLEEDING  TENDERNESS IN MOUTH AND THROAT WITH OR WITHOUT PRESENCE OF ULCERS  *URINARY PROBLEMS  *BOWEL PROBLEMS  UNUSUAL RASH Items with * indicate a potential emergency and should be followed up as soon as possible.  Feel free to call the clinic should you have any questions or concerns. The clinic phone number is [phone].  Please show the [place] at check-in to the Emergency Department and triage nurse.

## 2020-01-19 NOTE — Progress Notes (Signed)
Met with patient in treatment to introduce myself as Arboriculturist and to offer available resources.  Discussed one-time $1000 Radio broadcast assistant to assist with personal expenses while going through treatment.  Gave her my card if interested in applying and for any additional financial questions or concerns.

## 2020-01-20 ENCOUNTER — Encounter: Payer: Self-pay | Admitting: Internal Medicine

## 2020-01-20 ENCOUNTER — Ambulatory Visit
Admission: RE | Admit: 2020-01-20 | Discharge: 2020-01-20 | Disposition: A | Discharge status: Home / Self Care | Payer: Federal, State, Local not specified - PPO | Source: Ambulatory Visit | Attending: Radiation Oncology | Admitting: Radiation Oncology

## 2020-01-20 ENCOUNTER — Encounter (HOSPITAL_COMMUNITY): Payer: Self-pay | Admitting: Hematology and Oncology

## 2020-01-20 ENCOUNTER — Other Ambulatory Visit: Payer: Self-pay

## 2020-01-20 ENCOUNTER — Encounter: Payer: Self-pay | Admitting: *Deleted

## 2020-01-20 DIAGNOSIS — C3491 Malignant neoplasm of unspecified part of right bronchus or lung: Secondary | ICD-10-CM | POA: Diagnosis not present

## 2020-01-20 NOTE — Progress Notes (Signed)
Pharmacist Chemotherapy Monitoring - Follow Up Assessment    I verify that I have reviewed each item in the below checklist:  . Regimen for the patient is scheduled for the appropriate day and plan matches scheduled date. Marland Kitchen Appropriate non-routine labs are ordered dependent on drug ordered. . If applicable, additional medications reviewed and ordered per protocol based on lifetime cumulative doses and/or treatment regimen.   Plan for follow-up and/or issues identified: No . I-vent associated with next due treatment: No . MD and/or nursing notified: No  Acquanetta Belling 01/20/2020 11:18 AM

## 2020-01-21 ENCOUNTER — Other Ambulatory Visit: Payer: Self-pay

## 2020-01-21 ENCOUNTER — Ambulatory Visit
Admission: RE | Admit: 2020-01-21 | Discharge: 2020-01-21 | Disposition: A | Discharge status: Home / Self Care | Payer: Federal, State, Local not specified - PPO | Source: Ambulatory Visit | Attending: Radiation Oncology | Admitting: Radiation Oncology

## 2020-01-21 DIAGNOSIS — C3491 Malignant neoplasm of unspecified part of right bronchus or lung: Secondary | ICD-10-CM | POA: Diagnosis not present

## 2020-01-22 ENCOUNTER — Other Ambulatory Visit: Payer: Self-pay

## 2020-01-22 ENCOUNTER — Telehealth: Payer: Self-pay

## 2020-01-22 ENCOUNTER — Ambulatory Visit
Admission: RE | Admit: 2020-01-22 | Discharge: 2020-01-22 | Disposition: A | Discharge status: Home / Self Care | Payer: Federal, State, Local not specified - PPO | Source: Ambulatory Visit | Attending: Radiation Oncology | Admitting: Radiation Oncology

## 2020-01-22 DIAGNOSIS — C3491 Malignant neoplasm of unspecified part of right bronchus or lung: Secondary | ICD-10-CM | POA: Diagnosis not present

## 2020-01-22 NOTE — Telephone Encounter (Signed)
Replied to patient's mychart message.

## 2020-01-25 ENCOUNTER — Other Ambulatory Visit: Payer: Self-pay

## 2020-01-25 ENCOUNTER — Ambulatory Visit
Admission: RE | Admit: 2020-01-25 | Discharge: 2020-01-25 | Disposition: A | Discharge status: Home / Self Care | Payer: Federal, State, Local not specified - PPO | Source: Ambulatory Visit | Attending: Radiation Oncology | Admitting: Radiation Oncology

## 2020-01-25 DIAGNOSIS — C3491 Malignant neoplasm of unspecified part of right bronchus or lung: Secondary | ICD-10-CM | POA: Diagnosis not present

## 2020-01-25 NOTE — Telephone Encounter (Signed)
Elizabeth [name], Sorry I am just getting your message.  Please go to the emergency room if you continue to have pressure in your chest and jaw line.  I am  including your care team and added Dr. Arvilla Market nurse navigator.  Please let me know if I can be of any assistance.  Zooey Schreurs

## 2020-01-26 ENCOUNTER — Other Ambulatory Visit: Payer: Self-pay

## 2020-01-26 ENCOUNTER — Encounter: Payer: Self-pay | Admitting: Hematology and Oncology

## 2020-01-26 ENCOUNTER — Inpatient Hospital Stay: Payer: Federal, State, Local not specified - PPO

## 2020-01-26 ENCOUNTER — Encounter: Payer: Self-pay | Admitting: Internal Medicine

## 2020-01-26 ENCOUNTER — Ambulatory Visit
Admission: RE | Admit: 2020-01-26 | Discharge: 2020-01-26 | Disposition: A | Discharge status: Home / Self Care | Payer: Federal, State, Local not specified - PPO | Source: Ambulatory Visit | Attending: Radiation Oncology | Admitting: Radiation Oncology

## 2020-01-26 ENCOUNTER — Encounter: Payer: Self-pay | Admitting: *Deleted

## 2020-01-26 ENCOUNTER — Inpatient Hospital Stay: Payer: Federal, State, Local not specified - PPO | Admitting: Internal Medicine

## 2020-01-26 ENCOUNTER — Encounter (HOSPITAL_COMMUNITY): Payer: Self-pay | Admitting: Hematology and Oncology

## 2020-01-26 VITALS — BP 116/54 | HR 86 | Temp 98.9°F | Resp 18 | Ht 62.0 in | Wt 175.9 lb

## 2020-01-26 DIAGNOSIS — C3491 Malignant neoplasm of unspecified part of right bronchus or lung: Secondary | ICD-10-CM

## 2020-01-26 DIAGNOSIS — Z5111 Encounter for antineoplastic chemotherapy: Secondary | ICD-10-CM

## 2020-01-26 DIAGNOSIS — C3411 Malignant neoplasm of upper lobe, right bronchus or lung: Secondary | ICD-10-CM | POA: Diagnosis not present

## 2020-01-26 LAB — CBC WITH DIFFERENTIAL (CANCER CENTER ONLY)
Abs Immature Granulocytes: 0.03 10*3/uL (ref 0.00–0.07)
Basophils Absolute: 0 10*3/uL (ref 0.0–0.1)
Basophils Relative: 0 %
Eosinophils Absolute: 0.1 10*3/uL (ref 0.0–0.5)
Eosinophils Relative: 2 %
HCT: 36.5 % (ref 36.0–46.0)
Hemoglobin: 11.9 g/dL — ABNORMAL LOW (ref 12.0–15.0)
Immature Granulocytes: 1 %
Lymphocytes Relative: 15 %
Lymphs Abs: 0.6 10*3/uL — ABNORMAL LOW (ref 0.7–4.0)
MCH: 30.5 pg (ref 26.0–34.0)
MCHC: 32.6 g/dL (ref 30.0–36.0)
MCV: 93.6 fL (ref 80.0–100.0)
Monocytes Absolute: 0.3 10*3/uL (ref 0.1–1.0)
Monocytes Relative: 8 %
Neutro Abs: 2.8 10*3/uL (ref 1.7–7.7)
Neutrophils Relative %: 74 %
Platelet Count: 229 10*3/uL (ref 150–400)
RBC: 3.9 MIL/uL (ref 3.87–5.11)
RDW: 12.8 % (ref 11.5–15.5)
WBC Count: 3.8 10*3/uL — ABNORMAL LOW (ref 4.0–10.5)
nRBC: 0 % (ref 0.0–0.2)

## 2020-01-26 LAB — CMP (CANCER CENTER ONLY)
ALT: 25 U/L (ref 0–44)
AST: 19 U/L (ref 15–41)
Albumin: 3.5 g/dL (ref 3.5–5.0)
Alkaline Phosphatase: 56 U/L (ref 38–126)
Anion gap: 10 (ref 5–15)
BUN: 12 mg/dL (ref 8–23)
CO2: 22 mmol/L (ref 22–32)
Calcium: 8.9 mg/dL (ref 8.9–10.3)
Chloride: 107 mmol/L (ref 98–111)
Creatinine: 0.64 mg/dL (ref 0.44–1.00)
GFR, Est AFR Am: >60 mL/min (ref >60)
GFR, Estimated: >60 mL/min (ref >60)
Glucose, Bld: 95 mg/dL (ref 70–99)
Potassium: 4.3 mmol/L (ref 3.5–5.1)
Sodium: 139 mmol/L (ref 135–145)
Total Bilirubin: 0.3 mg/dL (ref 0.3–1.2)
Total Protein: 6.5 g/dL (ref 6.5–8.1)

## 2020-01-26 MED ORDER — PALONOSETRON HCL INJECTION 0.25 MG/5ML
0.2500 mg | Freq: Once | INTRAVENOUS | Status: AC
  Administered 2020-01-26: 0.25 mg via INTRAVENOUS

## 2020-01-26 MED ORDER — SODIUM CHLORIDE 0.9 % IV SOLN
45.0000 mg/m2 | Freq: Once | INTRAVENOUS | Status: AC
  Administered 2020-01-26: 15:00:00 84 mg via INTRAVENOUS
  Filled 2020-01-26: qty 14

## 2020-01-26 MED ORDER — SODIUM CHLORIDE 0.9 % IV SOLN
189.4000 mg | Freq: Once | INTRAVENOUS | Status: AC
  Administered 2020-01-26: 190 mg via INTRAVENOUS
  Filled 2020-01-26: qty 19

## 2020-01-26 MED ORDER — SODIUM CHLORIDE 0.9 % IV SOLN
20.0000 mg | Freq: Once | INTRAVENOUS | Status: AC
  Administered 2020-01-26: 14:00:00 20 mg via INTRAVENOUS
  Filled 2020-01-26: qty 20

## 2020-01-26 MED ORDER — PALONOSETRON HCL INJECTION 0.25 MG/5ML
INTRAVENOUS | Status: AC
  Filled 2020-01-26: qty 5

## 2020-01-26 MED ORDER — SODIUM CHLORIDE 0.9 % IV SOLN
Freq: Once | INTRAVENOUS | Status: AC
  Filled 2020-01-26: qty 250

## 2020-01-26 MED ORDER — FAMOTIDINE IN NACL 20-0.9 MG/50ML-% IV SOLN
20.0000 mg | Freq: Once | INTRAVENOUS | Status: AC
  Administered 2020-01-26: 20 mg via INTRAVENOUS

## 2020-01-26 MED ORDER — DIPHENHYDRAMINE HCL 50 MG/ML IJ SOLN
INTRAMUSCULAR | Status: AC
  Filled 2020-01-26: qty 1

## 2020-01-26 MED ORDER — FAMOTIDINE IN NACL 20-0.9 MG/50ML-% IV SOLN
INTRAVENOUS | Status: AC
  Filled 2020-01-26: qty 50

## 2020-01-26 MED ORDER — DIPHENHYDRAMINE HCL 50 MG/ML IJ SOLN
50.0000 mg | Freq: Once | INTRAMUSCULAR | Status: AC
  Administered 2020-01-26: 50 mg via INTRAVENOUS

## 2020-01-26 NOTE — Progress Notes (Signed)
[facility] [phone]   [phone]  OFFICE PROGRESS NOTE  Birdie Sons, MD [address] [place] [address]  DIAGNOSIS:  1) Stage IIIa (T1b, N2, M0) non-small cell lung cancer, adenocarcinoma presented with right lower lobe lung nodule in addition to right hilar and mediastinal lymphadenopathy diagnosed in March 2021. 2) history of a stage I (T1c, N0, M0 right breast adenocarcinoma with positive ER/PR status post right lumpectomy followed by adjuvant radiotherapy and currently on treatment with hormonal therapy with Femara.  Molecular studies by foundation 1: BRAF mutation V600E  PDL1 expression 100%.  PRIOR THERAPY: None.  CURRENT THERAPY: Concurrent chemoradiation with weekly carboplatin for AUC of 2 and paclitaxel 45 NG/M2.  Status post 2 cycles.  INTERVAL HISTORY: Elizabeth [name] 69 y.o. female returns to the clinic today for follow-up visit.  The patient is feeling fine today with no concerning complaints.  She has been tolerating her course of concurrent chemoradiation fairly well.  She denied having any current chest pain, shortness of breath, cough or hemoptysis.  She denied having any fever or chills.  She has no nausea, vomiting, diarrhea or constipation.  She has no headache or visual changes.  Dr. Sondra Come is planning to complete the course of radiotherapy by the end of this week because of her previous radiation to the right breast and the overlapping sites of radiation.  The patient is here today for evaluation before starting cycle #3 of her treatment.  MEDICAL HISTORY: Past Medical History:  Diagnosis Date  . Anginal pain ([place])   . Anxiety   . Cancer [facility])    breast cancer (radical lumpectomy and radiation  . Complication of anesthesia    one time woke when she had a tube down throat and had a panic attack  . Constipation due to opioid therapy   . Degenerative joint disease (DJD) of hip   . GERD  (gastroesophageal reflux disease)   . Headache    migraines when she was working  . Osteopenia   . Pneumonia   . Restless leg syndrome    takes Mirapex and xanax  . Scoliosis   . SUI (stress urinary incontinence, female)   . Vaso-vagal reaction    after back surgery    ALLERGIES:  is allergic to zostavax [zoster vaccine live]; naproxen; penicillins; zoloft [sertraline hcl]; and latex.  MEDICATIONS:  Current Outpatient Medications  Medication Sig Dispense Refill  . ALPRAZolam (XANAX) 0.5 MG tablet Take 3 tablets (1.5 mg total) by mouth at bedtime.    . calcium-vitamin D (OSCAL WITH D) 500-200 MG-UNIT tablet Take 1 tablet by mouth daily.    . clindamycin (CLEOCIN) 150 MG capsule Take 450 mg by mouth See admin instructions. Take before dental procedures    . Multiple Vitamins-Minerals (MULTIVITAMIN WITH MINERALS) tablet Take 1 tablet by mouth 2 (two) times daily.     Marland Kitchen omeprazole (PRILOSEC) 20 MG capsule Take 1 capsule (20 mg total) by mouth daily. 90 capsule 4  . pramipexole (MIRAPEX) 1 MG tablet TAKE 1 TABLET BY MOUTH AT BEDTIME 30 tablet 12  . prochlorperazine (COMPAZINE) 10 MG tablet Take 1 tablet (10 mg total) by mouth every 6 (six) hours as needed for nausea or vomiting. 30 tablet 0  . sucralfate (CARAFATE) 1 g tablet Take 1 tablet (1 g total) by mouth 4 (four) times daily -  with meals and at bedtime. Crush & dissolve in 55m of warm H20 prior to  swallowing 120 tablet 1  . tamoxifen (NOLVADEX) 20 MG tablet Take 1 tablet (20 mg total) by mouth daily. 90 tablet 3  . valACYclovir (VALTREX) 500 MG tablet Take 1 tablet (500 mg total) by mouth daily as needed (Cold sores).    . nitroGLYCERIN (NITROSTAT) 0.4 MG SL tablet Place 1 tablet (0.4 mg total) under the tongue every 5 (five) minutes as needed for chest pain. Go to ER if third tablet is necessary (Patient not taking: Reported on 12/31/2019) 30 tablet 5  . ondansetron (ZOFRAN) 4 MG tablet Take 1 tablet (4 mg total) by mouth every 8  (eight) hours as needed for nausea or vomiting. (Patient not taking: Reported on 12/31/2019) 20 tablet 0   No current facility-administered medications for this visit.    SURGICAL HISTORY:  Past Surgical History:  Procedure Laterality Date  . APPENDECTOMY  1963  . BACK SURGERY    . BREAST BIOPSY Right 04/27/2019   Affirm Bx "X" clip-INVASIVE MAMMARY CARCINOMA,  . BREAST CYST ASPIRATION Right 1988  . BREAST EXCISIONAL BIOPSY Right 1990   benign x 3  . BREAST LUMPECTOMY Right 05/08/2019  . BREAST LUMPECTOMY WITH SENTINEL LYMPH NODE BIOPSY Right 05/08/2019   Procedure: RIGHT BREAST LUMPECTOMY WITH SENTINEL LYMPH NODE BX AND WIRE LOC., LATEX ALLERGY;  Surgeon: Jules Husbands, MD;  Location: [facility];  Service: General;  Laterality: Right;  . BREAST SURGERY    . CATARACT EXTRACTION W/PHACO Right 03/31/2018   Procedure: CATARACT EXTRACTION PHACO AND INTRAOCULAR LENS PLACEMENT ([place]) RIGHT;  Surgeon: Leandrew Koyanagi, MD;  Location: [facility];  Service: Ophthalmology;  Laterality: Right;  Latex sensitivity  . CATARACT EXTRACTION W/PHACO Left 04/16/2018   Procedure: CATARACT EXTRACTION PHACO AND INTRAOCULAR LENS PLACEMENT ([place])  LEFT;  Surgeon: Leandrew Koyanagi, MD;  Location: [facility];  Service: Ophthalmology;  Laterality: Left;  . EYE SURGERY     Lasik  . FINE NEEDLE ASPIRATION  12/22/2019   Procedure: FINE NEEDLE ASPIRATION (FNA) LINEAR;  Surgeon: Collene Gobble, MD;  Location: [facility];  Service: Pulmonary;;  . FOOT SURGERY Bilateral   . HYSTEROTOMY    . LAMINECTOMY  2006  . ROTATOR CUFF REPAIR Bilateral   . TONSILLECTOMY     age 30  . TOTAL HIP ARTHROPLASTY Right 01/18/2015   Procedure: RIGHT TOTAL HIP ARTHROPLASTY ANTERIOR APPROACH;  Surgeon: Renette Butters, MD;  Location: [facility];  Service: Orthopedics;  Laterality: Right;  . TUBAL LIGATION    . VIDEO BRONCHOSCOPY WITH ENDOBRONCHIAL ULTRASOUND N/A 12/22/2019   Procedure: VIDEO BRONCHOSCOPY WITH  ENDOBRONCHIAL ULTRASOUND;  Surgeon: Collene Gobble, MD;  Location[facility];  Service: Pulmonary;  Laterality: N/A;    REVIEW OF SYSTEMS:  A comprehensive review of systems was negative.   PHYSICAL EXAMINATION: General appearance: alert, cooperative and no distress Head: Normocephalic, without obvious abnormality, atraumatic Neck: no adenopathy, no JVD, supple, symmetrical, trachea midline and thyroid not enlarged, symmetric, no tenderness/mass/nodules Lymph nodes: Cervical, supraclavicular, and axillary nodes normal. Resp: clear to auscultation bilaterally Back: symmetric, no curvature. ROM normal. No CVA tenderness. Cardio: regular rate and rhythm, S1, S2 normal, no murmur, click, rub or gallop GI: soft, non-tender; bowel sounds normal; no masses,  no organomegaly Extremities: extremities normal, atraumatic, no cyanosis or edema  ECOG PERFORMANCE STATUS: 1 - Symptomatic but completely ambulatory  Blood pressure (!) 116/54, pulse 86, temperature 98.9 F (37.2 C), temperature source Temporal, resp. rate 18, height '5\' 2"'$  (1.575 m), weight 175 lb 14.4 oz (79.8  kg), SpO2 99 %.  LABORATORY DATA: Lab Results  Component Value Date   WBC 3.8 (L) 01/26/2020   HGB 11.9 (L) 01/26/2020   HCT 36.5 01/26/2020   MCV 93.6 01/26/2020   PLT 229 01/26/2020      Chemistry      Component Value Date/Time   NA 139 01/26/2020 1125   NA 140 02/25/2019 0955   K 4.3 01/26/2020 1125   CL 107 01/26/2020 1125   CO2 22 01/26/2020 1125   BUN 12 01/26/2020 1125   BUN 16 02/25/2019 0955   CREATININE 0.64 01/26/2020 1125   CREATININE 0.70 09/10/2017 0848   GLU 91 09/28/2014 0000      Component Value Date/Time   CALCIUM 8.9 01/26/2020 1125   ALKPHOS 56 01/26/2020 1125   AST 19 01/26/2020 1125   ALT 25 01/26/2020 1125   BILITOT 0.3 01/26/2020 1125       RADIOGRAPHIC STUDIES: MR BRAIN W WO CONTRAST  Result Date: 01/04/2020 CLINICAL DATA:  Non-small cell lung cancer, staging EXAM: MRI HEAD  WITHOUT AND WITH CONTRAST TECHNIQUE: Multiplanar, multiecho pulse sequences of the brain and surrounding structures were obtained without and with intravenous contrast. CONTRAST:  64m GADAVIST GADOBUTROL 1 MMOL/ML IV SOLN COMPARISON:  None. FINDINGS: Brain: No acute infarction, hemorrhage, hydrocephalus, extra-axial collection or mass lesion. Few scattered foci of T2 hyperintensity are seen within the white matter of the cerebral hemispheres, nonspecific, most likely related to chronic small vessel ischemia. No focus of abnormal contrast enhancement identified. Vascular: Normal flow voids. Skull and upper cervical spine: Degenerative changes are noted in the visualized upper cervical spine. Focus of low signal in the C3 vertebral body is unchanged from prior MRI in 2 no evidence of contrast enhancement. Marrow signal is otherwise maintained. Sinuses/Orbits: Bilateral lens surgery. The orbits are otherwise maintained. Paranasal sinuses are clear. Other: Partial empty sella. IMPRESSION: 1. No evidence of intracranial metastatic disease. 2. Mild chronic ischemic changes of the white matter. Electronically Signed   By: KPedro EarlsM.D.   On: 01/04/2020 08:21    ASSESSMENT AND PLAN: This is a very pleasant 70years old white female with a stage IIIa non-small cell lung cancer, adenocarcinoma in March 2021 and presented with right lower lobe lung nodule in addition to right hilar and mediastinal lymphadenopathy.  Molecular studies showed positive for BRAF mutation V600E and PD-L1 expression was 100%. The patient also has a history of stage I right breast carcinoma and currently on treatment with Femara. She is currently undergoing a course of concurrent chemoradiation with weekly carboplatin and paclitaxel. The patient status post 2 cycles of treatment and has been tolerating her concurrent chemoradiation fairly well.  She is expected to complete the course of radiotherapy later this week because of  the previous radiotherapy to the right breast. I recommended for the patient to continue with the weekly chemotherapy for 2 more cycles. I will see her back for follow-up visit in 2 weeks for reevaluation and discussion of her treatment options. She was advised to call immediately if she has any concerning symptoms in the interval. The patient voices understanding of current disease status and treatment options and is in agreement with the current care plan.  All questions were answered. The patient knows to call the clinic with any problems, questions or concerns. We can certainly see the patient much sooner if necessary.  Disclaimer: This note was dictated with voice recognition software. Similar sounding words can inadvertently be transcribed and may  not be corrected upon review.

## 2020-01-26 NOTE — Progress Notes (Signed)
Oncology Nurse Navigator Documentation  Oncology Nurse Navigator Flowsheets 01/26/2020  Abnormal Finding Date -  Confirmed Diagnosis Date -  Diagnosis Status -  Planned Course of Treatment -  Phase of Treatment -  Chemo/Radiation Concurrent Actual Start Date: -  Navigator Follow Up Date: -  Navigator Follow Up Reason: -  Navigation Complete Date: -  Navigator Location [facility]  Referral Date to RadOnc/MedOnc -  Navigator Encounter Type Clinic/MDC/I spoke with patient today at clinic.  She is doing well without complaint. Dr. Julien Nordmann went over [place] [facility] results.  Both Dr. Julien Nordmann and myself spoke to her about the mychart and for her to call regarding urgent needs.  She verbalized understanding.   Telephone -  [facility] Date -  Multidisiplinary Clinic Type -  Treatment Initiated Date -  Patient Visit Type -  Treatment Phase Treatment  Barriers/Navigation Needs Education  Education Other  Interventions Education  Acuity Level 2-Minimal Needs (1-2 Barriers Identified)  Coordination of Care -  Education Method Verbal  Time Spent with Patient 15

## 2020-01-26 NOTE — Patient Instructions (Signed)
[facility] Discharge Instructions for Patients Receiving Chemotherapy  Today you received the following chemotherapy agents Taxol; carboplatin  To help prevent nausea and vomiting after your treatment, we encourage you to take your nausea medication as directed   If you develop nausea and vomiting that is not controlled by your nausea medication, call the clinic.   BELOW ARE SYMPTOMS THAT SHOULD BE REPORTED IMMEDIATELY:  *FEVER GREATER THAN 100.5 F  *CHILLS WITH OR WITHOUT FEVER  NAUSEA AND VOMITING THAT IS NOT CONTROLLED WITH YOUR NAUSEA MEDICATION  *UNUSUAL SHORTNESS OF BREATH  *UNUSUAL BRUISING OR BLEEDING  TENDERNESS IN MOUTH AND THROAT WITH OR WITHOUT PRESENCE OF ULCERS  *URINARY PROBLEMS  *BOWEL PROBLEMS  UNUSUAL RASH Items with * indicate a potential emergency and should be followed up as soon as possible.  Feel free to call the clinic should you have any questions or concerns. The clinic phone number is [phone].  Please show the [facility] at check-in to the Emergency Department and triage nurse.

## 2020-01-27 ENCOUNTER — Telehealth: Payer: Self-pay | Admitting: Internal Medicine

## 2020-01-27 ENCOUNTER — Other Ambulatory Visit: Payer: Self-pay

## 2020-01-27 ENCOUNTER — Ambulatory Visit
Admission: RE | Admit: 2020-01-27 | Discharge: 2020-01-27 | Disposition: A | Discharge status: Home / Self Care | Payer: Federal, State, Local not specified - PPO | Source: Ambulatory Visit | Attending: Radiation Oncology | Admitting: Radiation Oncology

## 2020-01-27 DIAGNOSIS — C3491 Malignant neoplasm of unspecified part of right bronchus or lung: Secondary | ICD-10-CM | POA: Diagnosis not present

## 2020-01-27 NOTE — Telephone Encounter (Signed)
Scheduled per los. Called and left msg. Mailed printout  °

## 2020-01-27 NOTE — Progress Notes (Signed)
Pharmacist Chemotherapy Monitoring - Follow Up Assessment    I verify that I have reviewed each item in the below checklist:  . Regimen for the patient is scheduled for the appropriate day and plan matches scheduled date. Marland Kitchen Appropriate non-routine labs are ordered dependent on drug ordered. . If applicable, additional medications reviewed and ordered per protocol based on lifetime cumulative doses and/or treatment regimen.   Plan for follow-up and/or issues identified: No . I-vent associated with next due treatment: No . MD and/or nursing notified: No  Philomena Course 01/27/2020 1:41 PM

## 2020-01-28 ENCOUNTER — Other Ambulatory Visit: Payer: Self-pay

## 2020-01-28 ENCOUNTER — Ambulatory Visit
Admission: RE | Admit: 2020-01-28 | Discharge: 2020-01-28 | Disposition: A | Discharge status: Home / Self Care | Payer: Federal, State, Local not specified - PPO | Source: Ambulatory Visit | Attending: Radiation Oncology | Admitting: Radiation Oncology

## 2020-01-28 DIAGNOSIS — C3491 Malignant neoplasm of unspecified part of right bronchus or lung: Secondary | ICD-10-CM | POA: Diagnosis not present

## 2020-01-29 ENCOUNTER — Other Ambulatory Visit: Payer: Self-pay

## 2020-01-29 ENCOUNTER — Encounter: Payer: Self-pay | Admitting: Radiation Oncology

## 2020-01-29 ENCOUNTER — Ambulatory Visit
Admission: RE | Admit: 2020-01-29 | Discharge: 2020-01-29 | Disposition: A | Discharge status: Home / Self Care | Payer: Federal, State, Local not specified - PPO | Source: Ambulatory Visit | Attending: Radiation Oncology | Admitting: Radiation Oncology

## 2020-01-29 DIAGNOSIS — C3491 Malignant neoplasm of unspecified part of right bronchus or lung: Secondary | ICD-10-CM | POA: Diagnosis not present

## 2020-02-01 ENCOUNTER — Ambulatory Visit: Payer: Federal, State, Local not specified - PPO

## 2020-02-02 ENCOUNTER — Other Ambulatory Visit: Payer: Self-pay

## 2020-02-02 ENCOUNTER — Inpatient Hospital Stay: Payer: Federal, State, Local not specified - PPO

## 2020-02-02 ENCOUNTER — Ambulatory Visit: Payer: Federal, State, Local not specified - PPO

## 2020-02-02 VITALS — BP 125/71 | HR 81 | Temp 98.5°F | Resp 18 | Wt 174.5 lb

## 2020-02-02 DIAGNOSIS — C3491 Malignant neoplasm of unspecified part of right bronchus or lung: Secondary | ICD-10-CM

## 2020-02-02 DIAGNOSIS — C3411 Malignant neoplasm of upper lobe, right bronchus or lung: Secondary | ICD-10-CM | POA: Diagnosis not present

## 2020-02-02 DIAGNOSIS — M810 Age-related osteoporosis without current pathological fracture: Secondary | ICD-10-CM

## 2020-02-02 LAB — CMP (CANCER CENTER ONLY)
ALT: 43 U/L (ref 0–44)
AST: 27 U/L (ref 15–41)
Albumin: 3.5 g/dL (ref 3.5–5.0)
Alkaline Phosphatase: 65 U/L (ref 38–126)
Anion gap: 12 (ref 5–15)
BUN: 10 mg/dL (ref 8–23)
CO2: 23 mmol/L (ref 22–32)
Calcium: 8.9 mg/dL (ref 8.9–10.3)
Chloride: 104 mmol/L (ref 98–111)
Creatinine: 0.65 mg/dL (ref 0.44–1.00)
GFR, Est AFR Am: >60 mL/min (ref >60)
GFR, Estimated: >60 mL/min (ref >60)
Glucose, Bld: 100 mg/dL — ABNORMAL HIGH (ref 70–99)
Potassium: 4.1 mmol/L (ref 3.5–5.1)
Sodium: 139 mmol/L (ref 135–145)
Total Bilirubin: 0.4 mg/dL (ref 0.3–1.2)
Total Protein: 6.8 g/dL (ref 6.5–8.1)

## 2020-02-02 LAB — CBC WITH DIFFERENTIAL (CANCER CENTER ONLY)
Abs Immature Granulocytes: 0.03 10*3/uL (ref 0.00–0.07)
Basophils Absolute: 0 10*3/uL (ref 0.0–0.1)
Basophils Relative: 0 %
Eosinophils Absolute: 0.1 10*3/uL (ref 0.0–0.5)
Eosinophils Relative: 1 %
HCT: 35.3 % — ABNORMAL LOW (ref 36.0–46.0)
Hemoglobin: 11.7 g/dL — ABNORMAL LOW (ref 12.0–15.0)
Immature Granulocytes: 1 %
Lymphocytes Relative: 11 %
Lymphs Abs: 0.4 10*3/uL — ABNORMAL LOW (ref 0.7–4.0)
MCH: 30.9 pg (ref 26.0–34.0)
MCHC: 33.1 g/dL (ref 30.0–36.0)
MCV: 93.1 fL (ref 80.0–100.0)
Monocytes Absolute: 0.4 10*3/uL (ref 0.1–1.0)
Monocytes Relative: 9 %
Neutro Abs: 2.9 10*3/uL (ref 1.7–7.7)
Neutrophils Relative %: 78 %
Platelet Count: 225 10*3/uL (ref 150–400)
RBC: 3.79 MIL/uL — ABNORMAL LOW (ref 3.87–5.11)
RDW: 13.1 % (ref 11.5–15.5)
WBC Count: 3.7 10*3/uL — ABNORMAL LOW (ref 4.0–10.5)
nRBC: 0 % (ref 0.0–0.2)

## 2020-02-02 MED ORDER — DIPHENHYDRAMINE HCL 50 MG/ML IJ SOLN
INTRAMUSCULAR | Status: AC
  Filled 2020-02-02: qty 1

## 2020-02-02 MED ORDER — DENOSUMAB 60 MG/ML ~~LOC~~ SOSY
60.0000 mg | PREFILLED_SYRINGE | Freq: Once | SUBCUTANEOUS | Status: AC
  Administered 2020-02-02: 60 mg via SUBCUTANEOUS

## 2020-02-02 MED ORDER — SODIUM CHLORIDE 0.9 % IV SOLN
189.4000 mg | Freq: Once | INTRAVENOUS | Status: AC
  Administered 2020-02-02: 190 mg via INTRAVENOUS
  Filled 2020-02-02: qty 19

## 2020-02-02 MED ORDER — FAMOTIDINE IN NACL 20-0.9 MG/50ML-% IV SOLN
INTRAVENOUS | Status: AC
  Filled 2020-02-02: qty 50

## 2020-02-02 MED ORDER — SODIUM CHLORIDE 0.9 % IV SOLN
45.0000 mg/m2 | Freq: Once | INTRAVENOUS | Status: AC
  Administered 2020-02-02: 84 mg via INTRAVENOUS
  Filled 2020-02-02: qty 14

## 2020-02-02 MED ORDER — DIPHENHYDRAMINE HCL 50 MG/ML IJ SOLN
50.0000 mg | Freq: Once | INTRAMUSCULAR | Status: AC
  Administered 2020-02-02: 50 mg via INTRAVENOUS

## 2020-02-02 MED ORDER — PALONOSETRON HCL INJECTION 0.25 MG/5ML
0.2500 mg | Freq: Once | INTRAVENOUS | Status: AC
  Administered 2020-02-02: 09:00:00 0.25 mg via INTRAVENOUS

## 2020-02-02 MED ORDER — SODIUM CHLORIDE 0.9 % IV SOLN
Freq: Once | INTRAVENOUS | Status: AC
  Filled 2020-02-02: qty 250

## 2020-02-02 MED ORDER — FAMOTIDINE IN NACL 20-0.9 MG/50ML-% IV SOLN
20.0000 mg | Freq: Once | INTRAVENOUS | Status: AC
  Administered 2020-02-02: 20 mg via INTRAVENOUS

## 2020-02-02 MED ORDER — SODIUM CHLORIDE 0.9 % IV SOLN
20.0000 mg | Freq: Once | INTRAVENOUS | Status: AC
  Administered 2020-02-02: 10:00:00 20 mg via INTRAVENOUS
  Filled 2020-02-02: qty 20

## 2020-02-02 MED ORDER — DENOSUMAB 60 MG/ML ~~LOC~~ SOSY
PREFILLED_SYRINGE | SUBCUTANEOUS | Status: AC
  Filled 2020-02-02: qty 1

## 2020-02-02 MED ORDER — PALONOSETRON HCL INJECTION 0.25 MG/5ML
INTRAVENOUS | Status: AC
  Filled 2020-02-02: qty 5

## 2020-02-02 NOTE — Patient Instructions (Addendum)
[facility] Discharge Instructions for Patients Receiving Chemotherapy  Today you received the following chemotherapy agents: Taxol/Carbo.  To help prevent nausea and vomiting after your treatment, we encourage you to take your nausea medication as directed.   If you develop nausea and vomiting that is not controlled by your nausea medication, call the clinic.   BELOW ARE SYMPTOMS THAT SHOULD BE REPORTED IMMEDIATELY:  *FEVER GREATER THAN 100.5 F  *CHILLS WITH OR WITHOUT FEVER  NAUSEA AND VOMITING THAT IS NOT CONTROLLED WITH YOUR NAUSEA MEDICATION  *UNUSUAL SHORTNESS OF BREATH  *UNUSUAL BRUISING OR BLEEDING  TENDERNESS IN MOUTH AND THROAT WITH OR WITHOUT PRESENCE OF ULCERS  *URINARY PROBLEMS  *BOWEL PROBLEMS  UNUSUAL RASH Items with * indicate a potential emergency and should be followed up as soon as possible.  Feel free to call the clinic should you have any questions or concerns. The clinic phone number is [phone].  Please show the [place] at check-in to the Emergency Department and triage nurse.  Denosumab injection What is this medicine? DENOSUMAB (den oh sue mab) slows bone breakdown. Prolia is used to treat osteoporosis in women after menopause and in men, and in people who are taking corticosteroids for 6 months or more. Delton See is used to treat a high calcium level due to cancer and to prevent bone fractures and other bone problems caused by multiple myeloma or cancer bone metastases. Delton See is also used to treat giant cell tumor of the bone. This medicine may be used for other purposes; ask your health care provider or pharmacist if you have questions. COMMON BRAND NAME(S): Prolia, XGEVA What should I tell my health care provider before I take this medicine? They need to know if you have any of these conditions:  dental disease  having surgery or tooth extraction  infection  kidney disease  low levels of calcium or Vitamin D in the  blood  malnutrition  on hemodialysis  skin conditions or sensitivity  thyroid or parathyroid disease  an unusual reaction to denosumab, other medicines, foods, dyes, or preservatives  pregnant or trying to get pregnant  breast-feeding How should I use this medicine? This medicine is for injection under the skin. It is given by a health care professional in a hospital or clinic setting. A special MedGuide will be given to you before each treatment. Be sure to read this information carefully each time. For Prolia, talk to your pediatrician regarding the use of this medicine in children. Special care may be needed. For Delton See, talk to your pediatrician regarding the use of this medicine in children. While this drug may be prescribed for children as young as 13 years for selected conditions, precautions do apply. Overdosage: If you think you have taken too much of this medicine contact a poison control center or emergency room at once. NOTE: This medicine is only for you. Do not share this medicine with others. What if I miss a dose? It is important not to miss your dose. Call your doctor or health care professional if you are unable to keep an appointment. What may interact with this medicine? Do not take this medicine with any of the following medications:  other medicines containing denosumab This medicine may also interact with the following medications:  medicines that lower your chance of fighting infection  steroid medicines like prednisone or cortisone This list may not describe all possible interactions. Give your health care provider a list of all the medicines, herbs, non-prescription  drugs, or dietary supplements you use. Also tell them if you smoke, drink alcohol, or use illegal drugs. Some items may interact with your medicine. What should I watch for while using this medicine? Visit your doctor or health care professional for regular checks on your progress. Your doctor or  health care professional may order blood tests and other tests to see how you are doing. Call your doctor or health care professional for advice if you get a fever, chills or sore throat, or other symptoms of a cold or flu. Do not treat yourself. This drug may decrease your body's ability to fight infection. Try to avoid being around people who are sick. You should make sure you get enough calcium and vitamin D while you are taking this medicine, unless your doctor tells you not to. Discuss the foods you eat and the vitamins you take with your health care professional. See your dentist regularly. Brush and floss your teeth as directed. Before you have any dental work done, tell your dentist you are receiving this medicine. Do not become pregnant while taking this medicine or for 5 months after stopping it. Talk with your doctor or health care professional about your birth control options while taking this medicine. Women should inform their doctor if they wish to become pregnant or think they might be pregnant. There is a potential for serious side effects to an unborn child. Talk to your health care professional or pharmacist for more information. What side effects may I notice from receiving this medicine? Side effects that you should report to your doctor or health care professional as soon as possible:  allergic reactions like skin rash, itching or hives, swelling of the face, lips, or tongue  bone pain  breathing problems  dizziness  jaw pain, especially after dental work  redness, blistering, peeling of the skin  signs and symptoms of infection like fever or chills; cough; sore throat; pain or trouble passing urine  signs of low calcium like fast heartbeat, muscle cramps or muscle pain; pain, tingling, numbness in the hands or feet; seizures  unusual bleeding or bruising  unusually weak or tired Side effects that usually do not require medical attention (report to your doctor or  health care professional if they continue or are bothersome):  constipation  diarrhea  headache  joint pain  loss of appetite  muscle pain  runny nose  tiredness  upset stomach This list may not describe all possible side effects. Call your doctor for medical advice about side effects. You may report side effects to FDA at 1-800-FDA-1088. Where should I keep my medicine? This medicine is only given in a clinic, doctor's office, or other health care setting and will not be stored at home. NOTE: This sheet is a summary. It may not cover all possible information. If you have questions about this medicine, talk to your doctor, pharmacist, or health care provider.  2020 Elsevier/Gold Standard (2018-01-31 16:10:44)

## 2020-02-02 NOTE — Progress Notes (Signed)
This encounter was created in error - please disregard.

## 2020-02-03 ENCOUNTER — Ambulatory Visit: Payer: Federal, State, Local not specified - PPO | Admitting: Radiation Oncology

## 2020-02-03 ENCOUNTER — Ambulatory Visit: Payer: Federal, State, Local not specified - PPO

## 2020-02-03 NOTE — Progress Notes (Signed)
Pharmacist Chemotherapy Monitoring - Follow Up Assessment    I verify that I have reviewed each item in the below checklist:  . Regimen for the patient is scheduled for the appropriate day and plan matches scheduled date. Marland Kitchen Appropriate non-routine labs are ordered dependent on drug ordered. . If applicable, additional medications reviewed and ordered per protocol based on lifetime cumulative doses and/or treatment regimen.   Plan for follow-up and/or issues identified: No . I-vent associated with next due treatment: No . MD and/or nursing notified: No  Elizabeth [name] [facility] 02/03/2020 3:44 PM

## 2020-02-04 ENCOUNTER — Ambulatory Visit: Payer: Federal, State, Local not specified - PPO

## 2020-02-05 ENCOUNTER — Ambulatory Visit: Payer: Federal, State, Local not specified - PPO

## 2020-02-06 NOTE — Progress Notes (Signed)
[place] OFFICE PROGRESS NOTE  Birdie Sons, MD [address] [place] [address]  DIAGNOSIS:  1) Stage IIIa (T1b, N2, M0) non-small cell lung cancer, adenocarcinoma presented with right lower lobe lung nodule in addition to right hilar and mediastinal lymphadenopathy diagnosed in March 2021. 2) history of a stage I (T1c, N0, M0 right breast adenocarcinoma with positive ER/PR status post right lumpectomy followed by adjuvant radiotherapy and currently on treatment with hormonal therapy with Femara.  Molecular studies by foundation 1: BRAF mutation V600E  PDL1 expression 100%.  PRIOR THERAPY: None  CURRENT THERAPY: Concurrent chemoradiation with weekly carboplatin for AUC of 2 and paclitaxel 45 NG/M2.  Status post 4 cycles. Completed radiation 01/2020  INTERVAL HISTORY: Elizabeth [name] 69 y.o. female returns to the clinic for a follow up visit. The patient is feeling well today without any concerning complaints. The patient continues to tolerate treatment with concurrent chemoradiation well without any adverse effects. She completed her last radiation treatment a few weeks ago. Dr. Sondra Come ended her radiation early due to her prior radiotherapy to her breast and overlaying sites of radiation. Denies any fever, chills, night sweats, or weight loss. Denies any chest pain, shortness of breath, cough, or hemoptysis. Denies any nausea, vomiting, or diarrhea. She reports constipation for which he uses prune juice and mild of magnesia. Denies any headache or visual changes. The esophagitis is getting better since completing radiation. The patient is here today for evaluation prior to starting cycle # 5.   MEDICAL HISTORY: Past Medical History:  Diagnosis Date  . Anginal pain ([place])   . Anxiety   . Cancer [facility])    breast cancer (radical lumpectomy and radiation  . Complication of anesthesia    one time woke when she had a tube down throat and had a  panic attack  . Constipation due to opioid therapy   . Degenerative joint disease (DJD) of hip   . GERD (gastroesophageal reflux disease)   . Headache    migraines when she was working  . Osteopenia   . Pneumonia   . Restless leg syndrome    takes Mirapex and xanax  . Scoliosis   . SUI (stress urinary incontinence, female)   . Vaso-vagal reaction    after back surgery    ALLERGIES:  is allergic to zostavax [zoster vaccine live]; naproxen; penicillins; zoloft [sertraline hcl]; and latex.  MEDICATIONS:  Current Outpatient Medications  Medication Sig Dispense Refill  . ALPRAZolam (XANAX) 0.5 MG tablet Take 3 tablets (1.5 mg total) by mouth at bedtime.    . calcium-vitamin D (OSCAL WITH D) 500-200 MG-UNIT tablet Take 1 tablet by mouth daily.    . Multiple Vitamins-Minerals (MULTIVITAMIN WITH MINERALS) tablet Take 1 tablet by mouth 2 (two) times daily.     . nitroGLYCERIN (NITROSTAT) 0.4 MG SL tablet Place 1 tablet (0.4 mg total) under the tongue every 5 (five) minutes as needed for chest pain. Go to ER if third tablet is necessary 30 tablet 5  . omeprazole (PRILOSEC) 20 MG capsule Take 1 capsule (20 mg total) by mouth daily. 90 capsule 4  . pramipexole (MIRAPEX) 1 MG tablet TAKE 1 TABLET BY MOUTH AT BEDTIME 30 tablet 12  . prochlorperazine (COMPAZINE) 10 MG tablet Take 1 tablet (10 mg total) by mouth every 6 (six) hours as needed for nausea or vomiting. 30 tablet 0  . sucralfate (CARAFATE) 1 g tablet Take 1 tablet (1 g total) by mouth 4 (four)  times daily -  with meals and at bedtime. Crush & dissolve in 56m of warm H20 prior to swallowing 120 tablet 1  . tamoxifen (NOLVADEX) 20 MG tablet Take 1 tablet (20 mg total) by mouth daily. 90 tablet 3  . valACYclovir (VALTREX) 500 MG tablet Take 1 tablet (500 mg total) by mouth daily as needed (Cold sores).    . clindamycin (CLEOCIN) 150 MG capsule Take 450 mg by mouth See admin instructions. Take before dental procedures    . ondansetron  (ZOFRAN) 4 MG tablet Take 1 tablet (4 mg total) by mouth every 8 (eight) hours as needed for nausea or vomiting. (Patient not taking: Reported on 12/31/2019) 20 tablet 0   No current facility-administered medications for this visit.    SURGICAL HISTORY:  Past Surgical History:  Procedure Laterality Date  . APPENDECTOMY  1963  . BACK SURGERY    . BREAST BIOPSY Right 04/27/2019   Affirm Bx "X" clip-INVASIVE MAMMARY CARCINOMA,  . BREAST CYST ASPIRATION Right 1988  . BREAST EXCISIONAL BIOPSY Right 1990   benign x 3  . BREAST LUMPECTOMY Right 05/08/2019  . BREAST LUMPECTOMY WITH SENTINEL LYMPH NODE BIOPSY Right 05/08/2019   Procedure: RIGHT BREAST LUMPECTOMY WITH SENTINEL LYMPH NODE BX AND WIRE LOC., LATEX ALLERGY;  Surgeon: PJules Husbands MD;  Location: [facility];  Service: General;  Laterality: Right;  . BREAST SURGERY    . CATARACT EXTRACTION W/PHACO Right 03/31/2018   Procedure: CATARACT EXTRACTION PHACO AND INTRAOCULAR LENS PLACEMENT ([place];  Surgeon: BLeandrew Koyanagi MD;  [place] [facility]  Service: Ophthalmology;  Laterality: Right;  Latex sensitivity  . CATARACT EXTRACTION W/PHACO Left 04/16/2018   Procedure: CATARACT EXTRACTION PHACO AND INTRAOCULAR LENS PLACEMENT ([place]  LEFT;  Surgeon: BLeandrew Koyanagi MD;  [place] [facility]  Service: Ophthalmology;  Laterality: Left;  . EYE SURGERY     Lasik  . FINE NEEDLE ASPIRATION  12/22/2019   Procedure: FINE NEEDLE ASPIRATION (FNA) LINEAR;  Surgeon: BCollene Gobble MD;  [facility];  Service: Pulmonary;;  . FOOT SURGERY Bilateral   . HYSTEROTOMY    . LAMINECTOMY  2006  . ROTATOR CUFF REPAIR Bilateral   . TONSILLECTOMY     age 69 . TOTAL HIP ARTHROPLASTY Right 01/18/2015   Procedure: RIGHT TOTAL HIP ARTHROPLASTY ANTERIOR APPROACH;  Surgeon: TRenette Butters MD;  [place] [facility]  Service: Orthopedics;  Laterality: Right;  . TUBAL LIGATION    . VIDEO BRONCHOSCOPY WITH ENDOBRONCHIAL  ULTRASOUND N/A 12/22/2019   Procedure: VIDEO BRONCHOSCOPY WITH ENDOBRONCHIAL ULTRASOUND;  Surgeon: BCollene Gobble MD;  [facility];  Service: Pulmonary;  Laterality: N/A;    REVIEW OF SYSTEMS:   Review of Systems  Constitutional: Negative for appetite change, chills, fatigue, fever and unexpected weight change.  HENT: Negative for mouth sores, nosebleeds, sore throat and trouble swallowing.   Eyes: Negative for eye problems and icterus.  Respiratory: Negative for cough, hemoptysis, shortness of breath and wheezing.  Cardiovascular: Negative for chest pain and leg swelling.  Gastrointestinal: Positive for constipation. Negative for abdominal pain, diarrhea, nausea and vomiting.  Genitourinary: Negative for bladder incontinence, difficulty urinating, dysuria, frequency and hematuria.   Musculoskeletal: Negative for back pain, gait problem, neck pain and neck stiffness.  Skin: Negative for itching and rash.  Neurological: Negative for dizziness, extremity weakness, gait problem, headaches, light-headedness and seizures.  Hematological: Negative for adenopathy. Does not bruise/bleed easily.  Psychiatric/Behavioral: Negative for confusion, depression and sleep disturbance. The patient  is not nervous/anxious.     PHYSICAL EXAMINATION:  Blood pressure 128/69, pulse 98, temperature 98.2 F (36.8 C), temperature source Oral, resp. rate 19, height '5\' 2"'$  (1.575 m), weight 177 lb 1.6 oz (80.3 kg), SpO2 99 %.  ECOG PERFORMANCE STATUS: 1 - Symptomatic but completely ambulatory  Physical Exam  Constitutional: Oriented to person, place, and time and well-developed, well-nourished, and in no distress.  HENT:  Head: Normocephalic and atraumatic.  Mouth/Throat: Oropharynx is clear and moist. No oropharyngeal exudate.  Eyes: Conjunctivae are normal. Right eye exhibits no discharge. Left eye exhibits no discharge. No scleral icterus.  Neck: Normal range of motion. Neck supple.  Cardiovascular:  Normal rate, regular rhythm, normal heart sounds and intact distal pulses.   Pulmonary/Chest: Effort normal. No respiratory distress. No rales.  Abdominal: Soft. Bowel sounds are normal. Exhibits no distension and no mass. There is no tenderness.  Musculoskeletal: Normal range of motion. Exhibits no edema.  Lymphadenopathy:    No cervical adenopathy.  Neurological: Alert and oriented to person, place, and time. Exhibits normal muscle tone. Gait normal. Coordination normal.  Skin: Skin is warm and dry. No rash noted. Not diaphoretic. No erythema. No pallor.  Psychiatric: Mood, memory and judgment normal.  Vitals reviewed.  LABORATORY DATA: Lab Results  Component Value Date   WBC 4.7 02/09/2020   HGB 11.6 (L) 02/09/2020   HCT 35.4 (L) 02/09/2020   MCV 92.9 02/09/2020   PLT 194 02/09/2020      Chemistry      Component Value Date/Time   NA 138 02/09/2020 0810   NA 140 02/25/2019 0955   K 4.4 02/09/2020 0810   CL 105 02/09/2020 0810   CO2 24 02/09/2020 0810   BUN 11 02/09/2020 0810   BUN 16 02/25/2019 0955   CREATININE 0.64 02/09/2020 0810   CREATININE 0.70 09/10/2017 0848   GLU 91 09/28/2014 0000      Component Value Date/Time   CALCIUM 9.3 02/09/2020 0810   ALKPHOS 67 02/09/2020 0810   AST 19 02/09/2020 0810   ALT 32 02/09/2020 0810   BILITOT 0.4 02/09/2020 0810       RADIOGRAPHIC STUDIES:  No results found.   ASSESSMENT/PLAN:  This is a very pleasant 69 year old Caucasian female with stage IIIA non-small cell lung cancer, adenocarcinoma. She presented with a right lower lobe lung nodule in addition to right hilar and mediastinal lymphadenopathy. She was diagnosed in March 2021. Her molecular studies show BRAF mutation V600E and PD-L1 expression was 100%.  She also has a history of stage I right breast cancer and is currently on treatment with tamoxifen.   She is currently undergoing concurrent chemoradiation with weekly carboplatin for an AUC of 2 and paclitaxel  45 mg/m2. She is status post 4 cycles. She completed radiation early under the care of Dr. Sondra Come due to her prior radiotherapy to her breast and the overlapping radiation sites. She is due for her last chemotherapy treatment today.    Labs were reviewed. Recommend that she proceed with cycle #5 today as scheduled.   I will arrange for her restaging CT scan in 2 weeks   We will see her back for a follow up visit in 2.5 weeks for evaluation and to review her scan.   The patient was advised to call immediately if she has any concerning symptoms in the interval. The patient voices understanding of current disease status and treatment options and is in agreement with the current care plan. All questions  were answered. The patient knows to call the clinic with any problems, questions or concerns. We can certainly see the patient much sooner if necessary     Orders Placed This Encounter  Procedures  . CT Chest W Contrast    Standing Status:   Future    Standing Expiration Date:   02/08/2021    Order Specific Question:   ** REASON FOR EXAM (FREE TEXT)    Answer:   Restaging Lung Cancer    Order Specific Question:   If indicated for the ordered procedure, I authorize the administration of contrast media per Radiology protocol    Answer:   Yes    Order Specific Question:   Preferred imaging location?    Answer:   [facility]    Order Specific Question:   Radiology Contrast Protocol - do NOT remove file path    Answer:   [email or web address]     [place], PA-C 02/09/20

## 2020-02-08 ENCOUNTER — Ambulatory Visit: Payer: Federal, State, Local not specified - PPO

## 2020-02-09 ENCOUNTER — Ambulatory Visit: Payer: Federal, State, Local not specified - PPO

## 2020-02-09 ENCOUNTER — Other Ambulatory Visit: Payer: Self-pay

## 2020-02-09 ENCOUNTER — Inpatient Hospital Stay: Payer: Federal, State, Local not specified - PPO

## 2020-02-09 ENCOUNTER — Inpatient Hospital Stay: Payer: Federal, State, Local not specified - PPO | Attending: Physician Assistant | Admitting: Physician Assistant

## 2020-02-09 VITALS — BP 128/69 | HR 98 | Temp 98.2°F | Resp 19 | Ht 62.0 in | Wt 177.1 lb

## 2020-02-09 DIAGNOSIS — F419 Anxiety disorder, unspecified: Secondary | ICD-10-CM | POA: Diagnosis not present

## 2020-02-09 DIAGNOSIS — G2581 Restless legs syndrome: Secondary | ICD-10-CM | POA: Diagnosis not present

## 2020-02-09 DIAGNOSIS — C3491 Malignant neoplasm of unspecified part of right bronchus or lung: Secondary | ICD-10-CM

## 2020-02-09 DIAGNOSIS — Z9221 Personal history of antineoplastic chemotherapy: Secondary | ICD-10-CM | POA: Diagnosis not present

## 2020-02-09 DIAGNOSIS — M8580 Other specified disorders of bone density and structure, unspecified site: Secondary | ICD-10-CM | POA: Insufficient documentation

## 2020-02-09 DIAGNOSIS — Z79811 Long term (current) use of aromatase inhibitors: Secondary | ICD-10-CM | POA: Insufficient documentation

## 2020-02-09 DIAGNOSIS — Z79899 Other long term (current) drug therapy: Secondary | ICD-10-CM | POA: Diagnosis not present

## 2020-02-09 DIAGNOSIS — C3431 Malignant neoplasm of lower lobe, right bronchus or lung: Secondary | ICD-10-CM | POA: Insufficient documentation

## 2020-02-09 DIAGNOSIS — Z923 Personal history of irradiation: Secondary | ICD-10-CM | POA: Diagnosis not present

## 2020-02-09 DIAGNOSIS — Z5111 Encounter for antineoplastic chemotherapy: Secondary | ICD-10-CM

## 2020-02-09 DIAGNOSIS — C50811 Malignant neoplasm of overlapping sites of right female breast: Secondary | ICD-10-CM | POA: Insufficient documentation

## 2020-02-09 DIAGNOSIS — Z170 Estrogen receptor positive status [ER+]: Secondary | ICD-10-CM | POA: Diagnosis not present

## 2020-02-09 LAB — CBC WITH DIFFERENTIAL (CANCER CENTER ONLY)
Abs Immature Granulocytes: 0.02 10*3/uL (ref 0.00–0.07)
Basophils Absolute: 0 10*3/uL (ref 0.0–0.1)
Basophils Relative: 0 %
Eosinophils Absolute: 0.1 10*3/uL (ref 0.0–0.5)
Eosinophils Relative: 1 %
HCT: 35.4 % — ABNORMAL LOW (ref 36.0–46.0)
Hemoglobin: 11.6 g/dL — ABNORMAL LOW (ref 12.0–15.0)
Immature Granulocytes: 0 %
Lymphocytes Relative: 11 %
Lymphs Abs: 0.5 10*3/uL — ABNORMAL LOW (ref 0.7–4.0)
MCH: 30.4 pg (ref 26.0–34.0)
MCHC: 32.8 g/dL (ref 30.0–36.0)
MCV: 92.9 fL (ref 80.0–100.0)
Monocytes Absolute: 0.4 10*3/uL (ref 0.1–1.0)
Monocytes Relative: 9 %
Neutro Abs: 3.7 10*3/uL (ref 1.7–7.7)
Neutrophils Relative %: 79 %
Platelet Count: 194 10*3/uL (ref 150–400)
RBC: 3.81 MIL/uL — ABNORMAL LOW (ref 3.87–5.11)
RDW: 13.8 % (ref 11.5–15.5)
WBC Count: 4.7 10*3/uL (ref 4.0–10.5)
nRBC: 0 % (ref 0.0–0.2)

## 2020-02-09 LAB — CMP (CANCER CENTER ONLY)
ALT: 32 U/L (ref 0–44)
AST: 19 U/L (ref 15–41)
Albumin: 3.4 g/dL — ABNORMAL LOW (ref 3.5–5.0)
Alkaline Phosphatase: 67 U/L (ref 38–126)
Anion gap: 9 (ref 5–15)
BUN: 11 mg/dL (ref 8–23)
CO2: 24 mmol/L (ref 22–32)
Calcium: 9.3 mg/dL (ref 8.9–10.3)
Chloride: 105 mmol/L (ref 98–111)
Creatinine: 0.64 mg/dL (ref 0.44–1.00)
GFR, Est AFR Am: >60 mL/min (ref >60)
GFR, Estimated: >60 mL/min (ref >60)
Glucose, Bld: 101 mg/dL — ABNORMAL HIGH (ref 70–99)
Potassium: 4.4 mmol/L (ref 3.5–5.1)
Sodium: 138 mmol/L (ref 135–145)
Total Bilirubin: 0.4 mg/dL (ref 0.3–1.2)
Total Protein: 6.8 g/dL (ref 6.5–8.1)

## 2020-02-09 MED ORDER — PALONOSETRON HCL INJECTION 0.25 MG/5ML
INTRAVENOUS | Status: AC
  Filled 2020-02-09: qty 5

## 2020-02-09 MED ORDER — FAMOTIDINE IN NACL 20-0.9 MG/50ML-% IV SOLN
20.0000 mg | Freq: Once | INTRAVENOUS | Status: AC
  Administered 2020-02-09: 10:00:00 20 mg via INTRAVENOUS

## 2020-02-09 MED ORDER — PALONOSETRON HCL INJECTION 0.25 MG/5ML
0.2500 mg | Freq: Once | INTRAVENOUS | Status: AC
  Administered 2020-02-09: 10:00:00 0.25 mg via INTRAVENOUS

## 2020-02-09 MED ORDER — SODIUM CHLORIDE 0.9 % IV SOLN
20.0000 mg | Freq: Once | INTRAVENOUS | Status: AC
  Administered 2020-02-09: 10:00:00 20 mg via INTRAVENOUS
  Filled 2020-02-09: qty 20

## 2020-02-09 MED ORDER — FAMOTIDINE IN NACL 20-0.9 MG/50ML-% IV SOLN
INTRAVENOUS | Status: AC
  Filled 2020-02-09: qty 50

## 2020-02-09 MED ORDER — SODIUM CHLORIDE 0.9 % IV SOLN
45.0000 mg/m2 | Freq: Once | INTRAVENOUS | Status: AC
  Administered 2020-02-09: 11:00:00 84 mg via INTRAVENOUS
  Filled 2020-02-09: qty 14

## 2020-02-09 MED ORDER — SODIUM CHLORIDE 0.9 % IV SOLN
Freq: Once | INTRAVENOUS | Status: AC
  Filled 2020-02-09: qty 250

## 2020-02-09 MED ORDER — SODIUM CHLORIDE 0.9 % IV SOLN
189.4000 mg | Freq: Once | INTRAVENOUS | Status: AC
  Administered 2020-02-09: 12:00:00 190 mg via INTRAVENOUS
  Filled 2020-02-09: qty 19

## 2020-02-09 MED ORDER — DIPHENHYDRAMINE HCL 50 MG/ML IJ SOLN
INTRAMUSCULAR | Status: AC
  Filled 2020-02-09: qty 1

## 2020-02-09 MED ORDER — DIPHENHYDRAMINE HCL 50 MG/ML IJ SOLN
50.0000 mg | Freq: Once | INTRAMUSCULAR | Status: AC
  Administered 2020-02-09: 10:00:00 50 mg via INTRAVENOUS

## 2020-02-09 NOTE — Patient Instructions (Signed)
   [facility] Discharge Instructions for Patients Receiving Chemotherapy  Today you received the following chemotherapy agents Taxol and Carboplatin   To help prevent nausea and vomiting after your treatment, we encourage you to take your nausea medication as directed.    If you develop nausea and vomiting that is not controlled by your nausea medication, call the clinic.   BELOW ARE SYMPTOMS THAT SHOULD BE REPORTED IMMEDIATELY:  *FEVER GREATER THAN 100.5 F  *CHILLS WITH OR WITHOUT FEVER  NAUSEA AND VOMITING THAT IS NOT CONTROLLED WITH YOUR NAUSEA MEDICATION  *UNUSUAL SHORTNESS OF BREATH  *UNUSUAL BRUISING OR BLEEDING  TENDERNESS IN MOUTH AND THROAT WITH OR WITHOUT PRESENCE OF ULCERS  *URINARY PROBLEMS  *BOWEL PROBLEMS  UNUSUAL RASH Items with * indicate a potential emergency and should be followed up as soon as possible.  Feel free to call the clinic should you have any questions or concerns. The clinic phone number is [phone].  Please show the CHEMO ALERT CARD at check-in to the Emergency Department and triage nurse.   

## 2020-02-10 ENCOUNTER — Ambulatory Visit: Payer: Federal, State, Local not specified - PPO

## 2020-02-10 ENCOUNTER — Telehealth: Payer: Self-pay | Admitting: Physician Assistant

## 2020-02-10 NOTE — Telephone Encounter (Signed)
Scheduled per los. Called and spoke with patient. Confirmed appt 

## 2020-02-11 ENCOUNTER — Telehealth: Payer: Self-pay | Admitting: *Deleted

## 2020-02-11 ENCOUNTER — Ambulatory Visit: Payer: Federal, State, Local not specified - PPO

## 2020-02-11 NOTE — Telephone Encounter (Signed)
Returned call to patient to answer previous question.  Explained that before she had PET Scan not CT.  She has CT ordered but not scheduled as of yet.  Explained that PET has a minimal threshold that would not be detected on imaging.  Advised that CT of chest will give [place] entire picture of her chest whereas bronchoscopy would only give [place] tissue samples for that specific location that was collected.     Patient expressed appreciation for explanation and denied any further questions at this time.

## 2020-02-11 NOTE — Telephone Encounter (Signed)
Patient called with question.  She states she was contacted to schedule her follow up CT.  She stated that previously she had a CT that showed an area of concern but did not show the 7 nodules that were found only when she had the bronchoscopy.  She is asking why we are going to rely on a repeat CT to determine if treatment was successful when the previous CT was incomplete in its findings.  Questions why wouldn't she just have another bronchoscopy to determine if it was successful.     Routed question to [facility], [facility] to advise or call patient back directly to explain.

## 2020-02-12 ENCOUNTER — Ambulatory Visit: Payer: Federal, State, Local not specified - PPO

## 2020-02-14 ENCOUNTER — Ambulatory Visit (INDEPENDENT_AMBULATORY_CARE_PROVIDER_SITE_OTHER)
Admission: RE | Admit: 2020-02-14 | Discharge: 2020-02-14 | Disposition: A | Discharge status: Home / Self Care | Payer: Federal, State, Local not specified - PPO | Source: Ambulatory Visit

## 2020-02-14 DIAGNOSIS — M25522 Pain in left elbow: Secondary | ICD-10-CM

## 2020-02-14 MED ORDER — PREDNISONE 10 MG (21) PO TBPK
ORAL_TABLET | ORAL | 0 refills | Status: DC
Start: 2020-02-14 — End: 2020-02-25

## 2020-02-14 NOTE — ED Provider Notes (Signed)
Virtual Visit via Video Note:  Elizabeth [name]  initiated request for Telemedicine visit with [facility] Urgent Care team. I connected with Elizabeth [name]  on 02/14/2020 at 0835 AM for a synchronized telemedicine visit using a video enabled HIPPA compliant telemedicine application. I verified that I am speaking with Elizabeth [name]  using two identifiers. Emerson Monte, FNP  was physically located in a [facility]gent care site and Elizabeth [name] was located at a different location.   The limitations of evaluation and management by telemedicine as well as the availability of in-person appointments were discussed. Patient was informed that she  may incur a bill ( including co-pay) for this virtual visit encounter. Elizabeth [name]  expressed understanding and gave verbal consent to proceed with virtual visit.     History of Present Illness:Elizabeth [name]  is a 69 y.o. female who presented via telehealth with a complaint of left elbow swelling, redness and painful for the past few days.  Patient reports symptom has decreased for the past 2 days.  Denies any precipitating event.  Reports she has a history of lung cancer and currently in treatment with radiation and chemotherapy.  Last chemo was Tuesday.  Denies similar symptoms in the past.  Denies chills, fever, nausea, vomiting, diarrhea, trauma, injury  Past Medical History:  Diagnosis Date  . Anginal pain ([place])   . Anxiety   . Cancer [facility])    breast cancer (radical lumpectomy and radiation  . Complication of anesthesia    one time woke when she had a tube down throat and had a panic attack  . Constipation due to opioid therapy   . Degenerative joint disease (DJD) of hip   . GERD (gastroesophageal reflux disease)   . Headache    migraines when she was working  . Osteopenia   . Pneumonia   . Restless leg syndrome    takes Mirapex and xanax  . Scoliosis   . SUI  (stress urinary incontinence, female)   . Vaso-vagal reaction    after back surgery    Allergies  Allergen Reactions  . Zostavax [Zoster Vaccine Live] Rash  . Naproxen Hives  . Penicillins Hives    Did it involve swelling of the face/tongue/throat, SOB, or low BP? No Did it involve sudden or severe rash/hives, skin peeling, or any reaction on the inside of your mouth or nose? Yes Did you need to seek medical attention at a hospital or doctor's office? Yes When did it last happen?14 or 15 If all above answers are "NO", may proceed with cephalosporin use.   . Zoloft [Sertraline Hcl] Hives  . Latex Itching and Rash    Condoms        Observations/Objective:  VITALS: Per patient if applicable, see vitals. GENERAL: Alert, appears well and in no acute distress. HEENT: Atraumatic, conjunctiva clear, no obvious abnormalities on inspection of external nose and ears. NECK: Normal movements of the head and neck. CARDIOPULMONARY: No increased WOB. Speaking in clear sentences. I:E ratio WNL.  MS: Moves all visible extremities without noticeable abnormality. PSYCH: Pleasant and cooperative, well-groomed. Speech normal rate and rhythm. Affect is appropriate. Insight and judgement are appropriate. Attention is focused, linear, and appropriate.  NEURO:  Oriented as arrived to appointment on time with no prompting. Moves both UE equally.    Assessment and Plan:    ICD-10-CM   1. Left elbow pain  M25.522       Follow Up Instructions: Follow-up with PCP Return for a  face-to-face visit if symptom does not resolve   I discussed the assessment and treatment plan with the patient. The patient was provided an opportunity to ask questions and all were answered. The patient agreed with the plan and demonstrated an understanding of the instructions.   The patient was advised to call back or seek an in-person evaluation if the symptoms worsen or if the condition fails to improve as  anticipated.  I provided 15 minutes of non-face-to-face time during this encounter.    Emerson Monte, FNP  02/14/2020 8:51 AM        Emerson Monte, FNP 02/14/20 [phone]

## 2020-02-14 NOTE — Discharge Instructions (Addendum)
Follow-up with PCP Return for a face-to-face visit if symptom does not resolve

## 2020-02-15 ENCOUNTER — Ambulatory Visit: Payer: Federal, State, Local not specified - PPO

## 2020-02-16 ENCOUNTER — Ambulatory Visit: Payer: Federal, State, Local not specified - PPO

## 2020-02-16 ENCOUNTER — Other Ambulatory Visit: Payer: Federal, State, Local not specified - PPO

## 2020-02-17 ENCOUNTER — Ambulatory Visit: Payer: Federal, State, Local not specified - PPO

## 2020-02-18 ENCOUNTER — Ambulatory Visit: Payer: Federal, State, Local not specified - PPO

## 2020-02-19 ENCOUNTER — Ambulatory Visit: Payer: Federal, State, Local not specified - PPO

## 2020-02-22 ENCOUNTER — Ambulatory Visit: Payer: Federal, State, Local not specified - PPO

## 2020-02-23 ENCOUNTER — Ambulatory Visit: Payer: Federal, State, Local not specified - PPO

## 2020-02-23 ENCOUNTER — Other Ambulatory Visit: Payer: Self-pay

## 2020-02-23 ENCOUNTER — Ambulatory Visit: Payer: Federal, State, Local not specified - PPO | Admitting: Internal Medicine

## 2020-02-23 ENCOUNTER — Other Ambulatory Visit: Payer: Federal, State, Local not specified - PPO

## 2020-02-23 ENCOUNTER — Ambulatory Visit (HOSPITAL_COMMUNITY)
Admission: RE | Admit: 2020-02-23 | Discharge: 2020-02-23 | Disposition: A | Discharge status: Home / Self Care | Payer: Federal, State, Local not specified - PPO | Source: Ambulatory Visit | Attending: Physician Assistant | Admitting: Physician Assistant

## 2020-02-23 DIAGNOSIS — C3491 Malignant neoplasm of unspecified part of right bronchus or lung: Secondary | ICD-10-CM | POA: Insufficient documentation

## 2020-02-23 MED ORDER — IOHEXOL 300 MG/ML  SOLN
75.0000 mL | Freq: Once | INTRAMUSCULAR | Status: AC | PRN
  Administered 2020-02-23: 75 mL via INTRAVENOUS

## 2020-02-23 MED ORDER — SODIUM CHLORIDE (PF) 0.9 % IJ SOLN
INTRAMUSCULAR | Status: AC
  Filled 2020-02-23: qty 50

## 2020-02-24 ENCOUNTER — Ambulatory Visit: Payer: Federal, State, Local not specified - PPO

## 2020-02-24 NOTE — Progress Notes (Signed)
[place] OFFICE PROGRESS NOTE  Birdie Sons, MD [address] [place] [address]  DIAGNOSIS:  1) Stage IIIa (T1b, N2, M0) non-small cell lung cancer, adenocarcinoma presented with right lower lobe lung nodule in addition to right hilar and mediastinal lymphadenopathy diagnosed in March 2021. 2) history of a stage I (T1c, N0, M0 right breast adenocarcinoma with positive ER/PR status post right lumpectomy followed by adjuvant radiotherapy and currently on treatment with hormonal therapy with Femara.  Molecular studies by foundation 1: BRAF mutation V600E  PDL1 expression 100%.  PRIOR THERAPY:  1) Concurrent chemoradiation with weekly carboplatin for AUC of 2 and paclitaxel 45 NG/M2. Status post 5cycles. Completed radiation 01/2020. Completed chemotherapy on 02/09/20  CURRENT THERAPY: Consolidation immunotherapy with Imfinzi 1500 mg IV every 4 weeks.  First dose expected on 03/03/2020.  INTERVAL HISTORY: Elizabeth [name] 69 y.o. female returns to the clinic for a follow up visit. The patient is feeling well today without any concerning complaints. The patient completed concurrent chemoradiation with weekly carboplatin and paclitaxel.  She felt a little worn out after her last treatment but feels significantly improved at this time and she has felt particularly well over the last 2 weeks.  Her bowel movements have returned to being regular.  Her swallowing issues have resolved and her fatigue has improved.  She completed radiation treatment several weeks earlier due to her prior radiotherapy to her breast and overlaying sites of radiation. Today, she denies any fever, chills, night sweats, or weight loss. Denies any chest pain, cough, or hemoptysis. She has some baseline shortness of breath with exertion. Denies any nausea, vomiting, or diarrhea. Denies any more constipation. Denies any headache or visual changes. She recently had a restaging CT scan.  The patient is here today for evaluation and to review her scan results.    MEDICAL HISTORY: Past Medical History:  Diagnosis Date  . Anginal pain ([place])   . Anxiety   . Cancer [facility])    breast cancer (radical lumpectomy and radiation  . Complication of anesthesia    one time woke when she had a tube down throat and had a panic attack  . Constipation due to opioid therapy   . Degenerative joint disease (DJD) of hip   . GERD (gastroesophageal reflux disease)   . Headache    migraines when she was working  . Osteopenia   . Pneumonia   . Restless leg syndrome    takes Mirapex and xanax  . Scoliosis   . SUI (stress urinary incontinence, female)   . Vaso-vagal reaction    after back surgery    ALLERGIES:  is allergic to zostavax [zoster vaccine live]; naproxen; penicillins; zoloft [sertraline hcl]; and latex.  MEDICATIONS:  Current Outpatient Medications  Medication Sig Dispense Refill  . ALPRAZolam (XANAX) 0.5 MG tablet Take 3 tablets (1.5 mg total) by mouth at bedtime.    . calcium-vitamin D (OSCAL WITH D) 500-200 MG-UNIT tablet Take 1 tablet by mouth daily.    . Multiple Vitamins-Minerals (MULTIVITAMIN WITH MINERALS) tablet Take 1 tablet by mouth 2 (two) times daily.     . nitroGLYCERIN (NITROSTAT) 0.4 MG SL tablet Place 1 tablet (0.4 mg total) under the tongue every 5 (five) minutes as needed for chest pain. Go to ER if third tablet is necessary 30 tablet 5  . omeprazole (PRILOSEC) 20 MG capsule Take 1 capsule (20 mg total) by mouth daily. 90 capsule 4  . pramipexole (MIRAPEX) 1 MG tablet  TAKE 1 TABLET BY MOUTH AT BEDTIME 30 tablet 12  . tamoxifen (NOLVADEX) 20 MG tablet Take 1 tablet (20 mg total) by mouth daily. 90 tablet 3  . valACYclovir (VALTREX) 500 MG tablet Take 1 tablet (500 mg total) by mouth daily as needed (Cold sores).    . clindamycin (CLEOCIN) 150 MG capsule Take 450 mg by mouth See admin instructions. Take before dental procedures    . ondansetron (ZOFRAN) 4 MG  tablet Take 1 tablet (4 mg total) by mouth every 8 (eight) hours as needed for nausea or vomiting. (Patient not taking: Reported on 12/31/2019) 20 tablet 0  . prochlorperazine (COMPAZINE) 10 MG tablet Take 1 tablet (10 mg total) by mouth every 6 (six) hours as needed for nausea or vomiting. (Patient not taking: Reported on 02/25/2020) 30 tablet 0   No current facility-administered medications for this visit.    SURGICAL HISTORY:  Past Surgical History:  Procedure Laterality Date  . APPENDECTOMY  1963  . BACK SURGERY    . BREAST BIOPSY Right 04/27/2019   Affirm Bx "X" clip-INVASIVE MAMMARY CARCINOMA,  . BREAST CYST ASPIRATION Right 1988  . BREAST EXCISIONAL BIOPSY Right 1990   benign x 3  . BREAST LUMPECTOMY Right 05/08/2019  . BREAST LUMPECTOMY WITH SENTINEL LYMPH NODE BIOPSY Right 05/08/2019   Procedure: RIGHT BREAST LUMPECTOMY WITH SENTINEL LYMPH NODE BX AND WIRE LOC., LATEX ALLERGY;  Surgeon: Jules Husbands, MD;  Location: [facility];  Service: General;  Laterality: Right;  . BREAST SURGERY    . CATARACT EXTRACTION W/PHACO Right 03/31/2018   Procedure: CATARACT EXTRACTION PHACO AND INTRAOCULAR LENS PLACEMENT ([place]) RIGHT;  Surgeon: Leandrew Koyanagi, MD;  Location: [facility];  Service: Ophthalmology;  Laterality: Right;  Latex sensitivity  . CATARACT EXTRACTION W/PHACO Left 04/16/2018   Procedure: CATARACT EXTRACTION PHACO AND INTRAOCULAR LENS PLACEMENT ([place])  LEFT;  Surgeon: Leandrew Koyanagi, MD;  Location: [facility];  Service: Ophthalmology;  Laterality: Left;  . EYE SURGERY     Lasik  . FINE NEEDLE ASPIRATION  12/22/2019   Procedure: FINE NEEDLE ASPIRATION (FNA) LINEAR;  Surgeon: Collene Gobble, MD;  Location: [facility];  Service: Pulmonary;;  . FOOT SURGERY Bilateral   . HYSTEROTOMY    . LAMINECTOMY  2006  . ROTATOR CUFF REPAIR Bilateral   . TONSILLECTOMY     age 18  . TOTAL HIP ARTHROPLASTY Right 01/18/2015   Procedure: RIGHT TOTAL HIP ARTHROPLASTY  ANTERIOR APPROACH;  Surgeon: Renette Butters, MD;  Location: [facility];  Service: Orthopedics;  Laterality: Right;  . TUBAL LIGATION    . VIDEO BRONCHOSCOPY WITH ENDOBRONCHIAL ULTRASOUND N/A 12/22/2019   Procedure: VIDEO BRONCHOSCOPY WITH ENDOBRONCHIAL ULTRASOUND;  Surgeon: Collene Gobble, MD;  Location: [facility];  Service: Pulmonary;  Laterality: N/A;    REVIEW OF SYSTEMS:   Review of Systems  Constitutional: Negative for appetite change, chills, fatigue, fever and unexpected weight change.  HENT: Negative for mouth sores, nosebleeds, sore throat and trouble swallowing.   Eyes: Negative for eye problems and icterus.  Respiratory: Positive for mild baseline to improved dyspnea on exertion. negative for cough, hemoptysis,  and wheezing.   Cardiovascular: Negative for chest pain and leg swelling.  Gastrointestinal: Negative for abdominal pain, constipation, diarrhea, nausea and vomiting.  Genitourinary: Negative for bladder incontinence, difficulty urinating, dysuria, frequency and hematuria.   Musculoskeletal: Negative for back pain, gait problem, neck pain and neck stiffness.  Skin: Negative for itching and rash.  Neurological: Negative for dizziness, extremity  weakness, gait problem, headaches, light-headedness and seizures.  Hematological: Negative for adenopathy. Does not bruise/bleed easily.  Psychiatric/Behavioral: Negative for confusion, depression and sleep disturbance. The patient is not nervous/anxious.     PHYSICAL EXAMINATION:  Blood pressure 125/68, pulse 92, temperature 97.9 F (36.6 C), temperature source Temporal, resp. rate 18, height '5\' 2"'$  (1.575 m), weight 180 lb 8 oz (81.9 kg), SpO2 99 %.  ECOG PERFORMANCE STATUS: 1 - Symptomatic but completely ambulatory  Physical Exam  Constitutional: Oriented to person, place, and time and well-developed, well-nourished, and in no distress.  HENT:  Head: Normocephalic and atraumatic.  Mouth/Throat: Oropharynx is clear and moist.  No oropharyngeal exudate.  Eyes: Conjunctivae are normal. Right eye exhibits no discharge. Left eye exhibits no discharge. No scleral icterus.  Neck: Normal range of motion. Neck supple.  Cardiovascular: Normal rate, regular rhythm, normal heart sounds and intact distal pulses.   Pulmonary/Chest: Effort normal and breath sounds normal. No respiratory distress. No wheezes. No rales.  Abdominal: Soft. Bowel sounds are normal. Exhibits no distension and no mass. There is no tenderness.  Musculoskeletal: Normal range of motion. Exhibits no edema.  Lymphadenopathy:    No cervical adenopathy.  Neurological: Alert and oriented to person, place, and time. Exhibits normal muscle tone. Gait normal. Coordination normal.  Skin: Skin is warm and dry. No rash noted. Not diaphoretic. No erythema. No pallor.  Psychiatric: Mood, memory and judgment normal.  Vitals reviewed.  LABORATORY DATA: Lab Results  Component Value Date   WBC 4.7 02/09/2020   HGB 11.6 (L) 02/09/2020   HCT 35.4 (L) 02/09/2020   MCV 92.9 02/09/2020   PLT 194 02/09/2020      Chemistry      Component Value Date/Time   NA 138 02/09/2020 0810   NA 140 02/25/2019 0955   K 4.4 02/09/2020 0810   CL 105 02/09/2020 0810   CO2 24 02/09/2020 0810   BUN 11 02/09/2020 0810   BUN 16 02/25/2019 0955   CREATININE 0.64 02/09/2020 0810   CREATININE 0.70 09/10/2017 0848   GLU 91 09/28/2014 0000      Component Value Date/Time   CALCIUM 9.3 02/09/2020 0810   ALKPHOS 67 02/09/2020 0810   AST 19 02/09/2020 0810   ALT 32 02/09/2020 0810   BILITOT 0.4 02/09/2020 0810       RADIOGRAPHIC STUDIES:  CT Chest W Contrast  Result Date: 02/23/2020 CLINICAL DATA:  Primary Cancer Type: Lung Imaging Indication: Assess response to therapy. RLL nodule seen on PET-CT for breast cancer. Interval therapy since last imaging? Yes Initial Cancer Diagnosis Date: 12/22/2019  Established by: Biopsy-proven Detailed Pathology: Stage IIIa non-small cell lung  cancer, adenocarcinoma Primary Tumor location: Right lower lobe Chemotherapy: Yes; Ongoing? Yes; Most recent administration: 02/09/2020 Radiation therapy? Yes; Date Range: 01/14/2020-01/19/2020; Target: Right lung Other Cancers: Stage I right breast adenocarcinoma status post right lumpectomy followed by adjuvant radiotherapy and currently on treatment with hormonal therapy with Femara. EXAM: CT CHEST WITH CONTRAST TECHNIQUE: Multidetector CT imaging of the chest was performed during intravenous contrast administration. CONTRAST:  37m OMNIPAQUE IOHEXOL 300 MG/ML  SOLN COMPARISON:  PET-CT 12/07/2019. FINDINGS: Cardiovascular: Normal heart size. No significant pericardial effusion/thickening. Atherosclerotic nonaneurysmal thoracic aorta. Normal caliber pulmonary arteries. No central pulmonary emboli. Mediastinum/Nodes: No discrete thyroid nodules. Unremarkable esophagus. Surgical clips in the right axilla. No axillary adenopathy. Previously enlarged right paratracheal lymph nodes have decreased, for example measuring 0.9 cm (series 2/image 40), previously 1.7 cm on 12/07/2019 PET-CT. However there is new ill-defined  right paratracheal/right tracheal wall soft tissue with eccentric right tracheal wall thickening up to 1.2 cm thickness (series 2/image 33). Mildly enlarged 1.0 cm subcarinal node (series 2/image 59), previously 1.5 cm, decreased. No pathologically enlarged hilar lymph nodes. Lungs/Pleura: No pneumothorax. No pleural effusion. Irregular 1.2 x 0.9 cm medial basilar right lower lobe pulmonary nodule (series 7/image 100), previously 1.4 x 1.1 cm on 12/07/2019 PET-CT, slightly decreased. Clustered small nodular opacities in the medial basilar left lower lobe are not substantially changed and are favored to represent mucoid impaction within dilated peripheral airways. No new significant pulmonary nodules. New mild sharply marginated patchy bandlike subpleural consolidation and ground-glass opacity in the  anterior right mid lung (series 7/image 71), compatible with evolving radiation change. New mild patchy ground-glass opacity in the posterior right upper lobe is also probably treatment related. Upper abdomen: Small hiatal hernia.  Simple 2.0 cm left liver cyst. Musculoskeletal: No aggressive appearing focal osseous lesions. Moderate dextrocurvature of the thoracic spine with moderate thoracic spondylosis. IMPRESSION: 1. Irregular medial basilar right lower lobe pulmonary nodule is slightly decreased. 2. Previously visualized enlarged right paratracheal and subcarinal lymph nodes on 12/07/2019 PET-CT study are decreased. 3. New ill-defined right paratracheal/right tracheal wall soft tissue. Findings are nonspecific and could be inflammatory due to post treatment change or new metastatic disease. Recommend attention on short-term follow-up chest CT with IV contrast in 3 months. 4. Evolving radiation change in the anterior right mid lung. 5. Small hiatal hernia. 6. Aortic Atherosclerosis (ICD10-I70.0). Electronically Signed   By: Ilona Sorrel M.D.   On: 02/23/2020 11:01     ASSESSMENT/PLAN:  This is a very pleasant 69 year old Caucasian female with stage IIIA non-small cell lung cancer, adenocarcinoma. She presented with a right lower lobe lung nodule in addition to right hilar and mediastinal lymphadenopathy. She was diagnosed in March 2021. Her molecular studies show BRAF mutation V600Eand PD-L1 expression was 100%.  She also has a history of stage I right breast cancer and is currently on treatment with tamoxifen.   She is currently undergoing concurrent chemoradiation with weekly carboplatin for an AUC of 2 and paclitaxel 45 mg/m2. She is status post 5 cycles. She completed radiation early under the care of Dr. Sondra Come due to her prior radiotherapy to her breast and the overlapping radiation sites. Her last treatment of chemotherapy was given on 02/09/2020  The patient recently had a restaging CT scan  performed.  Dr. Julien Nordmann personally and independently reviewed the scan and discussed the results with the patient.  The scan showed slight decrease in the irregular medial basilar right lower lobe pulmonary nodule.  The scan also noted a decrease in size in the deviously visualized right paratracheal and subcarinal lymphadenopathy.   Dr. Julien Nordmann had a lengthy discussion with the patient about her current condition and treatment options.  Dr. Julien Nordmann recommends that the patient undergo treatment with consolidation immunotherapy with Imfinzi 1500 mg IV every 4 weeks.  The patient is interested in this option and she is expected to receive her first dose on 03/03/20  I discussed with her the adverse effect of the immunotherapy including but not limited to immunotherapy mediated skin rash, diarrhea, inflammation of the lung, kidney, liver, thyroid or other endocrine dysfunction  We will see the patient back for follow-up visit in 5 weeks for evaluation for starting cycle #2 treatment  Regarding the aortic atherosclerosis noted on her CT scan, Dr. Earlie Server encouraged the patient to follow with her family doctor for routine blood  work and [facility] of risk factors such as diabetes, hypertension, hyperlipidemia, etc.  Of note, the patient mentions that she is planning on increasing her activity with walking.  The patient was advised to call immediately if she has any concerning symptoms in the interval. The patient voices understanding of current disease status and treatment options and is in agreement with the current care plan. All questions were answered. The patient knows to call the clinic with any problems, questions or concerns. We can certainly see the patient much sooner if necessary      Orders Placed This Encounter  Procedures  . CBC with Differential (Cancer Center Only)    Standing Status:   Standing    Number of Occurrences:   12    Standing Expiration Date:   02/24/2021  . CMP ([facility] only)    Standing Status:   Standing    Number of Occurrences:   12    Standing Expiration Date:   02/24/2021  . TSH    Standing Status:   Standing    Number of Occurrences:   12    Standing Expiration Date:   02/24/2021     Tobe Sos Sovereign Ramiro, PA-C 02/25/20  ADDENDUM: Hematology/Oncology Attending: I had a face-to-face encounter with the patient today.  I recommended her care plan.  This is a very pleasant 69 years old white female diagnosed with stage IIIa non-small cell lung cancer, adenocarcinoma presented with right lower lobe lung nodule in addition to right hilar and mediastinal lymphadenopathy with PD-L1 expression of 100% in March 2021.  The patient also has BRAF V600E mutation. She completed a course of concurrent chemoradiation with weekly carboplatin and paclitaxel.  Her course of radiation was short because of the previous radiotherapy to the right breast area for history of breast cancer. The patient had repeat CT scan of the chest performed recently.  I personally and independently reviewed the scans and discussed the results with the patient today. Her scan showed improvement of her disease with decrease in the size of the pulmonary nodule as well as the mediastinal lymphadenopathy. I had a lengthy discussion with the patient today about her future treatment options.  I gave the patient the option of observation versus treatment with consolidation immunotherapy with Imfinzi 1500 mg IV every 4 weeks for a total of 1 year. The patient is interested in the treatment with Imfinzi.  I discussed with her the progression free survival as well as the overall survival of this treatment option. I also discussed with the patient the adverse effect of this treatment including but not limited to immunotherapy mediated skin rash, diarrhea, inflammation of the lung, kidney, liver, thyroid or other endocrine dysfunction. She is expected to start the first cycle of this treatment  next week. The patient will come back for follow-up visit in 5 weeks for evaluation before starting cycle #2. She was advised to call immediately if she has any concerning symptoms in the interval.  Disclaimer: This note was dictated with voice recognition software. Similar sounding words can inadvertently be transcribed and may be missed upon review. Eilleen Kempf, MD 02/25/20

## 2020-02-25 ENCOUNTER — Encounter: Payer: Self-pay | Admitting: Physician Assistant

## 2020-02-25 ENCOUNTER — Inpatient Hospital Stay: Payer: Federal, State, Local not specified - PPO | Admitting: Physician Assistant

## 2020-02-25 ENCOUNTER — Other Ambulatory Visit: Payer: Self-pay | Admitting: Internal Medicine

## 2020-02-25 ENCOUNTER — Other Ambulatory Visit: Payer: Self-pay

## 2020-02-25 VITALS — BP 125/68 | HR 92 | Temp 97.9°F | Resp 18 | Ht 62.0 in | Wt 180.5 lb

## 2020-02-25 DIAGNOSIS — C3491 Malignant neoplasm of unspecified part of right bronchus or lung: Secondary | ICD-10-CM

## 2020-02-25 DIAGNOSIS — Z7189 Other specified counseling: Secondary | ICD-10-CM | POA: Diagnosis not present

## 2020-02-25 DIAGNOSIS — C3431 Malignant neoplasm of lower lobe, right bronchus or lung: Secondary | ICD-10-CM | POA: Diagnosis not present

## 2020-02-25 NOTE — Patient Instructions (Addendum)
Durvalumab injection What is this medicine? DURVALUMAB (dur VAL ue mab) is a monoclonal antibody. It is used to treat urothelial cancer and lung cancer. This medicine may be used for other purposes; ask your health care provider or pharmacist if you have questions. COMMON BRAND NAME(S): IMFINZI What should I tell my health care provider before I take this medicine? They need to know if you have any of these conditions:  diabetes  immune system problems  infection  inflammatory bowel disease  kidney disease  liver disease  lung or breathing disease  lupus  organ transplant  stomach or intestine problems  thyroid disease  an unusual or allergic reaction to durvalumab, other medicines, foods, dyes, or preservatives  pregnant or trying to get pregnant  breast-feeding How should I use this medicine? This medicine is for infusion into a vein. It is given by a health care professional in a hospital or clinic setting. A special MedGuide will be given to you before each treatment. Be sure to read this information carefully each time. Talk to your pediatrician regarding the use of this medicine in children. Special care may be needed. Overdosage: If you think you have taken too much of this medicine contact a poison control center or emergency room at once. NOTE: This medicine is only for you. Do not share this medicine with others. What if I miss a dose? It is important not to miss your dose. Call your doctor or health care professional if you are unable to keep an appointment. What may interact with this medicine? Interactions have not been studied. This list may not describe all possible interactions. Give your health care provider a list of all the medicines, herbs, non-prescription drugs, or dietary supplements you use. Also tell them if you smoke, drink alcohol, or use illegal drugs. Some items may interact with your medicine. What should I watch for while using this  medicine? This drug may make you feel generally unwell. Continue your course of treatment even though you feel ill unless your doctor tells you to stop. You may need blood work done while you are taking this medicine. Do not become pregnant while taking this medicine or for 3 months after stopping it. Women should inform their doctor if they wish to become pregnant or think they might be pregnant. There is a potential for serious side effects to an unborn child. Talk to your health care professional or pharmacist for more information. Do not breast-feed an infant while taking this medicine or for 3 months after stopping it. What side effects may I notice from receiving this medicine? Side effects that you should report to your doctor or health care professional as soon as possible:  allergic reactions like skin rash, itching or hives, swelling of the face, lips, or tongue  black, tarry stools  bloody or watery diarrhea  breathing problems  change in emotions or moods  change in sex drive  changes in vision  chest pain or chest tightness  chills  confusion  cough  facial flushing  fever  headache  signs and symptoms of high blood sugar such as dizziness; dry mouth; dry skin; fruity breath; nausea; stomach pain; increased hunger or thirst; increased urination  signs and symptoms of liver injury like dark yellow or brown urine; general ill feeling or flu-like symptoms; light-colored stools; loss of appetite; nausea; right upper belly pain; unusually weak or tired; yellowing of the eyes or skin  stomach pain  trouble passing urine or change in  the amount of urine  weight gain or weight loss Side effects that usually do not require medical attention (report these to your doctor or health care professional if they continue or are bothersome):  bone pain  constipation  loss of appetite  muscle pain  nausea  swelling of the ankles, feet, hands  tiredness This list  may not describe all possible side effects. Call your doctor for medical advice about side effects. You may report side effects to FDA at 1-800-FDA-1088. Where should I keep my medicine? This drug is given in a hospital or clinic and will not be stored at home. NOTE: This sheet is a summary. It may not cover all possible information. If you have questions about this medicine, talk to your doctor, pharmacist, or health care provider.  2020 Elsevier/Gold Standard (2016-12-04 19:25:04)

## 2020-02-25 NOTE — Progress Notes (Signed)
DISCONTINUE ON PATHWAY REGIMEN - Non-Small Cell Lung     Administer weekly:     Paclitaxel      Carboplatin   **Always confirm dose/schedule in your pharmacy ordering system**  REASON: Continuation Of Treatment PRIOR TREATMENT: [id number]: Carboplatin AUC=2 + Paclitaxel 45 mg/m2 Weekly During Radiation TREATMENT RESPONSE: Partial Response (PR)  START ON PATHWAY REGIMEN - Non-Small Cell Lung     A cycle is every 28 days:     Durvalumab   **Always confirm dose/schedule in your pharmacy ordering system**  Patient Characteristics: Preoperative or Nonsurgical Candidate (Clinical Staging), Stage III - Nonsurgical Candidate, PS = 0, 1 Therapeutic Status: Preoperative or Nonsurgical Candidate (Clinical Staging) AJCC T Category: cT1a AJCC N Category: cN2 AJCC M Category: cM0 AJCC 8 Stage Grouping: IIIA ECOG Performance Status: 1 Intent of Therapy: Curative Intent, Discussed with Patient

## 2020-02-26 ENCOUNTER — Telehealth: Payer: Self-pay | Admitting: Physician Assistant

## 2020-02-26 NOTE — Telephone Encounter (Signed)
Scheduled appts per 5/20 los. Pt confirmed appt dates and times.

## 2020-02-29 ENCOUNTER — Other Ambulatory Visit: Payer: Federal, State, Local not specified - PPO

## 2020-02-29 ENCOUNTER — Ambulatory Visit: Payer: Federal, State, Local not specified - PPO | Admitting: Hematology and Oncology

## 2020-03-01 ENCOUNTER — Telehealth: Payer: Self-pay | Admitting: *Deleted

## 2020-03-01 NOTE — Telephone Encounter (Signed)
CALLED PATIENT TO INFORM OF FU BEING MOVED TO 03-10-20 @ 9 AM, DUE TO DR. KINARD BEING IN THE OR, LVM FOR  A RETURN CALL

## 2020-03-02 NOTE — Progress Notes (Incomplete)
  Patient Name: Elizabeth [name] MRN: [id number] DOB: 08-24-1951 Referring Physician: Curt Bears (Profile Not Attached) Date of Service: 01/29/2020 [place], [place]                                                        End Of Treatment Note  Diagnoses: C34.31-Malignant neoplasm of lower lobe, right bronchus or lung C34.31-Malignant neoplasm of lower lobe, right bronchus or lung C50.411-Malignant neoplasm of upper-outer quadrant of right female breast C50.411-Malignant neoplasm of upper-outer quadrant of right female breast  Cancer Staging: Stage IIIa (T1b, N2, M0) non-small cell lung cancer, adenocarcinoma presented with right lower lobe lung nodule in addition to right hilar and mediastinal lymphadenopathy diagnosed in March of 2021  Intent: Curative  Radiation Treatment Dates: 01/14/2020 through 01/29/2020 Site Technique Total Dose (Gy) Dose per Fx (Gy) Completed Fx Beam Energies  Lung, Right: Lung_Rt IMRT 24/24 2 12/12 6X   Narrative: The patient tolerated radiation therapy relatively well. She did report some mild fatigue and early esophageal symptoms for which she was prescribed Carafate. She denied cough and hemoptysis.  Plan: The patient will follow-up with radiation oncology in one month.  ________________________________________________   Blair Promise, PhD, MD  This document serves as a record of services personally performed by Gery Pray, MD. It was created on his behalf by Clerance Lav, a trained medical scribe. The creation of this record is based on the scribe's personal observations and the provider's statements to them. This document has been checked and approved by the attending provider.

## 2020-03-03 ENCOUNTER — Inpatient Hospital Stay: Payer: Federal, State, Local not specified - PPO

## 2020-03-03 ENCOUNTER — Other Ambulatory Visit: Payer: Self-pay | Admitting: Medical

## 2020-03-03 ENCOUNTER — Ambulatory Visit: Payer: Federal, State, Local not specified - PPO | Admitting: Radiation Oncology

## 2020-03-03 ENCOUNTER — Other Ambulatory Visit: Payer: Self-pay

## 2020-03-03 ENCOUNTER — Inpatient Hospital Stay (HOSPITAL_BASED_OUTPATIENT_CLINIC_OR_DEPARTMENT_OTHER): Payer: Federal, State, Local not specified - PPO | Admitting: Medical

## 2020-03-03 VITALS — BP 133/70 | HR 99 | Temp 98.0°F | Resp 18 | Wt 180.8 lb

## 2020-03-03 DIAGNOSIS — C3491 Malignant neoplasm of unspecified part of right bronchus or lung: Secondary | ICD-10-CM

## 2020-03-03 DIAGNOSIS — L03114 Cellulitis of left upper limb: Secondary | ICD-10-CM

## 2020-03-03 DIAGNOSIS — C3431 Malignant neoplasm of lower lobe, right bronchus or lung: Secondary | ICD-10-CM | POA: Diagnosis not present

## 2020-03-03 LAB — CMP (CANCER CENTER ONLY)
ALT: 32 U/L (ref 0–44)
AST: 28 U/L (ref 15–41)
Albumin: 4.1 g/dL (ref 3.5–5.0)
Alkaline Phosphatase: 57 U/L (ref 38–126)
Anion gap: 12 (ref 5–15)
BUN: 20 mg/dL (ref 8–23)
CO2: 24 mmol/L (ref 22–32)
Calcium: 9.1 mg/dL (ref 8.9–10.3)
Chloride: 105 mmol/L (ref 98–111)
Creatinine: 0.75 mg/dL (ref 0.44–1.00)
GFR, Est AFR Am: >60 mL/min (ref >60)
GFR, Estimated: >60 mL/min (ref >60)
Glucose, Bld: 111 mg/dL — ABNORMAL HIGH (ref 70–99)
Potassium: 4 mmol/L (ref 3.5–5.1)
Sodium: 141 mmol/L (ref 135–145)
Total Bilirubin: 0.6 mg/dL (ref 0.3–1.2)
Total Protein: 7.4 g/dL (ref 6.5–8.1)

## 2020-03-03 LAB — CBC WITH DIFFERENTIAL (CANCER CENTER ONLY)
Abs Immature Granulocytes: 0.02 10*3/uL (ref 0.00–0.07)
Basophils Absolute: 0 10*3/uL (ref 0.0–0.1)
Basophils Relative: 0 %
Eosinophils Absolute: 0 10*3/uL (ref 0.0–0.5)
Eosinophils Relative: 1 %
HCT: 37.3 % (ref 36.0–46.0)
Hemoglobin: 12 g/dL (ref 12.0–15.0)
Immature Granulocytes: 0 %
Lymphocytes Relative: 16 %
Lymphs Abs: 0.8 10*3/uL (ref 0.7–4.0)
MCH: 31.2 pg (ref 26.0–34.0)
MCHC: 32.2 g/dL (ref 30.0–36.0)
MCV: 96.9 fL (ref 80.0–100.0)
Monocytes Absolute: 0.4 10*3/uL (ref 0.1–1.0)
Monocytes Relative: 8 %
Neutro Abs: 3.7 10*3/uL (ref 1.7–7.7)
Neutrophils Relative %: 75 %
Platelet Count: 222 10*3/uL (ref 150–400)
RBC: 3.85 MIL/uL — ABNORMAL LOW (ref 3.87–5.11)
RDW: 16.3 % — ABNORMAL HIGH (ref 11.5–15.5)
WBC Count: 5 10*3/uL (ref 4.0–10.5)
nRBC: 0 % (ref 0.0–0.2)

## 2020-03-03 MED ORDER — SODIUM CHLORIDE 0.9 % IV SOLN
Freq: Once | INTRAVENOUS | Status: AC
  Filled 2020-03-03: qty 250

## 2020-03-03 MED ORDER — SULFAMETHOXAZOLE-TRIMETHOPRIM 800-160 MG PO TABS: 1.0000 | ORAL_TABLET | Freq: Two times a day (BID) | ORAL | 0 refills | Status: DC

## 2020-03-03 MED ORDER — SODIUM CHLORIDE 0.9 % IV SOLN
1500.0000 mg | Freq: Once | INTRAVENOUS | Status: AC
  Administered 2020-03-03: 1500 mg via INTRAVENOUS
  Filled 2020-03-03: qty 30

## 2020-03-03 NOTE — Patient Instructions (Signed)
Durvalumab injection What is this medicine? DURVALUMAB (dur VAL ue mab) is a monoclonal antibody. It is used to treat urothelial cancer and lung cancer. This medicine may be used for other purposes; ask your health care provider or pharmacist if you have questions. COMMON BRAND NAME(S): IMFINZI What should I tell my health care provider before I take this medicine? They need to know if you have any of these conditions:  diabetes  immune system problems  infection  inflammatory bowel disease  kidney disease  liver disease  lung or breathing disease  lupus  organ transplant  stomach or intestine problems  thyroid disease  an unusual or allergic reaction to durvalumab, other medicines, foods, dyes, or preservatives  pregnant or trying to get pregnant  breast-feeding How should I use this medicine? This medicine is for infusion into a vein. It is given by a health care professional in a hospital or clinic setting. A special MedGuide will be given to you before each treatment. Be sure to read this information carefully each time. Talk to your pediatrician regarding the use of this medicine in children. Special care may be needed. Overdosage: If you think you have taken too much of this medicine contact a poison control center or emergency room at once. NOTE: This medicine is only for you. Do not share this medicine with others. What if I miss a dose? It is important not to miss your dose. Call your doctor or health care professional if you are unable to keep an appointment. What may interact with this medicine? Interactions have not been studied. This list may not describe all possible interactions. Give your health care provider a list of all the medicines, herbs, non-prescription drugs, or dietary supplements you use. Also tell them if you smoke, drink alcohol, or use illegal drugs. Some items may interact with your medicine. What should I watch for while using this  medicine? This drug may make you feel generally unwell. Continue your course of treatment even though you feel ill unless your doctor tells you to stop. You may need blood work done while you are taking this medicine. Do not become pregnant while taking this medicine or for 3 months after stopping it. Women should inform their doctor if they wish to become pregnant or think they might be pregnant. There is a potential for serious side effects to an unborn child. Talk to your health care professional or pharmacist for more information. Do not breast-feed an infant while taking this medicine or for 3 months after stopping it. What side effects may I notice from receiving this medicine? Side effects that you should report to your doctor or health care professional as soon as possible:  allergic reactions like skin rash, itching or hives, swelling of the face, lips, or tongue  black, tarry stools  bloody or watery diarrhea  breathing problems  change in emotions or moods  change in sex drive  changes in vision  chest pain or chest tightness  chills  confusion  cough  facial flushing  fever  headache  signs and symptoms of high blood sugar such as dizziness; dry mouth; dry skin; fruity breath; nausea; stomach pain; increased hunger or thirst; increased urination  signs and symptoms of liver injury like dark yellow or brown urine; general ill feeling or flu-like symptoms; light-colored stools; loss of appetite; nausea; right upper belly pain; unusually weak or tired; yellowing of the eyes or skin  stomach pain  trouble passing urine or change in  the amount of urine  weight gain or weight loss Side effects that usually do not require medical attention (report these to your doctor or health care professional if they continue or are bothersome):  bone pain  constipation  loss of appetite  muscle pain  nausea  swelling of the ankles, feet, hands  tiredness This list  may not describe all possible side effects. Call your doctor for medical advice about side effects. You may report side effects to FDA at 1-800-FDA-1088. Where should I keep my medicine? This drug is given in a hospital or clinic and will not be stored at home. NOTE: This sheet is a summary. It may not cover all possible information. If you have questions about this medicine, talk to your doctor, pharmacist, or health care provider.  2020 Elsevier/Gold Standard (2016-12-04 19:25:04)

## 2020-03-04 ENCOUNTER — Telehealth: Payer: Self-pay | Admitting: *Deleted

## 2020-03-04 LAB — TSH: TSH: 0.967 u[IU]/mL (ref 0.308–3.960)

## 2020-03-04 NOTE — Progress Notes (Signed)
This patient was seen in the infusion room today.  She had inflammation of the bursa of her left elbow which was recently treated with a prednisone taper.  She reports that despite this she has developed warmth, erythema, and pain at the site.  There has been no discharge from the area.  The patient was examined and was found to have an enlarged bursa on her left elbow with erythema, increased warmth, and tenderness.  She was given a prescription for Bactrim DS p.o. twice daily x7 days.  She was told to push fluids.  She expressed understanding and agreement with this plan.  Sandi Mealy, MHS, PA-C Physician Assistant

## 2020-03-09 NOTE — Progress Notes (Signed)
Radiation Oncology         [phone] ________________________________  Name: Elizabeth [name] MRN: [id number]  Date: 03/10/2020  DOB: [birth year]  Follow-Up Visit Note  CC: Birdie Sons, MD  Curt Bears, MD    ICD-10-CM   1. Adenocarcinoma of right lung, stage 3 (HCC)  C34.91     Diagnosis: Stage IIIa (T1b, N2, M0) non-small cell lung cancer, adenocarcinoma presented with right lower lobe lung nodule in addition to right hilar and mediastinal lymphadenopathy diagnosed in March of 2021  Interval Since Last Radiation: One month, one week, and four days.  Radiation Treatment Dates: 01/14/2020 through 01/29/2020 Site Technique Total Dose (Gy) Dose per Fx (Gy) Completed Fx Beam Energies  Lung, Right: Lung_Rt IMRT 24/24 2 12/12 6X    Narrative:  The patient returns today for routine follow-up. Since the end of treatment, she underwent a chest CT on 02/23/2020. Results showed an irregular medial basilar right lower lobe pulmonary nodule that was slightly decreased in size. The previously visualized enlarged right paratracheal and subcarinal lymph nodes had also decreased. Additionally, there was some evolving radiation change in the right anterior mid lung. Finally, there was new ill-defined right paratracheal/right tracheal wall soft tissue that was non-specific and could be inflammatory due to post-treatment change or new metastatic disease. Repeat chest CT was recommended in three months.  She was last seen by Dr. Julien Nordmann on 02/25/2020. She began consolidation immunotherapy with Imfinzi on 03/03/2020.  Of note, the patient is completing a course of Bactrim for left elbow bursitis.  On review of systems, she reports mild fatigue and shortness of breath with increased exertion. She denies pain, cough, difficulty swallowing, and skin changes.  ALLERGIES:  is allergic to zostavax [zoster vaccine live]; naproxen; penicillins; zoloft [sertraline hcl]; and  latex.  Meds: Current Outpatient Medications  Medication Sig Dispense Refill  . Multiple Vitamin (MULTIVITAMIN WITH MINERALS) TABS tablet Take 1 tablet by mouth daily.    Marland Kitchen ALPRAZolam (XANAX) 0.5 MG tablet Take 3 tablets (1.5 mg total) by mouth at bedtime.    . calcium-vitamin D (OSCAL WITH D) 500-200 MG-UNIT tablet Take 1 tablet by mouth daily.    . clindamycin (CLEOCIN) 150 MG capsule Take 450 mg by mouth See admin instructions. Take before dental procedures    . Multiple Vitamins-Minerals (MULTIVITAMIN WITH MINERALS) tablet Take 1 tablet by mouth 2 (two) times daily.     . nitroGLYCERIN (NITROSTAT) 0.4 MG SL tablet Place 1 tablet (0.4 mg total) under the tongue every 5 (five) minutes as needed for chest pain. Go to ER if third tablet is necessary 30 tablet 5  . omeprazole (PRILOSEC) 20 MG capsule Take 1 capsule (20 mg total) by mouth daily. 90 capsule 4  . ondansetron (ZOFRAN) 4 MG tablet Take 1 tablet (4 mg total) by mouth every 8 (eight) hours as needed for nausea or vomiting. (Patient not taking: Reported on 12/31/2019) 20 tablet 0  . pramipexole (MIRAPEX) 1 MG tablet TAKE 1 TABLET BY MOUTH AT BEDTIME 30 tablet 12  . prochlorperazine (COMPAZINE) 10 MG tablet Take 1 tablet (10 mg total) by mouth every 6 (six) hours as needed for nausea or vomiting. (Patient not taking: Reported on 02/25/2020) 30 tablet 0  . sulfamethoxazole-trimethoprim (BACTRIM DS) 800-160 MG tablet Take 1 tablet by mouth 2 (two) times daily. 14 tablet 0  . tamoxifen (NOLVADEX) 20 MG tablet Take 1 tablet (20 mg total) by mouth daily. 90 tablet 3  . valACYclovir (VALTREX) 500 MG tablet  Take 1 tablet (500 mg total) by mouth daily as needed (Cold sores).     No current facility-administered medications for this encounter.    Physical Findings: The patient is in no acute distress. Patient is alert and oriented.  height is 5\' 2"  (1.575 m) and weight is 183 lb 3.2 oz (83.1 kg). Her temperature is 97.4 F (36.3 C) (abnormal).  Her blood pressure is 110/54 (abnormal) and her pulse is 88. Her respiration is 20 and oxygen saturation is 97%.   N Lungs are clear to auscultation bilaterally. Heart has regular rate and rhythm. No palpable cervical, supraclavicular, or axillary adenopathy. Abdomen soft, non-tender, normal bowel sounds.   Lab Findings: Lab Results  Component Value Date   WBC 5.0 03/03/2020   HGB 12.0 03/03/2020   HCT 37.3 03/03/2020   MCV 96.9 03/03/2020   PLT 222 03/03/2020    Radiographic Findings: CT Chest W Contrast  Result Date: 02/23/2020 CLINICAL DATA:  Primary Cancer Type: Lung Imaging Indication: Assess response to therapy. RLL nodule seen on PET-CT for breast cancer. Interval therapy since last imaging? Yes Initial Cancer Diagnosis Date: 12/22/2019  Established by: Biopsy-proven Detailed Pathology: Stage IIIa non-small cell lung cancer, adenocarcinoma Primary Tumor location: Right lower lobe Chemotherapy: Yes; Ongoing? Yes; Most recent administration: 02/09/2020 Radiation therapy? Yes; Date Range: 01/14/2020-01/19/2020; Target: Right lung Other Cancers: Stage I right breast adenocarcinoma status post right lumpectomy followed by adjuvant radiotherapy and currently on treatment with hormonal therapy with Femara. EXAM: CT CHEST WITH CONTRAST TECHNIQUE: Multidetector CT imaging of the chest was performed during intravenous contrast administration. CONTRAST:  16mL OMNIPAQUE IOHEXOL 300 MG/ML  SOLN COMPARISON:  PET-CT 12/07/2019. FINDINGS: Cardiovascular: Normal heart size. No significant pericardial effusion/thickening. Atherosclerotic nonaneurysmal thoracic aorta. Normal caliber pulmonary arteries. No central pulmonary emboli. Mediastinum/Nodes: No discrete thyroid nodules. Unremarkable esophagus. Surgical clips in the right axilla. No axillary adenopathy. Previously enlarged right paratracheal lymph nodes have decreased, for example measuring 0.9 cm (series 2/image 40), previously 1.7 cm on 12/07/2019  PET-CT. However there is new ill-defined right paratracheal/right tracheal wall soft tissue with eccentric right tracheal wall thickening up to 1.2 cm thickness (series 2/image 33). Mildly enlarged 1.0 cm subcarinal node (series 2/image 59), previously 1.5 cm, decreased. No pathologically enlarged hilar lymph nodes. Lungs/Pleura: No pneumothorax. No pleural effusion. Irregular 1.2 x 0.9 cm medial basilar right lower lobe pulmonary nodule (series 7/image 100), previously 1.4 x 1.1 cm on 12/07/2019 PET-CT, slightly decreased. Clustered small nodular opacities in the medial basilar left lower lobe are not substantially changed and are favored to represent mucoid impaction within dilated peripheral airways. No new significant pulmonary nodules. New mild sharply marginated patchy bandlike subpleural consolidation and ground-glass opacity in the anterior right mid lung (series 7/image 71), compatible with evolving radiation change. New mild patchy ground-glass opacity in the posterior right upper lobe is also probably treatment related. Upper abdomen: Small hiatal hernia.  Simple 2.0 cm left liver cyst. Musculoskeletal: No aggressive appearing focal osseous lesions. Moderate dextrocurvature of the thoracic spine with moderate thoracic spondylosis. IMPRESSION: 1. Irregular medial basilar right lower lobe pulmonary nodule is slightly decreased. 2. Previously visualized enlarged right paratracheal and subcarinal lymph nodes on 12/07/2019 PET-CT study are decreased. 3. New ill-defined right paratracheal/right tracheal wall soft tissue. Findings are nonspecific and could be inflammatory due to post treatment change or new metastatic disease. Recommend attention on short-term follow-up chest CT with IV contrast in 3 months. 4. Evolving radiation change in the anterior right mid lung. 5. Small  hiatal hernia. 6. Aortic Atherosclerosis (ICD10-I70.0). Electronically Signed   By: Ilona Sorrel M.D.   On: 02/23/2020 11:01     Impression: Stage IIIa (T1b, N2, M0) non-small cell lung cancer, adenocarcinoma presented with right lower lobe lung nodule in addition to right hilar and mediastinal lymphadenopathy diagnosed in March of 2021  The patient is recovering from the effects of radiation.  She had mild esophageal symptoms which have resolved.  Patient's radiation therapy for her lung cancer was limited in light of her previous right breast radiation treatment and the length of her radiation field.  She received approximately half of her initially  planned dose of radiation therapy but as above did achieve tumor shrinkage on recent scans  Plan: The patient is scheduled to see Dr. Julien Nordmann on 03/31/2020.  As needed follow-up in radiation oncology.  The patient will continue on immunotherapy as above.  ____________________________________   Blair Promise, PhD, MD  This document serves as a record of services personally performed by Gery Pray, MD. It was created on his behalf by Clerance Lav, a trained medical scribe. The creation of this record is based on the scribe's personal observations and the provider's statements to them. This document has been checked and approved by the attending provider.

## 2020-03-10 ENCOUNTER — Encounter: Payer: Self-pay | Admitting: Radiation Oncology

## 2020-03-10 ENCOUNTER — Other Ambulatory Visit: Payer: Self-pay

## 2020-03-10 ENCOUNTER — Ambulatory Visit
Admission: RE | Admit: 2020-03-10 | Discharge: 2020-03-10 | Disposition: A | Discharge status: Home / Self Care | Payer: Federal, State, Local not specified - PPO | Source: Ambulatory Visit | Attending: Radiation Oncology | Admitting: Radiation Oncology

## 2020-03-10 DIAGNOSIS — K7689 Other specified diseases of liver: Secondary | ICD-10-CM | POA: Insufficient documentation

## 2020-03-10 DIAGNOSIS — R0602 Shortness of breath: Secondary | ICD-10-CM | POA: Insufficient documentation

## 2020-03-10 DIAGNOSIS — R911 Solitary pulmonary nodule: Secondary | ICD-10-CM | POA: Insufficient documentation

## 2020-03-10 DIAGNOSIS — C3431 Malignant neoplasm of lower lobe, right bronchus or lung: Secondary | ICD-10-CM | POA: Insufficient documentation

## 2020-03-10 DIAGNOSIS — C3491 Malignant neoplasm of unspecified part of right bronchus or lung: Secondary | ICD-10-CM

## 2020-03-10 DIAGNOSIS — Z79899 Other long term (current) drug therapy: Secondary | ICD-10-CM | POA: Diagnosis not present

## 2020-03-10 DIAGNOSIS — K449 Diaphragmatic hernia without obstruction or gangrene: Secondary | ICD-10-CM | POA: Insufficient documentation

## 2020-03-10 DIAGNOSIS — R5383 Other fatigue: Secondary | ICD-10-CM | POA: Diagnosis not present

## 2020-03-10 NOTE — Progress Notes (Signed)
Patient there for a 1 month f/u visit.  Pain : no  Fatigue: mild  No cough  Shortness of breath with increased exertion  No swallowing issues  No longer taking Carafate  Skin:  No issues  Had first Immunotherapy tx last week.  Reports problems sleeping with 3- 5 hours of interrupted sleep   Wants to discuss CT scan  BP (!) 110/54 (BP Location: Left Arm, Patient Position: Sitting, Cuff Size: Normal)   Pulse 88   Temp (!) 97.4 F (36.3 C)   Resp 20   Ht 5\' 2"  (1.575 m)   Wt 183 lb 3.2 oz (83.1 kg)   SpO2 97%   BMI 33.51 kg/m    Wt Readings from Last 3 Encounters:  03/10/20 183 lb 3.2 oz (83.1 kg)  03/03/20 180 lb 12 oz (82 kg)  02/25/20 180 lb 8 oz (81.9 kg)

## 2020-03-28 ENCOUNTER — Ambulatory Visit (INDEPENDENT_AMBULATORY_CARE_PROVIDER_SITE_OTHER): Payer: Federal, State, Local not specified - PPO | Admitting: Family Medicine

## 2020-03-28 ENCOUNTER — Other Ambulatory Visit: Payer: Self-pay

## 2020-03-28 ENCOUNTER — Encounter: Payer: Self-pay | Admitting: Family Medicine

## 2020-03-28 VITALS — BP 110/60 | HR 85 | Temp 97.1°F | Resp 18 | Wt 178.0 lb

## 2020-03-28 DIAGNOSIS — R350 Frequency of micturition: Secondary | ICD-10-CM | POA: Diagnosis not present

## 2020-03-28 DIAGNOSIS — N390 Urinary tract infection, site not specified: Secondary | ICD-10-CM

## 2020-03-28 LAB — POCT URINALYSIS DIPSTICK
Bilirubin, UA: NEGATIVE
Blood, UA: NEGATIVE
Glucose, UA: NEGATIVE
Ketones, UA: NEGATIVE
Leukocytes, UA: NEGATIVE
Nitrite, UA: NEGATIVE
Protein, UA: NEGATIVE
Spec Grav, UA: 1.01 (ref 1.010–1.025)
Urobilinogen, UA: 0.2 E.U./dL
pH, UA: 5 (ref 5.0–8.0)

## 2020-03-28 NOTE — Progress Notes (Signed)
Established patient visit   Patient: Elizabeth [name]   DOB: 10-Apr-1951   69 y.o. Female  MRN: [id number] Visit Date: 03/28/2020  Today's healthcare provider: Lelon Huh, MD   Chief Complaint  Patient presents with  . Urinary Frequency    x 2-3 days    Subjective    Urinary Frequency  This is a new problem. Episode onset: 2-3 days ago. The problem has been gradually worsening. The patient is experiencing no pain. There has been no fever. Associated symptoms include frequency. Pertinent negatives include no chills, discharge, flank pain, hematuria, hesitancy, nausea, possible pregnancy, sweats, urgency or vomiting. She has tried nothing for the symptoms.     Medications: Outpatient Medications Prior to Visit  Medication Sig  . ALPRAZolam (XANAX) 0.5 MG tablet Take 3 tablets (1.5 mg total) by mouth at bedtime.  . calcium-vitamin D (OSCAL WITH D) 500-200 MG-UNIT tablet Take 1 tablet by mouth daily.  . clindamycin (CLEOCIN) 150 MG capsule Take 450 mg by mouth See admin instructions. Take before dental procedures  . Multiple Vitamin (MULTIVITAMIN WITH MINERALS) TABS tablet Take 1 tablet by mouth daily.  . Multiple Vitamins-Minerals (MULTIVITAMIN WITH MINERALS) tablet Take 1 tablet by mouth 2 (two) times daily.   . nitroGLYCERIN (NITROSTAT) 0.4 MG SL tablet Place 1 tablet (0.4 mg total) under the tongue every 5 (five) minutes as needed for chest pain. Go to ER if third tablet is necessary  . omeprazole (PRILOSEC) 20 MG capsule Take 1 capsule (20 mg total) by mouth daily.  . ondansetron (ZOFRAN) 4 MG tablet Take 1 tablet (4 mg total) by mouth every 8 (eight) hours as needed for nausea or vomiting.  . pramipexole (MIRAPEX) 1 MG tablet TAKE 1 TABLET BY MOUTH AT BEDTIME  . prochlorperazine (COMPAZINE) 10 MG tablet Take 1 tablet (10 mg total) by mouth every 6 (six) hours as needed for nausea or vomiting.  . tamoxifen (NOLVADEX) 20 MG tablet Take 1 tablet (20 mg total) by  mouth daily.  . valACYclovir (VALTREX) 500 MG tablet Take 1 tablet (500 mg total) by mouth daily as needed (Cold sores).  . [DISCONTINUED] sulfamethoxazole-trimethoprim (BACTRIM DS) 800-160 MG tablet Take 1 tablet by mouth 2 (two) times daily. (Patient not taking: Reported on 03/28/2020)   No facility-administered medications prior to visit.    Review of Systems  Constitutional: Negative.  Negative for chills.  Respiratory: Negative.   Cardiovascular: Negative.   Gastrointestinal: Negative for nausea and vomiting.  Genitourinary: Positive for frequency. Negative for flank pain, hematuria, hesitancy and urgency.  Musculoskeletal: Negative.      Objective    BP 110/60 (BP Location: Left Arm, Patient Position: Sitting, Cuff Size: Normal)   Pulse 85   Temp (!) 97.1 F (36.2 C) (Temporal)   Resp 18   Wt 178 lb (80.7 kg)   SpO2 97% Comment: room air  BMI 32.56 kg/m   Physical Exam  General appearance: Overweight female, cooperative and in no acute distress Head: Normocephalic, without obvious abnormality, atraumatic Respiratory: Respirations even and unlabored, normal respiratory rate Extremities: All extremities are intact.  Skin: Skin color, texture, turgor normal. No rashes seen  Psych: Appropriate mood and affect. Neurologic: Mental status: Alert, oriented to person, place, and time, thought content appropriate.   Results for orders placed or performed in visit on 03/28/20  POCT Urinalysis Dipstick  Result Value Ref Range   Color, UA yellow    Clarity, UA clear    Glucose, UA Negative Negative  Bilirubin, UA negative    Ketones, UA negative    Spec Grav, UA 1.010 1.010 - 1.025   Blood, UA negative    pH, UA 5.0 5.0 - 8.0   Protein, UA Negative Negative   Urobilinogen, UA 0.2 0.2 or 1.0 E.U./dL   Nitrite, UA negative    Leukocytes, UA Negative Negative   Appearance     Odor      Assessment & Plan     1. Urinary frequency  - CULTURE, URINE COMPREHENSIVE  2.  Urinary tract infection without hematuria, site unspecified (suspected)  - CULTURE, URINE COMPREHENSIVE   No follow-ups on file.      The entirety of the information documented in the History of Present Illness, Review of Systems and Physical Exam were personally obtained by me. Portions of this information were initially documented by the CMA and reviewed by me for thoroughness and accuracy.      Lelon Huh, MD  [facility] [phone] (phone) [phone] (fax)  [place]

## 2020-03-31 ENCOUNTER — Inpatient Hospital Stay: Payer: Federal, State, Local not specified - PPO

## 2020-03-31 ENCOUNTER — Inpatient Hospital Stay: Payer: Federal, State, Local not specified - PPO | Attending: Internal Medicine

## 2020-03-31 ENCOUNTER — Telehealth: Payer: Self-pay

## 2020-03-31 ENCOUNTER — Other Ambulatory Visit: Payer: Self-pay

## 2020-03-31 ENCOUNTER — Inpatient Hospital Stay: Payer: Federal, State, Local not specified - PPO | Admitting: Internal Medicine

## 2020-03-31 ENCOUNTER — Encounter: Payer: Self-pay | Admitting: Internal Medicine

## 2020-03-31 VITALS — BP 130/66 | HR 84 | Temp 97.9°F | Resp 18 | Ht 62.0 in | Wt 175.1 lb

## 2020-03-31 DIAGNOSIS — G4730 Sleep apnea, unspecified: Secondary | ICD-10-CM | POA: Diagnosis not present

## 2020-03-31 DIAGNOSIS — C3491 Malignant neoplasm of unspecified part of right bronchus or lung: Secondary | ICD-10-CM | POA: Diagnosis not present

## 2020-03-31 DIAGNOSIS — Z79811 Long term (current) use of aromatase inhibitors: Secondary | ICD-10-CM | POA: Insufficient documentation

## 2020-03-31 DIAGNOSIS — Z79899 Other long term (current) drug therapy: Secondary | ICD-10-CM | POA: Insufficient documentation

## 2020-03-31 DIAGNOSIS — Z170 Estrogen receptor positive status [ER+]: Secondary | ICD-10-CM | POA: Insufficient documentation

## 2020-03-31 DIAGNOSIS — Z923 Personal history of irradiation: Secondary | ICD-10-CM | POA: Insufficient documentation

## 2020-03-31 DIAGNOSIS — C3431 Malignant neoplasm of lower lobe, right bronchus or lung: Secondary | ICD-10-CM | POA: Insufficient documentation

## 2020-03-31 DIAGNOSIS — R0602 Shortness of breath: Secondary | ICD-10-CM | POA: Diagnosis not present

## 2020-03-31 DIAGNOSIS — Z5112 Encounter for antineoplastic immunotherapy: Secondary | ICD-10-CM

## 2020-03-31 DIAGNOSIS — C50411 Malignant neoplasm of upper-outer quadrant of right female breast: Secondary | ICD-10-CM | POA: Insufficient documentation

## 2020-03-31 LAB — CBC WITH DIFFERENTIAL (CANCER CENTER ONLY)
Abs Immature Granulocytes: 0.02 10*3/uL (ref 0.00–0.07)
Basophils Absolute: 0 10*3/uL (ref 0.0–0.1)
Basophils Relative: 0 %
Eosinophils Absolute: 0.2 10*3/uL (ref 0.0–0.5)
Eosinophils Relative: 3 %
HCT: 38.2 % (ref 36.0–46.0)
Hemoglobin: 12.6 g/dL (ref 12.0–15.0)
Immature Granulocytes: 0 %
Lymphocytes Relative: 18 %
Lymphs Abs: 1.1 10*3/uL (ref 0.7–4.0)
MCH: 31 pg (ref 26.0–34.0)
MCHC: 33 g/dL (ref 30.0–36.0)
MCV: 93.9 fL (ref 80.0–100.0)
Monocytes Absolute: 0.5 10*3/uL (ref 0.1–1.0)
Monocytes Relative: 8 %
Neutro Abs: 4.5 10*3/uL (ref 1.7–7.7)
Neutrophils Relative %: 71 %
Platelet Count: 264 10*3/uL (ref 150–400)
RBC: 4.07 MIL/uL (ref 3.87–5.11)
RDW: 14.1 % (ref 11.5–15.5)
WBC Count: 6.3 10*3/uL (ref 4.0–10.5)
nRBC: 0 % (ref 0.0–0.2)

## 2020-03-31 LAB — CMP (CANCER CENTER ONLY)
ALT: 33 U/L (ref 0–44)
AST: 22 U/L (ref 15–41)
Albumin: 3.7 g/dL (ref 3.5–5.0)
Alkaline Phosphatase: 49 U/L (ref 38–126)
Anion gap: 11 (ref 5–15)
BUN: 11 mg/dL (ref 8–23)
CO2: 20 mmol/L — ABNORMAL LOW (ref 22–32)
Calcium: 9 mg/dL (ref 8.9–10.3)
Chloride: 108 mmol/L (ref 98–111)
Creatinine: 0.7 mg/dL (ref 0.44–1.00)
GFR, Est AFR Am: >60 mL/min (ref >60)
GFR, Estimated: >60 mL/min (ref >60)
Glucose, Bld: 94 mg/dL (ref 70–99)
Potassium: 4.3 mmol/L (ref 3.5–5.1)
Sodium: 139 mmol/L (ref 135–145)
Total Bilirubin: 0.4 mg/dL (ref 0.3–1.2)
Total Protein: 7.3 g/dL (ref 6.5–8.1)

## 2020-03-31 LAB — TSH: TSH: 1.353 u[IU]/mL (ref 0.308–3.960)

## 2020-03-31 LAB — CULTURE, URINE COMPREHENSIVE

## 2020-03-31 MED ORDER — SODIUM CHLORIDE 0.9 % IV SOLN
1500.0000 mg | Freq: Once | INTRAVENOUS | Status: AC
  Administered 2020-03-31: 1500 mg via INTRAVENOUS
  Filled 2020-03-31: qty 30

## 2020-03-31 MED ORDER — SODIUM CHLORIDE 0.9 % IV SOLN
Freq: Once | INTRAVENOUS | Status: AC
  Filled 2020-03-31: qty 250

## 2020-03-31 NOTE — Progress Notes (Signed)
[place] [phone]   [phone]  OFFICE PROGRESS NOTE  Birdie Sons, MD [address] [place] [address]  DIAGNOSIS:  1) Stage IIIa (T1b, N2, M0) non-small cell lung cancer, adenocarcinoma presented with right lower lobe lung nodule in addition to right hilar and mediastinal lymphadenopathy diagnosed in March 2021. 2) history of a stage I (T1c, N0, M0 right breast adenocarcinoma with positive ER/PR status post right lumpectomy followed by adjuvant radiotherapy and currently on treatment with hormonal therapy with Femara.  Molecular studies by foundation 1: BRAF mutation V600E  PDL1 expression 100%.  PRIOR THERAPY:  1) Concurrent chemoradiation with weekly carboplatin for AUC of 2 and paclitaxel 45 NG/M2. Status post5cycles.Completed radiation 01/2020. Completed chemotherapy on 02/09/20  CURRENT THERAPY: Consolidation immunotherapy with Imfinzi 1500 mg IV every 4 weeks.  First dose on 03/03/2020.  Status post 1 cycle.  INTERVAL HISTORY: Elizabeth [name] 69 y.o. female returns to the clinic today for follow-up visit accompanied by her husband.  The patient is feeling fine today with no concerning complaints except for shortness of breath with exertion and questionable sleep apnea.  She denied having any chest pain, cough or hemoptysis.  She denied having any fever or chills.  She has no nausea, vomiting, diarrhea or constipation.  She has no headache or visual changes.  She tolerated the first cycle of her treatment with Imfinzi fairly well.  The patient is here today for evaluation before starting cycle #2.  MEDICAL HISTORY: Past Medical History:  Diagnosis Date  . Anginal pain ([place])   . Anxiety   . Cancer [facility])    breast cancer (radical lumpectomy and radiation  . Complication of anesthesia    one time woke when she had a tube down throat and had a panic attack  . Constipation due to opioid therapy   .  Degenerative joint disease (DJD) of hip   . GERD (gastroesophageal reflux disease)   . Headache    migraines when she was working  . Osteopenia   . Pneumonia   . Restless leg syndrome    takes Mirapex and xanax  . Scoliosis   . SUI (stress urinary incontinence, female)   . Vaso-vagal reaction    after back surgery    ALLERGIES:  is allergic to zostavax [zoster vaccine live], naproxen, penicillins, zoloft [sertraline hcl], and latex.  MEDICATIONS:  Current Outpatient Medications  Medication Sig Dispense Refill  . ALPRAZolam (XANAX) 0.5 MG tablet Take 3 tablets (1.5 mg total) by mouth at bedtime.    . calcium-vitamin D (OSCAL WITH D) 500-200 MG-UNIT tablet Take 1 tablet by mouth daily.    . clindamycin (CLEOCIN) 150 MG capsule Take 450 mg by mouth See admin instructions. Take before dental procedures    . Multiple Vitamin (MULTIVITAMIN WITH MINERALS) TABS tablet Take 1 tablet by mouth daily.    . Multiple Vitamins-Minerals (MULTIVITAMIN WITH MINERALS) tablet Take 1 tablet by mouth 2 (two) times daily.     . nitroGLYCERIN (NITROSTAT) 0.4 MG SL tablet Place 1 tablet (0.4 mg total) under the tongue every 5 (five) minutes as needed for chest pain. Go to ER if third tablet is necessary 30 tablet 5  . omeprazole (PRILOSEC) 20 MG capsule Take 1 capsule (20 mg total) by mouth daily. 90 capsule 4  . ondansetron (ZOFRAN) 4 MG tablet Take 1 tablet (4 mg total) by mouth every 8 (eight) hours as needed for nausea or vomiting. Parachute  tablet 0  . pramipexole (MIRAPEX) 1 MG tablet TAKE 1 TABLET BY MOUTH AT BEDTIME 30 tablet 12  . prochlorperazine (COMPAZINE) 10 MG tablet Take 1 tablet (10 mg total) by mouth every 6 (six) hours as needed for nausea or vomiting. 30 tablet 0  . tamoxifen (NOLVADEX) 20 MG tablet Take 1 tablet (20 mg total) by mouth daily. 90 tablet 3  . valACYclovir (VALTREX) 500 MG tablet Take 1 tablet (500 mg total) by mouth daily as needed (Cold sores).     No current facility-administered  medications for this visit.    SURGICAL HISTORY:  Past Surgical History:  Procedure Laterality Date  . APPENDECTOMY  1963  . BACK SURGERY    . BREAST BIOPSY Right 04/27/2019   Affirm Bx "X" clip-INVASIVE MAMMARY CARCINOMA,  . BREAST CYST ASPIRATION Right 1988  . BREAST EXCISIONAL BIOPSY Right 1990   benign x 3  . BREAST LUMPECTOMY Right 05/08/2019  . BREAST LUMPECTOMY WITH SENTINEL LYMPH NODE BIOPSY Right 05/08/2019   Procedure: RIGHT BREAST LUMPECTOMY WITH SENTINEL LYMPH NODE BX AND WIRE LOC., LATEX ALLERGY;  Surgeon: Jules Husbands, MD;  Location: [facility];  Service: General;  Laterality: Right;  . BREAST SURGERY    . CATARACT EXTRACTION W/PHACO Right 03/31/2018   Procedure: CATARACT EXTRACTION PHACO AND INTRAOCULAR LENS PLACEMENT ([place]) RIGHT;  Surgeon: Leandrew Koyanagi, MD;  Location: [place];  Service: Ophthalmology;  Laterality: Right;  Latex sensitivity  . CATARACT EXTRACTION W/PHACO Left 04/16/2018   Procedure: CATARACT EXTRACTION PHACO AND INTRAOCULAR LENS PLACEMENT ([place])  LEFT;  Surgeon: Leandrew Koyanagi, MD;  Location: [place];  Service: Ophthalmology;  Laterality: Left;  . EYE SURGERY     Lasik  . FINE NEEDLE ASPIRATION  12/22/2019   Procedure: FINE NEEDLE ASPIRATION (FNA) LINEAR;  Surgeon: Collene Gobble, MD;  Location: [facility];  Service: Pulmonary;;  . FOOT SURGERY Bilateral   . HYSTEROTOMY    . LAMINECTOMY  2006  . ROTATOR CUFF REPAIR Bilateral   . TONSILLECTOMY     age 69  . TOTAL HIP ARTHROPLASTY Right 01/18/2015   Procedure: RIGHT TOTAL HIP ARTHROPLASTY ANTERIOR APPROACH;  Surgeon: Renette Butters, MD;  Location: [place];  Service: Orthopedics;  Laterality: Right;  . TUBAL LIGATION    . VIDEO BRONCHOSCOPY WITH ENDOBRONCHIAL ULTRASOUND N/A 12/22/2019   Procedure: VIDEO BRONCHOSCOPY WITH ENDOBRONCHIAL ULTRASOUND;  Surgeon: Collene Gobble, MD;  [facility];  Service: Pulmonary;  Laterality: N/A;    REVIEW OF SYSTEMS:  A  comprehensive review of systems was negative except for: Respiratory: positive for dyspnea on exertion   PHYSICAL EXAMINATION: General appearance: alert, cooperative and no distress Head: Normocephalic, without obvious abnormality, atraumatic Neck: no adenopathy, no JVD, supple, symmetrical, trachea midline and thyroid not enlarged, symmetric, no tenderness/mass/nodules Lymph nodes: Cervical, supraclavicular, and axillary nodes normal. Resp: clear to auscultation bilaterally Back: symmetric, no curvature. ROM normal. No CVA tenderness. Cardio: regular rate and rhythm, S1, S2 normal, no murmur, click, rub or gallop GI: soft, non-tender; bowel sounds normal; no masses,  no organomegaly Extremities: extremities normal, atraumatic, no cyanosis or edema  ECOG PERFORMANCE STATUS: 1 - Symptomatic but completely ambulatory  Blood pressure 130/66, pulse 84, temperature 97.9 F (36.6 C), temperature source Temporal, resp. rate 18, height _0  (1.575 m), weight 175 lb 1.6 oz (79.4 kg), SpO2 98 %.  LABORATORY DATA: Lab Results  Component Value Date   WBC 6.3 03/31/2020   HGB 12.6 03/31/2020   HCT 38.2  03/31/2020   MCV 93.9 03/31/2020   PLT 264 03/31/2020      Chemistry      Component Value Date/Time   NA 141 03/03/2020 1313   NA 140 02/25/2019 0955   K 4.0 03/03/2020 1313   CL 105 03/03/2020 1313   CO2 24 03/03/2020 1313   BUN 20 03/03/2020 1313   BUN 16 02/25/2019 0955   CREATININE 0.75 03/03/2020 1313   CREATININE 0.70 09/10/2017 0848   GLU 91 09/28/2014 0000      Component Value Date/Time   CALCIUM 9.1 03/03/2020 1313   ALKPHOS 57 03/03/2020 1313   AST 28 03/03/2020 1313   ALT 32 03/03/2020 1313   BILITOT 0.6 03/03/2020 1313       RADIOGRAPHIC STUDIES: No results found.  ASSESSMENT AND PLAN: This is a very pleasant 69 years old white female with a stage IIIa non-small cell lung cancer, adenocarcinoma in March 2021 and presented with right lower lobe lung nodule in  addition to right hilar and mediastinal lymphadenopathy.  Molecular studies showed positive for BRAF mutation V600E and PD-L1 expression was 100%. The patient also has a history of stage I right breast carcinoma and currently on treatment with Femara. She completed a course of concurrent chemoradiation with weekly carboplatin and paclitaxel for 5 weeks.  Her radiotherapy was discontinued earlier because of the previous radiation to the right breast. The patient is currently undergoing consolidation treatment with immunotherapy with Imfinzi status post 1 cycle.  She tolerated the first cycle of her treatment well with no concerning adverse effects. I recommended for the patient to proceed with cycle #2 today as planned. I will see her back for follow-up visit in 4 weeks for evaluation before starting cycle #3. For the shortness of breath and sleep apnea, I will refer the patient to pulmonary medicine for evaluation. The patient was advised to call immediately if she has any concerning symptoms in the interval. The patient voices understanding of current disease status and treatment options and is in agreement with the current care plan.  All questions were answered. The patient knows to call the clinic with any problems, questions or concerns. We can certainly see the patient much sooner if necessary.  Disclaimer: This note was dictated with voice recognition software. Similar sounding words can inadvertently be transcribed and may not be corrected upon review.

## 2020-03-31 NOTE — Telephone Encounter (Signed)
Copied from [place] [id number]. Topic: General - Other >> Mar 31, 2020  9:54 AM Leward Quan A wrote: Reason for CRM: Patient called to inform Dr Caryn Section that she saw her urine culture result and wanted to know if Dr was going to be sending an Rx in for her. Please call Ph# [phone]

## 2020-03-31 NOTE — Telephone Encounter (Signed)
The culture just grew normal uro-genital bacteria in normal numbers. An infection would be indicated only if there were more than 50,000 colonies from pathogenic bacteria.

## 2020-03-31 NOTE — Telephone Encounter (Signed)
Patient advised.

## 2020-03-31 NOTE — Patient Instructions (Signed)
[facility] Discharge Instructions for Patients Receiving Chemotherapy  Today you received the following chemotherapy agents: durvalumab.  To help prevent nausea and vomiting after your treatment, we encourage you to take your nausea medication as directed.   If you develop nausea and vomiting that is not controlled by your nausea medication, call the clinic.   BELOW ARE SYMPTOMS THAT SHOULD BE REPORTED IMMEDIATELY:  *FEVER GREATER THAN 100.5 F  *CHILLS WITH OR WITHOUT FEVER  NAUSEA AND VOMITING THAT IS NOT CONTROLLED WITH YOUR NAUSEA MEDICATION  *UNUSUAL SHORTNESS OF BREATH  *UNUSUAL BRUISING OR BLEEDING  TENDERNESS IN MOUTH AND THROAT WITH OR WITHOUT PRESENCE OF ULCERS  *URINARY PROBLEMS  *BOWEL PROBLEMS  UNUSUAL RASH Items with * indicate a potential emergency and should be followed up as soon as possible.  Feel free to call the clinic should you have any questions or concerns. The clinic phone number is [phone].  Please show the CHEMO ALERT CARD at check-in to the Emergency Department and triage nurse.   

## 2020-04-06 ENCOUNTER — Telehealth: Payer: Self-pay | Admitting: Internal Medicine

## 2020-04-06 NOTE — Telephone Encounter (Signed)
Scheduled per los. Called and spoke with patient. Confirmed appt 

## 2020-04-18 ENCOUNTER — Ambulatory Visit (INDEPENDENT_AMBULATORY_CARE_PROVIDER_SITE_OTHER): Payer: Federal, State, Local not specified - PPO | Admitting: Internal Medicine

## 2020-04-18 ENCOUNTER — Other Ambulatory Visit: Payer: Self-pay

## 2020-04-18 ENCOUNTER — Encounter: Payer: Self-pay | Admitting: Internal Medicine

## 2020-04-18 ENCOUNTER — Ambulatory Visit (INDEPENDENT_AMBULATORY_CARE_PROVIDER_SITE_OTHER)
Admission: RE | Admit: 2020-04-18 | Discharge: 2020-04-18 | Disposition: A | Discharge status: Home / Self Care | Payer: Federal, State, Local not specified - PPO | Source: Ambulatory Visit | Attending: Internal Medicine | Admitting: Internal Medicine

## 2020-04-18 DIAGNOSIS — R0609 Other forms of dyspnea: Secondary | ICD-10-CM

## 2020-04-18 DIAGNOSIS — R0600 Dyspnea, unspecified: Secondary | ICD-10-CM | POA: Diagnosis not present

## 2020-04-18 MED ORDER — FAMOTIDINE 20 MG PO TABS: ORAL_TABLET | ORAL | 11 refills | Status: AC

## 2020-04-18 MED ORDER — PANTOPRAZOLE SODIUM 40 MG PO TBEC: 40.0000 mg | DELAYED_RELEASE_TABLET | Freq: Every day | ORAL | 2 refills | Status: AC

## 2020-04-18 NOTE — Progress Notes (Signed)
Elizabeth [name], female    DOB: 09/09/1951,   MRN: [id number]   Brief patient profile:  55 yowf from [facility] quit smoking 2013 with no symptoms then  R breast ca dx July 2020 = T: 1cN: 0 Group: Stage 1 A invasive Mammory Carcinoma of > radical lumpectomy RT  And Tamoxifen   and then marked elevation of markers so scans done [place] >  PET done for the first time and showed Pos adenopathy and RLL nodule which was SUV 2.3  so referred to pulmonary clinic for consideration for EBUS.    From oncology notes: Oncotype DX testing revealed a score of 10, with a 3% chance of distant recurrence in 9 years with adjuvant endocrine therapy alone.  Absolute chemotherapy benefit was less than 1% but tumor markers have increased since surgery thus PET scan ordered.     History of Present Illness  12/15/2019  Pulmonary/ 1st office eval/Elizabeth [name]  Chief Complaint  Patient presents with  . Pulmonary Consult    Referred by Elizabeth [name] for eval of abnormal CT Chest- mediastinal adenopathy and pulmonary nodule.   Dyspnea:  Not limited by breathing from desired activities   Cough: none Sleep: able to lie flat fine  SABA use:  5-6 weeks slipped in bath tub striking R chest wall /rib fx on PET with still residual pain but improving  rec R breast ca dx July 2020 = T: 1cN: 0 Group: Stage 1 A invasive Mammory Carcinoma of > radical lumpectomy RT  And Tamoxifen   - tumor markers trending up 11/2019 -PET 12/07/19  1. Hypermetabolic paratracheal, right hilar, and subcarinal adenopathy, suspicious for active malignancy    01/29/20 Completed RT and feeling fine 02/02/20  Last chemo  W/in a week of last RT started to note doe  Steps/ yard work  (avoiding steps now) but still able to do shopping leaning on cart.    Last Oncology note 03/31/20 DIAGNOSIS:  1) Stage IIIa (T1b, N2, M0) non-small cell lung cancer, adenocarcinoma presented with right lower lobe lung nodule in addition to right hilar and  mediastinal lymphadenopathy diagnosed in March 2021. 2) history of a stage I (T1c, N0, M0 right breast adenocarcinoma with positive ER/PR status post right lumpectomy followed by adjuvant radiotherapy and currently on treatment with hormonal therapy with Femara.  Molecular studies by foundation 1: BRAF mutation V600E  PDL1 expression 100%.  PRIOR THERAPY: 1)Concurrent chemoradiation with weekly carboplatin for AUC of 2 and paclitaxel 45 NG/M2. Status post5cycles.Completed radiation 01/2020. Completed chemotherapy on 02/09/20  CURRENT THERAPY:Consolidation immunotherapy with Imfinzi 1500 mg IV every 4 weeks. First dose on 03/03/2020.  Status post 1 cycle.  INTERVAL HISTORY: Elizabeth [name] 69 y.o. female returns to the clinic today for follow-up visit accompanied by her husband.  The patient is feeling fine today with no concerning complaints except for shortness of breath with exertion and questionable sleep apnea.  She denied having any chest pain, cough or hemoptysis.  She denied having any fever or chills.  She has no nausea, vomiting, diarrhea or constipation.  She has no headache or visual changes.  She tolerated the first cycle of her treatment with Imfinzi fairly well    04/18/2020  f/u ov/Elizabeth [name] re: doe / s/p second pfizer vaccine 12/08/19  Chief Complaint  Patient presents with  . Follow-up    Has been following with Elizabeth [name]. She c/o nasal congestion and feeling of congestion in her throat today. She states her spouse has noticed labored breathing at  night.   Dyspnea:  Avoiding  steps / MMRC1 = can walk nl pace, flat grade, can't hurry or go uphills or steps s sob   Cough: minimal assoc nasal drainge  Sleeping: on side bed is flat/ wakes up up feeling not rested, occ napping / 1-2 cups  SABA use: none  02: none  Mild assoc globus/ hb symptoms since onset of doe    No obvious day to day or daytime variability or assoc excess/ purulent sputum or mucus plugs or  hemoptysis or cp or chest tightness, subjective wheeze or overt sinus   symptoms.   Sleeping  without nocturnal  or early am exacerbation  of respiratory  c/o's or need for noct saba. Also denies any obvious fluctuation of symptoms with weather or environmental changes or other aggravating or alleviating factors except as outlined above   No unusual exposure hx or h/o childhood pna/ asthma or knowledge of premature birth.  Current Allergies, Complete Past Medical History, Past Surgical History, Family History, and Social History were reviewed in [employer] record.  ROS  The following are not active complaints unless bolded Hoarseness, sore throat, dysphagia, dental problems, itching, sneezing,  nasal congestion or discharge of excess mucus or purulent secretions, ear ache,   fever, chills, sweats, unintended wt loss or wt gain, classically pleuritic or exertional cp,  orthopnea pnd or arm/hand swelling  or leg swelling, presyncope, palpitations, abdominal pain, anorexia, nausea, vomiting, diarrhea  or change in bowel habits or change in bladder habits, change in stools or change in urine, dysuria, hematuria,  rash, arthralgias, visual complaints, headache, numbness, weakness or ataxia or problems with walking or coordination,  change in mood or  memory.        Current Meds  Medication Sig  . ALPRAZolam (XANAX) 0.5 MG tablet Take 3 tablets (1.5 mg total) by mouth at bedtime.  . calcium-vitamin D (OSCAL WITH D) 500-200 MG-UNIT tablet Take 1 tablet by mouth daily.  . clindamycin (CLEOCIN) 150 MG capsule Take 450 mg by mouth See admin instructions. Take before dental procedures   . Multiple Vitamin (MULTIVITAMIN WITH MINERALS) TABS tablet Take 1 tablet by mouth daily.  . Multiple Vitamins-Minerals (MULTIVITAMIN WITH MINERALS) tablet Take 1 tablet by mouth 2 (two) times daily.   . nitroGLYCERIN (NITROSTAT) 0.4 MG SL tablet Place 1 tablet (0.4 mg total) under the tongue every 5  (five) minutes as needed for chest pain. Go to ER if third tablet is necessary  . omeprazole (PRILOSEC) 20 MG capsule Take 1 capsule (20 mg total) by mouth daily.  . ondansetron (ZOFRAN) 4 MG tablet Take 1 tablet (4 mg total) by mouth every 8 (eight) hours as needed for nausea or vomiting.  . pramipexole (MIRAPEX) 1 MG tablet TAKE 1 TABLET BY MOUTH AT BEDTIME  . prochlorperazine (COMPAZINE) 10 MG tablet Take 1 tablet (10 mg total) by mouth every 6 (six) hours as needed for nausea or vomiting.  . tamoxifen (NOLVADEX) 20 MG tablet Take 1 tablet (20 mg total) by mouth daily.  . valACYclovir (VALTREX) 500 MG tablet Take 1 tablet (500 mg total) by mouth daily as needed (Cold sores).            Past Medical History:  Diagnosis Date  . Anginal pain ([place])   . Anxiety   . Constipation due to opioid therapy   . Degenerative joint disease (DJD) of hip   . GERD (gastroesophageal reflux disease)   . Osteopenia   .  Restless leg syndrome    takes Mirapex and xanax  . Scoliosis   . SUI (stress urinary incontinence, female)   . Vaso-vagal reaction    after back surgery        Objective:    amb wf appears to have high wob at rest but speaking full sentences  Wt Readings from Last 3 Encounters:  04/18/20 177 lb 6.4 oz (80.5 kg)  03/31/20 175 lb 1.6 oz (79.4 kg)  03/28/20 178 lb (80.7 kg)     Vital signs reviewed - Note on arrival 02 sats  97% on RA       HEENT : pt wearing mask not removed for exam due to covid -19 concerns.    NECK :  without JVD/Nodes/TM/ nl carotid upstrokes bilaterally   LUNGS: no acc muscle use,  Nl contour chest with decreased bs R base/ no wheeze or crackles   CV:  RRR  no s3 or murmur or increase in P2, and no edema   ABD:  soft and nontender with nl inspiratory excursion in the supine position. No bruits or organomegaly appreciated, bowel sounds nl  MS:  Nl gait/ ext warm without deformities, calf tenderness, cyanosis or clubbing No obvious joint  restrictions   SKIN: warm and dry without lesions    NEURO:  alert, approp, nl sensorium with  no motor or cerebellar deficits apparent.        CXR PA and Lateral:   04/18/2020 :    I personally reviewed images and agree with radiology impression as follows:  Stable appearing right perihilar soft tissue thickening which may be due to post radiation therapy change. Nodular opacities in the lung bases seen on recent CT are not appreciable by radiographic examination. There is mild bibasilar atelectasis. No edema or consolidation. Heart size normal. No adenopathy is appreciable by radiography.   Labs ordered/ reviewed:      Chemistry      Component Value Date/Time   NA 139 03/31/2020 0904   NA 140 02/25/2019 0955   K 4.3 03/31/2020 0904   CL 108 03/31/2020 0904   CO2 20 (L) 03/31/2020 0904   BUN 11 03/31/2020 0904   BUN 16 02/25/2019 0955   CREATININE 0.70 03/31/2020 0904   CREATININE 0.70 09/10/2017 0848   GLU 91 09/28/2014 0000      Component Value Date/Time   CALCIUM 9.0 03/31/2020 0904   ALKPHOS 49 03/31/2020 0904   AST 22 03/31/2020 0904   ALT 33 03/31/2020 0904   BILITOT 0.4 03/31/2020 0904        Lab Results  Component Value Date   WBC 6.3 03/31/2020   HGB 12.6 03/31/2020   HCT 38.2 03/31/2020   MCV 93.9 03/31/2020   PLT 264 03/31/2020     No results found for: DDIMER    Lab Results  Component Value Date   TSH 1.353 03/31/2020     No results found for: PROBNP    No results found for: [facility]         Assessment

## 2020-04-18 NOTE — Patient Instructions (Addendum)
GERD (REFLUX)  is an extremely common cause of respiratory symptoms just like yours , many times with no obvious heartburn at all.    It can be treated with medication, but also with lifestyle changes including elevation of the head of your bed (ideally with 6 -8inch blocks under the headboard of your bed),  Smoking cessation, avoidance of late meals, excessive alcohol, and avoid fatty foods, chocolate, peppermint, colas, red wine, and acidic juices such as orange juice.  NO MINT OR MENTHOL PRODUCTS SO NO COUGH DROPS  USE SUGARLESS CANDY INSTEAD (Jolley ranchers or Stover's or Life Savers) or even ice chips will also do - the key is to swallow to prevent all throat clearing. NO OIL BASED VITAMINS - use powdered substitutes.  Avoid fish oil when coughing.    Pantoprazole (protonix) 40 mg   Take  30-60 min before first meal of the day and Pepcid (famotidine)  20 mg one after supper  until return to office - this is the best way to tell whether stomach acid is contributing to your problem.    Please remember to go to the  x-ray department  for your tests - we will call you with the results when they are available    .Please schedule a follow up office visit in 6-8  weeks, call sooner if needed with pfts

## 2020-04-20 ENCOUNTER — Encounter: Payer: Self-pay | Admitting: Internal Medicine

## 2020-04-20 NOTE — Progress Notes (Signed)
LMTCB

## 2020-04-20 NOTE — Assessment & Plan Note (Addendum)
01/29/20 Completed RT and feeling fine 02/02/20  Last chemo  W/in a week of last RT started to note doe - CT chest 02/23/20 c/w mild RT pneumonitis RLL  -  04/18/2020   Walked RA  2 laps @ approx 280f each @ fast pace  stopped due to end of study with sats 86%  , min sob   Although she does desat, it only happens walking at a pace that far exceeds her normal and does not explain why she also has sob at rest but not as much with rapid walking when she has a reason to be more sob (also has lower than nl buffering capacity which would explain mild doe but not resting sob)  Most likely doe/desat at high level explained by  mild RT pneumonitis as her symptoms started w/in a few weeks of taking radiation but have not progressed and for now inclined to follow with serial walking sats rather than rx with steroids. a/   Low threshold for CTa (she is on tamoxifen) and check baseline ESR  if sudden change in doe / sats prior to starting steroids in this late setting as not as likely to be helpful as in the acute setting.    >>> In terms of resting sense of sob with globus and mild hb, this may be gerd related so rec max rx x 6 weeks and follow for now.   Discussed in detail all the  indications, usual  risks and alternatives  relative to the benefits with patient who agrees to proceed with Rx as outlined.             Each maintenance medication was reviewed in detail including emphasizing most importantly the difference between maintenance and prns and under what circumstances the prns are to be triggered using an action plan format where appropriate.  Total time for H and P, chart review, counseling,   directly observing portions of ambulatory 02 saturation study/  and generating customized AVS unique to this office visit / charting = 30 min

## 2020-04-28 ENCOUNTER — Inpatient Hospital Stay: Payer: Federal, State, Local not specified - PPO

## 2020-04-28 ENCOUNTER — Encounter: Payer: Self-pay | Admitting: Internal Medicine

## 2020-04-28 ENCOUNTER — Telehealth: Payer: Self-pay

## 2020-04-28 ENCOUNTER — Other Ambulatory Visit: Payer: Self-pay

## 2020-04-28 ENCOUNTER — Inpatient Hospital Stay: Payer: Federal, State, Local not specified - PPO | Attending: Internal Medicine | Admitting: Internal Medicine

## 2020-04-28 VITALS — BP 122/59 | HR 88 | Temp 97.9°F | Resp 17 | Ht 62.0 in | Wt 177.5 lb

## 2020-04-28 DIAGNOSIS — Z79811 Long term (current) use of aromatase inhibitors: Secondary | ICD-10-CM | POA: Diagnosis not present

## 2020-04-28 DIAGNOSIS — Z5112 Encounter for antineoplastic immunotherapy: Secondary | ICD-10-CM

## 2020-04-28 DIAGNOSIS — C3431 Malignant neoplasm of lower lobe, right bronchus or lung: Secondary | ICD-10-CM | POA: Diagnosis not present

## 2020-04-28 DIAGNOSIS — Z170 Estrogen receptor positive status [ER+]: Secondary | ICD-10-CM | POA: Diagnosis not present

## 2020-04-28 DIAGNOSIS — Z923 Personal history of irradiation: Secondary | ICD-10-CM | POA: Diagnosis not present

## 2020-04-28 DIAGNOSIS — R590 Localized enlarged lymph nodes: Secondary | ICD-10-CM | POA: Diagnosis not present

## 2020-04-28 DIAGNOSIS — C50411 Malignant neoplasm of upper-outer quadrant of right female breast: Secondary | ICD-10-CM | POA: Diagnosis present

## 2020-04-28 DIAGNOSIS — C3490 Malignant neoplasm of unspecified part of unspecified bronchus or lung: Secondary | ICD-10-CM | POA: Diagnosis not present

## 2020-04-28 DIAGNOSIS — Z79899 Other long term (current) drug therapy: Secondary | ICD-10-CM | POA: Diagnosis not present

## 2020-04-28 DIAGNOSIS — C3491 Malignant neoplasm of unspecified part of right bronchus or lung: Secondary | ICD-10-CM

## 2020-04-28 LAB — TSH: TSH: 1.34 u[IU]/mL (ref 0.308–3.960)

## 2020-04-28 LAB — CBC WITH DIFFERENTIAL (CANCER CENTER ONLY)
Abs Immature Granulocytes: 0.02 10*3/uL (ref 0.00–0.07)
Basophils Absolute: 0 10*3/uL (ref 0.0–0.1)
Basophils Relative: 0 %
Eosinophils Absolute: 0.2 10*3/uL (ref 0.0–0.5)
Eosinophils Relative: 3 %
HCT: 38.4 % (ref 36.0–46.0)
Hemoglobin: 12.5 g/dL (ref 12.0–15.0)
Immature Granulocytes: 0 %
Lymphocytes Relative: 16 %
Lymphs Abs: 1 10*3/uL (ref 0.7–4.0)
MCH: 29.5 pg (ref 26.0–34.0)
MCHC: 32.6 g/dL (ref 30.0–36.0)
MCV: 90.6 fL (ref 80.0–100.0)
Monocytes Absolute: 0.5 10*3/uL (ref 0.1–1.0)
Monocytes Relative: 8 %
Neutro Abs: 4.9 10*3/uL (ref 1.7–7.7)
Neutrophils Relative %: 73 %
Platelet Count: 155 10*3/uL (ref 150–400)
RBC: 4.24 MIL/uL (ref 3.87–5.11)
RDW: 13.3 % (ref 11.5–15.5)
WBC Count: 6.7 10*3/uL (ref 4.0–10.5)
nRBC: 0 % (ref 0.0–0.2)

## 2020-04-28 LAB — CMP (CANCER CENTER ONLY)
ALT: 32 U/L (ref 0–44)
AST: 21 U/L (ref 15–41)
Albumin: 3.7 g/dL (ref 3.5–5.0)
Alkaline Phosphatase: 49 U/L (ref 38–126)
Anion gap: 12 (ref 5–15)
BUN: 19 mg/dL (ref 8–23)
CO2: 21 mmol/L — ABNORMAL LOW (ref 22–32)
Calcium: 9.4 mg/dL (ref 8.9–10.3)
Chloride: 107 mmol/L (ref 98–111)
Creatinine: 0.72 mg/dL (ref 0.44–1.00)
GFR, Est AFR Am: >60 mL/min (ref >60)
GFR, Estimated: >60 mL/min (ref >60)
Glucose, Bld: 96 mg/dL (ref 70–99)
Potassium: 4.1 mmol/L (ref 3.5–5.1)
Sodium: 140 mmol/L (ref 135–145)
Total Bilirubin: 0.5 mg/dL (ref 0.3–1.2)
Total Protein: 7.3 g/dL (ref 6.5–8.1)

## 2020-04-28 MED ORDER — SODIUM CHLORIDE 0.9 % IV SOLN
Freq: Once | INTRAVENOUS | Status: AC
  Filled 2020-04-28: qty 250

## 2020-04-28 MED ORDER — SODIUM CHLORIDE 0.9 % IV SOLN
1500.0000 mg | Freq: Once | INTRAVENOUS | Status: AC
  Administered 2020-04-28: 1500 mg via INTRAVENOUS
  Filled 2020-04-28: qty 30

## 2020-04-28 NOTE — Progress Notes (Signed)
[place] [phone]) [phone]   [phone]  OFFICE PROGRESS NOTE  Birdie Sons, MD [address] [place] [address]  DIAGNOSIS:  1) Stage IIIa (T1b, N2, M0) non-small cell lung cancer, adenocarcinoma presented with right lower lobe lung nodule in addition to right hilar and mediastinal lymphadenopathy diagnosed in March 2021. 2) history of a stage I (T1c, N0, M0 right breast adenocarcinoma with positive ER/PR status post right lumpectomy followed by adjuvant radiotherapy and currently on treatment with hormonal therapy with Femara.  Molecular studies by foundation 1: BRAF mutation V600E  PDL1 expression 100%.  PRIOR THERAPY:  1) Concurrent chemoradiation with weekly carboplatin for AUC of 2 and paclitaxel 45 NG/M2. Status post5cycles.Completed radiation 01/2020. Completed chemotherapy on 02/09/20  CURRENT THERAPY:  1) Consolidation immunotherapy with Imfinzi 1500 mg IV every 4 weeks.  First dose on 03/03/2020.  Status post 2 cycles. 2) tamoxifen 20 mg p.o. daily.  INTERVAL HISTORY: Elizabeth [name] 69 y.o. female returns to the clinic today for follow-up visit accompanied by her husband.  The patient continues to have mild cough and shortness of breath with exertion but her oxygen saturation is usually around 95-97%.  She denied having any chest pain or hemoptysis.  She denied having any fever or chills.  She has no nausea, vomiting, diarrhea or constipation.  She denied having any headache or visual changes.  She is here today for evaluation before starting cycle #3 of her treatment.  MEDICAL HISTORY: Past Medical History:  Diagnosis Date  . Anginal pain ([place])   . Anxiety   . Cancer [facility])    breast cancer (radical lumpectomy and radiation  . Complication of anesthesia    one time woke when she had a tube down throat and had a panic attack  . Constipation due to opioid therapy   . Degenerative joint disease (DJD)  of hip   . GERD (gastroesophageal reflux disease)   . Headache    migraines when she was working  . Osteopenia   . Pneumonia   . Restless leg syndrome    takes Mirapex and xanax  . Scoliosis   . SUI (stress urinary incontinence, female)   . Vaso-vagal reaction    after back surgery    ALLERGIES:  is allergic to zostavax [zoster vaccine live], naproxen, penicillins, zoloft [sertraline hcl], and latex.  MEDICATIONS:  Current Outpatient Medications  Medication Sig Dispense Refill  . ALPRAZolam (XANAX) 0.5 MG tablet Take 3 tablets (1.5 mg total) by mouth at bedtime.    . calcium-vitamin D (OSCAL WITH D) 500-200 MG-UNIT tablet Take 1 tablet by mouth daily.    . clindamycin (CLEOCIN) 150 MG capsule Take 450 mg by mouth See admin instructions. Take before dental procedures     . famotidine (PEPCID) 20 MG tablet One after supper 30 tablet 11  . Multiple Vitamin (MULTIVITAMIN WITH MINERALS) TABS tablet Take 1 tablet by mouth daily.    . Multiple Vitamins-Minerals (MULTIVITAMIN WITH MINERALS) tablet Take 1 tablet by mouth 2 (two) times daily.     . nitroGLYCERIN (NITROSTAT) 0.4 MG SL tablet Place 1 tablet (0.4 mg total) under the tongue every 5 (five) minutes as needed for chest pain. Go to ER if third tablet is necessary 30 tablet 5  . ondansetron (ZOFRAN) 4 MG tablet Take 1 tablet (4 mg total) by mouth every 8 (eight) hours as needed for nausea or vomiting. 20 tablet 0  . pantoprazole (PROTONIX) 40 MG  tablet Take 1 tablet (40 mg total) by mouth daily. Take 30-60 min before first meal of the day 30 tablet 2  . pramipexole (MIRAPEX) 1 MG tablet TAKE 1 TABLET BY MOUTH AT BEDTIME 30 tablet 12  . prochlorperazine (COMPAZINE) 10 MG tablet Take 1 tablet (10 mg total) by mouth every 6 (six) hours as needed for nausea or vomiting. 30 tablet 0  . tamoxifen (NOLVADEX) 20 MG tablet Take 1 tablet (20 mg total) by mouth daily. 90 tablet 3  . valACYclovir (VALTREX) 500 MG tablet Take 1 tablet (500 mg total)  by mouth daily as needed (Cold sores).     No current facility-administered medications for this visit.    SURGICAL HISTORY:  Past Surgical History:  Procedure Laterality Date  . APPENDECTOMY  1963  . BACK SURGERY    . BREAST BIOPSY Right 04/27/2019   Affirm Bx "X" clip-INVASIVE MAMMARY CARCINOMA,  . BREAST CYST ASPIRATION Right 1988  . BREAST EXCISIONAL BIOPSY Right 1990   benign x 3  . BREAST LUMPECTOMY Right 05/08/2019  . BREAST LUMPECTOMY WITH SENTINEL LYMPH NODE BIOPSY Right 05/08/2019   Procedure: RIGHT BREAST LUMPECTOMY WITH SENTINEL LYMPH NODE BX AND WIRE LOC., LATEX ALLERGY;  Surgeon: Jules Husbands, MD;  Location: [facility];  Service: General;  Laterality: Right;  . BREAST SURGERY    . CATARACT EXTRACTION W/PHACO Right 03/31/2018   Procedure: CATARACT EXTRACTION PHACO AND INTRAOCULAR LENS PLACEMENT ([place]) RIGHT;  Surgeon: Leandrew Koyanagi, MD;  Location: [place];  Service: Ophthalmology;  Laterality: Right;  Latex sensitivity  . CATARACT EXTRACTION W/PHACO Left 04/16/2018   Procedure: CATARACT EXTRACTION PHACO AND INTRAOCULAR LENS PLACEMENT ([place])  LEFT;  Surgeon: Leandrew Koyanagi, MD;  Location: [place];  Service: Ophthalmology;  Laterality: Left;  . EYE SURGERY     Lasik  . FINE NEEDLE ASPIRATION  12/22/2019   Procedure: FINE NEEDLE ASPIRATION (FNA) LINEAR;  Surgeon: Collene Gobble, MD;  Location: [place] [facility];  Service: Pulmonary;;  . FOOT SURGERY Bilateral   . HYSTEROTOMY    . LAMINECTOMY  2006  . ROTATOR CUFF REPAIR Bilateral   . TONSILLECTOMY     age 53  . TOTAL HIP ARTHROPLASTY Right 01/18/2015   Procedure: RIGHT TOTAL HIP ARTHROPLASTY ANTERIOR APPROACH;  Surgeon: Renette Butters, MD;  Location: [place];  Service: Orthopedics;  Laterality: Right;  . TUBAL LIGATION    . VIDEO BRONCHOSCOPY WITH ENDOBRONCHIAL ULTRASOUND N/A 12/22/2019   Procedure: VIDEO BRONCHOSCOPY WITH ENDOBRONCHIAL ULTRASOUND;  Surgeon: Collene Gobble, MD;  Location: [facility]  ENDOSCOPY;  Service: Pulmonary;  Laterality: N/A;    REVIEW OF SYSTEMS:  A comprehensive review of systems was negative except for: Respiratory: positive for dyspnea on exertion   PHYSICAL EXAMINATION: General appearance: alert, cooperative and no distress Head: Normocephalic, without obvious abnormality, atraumatic Neck: no adenopathy, no JVD, supple, symmetrical, trachea midline and thyroid not enlarged, symmetric, no tenderness/mass/nodules Lymph nodes: Cervical, supraclavicular, and axillary nodes normal. Resp: clear to auscultation bilaterally Back: symmetric, no curvature. ROM normal. No CVA tenderness. Cardio: regular rate and rhythm, S1, S2 normal, no murmur, click, rub or gallop GI: soft, non-tender; bowel sounds normal; no masses,  no organomegaly Extremities: extremities normal, atraumatic, no cyanosis or edema  ECOG PERFORMANCE STATUS: 1 - Symptomatic but completely ambulatory  Blood pressure (!) 122/59, pulse 88, temperature 97.9 F (36.6 C), temperature source Temporal, resp. rate 17, height _0  (1.575 m), weight 177 lb 8 oz (80.5 kg), SpO2 96 %.  LABORATORY  DATA: Lab Results  Component Value Date   WBC 6.7 04/28/2020   HGB 12.5 04/28/2020   HCT 38.4 04/28/2020   MCV 90.6 04/28/2020   PLT 155 04/28/2020      Chemistry      Component Value Date/Time   NA 139 03/31/2020 0904   NA 140 02/25/2019 0955   K 4.3 03/31/2020 0904   CL 108 03/31/2020 0904   CO2 20 (L) 03/31/2020 0904   BUN 11 03/31/2020 0904   BUN 16 02/25/2019 0955   CREATININE 0.70 03/31/2020 0904   CREATININE 0.70 09/10/2017 0848   GLU 91 09/28/2014 0000      Component Value Date/Time   CALCIUM 9.0 03/31/2020 0904   ALKPHOS 49 03/31/2020 0904   AST 22 03/31/2020 0904   ALT 33 03/31/2020 0904   BILITOT 0.4 03/31/2020 0904       RADIOGRAPHIC STUDIES: DG Chest 2 View  Result Date: 04/18/2020 CLINICAL DATA:  Shortness of breath. Reported history of lung carcinoma on the right. EXAM: CHEST  - 2 VIEW COMPARISON:  Chest radiograph October 23, 2016; chest CT Feb 23, 2020 FINDINGS: Mild thickening in the right perihilar region appear stable compared to recent CT examination. A previously noted nodular opacity in the left base seen on CT is not appreciable by radiography. A lesion in the medial right base seen on CT is also not appreciable. There is mild atelectasis in the lung bases. There is no frank edema or airspace opacity. The heart size and pulmonary vascularity are normal. No adenopathy. There is midthoracic dextroscoliosis with thoracolumbar levoscoliosis. There is postoperative change in the right shoulder. IMPRESSION: Stable appearing right perihilar soft tissue thickening which may be due to post radiation therapy change. Nodular opacities in the lung bases seen on recent CT are not appreciable by radiographic examination. There is mild bibasilar atelectasis. No edema or consolidation. Heart size normal. No adenopathy is appreciable by radiography. Electronically Signed   By: Lowella Grip III M.D.   On: 04/18/2020 11:50    ASSESSMENT AND PLAN: This is a very pleasant 69 years old white female with a stage IIIa non-small cell lung cancer, adenocarcinoma in March 2021 and presented with right lower lobe lung nodule in addition to right hilar and mediastinal lymphadenopathy.  Molecular studies showed positive for BRAF mutation V600E and PD-L1 expression was 100%. The patient also has a history of stage I right breast carcinoma and currently on treatment with Femara. She completed a course of concurrent chemoradiation with weekly carboplatin and paclitaxel for 5 weeks.  Her radiotherapy was discontinued earlier because of the previous radiation to the right breast. The patient is currently undergoing consolidation treatment with immunotherapy with Imfinzi status post 2 cycles.  She continues to tolerate her treatment fairly well with no concerning adverse effects. I recommended for the  patient to proceed with cycle #3 today as planned. I will see her back for follow-up visit in 4 weeks for evaluation after repeating CT scan of the chest for restaging of her disease. For the history of breast cancer, she will continue her current treatment with tamoxifen 20 mg p.o. daily.  I will give her a refill as needed. The patient was advised to call immediately if she has any concerning symptoms in the interval. The patient voices understanding of current disease status and treatment options and is in agreement with the current care plan.  All questions were answered. The patient knows to call the clinic with any problems, questions or  concerns. We can certainly see the patient much sooner if necessary.  Disclaimer: This note was dictated with voice recognition software. Similar sounding words can inadvertently be transcribed and may not be corrected upon review.

## 2020-04-28 NOTE — Patient Instructions (Signed)
[facility] Discharge Instructions for Patients Receiving Chemotherapy  Today you received the following chemotherapy agents: Imfinzi.  To help prevent nausea and vomiting after your treatment, we encourage you to take your nausea medication as directed.   If you develop nausea and vomiting that is not controlled by your nausea medication, call the clinic.   BELOW ARE SYMPTOMS THAT SHOULD BE REPORTED IMMEDIATELY:  *FEVER GREATER THAN 100.5 F  *CHILLS WITH OR WITHOUT FEVER  NAUSEA AND VOMITING THAT IS NOT CONTROLLED WITH YOUR NAUSEA MEDICATION  *UNUSUAL SHORTNESS OF BREATH  *UNUSUAL BRUISING OR BLEEDING  TENDERNESS IN MOUTH AND THROAT WITH OR WITHOUT PRESENCE OF ULCERS  *URINARY PROBLEMS  *BOWEL PROBLEMS  UNUSUAL RASH Items with * indicate a potential emergency and should be followed up as soon as possible.  Feel free to call the clinic should you have any questions or concerns. The clinic phone number is [phone].  Please show the CHEMO ALERT CARD at check-in to the Emergency Department and triage nurse.   

## 2020-04-28 NOTE — Telephone Encounter (Signed)
Copied from [place] [id number]. Topic: Referral - Request for Referral >> Apr 28, 2020  1:27 PM Scherrie Gerlach wrote: Has patient seen PCP for this complaint? no *If NO, is insurance requiring patient see PCP for this issue before PCP can refer them? Pt states Dr Julien Nordmann has found issues that she needs to see a cardiologist.  Pt states she needs a referral from Dr Caryn Section to see Dr Burt Knack at [facility].  No appts with Dr Caryn Section. Please advise.

## 2020-04-29 ENCOUNTER — Telehealth: Payer: Self-pay | Admitting: Internal Medicine

## 2020-04-29 NOTE — Telephone Encounter (Signed)
Scheduled per los. Called and left msg. Mailed printout  °

## 2020-05-02 NOTE — Telephone Encounter (Signed)
Appointment scheduled with Dr. Caryn Section tomorrow 05/03/2020 at 1:40pm.

## 2020-05-03 ENCOUNTER — Ambulatory Visit (INDEPENDENT_AMBULATORY_CARE_PROVIDER_SITE_OTHER): Payer: Federal, State, Local not specified - PPO | Admitting: Family Medicine

## 2020-05-03 ENCOUNTER — Other Ambulatory Visit: Payer: Self-pay

## 2020-05-03 ENCOUNTER — Encounter: Payer: Self-pay | Admitting: Family Medicine

## 2020-05-03 VITALS — BP 126/70 | HR 94 | Temp 96.9°F | Wt 178.0 lb

## 2020-05-03 DIAGNOSIS — I700 Atherosclerosis of aorta: Secondary | ICD-10-CM | POA: Diagnosis not present

## 2020-05-03 DIAGNOSIS — Z170 Estrogen receptor positive status [ER+]: Secondary | ICD-10-CM

## 2020-05-03 DIAGNOSIS — R6884 Jaw pain: Secondary | ICD-10-CM

## 2020-05-03 DIAGNOSIS — M25471 Effusion, right ankle: Secondary | ICD-10-CM | POA: Diagnosis not present

## 2020-05-03 DIAGNOSIS — R0600 Dyspnea, unspecified: Secondary | ICD-10-CM

## 2020-05-03 DIAGNOSIS — R0609 Other forms of dyspnea: Secondary | ICD-10-CM

## 2020-05-03 DIAGNOSIS — L03114 Cellulitis of left upper limb: Secondary | ICD-10-CM

## 2020-05-03 DIAGNOSIS — Z1231 Encounter for screening mammogram for malignant neoplasm of breast: Secondary | ICD-10-CM

## 2020-05-03 DIAGNOSIS — C50411 Malignant neoplasm of upper-outer quadrant of right female breast: Secondary | ICD-10-CM

## 2020-05-03 MED ORDER — SULFAMETHOXAZOLE-TRIMETHOPRIM 800-160 MG PO TABS: 1.0000 | ORAL_TABLET | Freq: Two times a day (BID) | ORAL | 0 refills | Status: DC

## 2020-05-03 MED ORDER — NITROGLYCERIN 0.4 MG SL SUBL: 0.4000 mg | SUBLINGUAL_TABLET | SUBLINGUAL | 5 refills | Status: AC | PRN

## 2020-05-03 NOTE — Progress Notes (Signed)
Established patient visit   Patient: Elizabeth [name]   DOB: 01-Nov-1950   69 y.o. Female  MRN: [id number] Visit Date: 05/03/2020  Today's healthcare provider: Lelon Huh, MD   Chief Complaint  Patient presents with  . Referral   Subjective    HPI   Pt needs a referral to a cardiologist Dr. Sherren Mocha secondary to her last CT Scan.  Pt states she is very short of breath.  Pt is not sure if it is coming from her lung cancer, or treatment, or a heart issue. She has been treated for breast and lung cancer and was noted to have atherosclerosis of thoracic aorta. She does have occasional pain going into left jaw for which she take nitroglycerine which makes it resolve. 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 Only previous cardiac work up appears to be normal Myoview in 2003 and echo in 2006.   Pt also woke up this morning with right ankle swelling/pain.   She denies any injury.        Medications: Outpatient Medications Prior to Visit  Medication Sig  . ALPRAZolam (XANAX) 0.5 MG tablet Take 3 tablets (1.5 mg total) by mouth at bedtime.  . calcium-vitamin D (OSCAL WITH D) 500-200 MG-UNIT tablet Take 1 tablet by mouth daily.  . clindamycin (CLEOCIN) 150 MG capsule Take 450 mg by mouth See admin instructions. Take before dental procedures   . famotidine (PEPCID) 20 MG tablet One after supper  . Multiple Vitamin (MULTIVITAMIN WITH MINERALS) TABS tablet Take 1 tablet by mouth daily.  . Multiple Vitamins-Minerals (MULTIVITAMIN WITH MINERALS) tablet Take 1 tablet by mouth 2 (two) times daily.   . nitroGLYCERIN (NITROSTAT) 0.4 MG SL tablet Place 1 tablet (0.4 mg total) under the tongue every 5 (five) minutes as needed for chest pain. Go to ER if third tablet is necessary  . ondansetron (ZOFRAN) 4 MG tablet Take 1 tablet (4 mg total) by mouth every 8 (eight) hours as needed for nausea or vomiting.  . pantoprazole  (PROTONIX) 40 MG tablet Take 1 tablet (40 mg total) by mouth daily. Take 30-60 min before first meal of the day  . pramipexole (MIRAPEX) 1 MG tablet TAKE 1 TABLET BY MOUTH AT BEDTIME  . prochlorperazine (COMPAZINE) 10 MG tablet Take 1 tablet (10 mg total) by mouth every 6 (six) hours as needed for nausea or vomiting.  . tamoxifen (NOLVADEX) 20 MG tablet Take 1 tablet (20 mg total) by mouth daily.  . valACYclovir (VALTREX) 500 MG tablet Take 1 tablet (500 mg total) by mouth daily as needed (Cold sores).   No facility-administered medications prior to visit.    Review of Systems  Constitutional: Negative.   Respiratory: Positive for shortness of breath and wheezing. Negative for apnea, cough, chest tightness and stridor.   Cardiovascular: Positive for leg swelling. Negative for chest pain.  Gastrointestinal: Negative.   Musculoskeletal: Negative.   Neurological: Negative for dizziness, light-headedness and headaches.      Objective    BP 126/70 (BP Location: Right Arm, Patient Position: Sitting, Cuff Size: Large)   Pulse 94   Temp (!) 96.9 F (36.1 C) (Temporal)   Wt 178 lb (80.7 kg)   SpO2 94%   BMI 32.56 kg/m    Physical Exam  General appearance: Overweight female, cooperative and in no acute distress Head: Normocephalic, without obvious abnormality, atraumatic Respiratory: Respirations even and unlabored, normal respiratory rate Extremities: All extremities are  intact.  Skin: Skin color, texture, turgor normal. No rashes seen  Psych: Appropriate mood and affect. Neurologic: Mental status: Alert, oriented to person, place, and time, thought content appropriate.   No results found for any visits on 05/03/20.  Assessment & Plan     1. Thoracic aortic atherosclerosis ([place]) She has had occasionally episodes of chest pains and left jaw pain since the early 2000s and still takes occasional nitroglycerine that was attributed coronary vasospasm back then. Last cardiology consult  looks to be around 2006.  - Ambulatory referral to Cardiology  2. Right ankle swelling She has similar episode on her left arm in April which resolved with  sulfamethoxazole-trimethoprim (BACTRIM DS) 800-160 MG tablet; Take 1 tablet by mouth 2 (two) times daily.  Dispense: 14 tablet; Refill: 0  3. DOE (dyspnea on exertion) Likely secondary to underlying lung cancer. Has follow up schedule with Dr Melvyn Novas. Cardiology referral is also in the works as above.   4. Jaw pain  - Ambulatory referral to Cardiology - nitroGLYCERIN (NITROSTAT) 0.4 MG SL tablet; Place 1 tablet (0.4 mg total) under the tongue every 5 (five) minutes as needed for chest pain. Go to ER if third tablet is necessary  Dispense: 30 tablet; Refill: 5  5. Malignant neoplasm of upper-outer quadrant of right breast in female, estrogen receptor positive ([place]) Continue follow up oncology as scheduled.   7. Encounter for screening mammogram for malignant neoplasm of breast  - MM Digital Diagnostic Bilat; Future   No follow-ups on file.      The entirety of the information documented in the History of Present Illness, Review of Systems and Physical Exam were personally obtained by me. Portions of this information were initially documented by the CMA and reviewed by me for thoroughness and accuracy.      Lelon Huh, MD  [facility] [phone] (phone) [phone] (fax)  [place]

## 2020-05-04 ENCOUNTER — Telehealth: Payer: Self-pay | Admitting: *Deleted

## 2020-05-04 NOTE — Telephone Encounter (Signed)
-----   Message from Curt Bears, MD sent at 05/04/2020 12:17 PM EDT ----- We can discuss her side effects from the immunotherapy during the next visit but if her oxygen saturation dropped significantly to less than 87 or if she has any other significant adverse effects she may need to go to the emergency room for evaluation. Thank you. ----- Message ----- From: Merril Abbe, LPN Sent: 1/61/0960  11:57 AM EDT To: Curt Bears, MD  Pt called stating she's having swelling in ankles and pcp prescribed bactrim for swelling. Also O2 has been 89-93% when waking up. She states that she thinks she's having a lot of reactions to the immunotherapy and would like to be advised about it. Please advise

## 2020-05-04 NOTE — Telephone Encounter (Signed)
Per Dr.mohamed, called to advised pt of message below. Pt verbalized understanding.

## 2020-05-05 ENCOUNTER — Encounter: Payer: Self-pay | Admitting: Cardiology

## 2020-05-05 ENCOUNTER — Ambulatory Visit (INDEPENDENT_AMBULATORY_CARE_PROVIDER_SITE_OTHER): Payer: Federal, State, Local not specified - PPO | Admitting: Cardiology

## 2020-05-05 ENCOUNTER — Other Ambulatory Visit: Payer: Self-pay

## 2020-05-05 VITALS — BP 118/72 | HR 85 | Ht 62.0 in | Wt 176.0 lb

## 2020-05-05 DIAGNOSIS — R079 Chest pain, unspecified: Secondary | ICD-10-CM | POA: Diagnosis not present

## 2020-05-05 DIAGNOSIS — R0609 Other forms of dyspnea: Secondary | ICD-10-CM

## 2020-05-05 DIAGNOSIS — I700 Atherosclerosis of aorta: Secondary | ICD-10-CM | POA: Diagnosis not present

## 2020-05-05 DIAGNOSIS — R072 Precordial pain: Secondary | ICD-10-CM | POA: Diagnosis not present

## 2020-05-05 DIAGNOSIS — R0600 Dyspnea, unspecified: Secondary | ICD-10-CM | POA: Diagnosis not present

## 2020-05-05 MED ORDER — METOPROLOL TARTRATE 100 MG PO TABS: 100.0000 mg | ORAL_TABLET | Freq: Once | ORAL | 0 refills | Status: DC

## 2020-05-05 NOTE — Progress Notes (Signed)
Cardiology Office Note:    Date:  05/05/2020   ID:  Elizabeth [name], DOB 12/28/1950, MRN [id number]  PCP:  Birdie Sons, MD  [facility] Cardiologist:  Kate Sable, MD  [place] Electrophysiologist:  None   Referring MD: Birdie Sons, MD   Chief Complaint  Patient presents with  . New Patient (Initial Visit)    Referred by PCP for jaw pain and Thoracic aortic atherosclerosis. meds reviewed verbally with patient.    Elizabeth [name] is a 69 y.o. female who is being seen today for the evaluation of chest pain at the request of Birdie Sons, MD.   History of Present Illness:    Elizabeth [name] is a 69 y.o. female with a hx of anxiety, breast cancer, lung cancer, prior smoker x25 years, GERD who presents due to chest pain.  Patient states having chest pain for years, was prescribed nitroglycerin in the past to help resolve pain.  She states chest pain radiates to her left jaw.  She describes pain as pressure on her left chest, rates events 5 out of 10 when it occurs.  She had a recent follow-up of chest CT due to history of lung cancer and chemo showing atherosclerosis in the thoracic aorta.  Patient also endorses shortness of breath with exertion since her cancer diagnosis which has worsened over the past several months.  She was diagnosed with breast cancer last year and underwent lumpectomy and radiation therapy.  Diagnosed with lung cancer, deemed inoperable and has been on chemotherapy.  She currently is taking immunomodulator Durvalumab which she states causes her some lower extremity swelling.  She is not sure if her shortness of breath is due to her lung versus cardiac.  Patient had a exercise Myoview at [facility] in 2017 which was normal.  There is documented report of normal echocardiogram in 2020  Past Medical History:  Diagnosis Date  . Anginal pain ([place])   . Anxiety   . Cancer [facility])    breast cancer (radical lumpectomy and  radiation  . Complication of anesthesia    one time woke when she had a tube down throat and had a panic attack  . Constipation due to opioid therapy   . Degenerative joint disease (DJD) of hip   . GERD (gastroesophageal reflux disease)   . Headache    migraines when she was working  . Osteopenia   . Pneumonia   . Restless leg syndrome    takes Mirapex and xanax  . Scoliosis   . SUI (stress urinary incontinence, female)   . Vaso-vagal reaction    after back surgery    Past Surgical History:  Procedure Laterality Date  . APPENDECTOMY  1963  . BACK SURGERY    . BREAST BIOPSY Right 04/27/2019   Affirm Bx "X" clip-INVASIVE MAMMARY CARCINOMA,  . BREAST CYST ASPIRATION Right 1988  . BREAST EXCISIONAL BIOPSY Right 1990   benign x 3  . BREAST LUMPECTOMY Right 05/08/2019  . BREAST LUMPECTOMY WITH SENTINEL LYMPH NODE BIOPSY Right 05/08/2019   Procedure: RIGHT BREAST LUMPECTOMY WITH SENTINEL LYMPH NODE BX AND WIRE LOC., LATEX ALLERGY;  Surgeon: Jules Husbands, MD;  Location: [facility];  Service: General;  Laterality: Right;  . BREAST SURGERY    . CATARACT EXTRACTION W/PHACO Right 03/31/2018   Procedure: CATARACT EXTRACTION PHACO AND INTRAOCULAR LENS PLACEMENT ([place]) RIGHT;  Surgeon: Leandrew Koyanagi, MD;  Location: [place];  Service: Ophthalmology;  Laterality: Right;  Latex sensitivity  . CATARACT  EXTRACTION W/PHACO Left 04/16/2018   Procedure: CATARACT EXTRACTION PHACO AND INTRAOCULAR LENS PLACEMENT ([place])  LEFT;  Surgeon: Leandrew Koyanagi, MD;  Location: [place];  Service: Ophthalmology;  Laterality: Left;  . EYE SURGERY     Lasik  . FINE NEEDLE ASPIRATION  12/22/2019   Procedure: FINE NEEDLE ASPIRATION (FNA) LINEAR;  Surgeon: Collene Gobble, MD;  Location: [place] [facility];  Service: Pulmonary;;  . FOOT SURGERY Bilateral   . HYSTEROTOMY    . LAMINECTOMY  2006  . ROTATOR CUFF REPAIR Bilateral   . TONSILLECTOMY     age 61  . TOTAL HIP ARTHROPLASTY Right  01/18/2015   Procedure: RIGHT TOTAL HIP ARTHROPLASTY ANTERIOR APPROACH;  Surgeon: Renette Butters, MD;  Location: [place];  Service: Orthopedics;  Laterality: Right;  . TUBAL LIGATION    . VIDEO BRONCHOSCOPY WITH ENDOBRONCHIAL ULTRASOUND N/A 12/22/2019   Procedure: VIDEO BRONCHOSCOPY WITH ENDOBRONCHIAL ULTRASOUND;  Surgeon: Collene Gobble, MD;  Location: [facility];  Service: Pulmonary;  Laterality: N/A;    Current Medications: Current Meds  Medication Sig  . ALPRAZolam (XANAX) 0.5 MG tablet Take 3 tablets (1.5 mg total) by mouth at bedtime.  . calcium-vitamin D (OSCAL WITH D) 500-200 MG-UNIT tablet Take 1 tablet by mouth daily.  . clindamycin (CLEOCIN) 150 MG capsule Take 450 mg by mouth See admin instructions. Take before dental procedures   . famotidine (PEPCID) 20 MG tablet One after supper  . Multiple Vitamin (MULTIVITAMIN WITH MINERALS) TABS tablet Take 1 tablet by mouth daily.  . Multiple Vitamins-Minerals (MULTIVITAMIN WITH MINERALS) tablet Take 1 tablet by mouth 2 (two) times daily.   . nitroGLYCERIN (NITROSTAT) 0.4 MG SL tablet Place 1 tablet (0.4 mg total) under the tongue every 5 (five) minutes as needed for chest pain. Go to ER if third tablet is necessary  . ondansetron (ZOFRAN) 4 MG tablet Take 1 tablet (4 mg total) by mouth every 8 (eight) hours as needed for nausea or vomiting.  . pantoprazole (PROTONIX) 40 MG tablet Take 1 tablet (40 mg total) by mouth daily. Take 30-60 min before first meal of the day  . pramipexole (MIRAPEX) 1 MG tablet TAKE 1 TABLET BY MOUTH AT BEDTIME  . prochlorperazine (COMPAZINE) 10 MG tablet Take 1 tablet (10 mg total) by mouth every 6 (six) hours as needed for nausea or vomiting.  . sulfamethoxazole-trimethoprim (BACTRIM DS) 800-160 MG tablet Take 1 tablet by mouth 2 (two) times daily.  . tamoxifen (NOLVADEX) 20 MG tablet Take 1 tablet (20 mg total) by mouth daily.  . valACYclovir (VALTREX) 500 MG tablet Take 1 tablet (500 mg total) by mouth daily as  needed (Cold sores).     Allergies:   Zostavax [zoster vaccine live], Naproxen, Penicillins, Zoloft [sertraline hcl], and Latex   Social History   Socioeconomic History  . Marital status: Married    Spouse name: Not on file  . Number of children: Not on file  . Years of education: Not on file  . Highest education level: Not on file  Occupational History  . Not on file  Tobacco Use  . Smoking status: Former Smoker    Packs/day: 0.50    Years: 20.00    Pack years: 10.00    Types: Cigarettes    Quit date: 01/07/2012    Years since quitting: 8.3  . Smokeless tobacco: Never Used  Vaping Use  . Vaping Use: Never used  Substance and Sexual Activity  . Alcohol use: Yes    Alcohol/week:  5.0 standard drinks    Types: 5 Glasses of wine per week    Comment: red wine  . Drug use: No  . Sexual activity: Yes    Birth control/protection: Post-menopausal  Other Topics Concern  . Not on file  Social History Narrative  . Not on file   Social Determinants of Health   Financial Resource Strain:   . Difficulty of Paying Living Expenses:   Food Insecurity:   . Worried About Charity fundraiser in the Last Year:   . Arboriculturist in the Last Year:   Transportation Needs:   . Film/video editor (Medical):   Marland Kitchen Lack of Transportation (Non-Medical):   Physical Activity:   . Days of Exercise per Week:   . Minutes of Exercise per Session:   Stress:   . Feeling of Stress :   Social Connections:   . Frequency of Communication with Friends and Family:   . Frequency of Social Gatherings with Friends and Family:   . Attends Religious Services:   . Active Member of Clubs or Organizations:   . Attends Archivist Meetings:   Marland Kitchen Marital Status:      Family History: The patient's family history includes Alcohol abuse in her brother and mother; Breast cancer (age of onset: 50) in her daughter; Cancer in her brother and daughter; Cancer (age of onset: 30) in her mother; Cancer (age  of onset: 82) in her father; Diabetes in her father; Migraines in her mother. There is no history of Bladder Cancer or Kidney cancer.  ROS:   Please see the history of present illness.     All other systems reviewed and are negative.  EKGs/Labs/Other Studies Reviewed:    The following studies were reviewed today:   EKG:  EKG is  ordered today.  The ekg ordered today demonstrates normal sinus rhythm, possible left atrial enlargement  Recent Labs: 04/28/2020: ALT 32; BUN 19; Creatinine 0.72; Hemoglobin 12.5; Platelet Count 155; Potassium 4.1; Sodium 140; TSH 1.340  Recent Lipid Panel    Component Value Date/Time   CHOL 211 (H) 02/25/2019 0955   TRIG 121 02/25/2019 0955   HDL 88 02/25/2019 0955   CHOLHDL 2.4 02/25/2019 0955   LDLCALC 99 02/25/2019 0955    Physical Exam:    VS:  BP 118/72 (BP Location: Left Arm, Patient Position: Sitting, Cuff Size: Normal)   Pulse 85   Ht '5\' 2"'$  (1.575 m)   Wt 176 lb (79.8 kg)   BMI 32.19 kg/m     Wt Readings from Last 3 Encounters:  05/05/20 176 lb (79.8 kg)  05/03/20 178 lb (80.7 kg)  04/28/20 177 lb 8 oz (80.5 kg)     GEN:  Well nourished, well developed in no acute distress HEENT: Normal NECK: No JVD; No carotid bruits LYMPHATICS: No lymphadenopathy CARDIAC: RRR, no murmurs, rubs, gallops RESPIRATORY:  Clear to auscultation without rales, wheezing or rhonchi  ABDOMEN: Soft, non-tender, non-distended MUSCULOSKELETAL:  No edema; No deformity  SKIN: Warm and dry NEUROLOGIC:  Alert and oriented x 3 PSYCHIATRIC:  Normal affect   ASSESSMENT:    1. Chest pain of uncertain etiology   2. Dyspnea on exertion   3. Thoracic aorta atherosclerosis ([place])   4. Precordial pain    PLAN:    In order of problems listed above:  1. Patient with chest discomfort consistent with angina for some time now, especially as discomfort is relieved with nitro.  She has risk factors  of prior smoker.  She was diagnosed with vasospasm in the past.  Prior  exercise Myoview with no evidence for ischemia.  Will evaluate patient with coronary CTA for presence of CAD.  Get echocardiogram to evaluate cardiac function. 2. Patient with worsening shortness of breath.  Due to history of smoking, and lung cancer status post chemo might be contributing.  Get echo possible to evaluate any structural pathology. 3. Thoracic aortic atherosclerosis on recent chest CT.  Prior smoking likely contributing.  Last LDL controlled.  Not likely the cause for patient's symptoms of angina or shortness of breath.  Follow-up after echocardiogram and coronary CTA.   Medication Adjustments/Labs and Tests Ordered: Current medicines are reviewed at length with the patient today.  Concerns regarding medicines are outlined above.  Orders Placed This Encounter  Procedures  . CT CORONARY MORPH W/CTA COR W/SCORE W/CA W/CM &/OR WO/CM  . CT CORONARY FRACTIONAL FLOW RESERVE DATA PREP  . CT CORONARY FRACTIONAL FLOW RESERVE FLUID ANALYSIS  . EKG 12-Lead  . ECHOCARDIOGRAM COMPLETE   Meds ordered this encounter  Medications  . metoprolol tartrate (LOPRESSOR) 100 MG tablet    Sig: Take 1 tablet (100 mg total) by mouth once for 1 dose. Take 2 hours prior to your CT scan.    Dispense:  1 tablet    Refill:  0    Patient Instructions  Medication Instructions:   Your physician recommends that you continue on your current medications as directed. Please refer to the Current Medication list given to you today.  *If you need a refill on your cardiac medications before your next appointment, please call your pharmacy*   Lab Work: None Ordered If you have labs (blood work) drawn today and your tests are completely normal, you will receive your results only by: Marland Kitchen MyChart Message (if you have MyChart) OR . A paper copy in the mail If you have any lab test that is abnormal or we need to change your treatment, we will call you to review the results.   Testing/Procedures:  1.  Your  physician has requested that you have an echocardiogram. Echocardiography is a painless test that uses sound waves to create images of your heart. It provides your doctor with information about the size and shape of your heart and how well your heart's chambers and valves are working. This procedure takes approximately one hour. There are no restrictions for this procedure.  2.  Your physician has requested that you have cardiac CT. Cardiac computed tomography (CT) is a painless test that uses an x-ray machine to take clear, detailed pictures of your heart.   Your cardiac CT will be scheduled at one of the below locations:   [facility] [address] [place], [place] [address] [phone]  [place] [address] [place], [place] [address] [phone]  If scheduled at [facility], please arrive at the [facility] main entrance of [facility] 30 minutes prior to test start time. Proceed to the [facility] Radiology Department (first floor) to check-in and test prep.  If scheduled at [facility], please arrive 15 mins early for check-in and test prep.  Please follow these instructions carefully (unless otherwise directed):   On the Night Before the Test: . Be sure to Drink plenty of water. . Do not consume any caffeinated/decaffeinated beverages or chocolate 12 hours prior to your test. . Do not take any antihistamines 12  hours prior to your test.  On the Day of the Test: . Drink plenty of water. Do not drink any water within one hour of the test. . Do not eat any food 4 hours prior to the test. . You may take your regular medications prior to the test.  . Take metoprolol (Lopressor) two hours prior to test. . FEMALES- please wear underwire-free bra if available        After the Test: . Drink plenty of water. . After receiving IV contrast, you may experience a mild  flushed feeling. This is normal. . On occasion, you may experience a mild rash up to 24 hours after the test. This is not dangerous. If this occurs, you can take Benadryl 25 mg and increase your fluid intake. . If you experience trouble breathing, this can be serious. If it is severe call 911 IMMEDIATELY. If it is mild, please call our office.    Once we have confirmed authorization from your insurance company, we will call you to set up a date and time for your test. Based on how quickly your insurance processes prior authorizations requests, please allow up to 4 weeks to be contacted for scheduling your Cardiac CT appointment. Be advised that routine Cardiac CT appointments could be scheduled as many as 8 weeks after your provider has ordered it.  For non-scheduling related questions, please contact the cardiac imaging nurse navigator should you have any questions/concerns: Marchia Bond, Cardiac Imaging Nurse Navigator Burley Saver, Interim Cardiac Imaging Nurse [facility] Direct Office Dial: [phone]   For scheduling needs, including cancellations and rescheduling, please call Vivien Rota at [phone], option 3.    Follow-Up: At [facility], you and your health needs are our priority.  As part of our continuing mission to provide you with exceptional heart care, we have created designated Provider Care Teams.  These Care Teams include your primary Cardiologist (physician) and Advanced Practice Providers (APPs -  Physician Assistants and Nurse Practitioners) who all work together to provide you with the care you need, when you need it.  We recommend signing up for the patient portal called "MyChart".  Sign up information is provided on this After Visit Summary.  MyChart is used to connect with patients for Virtual Visits (Telemedicine).  Patients are able to view lab/test results, encounter notes, upcoming appointments, etc.  Non-urgent messages can be sent  to your provider as well.   To learn more about what you can do with MyChart, go to [email or web address].    Your next appointment:   Follow up after CTA and Echo   The format for your next appointment:   In Person  Provider:   Kate Sable, MD   Other Instructions   Echocardiogram An echocardiogram is a procedure that uses painless sound waves (ultrasound) to produce an image of the heart. Images from an echocardiogram can provide important information about:  Signs of coronary artery disease (CAD).  Aneurysm detection. An aneurysm is a weak or damaged part of an artery wall that bulges out from the normal force of blood pumping through the body.  Heart size and shape. Changes in the size or shape of the heart can be associated with certain conditions, including heart failure, aneurysm, and CAD.  Heart muscle function.  Heart valve function.  Signs of a past heart attack.  Fluid buildup around the heart.  Thickening of the heart muscle.  A tumor or infectious growth around the heart valves. Tell  a health care provider about:  Any allergies you have.  All medicines you are taking, including vitamins, herbs, eye drops, creams, and over-the-counter medicines.  Any blood disorders you have.  Any surgeries you have had.  Any medical conditions you have.  Whether you are pregnant or may be pregnant. What are the risks? Generally, this is a safe procedure. However, problems may occur, including:  Allergic reaction to dye (contrast) that may be used during the procedure. What happens before the procedure? No specific preparation is needed. You may eat and drink normally. What happens during the procedure?   An IV tube may be inserted into one of your veins.  You may receive contrast through this tube. A contrast is an injection that improves the quality of the pictures from your heart.  A gel will be applied to your chest.  A wand-like tool  (transducer) will be moved over your chest. The gel will help to transmit the sound waves from the transducer.  The sound waves will harmlessly bounce off of your heart to allow the heart images to be captured in real-time motion. The images will be recorded on a computer. The procedure may vary among health care providers and hospitals. What happens after the procedure?  You may return to your normal, everyday life, including diet, activities, and medicines, unless your health care provider tells you not to do that. Summary  An echocardiogram is a procedure that uses painless sound waves (ultrasound) to produce an image of the heart.  Images from an echocardiogram can provide important information about the size and shape of your heart, heart muscle function, heart valve function, and fluid buildup around your heart.  You do not need to do anything to prepare before this procedure. You may eat and drink normally.  After the echocardiogram is completed, you may return to your normal, everyday life, unless your health care provider tells you not to do that. This information is not intended to replace advice given to you by your health care provider. Make sure you discuss any questions you have with your health care provider. Document Revised: 01/15/2019 Document Reviewed: 10/27/2016 Elsevier Patient Education  2020 Murphy, Kate Sable, MD  05/05/2020 12:35 PM    [place]

## 2020-05-05 NOTE — Patient Instructions (Signed)
Medication Instructions:   Your physician recommends that you continue on your current medications as directed. Please refer to the Current Medication list given to you today.  *If you need a refill on your cardiac medications before your next appointment, please call your pharmacy*   Lab Work: None Ordered If you have labs (blood work) drawn today and your tests are completely normal, you will receive your results only by: Marland Kitchen MyChart Message (if you have MyChart) OR . A paper copy in the mail If you have any lab test that is abnormal or we need to change your treatment, we will call you to review the results.   Testing/Procedures:  1.  Your physician has requested that you have an echocardiogram. Echocardiography is a painless test that uses sound waves to create images of your heart. It provides your doctor with information about the size and shape of your heart and how well your heart's chambers and valves are working. This procedure takes approximately one hour. There are no restrictions for this procedure.  2.  Your physician has requested that you have cardiac CT. Cardiac computed tomography (CT) is a painless test that uses an x-ray machine to take clear, detailed pictures of your heart.   Your cardiac CT will be scheduled at one of the below locations:   [facility] [address] [place], [place] [address] [phone]  [address] [place], [place] [address] [phone]  If scheduled at [facility], please arrive at the [facility] main entrance of [facility] 30 minutes prior to test start time. Proceed to the [facility] Radiology Department (first floor) to check-in and test prep.  If scheduled at [facility], please arrive 15 mins early for check-in and test prep.  Please follow these instructions carefully (unless otherwise directed):   On the  Night Before the Test: . Be sure to Drink plenty of water. . Do not consume any caffeinated/decaffeinated beverages or chocolate 12 hours prior to your test. . Do not take any antihistamines 12 hours prior to your test.  On the Day of the Test: . Drink plenty of water. Do not drink any water within one hour of the test. . Do not eat any food 4 hours prior to the test. . You may take your regular medications prior to the test.  . Take metoprolol (Lopressor) two hours prior to test. . FEMALES- please wear underwire-free bra if available        After the Test: . Drink plenty of water. . After receiving IV contrast, you may experience a mild flushed feeling. This is normal. . On occasion, you may experience a mild rash up to 24 hours after the test. This is not dangerous. If this occurs, you can take Benadryl 25 mg and increase your fluid intake. . If you experience trouble breathing, this can be serious. If it is severe call 911 IMMEDIATELY. If it is mild, please call our office.    Once we have confirmed authorization from your insurance company, we will call you to set up a date and time for your test. Based on how quickly your insurance processes prior authorizations requests, please allow up to 4 weeks to be contacted for scheduling your Cardiac CT appointment. Be advised that routine Cardiac CT appointments could be scheduled as many as 8 weeks after your provider has ordered it.  For non-scheduling related questions, please contact  the cardiac imaging nurse navigator should you have any questions/concerns: Marchia Bond, Cardiac Imaging Nurse Navigator Burley Saver, Interim Cardiac Imaging Nurse Navigator [facility] Direct Office Dial: [phone]   For scheduling needs, including cancellations and rescheduling, please call Vivien Rota at [phone], option 3.    Follow-Up: At [facility], you and your health needs are our priority.  As part of our  continuing mission to provide you with exceptional heart care, we have created designated Provider Care Teams.  These Care Teams include your primary Cardiologist (physician) and Advanced Practice Providers (APPs -  Physician Assistants and Nurse Practitioners) who all work together to provide you with the care you need, when you need it.  We recommend signing up for the patient portal called "MyChart".  Sign up information is provided on this After Visit Summary.  MyChart is used to connect with patients for Virtual Visits (Telemedicine).  Patients are able to view lab/test results, encounter notes, upcoming appointments, etc.  Non-urgent messages can be sent to your provider as well.   To learn more about what you can do with MyChart, go to [email or web address].    Your next appointment:   Follow up after CTA and Echo   The format for your next appointment:   In Person  Provider:   Kate Sable, MD   Other Instructions   Echocardiogram An echocardiogram is a procedure that uses painless sound waves (ultrasound) to produce an image of the heart. Images from an echocardiogram can provide important information about:  Signs of coronary artery disease (CAD).  Aneurysm detection. An aneurysm is a weak or damaged part of an artery wall that bulges out from the normal force of blood pumping through the body.  Heart size and shape. Changes in the size or shape of the heart can be associated with certain conditions, including heart failure, aneurysm, and CAD.  Heart muscle function.  Heart valve function.  Signs of a past heart attack.  Fluid buildup around the heart.  Thickening of the heart muscle.  A tumor or infectious growth around the heart valves. Tell a health care provider about:  Any allergies you have.  All medicines you are taking, including vitamins, herbs, eye drops, creams, and over-the-counter medicines.  Any blood disorders you have.  Any surgeries you  have had.  Any medical conditions you have.  Whether you are pregnant or may be pregnant. What are the risks? Generally, this is a safe procedure. However, problems may occur, including:  Allergic reaction to dye (contrast) that may be used during the procedure. What happens before the procedure? No specific preparation is needed. You may eat and drink normally. What happens during the procedure?   An IV tube may be inserted into one of your veins.  You may receive contrast through this tube. A contrast is an injection that improves the quality of the pictures from your heart.  A gel will be applied to your chest.  A wand-like tool (transducer) will be moved over your chest. The gel will help to transmit the sound waves from the transducer.  The sound waves will harmlessly bounce off of your heart to allow the heart images to be captured in real-time motion. The images will be recorded on a computer. The procedure may vary among health care providers and hospitals. What happens after the procedure?  You may return to your normal, everyday life, including diet, activities, and medicines, unless your health care provider tells you not to  do that. Summary  An echocardiogram is a procedure that uses painless sound waves (ultrasound) to produce an image of the heart.  Images from an echocardiogram can provide important information about the size and shape of your heart, heart muscle function, heart valve function, and fluid buildup around your heart.  You do not need to do anything to prepare before this procedure. You may eat and drink normally.  After the echocardiogram is completed, you may return to your normal, everyday life, unless your health care provider tells you not to do that. This information is not intended to replace advice given to you by your health care provider. Make sure you discuss any questions you have with your health care provider. Document Revised: 01/15/2019  Document Reviewed: 10/27/2016 Elsevier Patient Education  [place].

## 2020-05-11 ENCOUNTER — Other Ambulatory Visit (HOSPITAL_COMMUNITY): Payer: Self-pay | Admitting: *Deleted

## 2020-05-11 ENCOUNTER — Telehealth (HOSPITAL_COMMUNITY): Payer: Self-pay | Admitting: *Deleted

## 2020-05-11 MED ORDER — METOPROLOL TARTRATE 100 MG PO TABS: 100.0000 mg | ORAL_TABLET | Freq: Once | ORAL | 0 refills | Status: DC

## 2020-05-11 NOTE — Telephone Encounter (Signed)
Reaching out to patient to offer assistance regarding upcoming cardiac imaging study; pt verbalizes understanding of appt date/time, parking situation and where to check in, pre-test NPO status and medications ordered, and verified current allergies; name and call back number provided for further questions should they arise  Roshun Klingensmith Tai RN Navigator Cardiac Imaging [facility] [phone] office [phone] cell 

## 2020-05-12 ENCOUNTER — Other Ambulatory Visit (HOSPITAL_COMMUNITY): Payer: Self-pay | Admitting: *Deleted

## 2020-05-12 ENCOUNTER — Emergency Department: Payer: Medicare Other

## 2020-05-12 ENCOUNTER — Ambulatory Visit
Admission: RE | Admit: 2020-05-12 | Discharge: 2020-05-12 | Disposition: A | Discharge status: Home / Self Care | Payer: Federal, State, Local not specified - PPO | Source: Ambulatory Visit | Attending: Cardiology | Admitting: Cardiology

## 2020-05-12 ENCOUNTER — Other Ambulatory Visit: Payer: Self-pay

## 2020-05-12 ENCOUNTER — Telehealth: Payer: Self-pay | Admitting: Medical Oncology

## 2020-05-12 ENCOUNTER — Inpatient Hospital Stay
Admission: EM | Admit: 2020-05-12 | Discharge: 2020-05-16 | DRG: 180 | Disposition: A | Discharge status: Home / Self Care | Payer: Medicare Other | Attending: Internal Medicine | Admitting: Internal Medicine

## 2020-05-12 DIAGNOSIS — G2581 Restless legs syndrome: Secondary | ICD-10-CM | POA: Diagnosis present

## 2020-05-12 DIAGNOSIS — Z833 Family history of diabetes mellitus: Secondary | ICD-10-CM

## 2020-05-12 DIAGNOSIS — I2699 Other pulmonary embolism without acute cor pulmonale: Secondary | ICD-10-CM | POA: Diagnosis present

## 2020-05-12 DIAGNOSIS — Z801 Family history of malignant neoplasm of trachea, bronchus and lung: Secondary | ICD-10-CM

## 2020-05-12 DIAGNOSIS — Z880 Allergy status to penicillin: Secondary | ICD-10-CM

## 2020-05-12 DIAGNOSIS — R600 Localized edema: Secondary | ICD-10-CM | POA: Diagnosis present

## 2020-05-12 DIAGNOSIS — R079 Chest pain, unspecified: Secondary | ICD-10-CM

## 2020-05-12 DIAGNOSIS — C3430 Malignant neoplasm of lower lobe, unspecified bronchus or lung: Secondary | ICD-10-CM

## 2020-05-12 DIAGNOSIS — I824Z1 Acute embolism and thrombosis of unspecified deep veins of right distal lower extremity: Secondary | ICD-10-CM | POA: Diagnosis present

## 2020-05-12 DIAGNOSIS — R072 Precordial pain: Secondary | ICD-10-CM | POA: Diagnosis present

## 2020-05-12 DIAGNOSIS — C3491 Malignant neoplasm of unspecified part of right bronchus or lung: Secondary | ICD-10-CM | POA: Diagnosis present

## 2020-05-12 DIAGNOSIS — M1610 Unilateral primary osteoarthritis, unspecified hip: Secondary | ICD-10-CM | POA: Diagnosis present

## 2020-05-12 DIAGNOSIS — G43909 Migraine, unspecified, not intractable, without status migrainosus: Secondary | ICD-10-CM | POA: Diagnosis present

## 2020-05-12 DIAGNOSIS — Z9225 Personal history of immunosupression therapy: Secondary | ICD-10-CM | POA: Diagnosis not present

## 2020-05-12 DIAGNOSIS — M8580 Other specified disorders of bone density and structure, unspecified site: Secondary | ICD-10-CM | POA: Diagnosis present

## 2020-05-12 DIAGNOSIS — Z888 Allergy status to other drugs, medicaments and biological substances status: Secondary | ICD-10-CM | POA: Diagnosis not present

## 2020-05-12 DIAGNOSIS — Z79810 Long term (current) use of selective estrogen receptor modulators (SERMs): Secondary | ICD-10-CM

## 2020-05-12 DIAGNOSIS — Z91040 Latex allergy status: Secondary | ICD-10-CM | POA: Diagnosis not present

## 2020-05-12 DIAGNOSIS — M419 Scoliosis, unspecified: Secondary | ICD-10-CM | POA: Diagnosis present

## 2020-05-12 DIAGNOSIS — R590 Localized enlarged lymph nodes: Secondary | ICD-10-CM | POA: Diagnosis present

## 2020-05-12 DIAGNOSIS — K219 Gastro-esophageal reflux disease without esophagitis: Secondary | ICD-10-CM | POA: Diagnosis present

## 2020-05-12 DIAGNOSIS — Z9221 Personal history of antineoplastic chemotherapy: Secondary | ICD-10-CM

## 2020-05-12 DIAGNOSIS — I2693 Single subsegmental pulmonary embolism without acute cor pulmonale: Secondary | ICD-10-CM | POA: Diagnosis not present

## 2020-05-12 DIAGNOSIS — Z923 Personal history of irradiation: Secondary | ICD-10-CM | POA: Diagnosis not present

## 2020-05-12 DIAGNOSIS — C771 Secondary and unspecified malignant neoplasm of intrathoracic lymph nodes: Secondary | ICD-10-CM | POA: Diagnosis present

## 2020-05-12 DIAGNOSIS — R0602 Shortness of breath: Secondary | ICD-10-CM | POA: Diagnosis not present

## 2020-05-12 DIAGNOSIS — Z79899 Other long term (current) drug therapy: Secondary | ICD-10-CM

## 2020-05-12 DIAGNOSIS — J9621 Acute and chronic respiratory failure with hypoxia: Secondary | ICD-10-CM | POA: Diagnosis not present

## 2020-05-12 DIAGNOSIS — Z96641 Presence of right artificial hip joint: Secondary | ICD-10-CM | POA: Diagnosis present

## 2020-05-12 DIAGNOSIS — J9601 Acute respiratory failure with hypoxia: Secondary | ICD-10-CM | POA: Diagnosis present

## 2020-05-12 DIAGNOSIS — Z9851 Tubal ligation status: Secondary | ICD-10-CM

## 2020-05-12 DIAGNOSIS — Z20822 Contact with and (suspected) exposure to covid-19: Secondary | ICD-10-CM | POA: Diagnosis present

## 2020-05-12 DIAGNOSIS — Z803 Family history of malignant neoplasm of breast: Secondary | ICD-10-CM

## 2020-05-12 DIAGNOSIS — J910 Malignant pleural effusion: Secondary | ICD-10-CM | POA: Diagnosis present

## 2020-05-12 DIAGNOSIS — R0902 Hypoxemia: Secondary | ICD-10-CM

## 2020-05-12 DIAGNOSIS — C50911 Malignant neoplasm of unspecified site of right female breast: Secondary | ICD-10-CM | POA: Diagnosis present

## 2020-05-12 DIAGNOSIS — I82451 Acute embolism and thrombosis of right peroneal vein: Secondary | ICD-10-CM | POA: Diagnosis present

## 2020-05-12 DIAGNOSIS — Z8042 Family history of malignant neoplasm of prostate: Secondary | ICD-10-CM

## 2020-05-12 DIAGNOSIS — J9690 Respiratory failure, unspecified, unspecified whether with hypoxia or hypercapnia: Secondary | ICD-10-CM | POA: Diagnosis present

## 2020-05-12 DIAGNOSIS — J90 Pleural effusion, not elsewhere classified: Secondary | ICD-10-CM | POA: Diagnosis not present

## 2020-05-12 DIAGNOSIS — J9811 Atelectasis: Secondary | ICD-10-CM | POA: Diagnosis present

## 2020-05-12 DIAGNOSIS — Z87891 Personal history of nicotine dependence: Secondary | ICD-10-CM

## 2020-05-12 DIAGNOSIS — R0600 Dyspnea, unspecified: Secondary | ICD-10-CM | POA: Diagnosis present

## 2020-05-12 DIAGNOSIS — F329 Major depressive disorder, single episode, unspecified: Secondary | ICD-10-CM | POA: Diagnosis present

## 2020-05-12 DIAGNOSIS — R0609 Other forms of dyspnea: Secondary | ICD-10-CM | POA: Diagnosis present

## 2020-05-12 DIAGNOSIS — Z9889 Other specified postprocedural states: Secondary | ICD-10-CM

## 2020-05-12 DIAGNOSIS — Z811 Family history of alcohol abuse and dependence: Secondary | ICD-10-CM

## 2020-05-12 LAB — CBC WITH DIFFERENTIAL/PLATELET
Abs Immature Granulocytes: 0.03 10*3/uL (ref 0.00–0.07)
Basophils Absolute: 0 10*3/uL (ref 0.0–0.1)
Basophils Relative: 0 %
Eosinophils Absolute: 0.3 10*3/uL (ref 0.0–0.5)
Eosinophils Relative: 4 %
HCT: 39 % (ref 36.0–46.0)
Hemoglobin: 12.8 g/dL (ref 12.0–15.0)
Immature Granulocytes: 0 %
Lymphocytes Relative: 12 %
Lymphs Abs: 1.1 10*3/uL (ref 0.7–4.0)
MCH: 29.6 pg (ref 26.0–34.0)
MCHC: 32.8 g/dL (ref 30.0–36.0)
MCV: 90.3 fL (ref 80.0–100.0)
Monocytes Absolute: 0.6 10*3/uL (ref 0.1–1.0)
Monocytes Relative: 7 %
Neutro Abs: 6.7 10*3/uL (ref 1.7–7.7)
Neutrophils Relative %: 77 %
Platelets: 258 10*3/uL (ref 150–400)
RBC: 4.32 MIL/uL (ref 3.87–5.11)
RDW: 13.1 % (ref 11.5–15.5)
WBC: 8.7 10*3/uL (ref 4.0–10.5)
nRBC: 0 % (ref 0.0–0.2)

## 2020-05-12 LAB — URINALYSIS, COMPLETE (UACMP) WITH MICROSCOPIC
Bacteria, UA: NONE SEEN
Bilirubin Urine: NEGATIVE
Glucose, UA: NEGATIVE mg/dL
Hgb urine dipstick: NEGATIVE
Ketones, ur: NEGATIVE mg/dL
Leukocytes,Ua: NEGATIVE
Nitrite: NEGATIVE
Protein, ur: NEGATIVE mg/dL
Specific Gravity, Urine: 1.01 (ref 1.005–1.030)
pH: 5 (ref 5.0–8.0)

## 2020-05-12 LAB — COMPREHENSIVE METABOLIC PANEL
ALT: 35 U/L (ref 0–44)
AST: 25 U/L (ref 15–41)
Albumin: 3.6 g/dL (ref 3.5–5.0)
Alkaline Phosphatase: 39 U/L (ref 38–126)
Anion gap: 9 (ref 5–15)
BUN: 16 mg/dL (ref 8–23)
CO2: 21 mmol/L — ABNORMAL LOW (ref 22–32)
Calcium: 8.4 mg/dL — ABNORMAL LOW (ref 8.9–10.3)
Chloride: 108 mmol/L (ref 98–111)
Creatinine, Ser: 0.53 mg/dL (ref 0.44–1.00)
GFR calc Af Amer: >60 mL/min (ref >60)
GFR calc non Af Amer: >60 mL/min (ref >60)
Glucose, Bld: 119 mg/dL — ABNORMAL HIGH (ref 70–99)
Potassium: 4.1 mmol/L (ref 3.5–5.1)
Sodium: 138 mmol/L (ref 135–145)
Total Bilirubin: 0.8 mg/dL (ref 0.3–1.2)
Total Protein: 6.7 g/dL (ref 6.5–8.1)

## 2020-05-12 LAB — APTT: aPTT: 27 seconds (ref 24–36)

## 2020-05-12 LAB — PROTIME-INR
INR: 1 (ref 0.8–1.2)
Prothrombin Time: 13.2 seconds (ref 11.4–15.2)

## 2020-05-12 LAB — T4, FREE: Free T4: 1.01 ng/dL (ref 0.61–1.12)

## 2020-05-12 LAB — BRAIN NATRIURETIC PEPTIDE: B Natriuretic Peptide: 35.6 pg/mL (ref 0.0–100.0)

## 2020-05-12 LAB — LACTIC ACID, PLASMA: Lactic Acid, Venous: 1.3 mmol/L (ref 0.5–1.9)

## 2020-05-12 LAB — SARS CORONAVIRUS 2 BY RT PCR (HOSPITAL ORDER, PERFORMED IN ~~LOC~~ HOSPITAL LAB): SARS Coronavirus 2: NEGATIVE

## 2020-05-12 LAB — TSH: TSH: 1.561 u[IU]/mL (ref 0.350–4.500)

## 2020-05-12 MED ORDER — METOPROLOL TARTRATE 5 MG/5ML IV SOLN
10.0000 mg | Freq: Once | INTRAVENOUS | Status: AC
  Administered 2020-05-12: 10 mg via INTRAVENOUS

## 2020-05-12 MED ORDER — HEPARIN BOLUS VIA INFUSION
4000.0000 [IU] | Freq: Once | INTRAVENOUS | Status: AC
  Administered 2020-05-12: 4000 [IU] via INTRAVENOUS
  Filled 2020-05-12: qty 4000

## 2020-05-12 MED ORDER — SODIUM CHLORIDE 0.9% FLUSH
3.0000 mL | Freq: Two times a day (BID) | INTRAVENOUS | Status: DC
  Administered 2020-05-13 – 2020-05-16 (×7): 3 mL via INTRAVENOUS

## 2020-05-12 MED ORDER — TAMOXIFEN CITRATE 10 MG PO TABS
20.0000 mg | ORAL_TABLET | Freq: Every day | ORAL | Status: DC
  Administered 2020-05-13 – 2020-05-16 (×4): 20 mg via ORAL
  Filled 2020-05-12 (×5): qty 2

## 2020-05-12 MED ORDER — SODIUM CHLORIDE 0.9 % IV SOLN: 250.0000 mL | INTRAVENOUS | Status: DC | PRN

## 2020-05-12 MED ORDER — NITROGLYCERIN 0.4 MG SL SUBL
0.8000 mg | SUBLINGUAL_TABLET | Freq: Once | SUBLINGUAL | Status: AC
  Administered 2020-05-12: 0.8 mg via SUBLINGUAL

## 2020-05-12 MED ORDER — GUAIFENESIN ER 600 MG PO TB12
600.0000 mg | ORAL_TABLET | Freq: Two times a day (BID) | ORAL | Status: DC
  Administered 2020-05-12 – 2020-05-16 (×7): 600 mg via ORAL
  Filled 2020-05-12 (×10): qty 1

## 2020-05-12 MED ORDER — ONDANSETRON HCL 4 MG PO TABS: 4.0000 mg | ORAL_TABLET | Freq: Three times a day (TID) | ORAL | Status: DC | PRN

## 2020-05-12 MED ORDER — ALBUTEROL SULFATE (2.5 MG/3ML) 0.083% IN NEBU
2.5000 mg | INHALATION_SOLUTION | Freq: Four times a day (QID) | RESPIRATORY_TRACT | Status: DC
  Administered 2020-05-13 (×3): 2.5 mg via RESPIRATORY_TRACT
  Filled 2020-05-12 (×5): qty 3

## 2020-05-12 MED ORDER — IOHEXOL 350 MG/ML SOLN
85.0000 mL | Freq: Once | INTRAVENOUS | Status: AC | PRN
  Administered 2020-05-12: 85 mL via INTRAVENOUS

## 2020-05-12 MED ORDER — ALBUTEROL SULFATE (2.5 MG/3ML) 0.083% IN NEBU
2.5000 mg | INHALATION_SOLUTION | RESPIRATORY_TRACT | Status: DC | PRN
  Administered 2020-05-14 – 2020-05-16 (×3): 2.5 mg via RESPIRATORY_TRACT
  Filled 2020-05-12 (×2): qty 3

## 2020-05-12 MED ORDER — PRAMIPEXOLE DIHYDROCHLORIDE 1 MG PO TABS
1.0000 mg | ORAL_TABLET | Freq: Every day | ORAL | Status: DC
  Administered 2020-05-13 – 2020-05-15 (×4): 1 mg via ORAL
  Filled 2020-05-12 (×5): qty 1

## 2020-05-12 MED ORDER — ALPRAZOLAM 1 MG PO TABS
1.5000 mg | ORAL_TABLET | Freq: Every day | ORAL | Status: DC
  Administered 2020-05-12: 1.5 mg via ORAL
  Filled 2020-05-12: qty 3

## 2020-05-12 MED ORDER — HEPARIN (PORCINE) 25000 UT/250ML-% IV SOLN
1000.0000 [IU]/h | INTRAVENOUS | Status: DC
  Administered 2020-05-12: 1000 [IU]/h via INTRAVENOUS
  Filled 2020-05-12: qty 250

## 2020-05-12 MED ORDER — CALCIUM CARBONATE-VITAMIN D 500-200 MG-UNIT PO TABS
1.0000 | ORAL_TABLET | Freq: Every day | ORAL | Status: DC
  Administered 2020-05-13 – 2020-05-16 (×4): 1 via ORAL
  Filled 2020-05-12 (×4): qty 1

## 2020-05-12 MED ORDER — IOHEXOL 350 MG/ML SOLN
75.0000 mL | Freq: Once | INTRAVENOUS | Status: AC | PRN
  Administered 2020-05-12: 75 mL via INTRAVENOUS

## 2020-05-12 MED ORDER — VALACYCLOVIR HCL 500 MG PO TABS
500.0000 mg | ORAL_TABLET | Freq: Every day | ORAL | Status: DC | PRN
  Filled 2020-05-12: qty 1

## 2020-05-12 MED ORDER — SODIUM CHLORIDE 0.9% FLUSH: 3.0000 mL | INTRAVENOUS | Status: DC | PRN

## 2020-05-12 MED ORDER — SODIUM CHLORIDE 0.9% FLUSH: 3.0000 mL | Freq: Once | INTRAVENOUS | Status: DC

## 2020-05-12 MED ORDER — IVABRADINE HCL 5 MG PO TABS: ORAL_TABLET | ORAL | 0 refills | Status: AC

## 2020-05-12 MED ORDER — METOPROLOL TARTRATE 100 MG PO TABS: 100.0000 mg | ORAL_TABLET | Freq: Once | ORAL | 0 refills | Status: AC

## 2020-05-12 MED ORDER — DILTIAZEM HCL 25 MG/5ML IV SOLN: 10.0000 mg | Freq: Once | INTRAVENOUS | Status: DC

## 2020-05-12 NOTE — Progress Notes (Signed)
Patient unable to get Cardiac CT completed due to heart rate. Dr Garen Lah ordered patient to be rescheduled for next Thursday with pre medications Metoprolol 100 mg PO and Ivabradine 10 mg PO. Patient verbalized understanding and can be scheduled at anytime next Thursday 05/19/2020.

## 2020-05-12 NOTE — Progress Notes (Signed)
ANTICOAGULATION CONSULT NOTE - Initial Consult  Pharmacy Consult for Heparin Indication: pulmonary embolus  Allergies  Allergen Reactions  . Zostavax [Zoster Vaccine Live] Rash  . Penicillins Hives    Did it involve swelling of the face/tongue/throat, SOB, or low BP? No Did it involve sudden or severe rash/hives, skin peeling, or any reaction on the inside of your mouth or nose? Yes Did you need to seek medical attention at a hospital or doctor's office? Yes When did it last happen?14 or 15 If all above answers are "NO", may proceed with cephalosporin use.   . Naproxen Hives  . Zoloft [Sertraline Hcl] Hives  . Latex Itching and Rash    Condoms   Patient Measurements: Height: 5\' 2"  (157.5 cm) Weight: 78.9 kg (174 lb) IBW/kg (Calculated) : 50.1 HEPARIN DW (KG): 67.5  Vital Signs: Temp: 98.9 F (37.2 C) (08/05 1641) Temp Source: Oral (08/05 1641) BP: 115/71 (08/05 1800) Pulse Rate: 79 (08/05 1800)  Labs: Recent Labs    05/12/20 1659  HGB 12.8  HCT 39.0  PLT 258  CREATININE 0.53    Estimated Creatinine Clearance: 66.4 mL/min (by C-G formula based on SCr of 0.53 mg/dL).   Medical History: Past Medical History:  Diagnosis Date  . Anginal pain ([place])   . Anxiety   . Breast cancer, right ([place]) 04/2019   breast cancer (radical lumpectomy and radiation  . Complication of anesthesia    one time woke when she had a tube down throat and had a panic attack  . Constipation due to opioid therapy   . Degenerative joint disease (DJD) of hip   . GERD (gastroesophageal reflux disease)   . Headache    migraines when she was working  . Osteopenia   . Pneumonia   . Restless leg syndrome    takes Mirapex and xanax  . Scoliosis   . SUI (stress urinary incontinence, female)   . Vaso-vagal reaction    after back surgery    Medications:  (Not in a hospital admission)   Assessment: Heparin initiated for PE.  No anticoagulants per PTA med list.  Baseline labs  ordered.  Goal of Therapy:  Heparin level 0.3-0.7 units/ml Monitor platelets by anticoagulation protocol: Yes   Plan:  Heparin bolus 4000 units x 1 then infusion at 1000 units/hr Check HL ~ 6 hours after heparin started  Hart Robinsons A 05/12/2020,9:58 PM

## 2020-05-12 NOTE — Assessment & Plan Note (Signed)
Continue patient's home regimen.

## 2020-05-12 NOTE — Telephone Encounter (Signed)
"  Can Dr Julien Nordmann order some oxygen for me ?" Pt sob on phone-Stated "I am having 12 of the side effects since the Imfinzi treatment including :   Worsening SOB -   wheezing  rash inner thighs-started 2 days ago   Numbness right leg   swelling   constipation  joint pain   3rd dose Imfinzi was given 7/22.  Today she  was given Metoprolol in imaging for Cardiac CT. The test was held due to her SOB and inability  to lie flat on table.  Yaneth said they gave her oxygen.   She was prescribed  Bactrim on 7/27 for UTI.  I instructed pt to go to ED now. She said she will go to [facility] ED.

## 2020-05-12 NOTE — ED Triage Notes (Signed)
Pt arrives via POV with c/o shob, constipation, bilateral hand tingling, left leg tingling and pain, decreased urination, and exhaustion since receiving chemo on 07/22. Pt with labored breathing after ambulating from lobby to triage room. O2 90% on RA. PT A&Ox4, skin warm and dry.

## 2020-05-12 NOTE — H&P (Signed)
History and Physical    Elizabeth [name] HYW:[id number] DOB: 1951/07/05 DOA: 05/12/2020  Referring MD/NP/PA: Dr.Mohammed. PCP: Birdie Sons, MD   Patient coming from: home  Chief Complaint: Acute hypoxic respiratory failure.  HPI: Elizabeth [name] is a 69 y.o. female with medical history significant of intermittent shortness of breath with dyspnea of exertion and orthopnea since middle of July of this year.  Patient has been under evaluation for her shortness of breath with her oncologist and an echocardiogram is scheduled.  When patient noticed that she is having worsening shortness of breath with shorter distances she relayed this information to the nurse and was advised to come to the emergency room.  Oncologist note reviewed with recommendations for evaluation of immunotherapy mediated pneumonitis.  Discussed with patient that her shortness of breath is most likely due to the moderate-sized pleural effusion found on chest x-ray however would like to pursue CT scan of her chest with contrast to identify any pulmonary embolism but also for any malignancy, as she is at risk for malignancies patient has a history of breast and lung cancer.  Also at bedside both agree with plan of care all questions answered review of systems is negative for any other symptoms.  ED Course:  In the emergency room patient is alert awake and oriented on 2 L of nasal cannula with O2 sats above 90% patient is comfortable not tachypneic and not tachycardic.  She is giving history. Labs are stable.  An EKG done is sinus rhythm with a heart rate of 89.  Chest x-ray shows worsening interstitial opacities and a new moderate left pleural effusion.  Review of Systems: As per HPI otherwise 10 point review of systems negative.    Past Medical History:  Diagnosis Date  . Anginal pain ([place])   . Anxiety   . Breast cancer, right ([place]) 04/2019   breast cancer (radical lumpectomy and radiation  .  Complication of anesthesia    one time woke when she had a tube down throat and had a panic attack  . Constipation due to opioid therapy   . Degenerative joint disease (DJD) of hip   . GERD (gastroesophageal reflux disease)   . Headache    migraines when she was working  . Osteopenia   . Pneumonia   . Restless leg syndrome    takes Mirapex and xanax  . Scoliosis   . SUI (stress urinary incontinence, female)   . Vaso-vagal reaction    after back surgery    Past Surgical History:  Procedure Laterality Date  . APPENDECTOMY  1963  . BACK SURGERY    . BREAST BIOPSY Right 04/27/2019   Affirm Bx "X" clip-INVASIVE MAMMARY CARCINOMA,  . BREAST CYST ASPIRATION Right 1988  . BREAST EXCISIONAL BIOPSY Right 1990   benign x 3  . BREAST LUMPECTOMY Right 05/08/2019  . BREAST LUMPECTOMY WITH SENTINEL LYMPH NODE BIOPSY Right 05/08/2019   Procedure: RIGHT BREAST LUMPECTOMY WITH SENTINEL LYMPH NODE BX AND WIRE LOC., LATEX ALLERGY;  Surgeon: Jules Husbands, MD;  Location: [facility];  Service: General;  Laterality: Right;  . BREAST SURGERY    . CATARACT EXTRACTION W/PHACO Right 03/31/2018   Procedure: CATARACT EXTRACTION PHACO AND INTRAOCULAR LENS PLACEMENT ([place]) RIGHT;  Surgeon: Leandrew Koyanagi, MD;  Location: [place];  Service: Ophthalmology;  Laterality: Right;  Latex sensitivity  . CATARACT EXTRACTION W/PHACO Left 04/16/2018   Procedure: CATARACT EXTRACTION PHACO AND INTRAOCULAR LENS PLACEMENT ([place])  LEFT;  Surgeon: Leandrew Koyanagi, MD;  Location: [place];  Service: Ophthalmology;  Laterality: Left;  . EYE SURGERY     Lasik  . FINE NEEDLE ASPIRATION  12/22/2019   Procedure: FINE NEEDLE ASPIRATION (FNA) LINEAR;  Surgeon: Collene Gobble, MD;  Location: [facility];  Service: Pulmonary;;  . FOOT SURGERY Bilateral   . HYSTEROTOMY    . LAMINECTOMY  2006  . ROTATOR CUFF REPAIR Bilateral   . TONSILLECTOMY     age 58  . TOTAL HIP ARTHROPLASTY Right 01/18/2015    Procedure: RIGHT TOTAL HIP ARTHROPLASTY ANTERIOR APPROACH;  Surgeon: Renette Butters, MD;  Location: [facility] [place];  Service: Orthopedics;  Laterality: Right;  . TUBAL LIGATION    . VIDEO BRONCHOSCOPY WITH ENDOBRONCHIAL ULTRASOUND N/A 12/22/2019   Procedure: VIDEO BRONCHOSCOPY WITH ENDOBRONCHIAL ULTRASOUND;  Surgeon: Collene Gobble, MD;  Location[facility];  Service: Pulmonary;  Laterality: N/A;     reports that she quit smoking about 8 years ago. Her smoking use included cigarettes. She has a 10.00 pack-year smoking history. She has never used smokeless tobacco. She reports current alcohol use of about 5.0 standard drinks of alcohol per week. She reports that she does not use drugs.  Allergies  Allergen Reactions  . Zostavax [Zoster Vaccine Live] Rash  . Naproxen Hives  . Penicillins Hives    Did it involve swelling of the face/tongue/throat, SOB, or low BP? No Did it involve sudden or severe rash/hives, skin peeling, or any reaction on the inside of your mouth or nose? Yes Did you need to seek medical attention at a hospital or doctor's office? Yes When did it last happen?14 or 15 If all above answers are "NO", may proceed with cephalosporin use.   . Zoloft [Sertraline Hcl] Hives  . Latex Itching and Rash    Condoms    Family History  Problem Relation Age of Onset  . Alcohol abuse Mother   . Cancer Mother 89       lung  . Migraines Mother   . Cancer Father 62       prostate  . Diabetes Father   . Alcohol abuse Brother   . Cancer Brother        skin  . Cancer Daughter        breast  . Breast cancer Daughter 33  . Bladder Cancer Neg Hx   . Kidney cancer Neg Hx     Prior to Admission medications   Medication Sig Start Date End Date Taking? Authorizing Provider  ALPRAZolam Duanne Moron) 0.5 MG tablet Take 3 tablets (1.5 mg total) by mouth at bedtime. 12/22/19   Collene Gobble, MD  calcium-vitamin D (OSCAL WITH D) 500-200 MG-UNIT tablet Take 1 tablet by mouth daily.     [provider]  clindamycin (CLEOCIN) 150 MG capsule Take 450 mg by mouth See admin instructions. Take before dental procedures     [provider]  famotidine (PEPCID) 20 MG tablet One after supper 04/18/20   Tanda Rockers, MD  ivabradine (CORLANOR) 5 MG TABS tablet Take 2 hours prior to cardiac CT along with metoprolol (100mg  tablet) 05/12/20   Kate Sable, MD  metoprolol tartrate (LOPRESSOR) 100 MG tablet Take 1 tablet (100 mg total) by mouth once for 1 dose. Take 2 hours prior to your CT scan. 05/12/20 05/12/20  Kate Sable, MD  Multiple Vitamin (MULTIVITAMIN WITH MINERALS) TABS tablet Take 1 tablet by mouth daily.    [provider]  Multiple Vitamins-Minerals (MULTIVITAMIN WITH MINERALS) tablet Take  1 tablet by mouth 2 (two) times daily.     [provider]  nitroGLYCERIN (NITROSTAT) 0.4 MG SL tablet Place 1 tablet (0.4 mg total) under the tongue every 5 (five) minutes as needed for chest pain. Go to ER if third tablet is necessary 05/03/20   Birdie Sons, MD  ondansetron (ZOFRAN) 4 MG tablet Take 1 tablet (4 mg total) by mouth every 8 (eight) hours as needed for nausea or vomiting. 08/31/19   Lequita Asal, MD  pantoprazole (PROTONIX) 40 MG tablet Take 1 tablet (40 mg total) by mouth daily. Take 30-60 min before first meal of the day 04/18/20   Tanda Rockers, MD  pramipexole (MIRAPEX) 1 MG tablet TAKE 1 TABLET BY MOUTH AT BEDTIME 12/29/19   Birdie Sons, MD  prochlorperazine (COMPAZINE) 10 MG tablet Take 1 tablet (10 mg total) by mouth every 6 (six) hours as needed for nausea or vomiting. 01/11/20   Curt Bears, MD  sulfamethoxazole-trimethoprim (BACTRIM DS) 800-160 MG tablet Take 1 tablet by mouth 2 (two) times daily. 05/03/20   Birdie Sons, MD  tamoxifen (NOLVADEX) 20 MG tablet Take 1 tablet (20 mg total) by mouth daily. 08/31/19   Lequita Asal, MD  valACYclovir (VALTREX) 500 MG tablet Take 1 tablet (500 mg total) by  mouth daily as needed (Cold sores). 12/22/19   Collene Gobble, MD    Physical Exam: Vitals:   05/12/20 1641  BP: 121/73  Pulse: 81  Resp: (!) 24  Temp: 98.9 F (37.2 C)  TempSrc: Oral  SpO2: 92%  Weight: 78.9 kg  Height: 5\' 2"  (1.575 m)    Constitutional: NAD, calm, comfortable Vitals:   05/12/20 1641  BP: 121/73  Pulse: 81  Resp: (!) 24  Temp: 98.9 F (37.2 C)  TempSrc: Oral  SpO2: 92%  Weight: 78.9 kg  Height: 5\' 2"  (1.575 m)   Eyes: PERRL, lids and conjunctivae normal ENMT: Mucous membranes are moist. Posterior pharynx clear of any exudate or lesions.Normal dentition.  Neck: normal, supple, no masses, no thyromegaly Respiratory: Bilateral diffuse fine crackles worse at both bases. Cardiovascular: Regular rate and rhythm, no murmurs / rubs / gallops. No extremity edema. 2+ pedal pulses. No carotid bruits.  Abdomen: no tenderness, no masses palpated. No hepatosplenomegaly. Bowel sounds positive.  Musculoskeletal: no clubbing / cyanosis. No joint deformity upper and lower extremities. Good ROM, no contractures. Normal muscle tone.  Skin: no rashes, lesions, ulcers. No induration Neurologic: CN 2-12 grossly intact.  Patient is moving all extremities. psychiatric: Normal judgment and insight. Alert and oriented x 3. Normal mood.    Labs on Admission: I have personally reviewed following labs and imaging studies  CBC: Recent Labs  Lab 05/12/20 1659  WBC 8.7  NEUTROABS 6.7  HGB 12.8  HCT 39.0  MCV 90.3  PLT 638   Basic Metabolic Panel: Recent Labs  Lab 05/12/20 1659  NA 138  K 4.1  CL 108  CO2 21*  GLUCOSE 119*  BUN 16  CREATININE 0.53  CALCIUM 8.4*   GFR: Estimated Creatinine Clearance: 66.4 mL/min (by C-G formula based on SCr of 0.53 mg/dL). Liver Function Tests: Recent Labs  Lab 05/12/20 1659  AST 25  ALT 35  ALKPHOS 39  BILITOT 0.8  PROT 6.7  ALBUMIN 3.6   Urine analysis:    Component Value Date/Time   COLORURINE YELLOW 01/07/2015  1221   APPEARANCEUR Clear 02/11/2017 0933   LABSPEC 1.022 01/07/2015 1221   PHURINE 5.5  01/07/2015 1221   GLUCOSEU Negative 02/11/2017 0933   HGBUR NEGATIVE 01/07/2015 1221   BILIRUBINUR negative 03/28/2020 1507   BILIRUBINUR Negative 02/11/2017 0933   KETONESUR NEGATIVE 01/07/2015 1221   PROTEINUR Negative 03/28/2020 1507   PROTEINUR Negative 02/11/2017 0933   PROTEINUR NEGATIVE 01/07/2015 1221   UROBILINOGEN 0.2 03/28/2020 1507   UROBILINOGEN 1.0 01/07/2015 1221   NITRITE negative 03/28/2020 1507   NITRITE Negative 02/11/2017 0933   NITRITE NEGATIVE 01/07/2015 1221   LEUKOCYTESUR Negative 03/28/2020 1507   LEUKOCYTESUR Negative 02/11/2017 0933    Radiological Exams on Admission: DG Chest 2 View  Result Date: 05/12/2020 CLINICAL DATA:  Shortness of breath EXAM: CHEST - 2 VIEW COMPARISON:  Radiograph 04/18/2020, CT 02/23/2020 FINDINGS: There is increasing diffuse hazy interstitial opacities with peribronchovascular cuffing, fissural and septal thickening and a new moderate left pleural effusion tracking along the lateral left lung. No right effusion. No pneumothorax. The aorta is calcified. The remaining cardiomediastinal contours are unremarkable. Cardiac silhouette partially obscured by overlying opacity though appears grossly similar to comparison exam. Dextrocurvature of the midthoracic spine, similar to prior. Prior surgical repair of the right rotator cuff with anchor in the proximal humerus. No acute osseous or soft tissue abnormality. IMPRESSION: Worsening interstitial opacities, favor worsening edema with new moderate left pleural effusion. Adjacent opacity likely reflect some passive atelectasis and more diffuse edematous change or underlying consolidation is difficult to exclude. Electronically Signed   By: Lovena Le M.D.   On: 05/12/2020 17:31    EKG: Independently reviewed.  Normal sinus rhythm at 89.  Assessment/Plan Principal Problem:   Respiratory failure with hypoxia  (HCC) Active Problems:   Mediastinal adenopathy   Adenocarcinoma of right lung, stage 3 (HCC)   Major depressive disorder, single episode   DOE (dyspnea on exertion)   Problem Assessment/Plan Cardiovascular and Mediastinum Mediastinal adenopathy Overview R breast ca dx July 2020 = T: 1cN: 0 Group: Stage 1 A invasive Mammory Carcinoma of > radical lumpectomy RT  And Tamoxifen   - tumor markers trending up 11/2019 -PET 12/07/19  1. Hypermetabolic paratracheal, right hilar, and subcarinal adenopathy, suspicious for active malignancy.  Respiratory Adenocarcinoma of right lung, stage 3 [facility] Assessment & Plan Patient is currently receiving immunotherapy with oncologist Dr. Julien Nordmann.  * Respiratory failure with hypoxia [facility]) Overview Patient is a 69 year old Caucasian female with past medical history of tobacco abuse and lung cancer currently receiving chemotherapy, presenting to the emergency room with progressive shortness of breath and hypoxia.  Review shows that patient is currently under work-up for dyspnea on exertion and has an echocardiogram pending.  However I suspect this is related to her moderate sized pleural effusion in the left side.  We will proceed with CT imaging for further detailed evaluation patient also has a history of spinal adenopathy which was attributed to her history of breast cancer that is being also followed by oncology. We will start appropriate and indicated therapy once we have results of CTA of the chest. Discussed with patient that we will consider thoracentesis if indicated. We will give patient 1 dose of diuretic therapy.  Other Major depressive disorder, single episode Assessment & Plan Continue patient's home regimen.  DVT prophylaxis: SCDs. Code Status: Full code.   Family Communication: [place]  [phone] [ home]/ [phone] [ mobile ] Disposition Plan: Home.   Consults called:  None  Admission status: Inpatient  Para Skeans MD [facility] Pager [phone] If 7PM-7AM, please contact night-coverage [email or web address] Password [facility] 05/12/2020, 6:17 PM

## 2020-05-12 NOTE — ED Notes (Signed)
Admitting Dr at bedside

## 2020-05-12 NOTE — ED Notes (Signed)
Pt assisted to toilet in room, ambulatory with steady gait. Pt requesting food, pt given [place] sandwich tray, graham cracker, peanut butter, water at this time.

## 2020-05-12 NOTE — ED Notes (Signed)
Pt placed on 2L 

## 2020-05-12 NOTE — ED Provider Notes (Signed)
[facility] Emergency Department Provider Note   ____________________________________________   First MD Initiated Contact with Patient 05/12/20 1717     (approximate)  I have reviewed the triage vital signs and the nursing notes.   HISTORY  Chief Complaint Shortness of Breath    HPI Elizabeth [name] is a 69 y.o. female mild history of lung cancer on chemotherapy.  Patient presents for increasing shortness of breath worsening over about a week's time.  She reports that she has been communicating with her doctor, saw cardiology, was supposed to have a stress test done today but was unable to have that completed due to shortness of breath and elevated heart rate  She has been feeling progressive but slowly worsening shortness of breath  No fevers or chills.  No cough.  Denies Covid exposure.  No runny nose.  Follows with her oncologist at [facility].  Feels improved with oxygen treatment   Past Medical History:  Diagnosis Date  . Anginal pain ([place])   . Anxiety   . Breast cancer, right ([place]) 04/2019   breast cancer (radical lumpectomy and radiation  . Complication of anesthesia    one time woke when she had a tube down throat and had a panic attack  . Constipation due to opioid therapy   . Degenerative joint disease (DJD) of hip   . GERD (gastroesophageal reflux disease)   . Headache    migraines when she was working  . Osteopenia   . Pneumonia   . Restless leg syndrome    takes Mirapex and xanax  . Scoliosis   . SUI (stress urinary incontinence, female)   . Vaso-vagal reaction    after back surgery    Patient Active Problem List   Diagnosis Date Noted  . DOE (dyspnea on exertion) 04/18/2020  . Encounter for antineoplastic immunotherapy 03/31/2020  . Encounter for antineoplastic chemotherapy 01/12/2020  . Goals of care, counseling/discussion 01/12/2020  . Adenocarcinoma of right lung, stage 3 ([place]) 12/31/2019  . Mediastinal  adenopathy 12/09/2019  . Right lower lobe pulmonary nodule 12/09/2019  . Elevated tumor markers 12/01/2019  . Localized osteoporosis without current pathological fracture 05/04/2019  . Malignant neoplasm of upper-outer quadrant of right female breast ([place]) 05/01/2019  . Age-related osteoporosis without current pathological fracture 05/01/2019  . Diverticulosis 12/05/2018  . GERD (gastroesophageal reflux disease) 01/30/2017  . Insomnia 01/30/2017  . Anxiety 09/07/2015  . Elevated WBC count 09/07/2015  . H/O total hip arthroplasty 09/07/2015  . HLD (hyperlipidemia) 09/07/2015  . Headache, migraine 09/07/2015  . Osteoarthritis 09/07/2015  . Arthropathy of temporomandibular joint 09/07/2015  . Olecranon bursitis 09/07/2015  . Major depressive disorder, single episode 09/07/2015  . Restless leg 07/08/2015  . Primary osteoarthritis of right hip 01/19/2015  . DJD (degenerative joint disease) 01/18/2015  . Left shoulder pain 10/05/2013    Past Surgical History:  Procedure Laterality Date  . APPENDECTOMY  1963  . BACK SURGERY    . BREAST BIOPSY Right 04/27/2019   Affirm Bx "X" clip-INVASIVE MAMMARY CARCINOMA,  . BREAST CYST ASPIRATION Right 1988  . BREAST EXCISIONAL BIOPSY Right 1990   benign x 3  . BREAST LUMPECTOMY Right 05/08/2019  . BREAST LUMPECTOMY WITH SENTINEL LYMPH NODE BIOPSY Right 05/08/2019   Procedure: RIGHT BREAST LUMPECTOMY WITH SENTINEL LYMPH NODE BX AND WIRE LOC., LATEX ALLERGY;  Surgeon: Jules Husbands, MD;  Location: [facility] ORS;  Service: General;  Laterality: Right;  . BREAST SURGERY    . CATARACT EXTRACTION W/PHACO  Right 03/31/2018   Procedure: CATARACT EXTRACTION PHACO AND INTRAOCULAR LENS PLACEMENT ([place]) RIGHT;  Surgeon: Leandrew Koyanagi, MD;  Location: [place];  Service: Ophthalmology;  Laterality: Right;  Latex sensitivity  . CATARACT EXTRACTION W/PHACO Left 04/16/2018   Procedure: CATARACT EXTRACTION PHACO AND INTRAOCULAR LENS PLACEMENT ([place])  LEFT;   Surgeon: Leandrew Koyanagi, MD;  Location: [place];  Service: Ophthalmology;  Laterality: Left;  . EYE SURGERY     Lasik  . FINE NEEDLE ASPIRATION  12/22/2019   Procedure: FINE NEEDLE ASPIRATION (FNA) LINEAR;  Surgeon: Collene Gobble, MD;  Location: [place] [facility];  Service: Pulmonary;;  . FOOT SURGERY Bilateral   . HYSTEROTOMY    . LAMINECTOMY  2006  . ROTATOR CUFF REPAIR Bilateral   . TONSILLECTOMY     age 78  . TOTAL HIP ARTHROPLASTY Right 01/18/2015   Procedure: RIGHT TOTAL HIP ARTHROPLASTY ANTERIOR APPROACH;  Surgeon: Renette Butters, MD;  Location: [place];  Service: Orthopedics;  Laterality: Right;  . TUBAL LIGATION    . VIDEO BRONCHOSCOPY WITH ENDOBRONCHIAL ULTRASOUND N/A 12/22/2019   Procedure: VIDEO BRONCHOSCOPY WITH ENDOBRONCHIAL ULTRASOUND;  Surgeon: Collene Gobble, MD;  Location: [facility] ENDOSCOPY;  Service: Pulmonary;  Laterality: N/A;    Prior to Admission medications   Medication Sig Start Date End Date Taking? Authorizing Provider  ALPRAZolam Duanne Moron) 0.5 MG tablet Take 3 tablets (1.5 mg total) by mouth at bedtime. 12/22/19   Collene Gobble, MD  calcium-vitamin D (OSCAL WITH D) 500-200 MG-UNIT tablet Take 1 tablet by mouth daily.    [provider]  clindamycin (CLEOCIN) 150 MG capsule Take 450 mg by mouth See admin instructions. Take before dental procedures     [provider]  famotidine (PEPCID) 20 MG tablet One after supper 04/18/20   Tanda Rockers, MD  ivabradine (CORLANOR) 5 MG TABS tablet Take 2 hours prior to cardiac CT along with metoprolol (100mg  tablet) 05/12/20   Kate Sable, MD  metoprolol tartrate (LOPRESSOR) 100 MG tablet Take 1 tablet (100 mg total) by mouth once for 1 dose. Take 2 hours prior to your CT scan. 05/12/20 05/12/20  Kate Sable, MD  Multiple Vitamin (MULTIVITAMIN WITH MINERALS) TABS tablet Take 1 tablet by mouth daily.    [provider]  Multiple Vitamins-Minerals (MULTIVITAMIN WITH MINERALS) tablet  Take 1 tablet by mouth 2 (two) times daily.     [provider]  nitroGLYCERIN (NITROSTAT) 0.4 MG SL tablet Place 1 tablet (0.4 mg total) under the tongue every 5 (five) minutes as needed for chest pain. Go to ER if third tablet is necessary 05/03/20   Birdie Sons, MD  ondansetron (ZOFRAN) 4 MG tablet Take 1 tablet (4 mg total) by mouth every 8 (eight) hours as needed for nausea or vomiting. 08/31/19   Lequita Asal, MD  pantoprazole (PROTONIX) 40 MG tablet Take 1 tablet (40 mg total) by mouth daily. Take 30-60 min before first meal of the day 04/18/20   Tanda Rockers, MD  pramipexole (MIRAPEX) 1 MG tablet TAKE 1 TABLET BY MOUTH AT BEDTIME 12/29/19   Birdie Sons, MD  prochlorperazine (COMPAZINE) 10 MG tablet Take 1 tablet (10 mg total) by mouth every 6 (six) hours as needed for nausea or vomiting. 01/11/20   Curt Bears, MD  sulfamethoxazole-trimethoprim (BACTRIM DS) 800-160 MG tablet Take 1 tablet by mouth 2 (two) times daily. 05/03/20   Birdie Sons, MD  tamoxifen (NOLVADEX) 20 MG tablet Take 1 tablet (20  mg total) by mouth daily. 08/31/19   Lequita Asal, MD  valACYclovir (VALTREX) 500 MG tablet Take 1 tablet (500 mg total) by mouth daily as needed (Cold sores). 12/22/19   Collene Gobble, MD    Allergies Zostavax [zoster vaccine live], Naproxen, Penicillins, Zoloft [sertraline hcl], and Latex  Family History  Problem Relation Age of Onset  . Alcohol abuse Mother   . Cancer Mother 59       lung  . Migraines Mother   . Cancer Father 91       prostate  . Diabetes Father   . Alcohol abuse Brother   . Cancer Brother        skin  . Cancer Daughter        breast  . Breast cancer Daughter 29  . Bladder Cancer Neg Hx   . Kidney cancer Neg Hx     Social History Social History   Tobacco Use  . Smoking status: Former Smoker    Packs/day: 0.50    Years: 20.00    Pack years: 10.00    Types: Cigarettes    Quit date: 01/07/2012    Years since quitting:  8.3  . Smokeless tobacco: Never Used  Vaping Use  . Vaping Use: Never used  Substance Use Topics  . Alcohol use: Yes    Alcohol/week: 5.0 standard drinks    Types: 5 Glasses of wine per week    Comment: red wine  . Drug use: No    Review of Systems Constitutional: No fever/chills Eyes: No visual changes. ENT: No sore throat. Cardiovascular: Denies chest pain. Respiratory: See HPI Gastrointestinal: No abdominal pain.   Genitourinary: Negative for dysuria. Musculoskeletal: Negative for back pain.  Has occasionally noticed some swelling in feet and ankles Skin: Negative for rash. Neurological: Negative for headaches, areas of focal weakness or numbness.    ____________________________________________   PHYSICAL EXAM:  VITAL SIGNS: ED Triage Vitals [05/12/20 1641]  Enc Vitals Group     BP 121/73     Pulse Rate 81     Resp (!) 24     Temp 98.9 F (37.2 C)     Temp Source Oral     SpO2 92 %     Weight 174 lb (78.9 kg)     Height 5\' 2"  (5.852 m)     Head Circumference      Peak Flow      Pain Score 0     Pain Loc      Pain Edu?      Excl. in [place]?     Constitutional: Alert and oriented. Well appearing and in no acute distress.  Resting quite comfortably on oxygen at this time by nasal cannula. Eyes: Conjunctivae are normal. Head: Atraumatic. Nose: No congestion/rhinnorhea. Mouth/Throat: Mucous membranes are moist. Neck: No stridor.  Cardiovascular: Normal rate, regular rhythm. Grossly normal heart sounds.  Good peripheral circulation. Respiratory: Normal respiratory effort.  No retractions. Lungs CTAB except over the left side where she has diminished lung sounds over the base. Gastrointestinal: Soft and nontender. No distention. Musculoskeletal: No lower extremity tenderness nor edema. Neurologic:  Normal speech and language. No gross focal neurologic deficits are appreciated.  Skin:  Skin is warm, dry and intact. No rash noted. Psychiatric: Mood and affect are  normal. Speech and behavior are normal.  ____________________________________________   LABS (all labs ordered are listed, but only abnormal results are displayed)  Labs Reviewed  COMPREHENSIVE METABOLIC PANEL - Abnormal; Notable for  the following components:      Result Value   CO2 21 (*)    Glucose, Bld 119 (*)    Calcium 8.4 (*)    All other components within normal limits  SARS CORONAVIRUS 2 BY RT PCR (HOSPITAL ORDER, [place] [facility])  LACTIC ACID, PLASMA  CBC WITH DIFFERENTIAL/PLATELET  LACTIC ACID, PLASMA  URINALYSIS, COMPLETE (UACMP) WITH MICROSCOPIC  BRAIN NATRIURETIC PEPTIDE   ____________________________________________  EKG  Reviewed inter by me at 1659 heart rate 89 QRS 72 [place] QTc 450 Normal sinus rhythm no evidence of ischemia or ectopy ____________________________________________  RADIOLOGY  DG Chest 2 View  Result Date: 05/12/2020 CLINICAL DATA:  Shortness of breath EXAM: CHEST - 2 VIEW COMPARISON:  Radiograph 04/18/2020, CT 02/23/2020 FINDINGS: There is increasing diffuse hazy interstitial opacities with peribronchovascular cuffing, fissural and septal thickening and a new moderate left pleural effusion tracking along the lateral left lung. No right effusion. No pneumothorax. The aorta is calcified. The remaining cardiomediastinal contours are unremarkable. Cardiac silhouette partially obscured by overlying opacity though appears grossly similar to comparison exam. Dextrocurvature of the midthoracic spine, similar to prior. Prior surgical repair of the right rotator cuff with anchor in the proximal humerus. No acute osseous or soft tissue abnormality. IMPRESSION: Worsening interstitial opacities, favor worsening edema with new moderate left pleural effusion. Adjacent opacity likely reflect some passive atelectasis and more diffuse edematous change or underlying consolidation is difficult to exclude. Electronically Signed   By: Lovena Le M.D.    On: 05/12/2020 17:31    Imaging reviewed notable for new moderate left pleural effusion.  Cannot exclude underlying consolidation.  Also noted by radiologist are worsening interstitial opacities, favor worsening edema ____________________________________________   PROCEDURES  Procedure(s) performed: None  Procedures  Critical Care performed: No  ____________________________________________   INITIAL IMPRESSION / ASSESSMENT AND PLAN / ED COURSE  Pertinent labs & imaging results that were available during my care of the patient were reviewed by me and considered in my medical decision making (see chart for details).   Patient notes for increasing dyspnea.  Found to have new left-sided pleural effusion.  No distress once placed on oxygen by nasal cannula, resting quite comfortably with improvement in oxygen saturation.  No associated chest pain.  EKG reassuring without evidence ischemia.  There is concern for possible increasing edematous-like findings on her imaging as well.  Await BNP, consider potential cardiac, malignant, or felt far less likely infectious etiologies etc.  Discussed with hospitalist, will be admitted for further work-up and treatment    Elizabeth [name] was evaluated in Emergency Department on 05/12/2020 for the symptoms described in the history of present illness. She was evaluated in the context of the global COVID-19 pandemic, which necessitated consideration that the patient might be at risk for infection with the SARS-CoV-2 virus that causes COVID-19. Institutional protocols and algorithms that pertain to the evaluation of patients at risk for COVID-19 are in a state of rapid change based on information released by regulatory bodies including the CDC and federal and state organizations. These policies and algorithms were followed during the patient's care in the ED.   Patient reports fully vaccinated against  Covid  ____________________________________________   FINAL CLINICAL IMPRESSION(S) / ED DIAGNOSES  Final diagnoses:  Hypoxia  Pleural effusion on left        Note:  This document was prepared using Dragon voice recognition software and may include unintentional dictation errors       Delman Kitten,  MD 05/12/20 1744

## 2020-05-12 NOTE — Assessment & Plan Note (Signed)
Patient is currently receiving immunotherapy with oncologist Dr. Julien Nordmann.

## 2020-05-12 NOTE — ED Notes (Signed)
Pt up to use bathroom 

## 2020-05-12 NOTE — Telephone Encounter (Signed)
She will need evaluation to rule out immunotherapy mediated pneumonitis.

## 2020-05-12 NOTE — ED Notes (Signed)
1 set of blood cultures, grey, lavender, blue and light green tubes sent to lab

## 2020-05-13 ENCOUNTER — Inpatient Hospital Stay (HOSPITAL_COMMUNITY)
Admit: 2020-05-13 | Discharge: 2020-05-13 | Disposition: A | Discharge status: Home / Self Care | Payer: Medicare Other | Attending: Internal Medicine | Admitting: Internal Medicine

## 2020-05-13 ENCOUNTER — Encounter: Payer: Self-pay | Admitting: Internal Medicine

## 2020-05-13 ENCOUNTER — Inpatient Hospital Stay: Payer: Medicare Other

## 2020-05-13 DIAGNOSIS — R0602 Shortness of breath: Secondary | ICD-10-CM

## 2020-05-13 DIAGNOSIS — J9621 Acute and chronic respiratory failure with hypoxia: Secondary | ICD-10-CM

## 2020-05-13 LAB — HEMOGLOBIN A1C
Hgb A1c MFr Bld: 5.2 % (ref 4.8–5.6)
Mean Plasma Glucose: 102.54 mg/dL

## 2020-05-13 LAB — ECHOCARDIOGRAM COMPLETE
Height: 62 in
S' Lateral: 1.98 cm
Weight: 2784 oz

## 2020-05-13 LAB — PROTIME-INR
INR: 1 (ref 0.8–1.2)
Prothrombin Time: 12.8 seconds (ref 11.4–15.2)

## 2020-05-13 LAB — PROTEIN, PLEURAL OR PERITONEAL FLUID: Total protein, fluid: 4.5 g/dL

## 2020-05-13 LAB — BODY FLUID CELL COUNT WITH DIFFERENTIAL
Eos, Fluid: 4 %
Lymphs, Fluid: 32 %
Monocyte-Macrophage-Serous Fluid: 61 %
Neutrophil Count, Fluid: 3 %
Total Nucleated Cell Count, Fluid: 2566 cu mm

## 2020-05-13 LAB — APTT: aPTT: 76 seconds — ABNORMAL HIGH (ref 24–36)

## 2020-05-13 LAB — CBC
HCT: 37.8 % (ref 36.0–46.0)
Hemoglobin: 12.5 g/dL (ref 12.0–15.0)
MCH: 29.6 pg (ref 26.0–34.0)
MCHC: 33.1 g/dL (ref 30.0–36.0)
MCV: 89.4 fL (ref 80.0–100.0)
Platelets: 237 10*3/uL (ref 150–400)
RBC: 4.23 MIL/uL (ref 3.87–5.11)
RDW: 13.1 % (ref 11.5–15.5)
WBC: 8.1 10*3/uL (ref 4.0–10.5)
nRBC: 0 % (ref 0.0–0.2)

## 2020-05-13 LAB — COMPREHENSIVE METABOLIC PANEL
ALT: 31 U/L (ref 0–44)
AST: 21 U/L (ref 15–41)
Albumin: 3.4 g/dL — ABNORMAL LOW (ref 3.5–5.0)
Alkaline Phosphatase: 38 U/L (ref 38–126)
Anion gap: 8 (ref 5–15)
BUN: 13 mg/dL (ref 8–23)
CO2: 23 mmol/L (ref 22–32)
Calcium: 8.2 mg/dL — ABNORMAL LOW (ref 8.9–10.3)
Chloride: 107 mmol/L (ref 98–111)
Creatinine, Ser: 0.58 mg/dL (ref 0.44–1.00)
GFR calc Af Amer: >60 mL/min (ref >60)
GFR calc non Af Amer: >60 mL/min (ref >60)
Glucose, Bld: 101 mg/dL — ABNORMAL HIGH (ref 70–99)
Potassium: 4 mmol/L (ref 3.5–5.1)
Sodium: 138 mmol/L (ref 135–145)
Total Bilirubin: 0.7 mg/dL (ref 0.3–1.2)
Total Protein: 6.4 g/dL — ABNORMAL LOW (ref 6.5–8.1)

## 2020-05-13 LAB — HIV ANTIBODY (ROUTINE TESTING W REFLEX): HIV Screen 4th Generation wRfx: NONREACTIVE

## 2020-05-13 LAB — ALBUMIN, PLEURAL OR PERITONEAL FLUID: Albumin, Fluid: 2.7 g/dL

## 2020-05-13 LAB — TROPONIN I (HIGH SENSITIVITY)
Troponin I (High Sensitivity): <2 ng/L (ref <18)
Troponin I (High Sensitivity): <2 ng/L (ref <18)

## 2020-05-13 LAB — HEPARIN LEVEL (UNFRACTIONATED)
Heparin Unfractionated: 0.5 IU/mL (ref 0.30–0.70)
Heparin Unfractionated: <0.1 IU/mL — ABNORMAL LOW (ref 0.30–0.70)

## 2020-05-13 LAB — GLUCOSE, PLEURAL OR PERITONEAL FLUID: Glucose, Fluid: 79 mg/dL

## 2020-05-13 LAB — AMYLASE, PLEURAL OR PERITONEAL FLUID: Amylase, Fluid: 62 U/L

## 2020-05-13 LAB — LACTATE DEHYDROGENASE, PLEURAL OR PERITONEAL FLUID: LD, Fluid: 670 U/L — ABNORMAL HIGH (ref 3–23)

## 2020-05-13 MED ORDER — ALPRAZOLAM 1 MG PO TABS
1.5000 mg | ORAL_TABLET | Freq: Three times a day (TID) | ORAL | Status: DC | PRN
  Administered 2020-05-13 – 2020-05-15 (×3): 1.5 mg via ORAL
  Filled 2020-05-13 (×3): qty 1

## 2020-05-13 MED ORDER — OXYCODONE HCL 5 MG PO TABS
5.0000 mg | ORAL_TABLET | Freq: Four times a day (QID) | ORAL | Status: DC | PRN
  Administered 2020-05-13 – 2020-05-15 (×6): 5 mg via ORAL
  Filled 2020-05-13 (×6): qty 1

## 2020-05-13 MED ORDER — LORAZEPAM 2 MG/ML IJ SOLN
1.0000 mg | Freq: Once | INTRAMUSCULAR | Status: AC
  Administered 2020-05-13: 15:00:00 1 mg via INTRAVENOUS
  Filled 2020-05-13: qty 1

## 2020-05-13 NOTE — Progress Notes (Signed)
ANTICOAGULATION CONSULT NOTE - Initial Consult  Pharmacy Consult for Heparin Indication: pulmonary embolus  Allergies  Allergen Reactions  . Zostavax [Zoster Vaccine Live] Rash  . Penicillins Hives    Did it involve swelling of the face/tongue/throat, SOB, or low BP? No Did it involve sudden or severe rash/hives, skin peeling, or any reaction on the inside of your mouth or nose? Yes Did you need to seek medical attention at a hospital or doctor's office? Yes When did it last happen?14 or 15 If all above answers are "NO", may proceed with cephalosporin use.   . Naproxen Hives  . Zoloft [Sertraline Hcl] Hives  . Latex Itching and Rash    Condoms   Patient Measurements: Height: 5\' 2"  (157.5 cm) Weight: 78.9 kg (174 lb) IBW/kg (Calculated) : 50.1 HEPARIN DW (KG): 67.5  Vital Signs: Temp: 98.3 F (36.8 C) (08/06 0749) Temp Source: Oral (08/06 0749) BP: 115/54 (08/06 0749) Pulse Rate: 84 (08/06 0749)  Labs: Recent Labs    05/12/20 1659 05/12/20 2245 05/13/20 0434  HGB 12.8  --  12.5  HCT 39.0  --  37.8  PLT 258  --  237  APTT  --  27 76*  LABPROT  --  13.2 12.8  INR  --  1.0 1.0  HEPARINUNFRC  --   --  0.50  CREATININE 0.53  --  0.58    Estimated Creatinine Clearance: 66.4 mL/min (by C-G formula based on SCr of 0.58 mg/dL).   Medical History: Past Medical History:  Diagnosis Date  . Anginal pain ([place])   . Anxiety   . Breast cancer, right ([place]) 04/2019   breast cancer (radical lumpectomy and radiation  . Complication of anesthesia    one time woke when she had a tube down throat and had a panic attack  . Constipation due to opioid therapy   . Degenerative joint disease (DJD) of hip   . GERD (gastroesophageal reflux disease)   . Headache    migraines when she was working  . Osteopenia   . Pneumonia   . Restless leg syndrome    takes Mirapex and xanax  . Scoliosis   . SUI (stress urinary incontinence, female)   . Vaso-vagal reaction    after back  surgery    Medications:  Medications Prior to Admission  Medication Sig Dispense Refill Last Dose  . ALPRAZolam (XANAX) 0.5 MG tablet Take 3 tablets (1.5 mg total) by mouth at bedtime.   05/11/2020 at 2000  . calcium-vitamin D (OSCAL WITH D) 500-200 MG-UNIT tablet Take 1 tablet by mouth daily.   05/12/2020 at 0800  . famotidine (PEPCID) 20 MG tablet One after supper (Patient taking differently: Take 20 mg by mouth daily. One after supper) 30 tablet 11 05/11/2020 at 1800  . Multiple Vitamin (MULTIVITAMIN WITH MINERALS) TABS tablet Take 1 tablet by mouth daily.   05/12/2020 at 0800  . pantoprazole (PROTONIX) 40 MG tablet Take 1 tablet (40 mg total) by mouth daily. Take 30-60 min before first meal of the day 30 tablet 2 05/12/2020 at 0730  . pramipexole (MIRAPEX) 1 MG tablet TAKE 1 TABLET BY MOUTH AT BEDTIME (Patient taking differently: Take 1 mg by mouth at bedtime. ) 30 tablet 12 05/11/2020 at 2000  . sulfamethoxazole-trimethoprim (BACTRIM DS) 800-160 MG tablet Take 1 tablet by mouth 2 (two) times daily. 14 tablet 0 05/11/2020 at 0800  . tamoxifen (NOLVADEX) 20 MG tablet Take 1 tablet (20 mg total) by mouth daily. 90 tablet 3  05/12/2020 at 0800  . clindamycin (CLEOCIN) 150 MG capsule Take 450 mg by mouth See admin instructions. Take before dental procedures    prn at prn  . ivabradine (CORLANOR) 5 MG TABS tablet Take 2 hours prior to cardiac CT along with metoprolol (100mg  tablet) 2 tablet 0   . metoprolol tartrate (LOPRESSOR) 100 MG tablet Take 1 tablet (100 mg total) by mouth once for 1 dose. Take 2 hours prior to your CT scan. 1 tablet 0   . nitroGLYCERIN (NITROSTAT) 0.4 MG SL tablet Place 1 tablet (0.4 mg total) under the tongue every 5 (five) minutes as needed for chest pain. Go to ER if third tablet is necessary 30 tablet 5 prn at prn  . ondansetron (ZOFRAN) 4 MG tablet Take 1 tablet (4 mg total) by mouth every 8 (eight) hours as needed for nausea or vomiting. (Patient not taking: Reported on 05/12/2020) 20  tablet 0 Completed Course at Unknown time  . prochlorperazine (COMPAZINE) 10 MG tablet Take 1 tablet (10 mg total) by mouth every 6 (six) hours as needed for nausea or vomiting. (Patient not taking: Reported on 05/12/2020) 30 tablet 0 Not Taking at Unknown time  . valACYclovir (VALTREX) 500 MG tablet Take 1 tablet (500 mg total) by mouth daily as needed (Cold sores).   prn at prn    Assessment: Pharmacy consulted for heparin infusion dosing and monitoring for 68 yo female for treatment new PE.  Patient has hx of nonsmall cell lung cancer and right-sided breast cancer. No anticoagulants per PTA med list.    8/5 Heparin infusion was initiated @ 1000 units/hr.   Goal of Therapy:  Heparin level 0.3-0.7 units/ml Monitor platelets by anticoagulation protocol: Yes   Plan:  8/6 @ 0434 HL: 0.50. Level is therapeutic x 1.Hgb/Plts remain stable.  Will continue current infusion rate of 1000 units/hr.  Will recheck confirmatory HL in 6 hours.  Continue to monitor CBCs daily per protocol while on heparin infusion.   Pernell Dupre, PharmD, BCPS Clinical Pharmacist 05/13/2020 10:18 AM

## 2020-05-13 NOTE — Progress Notes (Addendum)
PROGRESS NOTE    Elizabeth [name]  LKG:[id number] DOB: 1951/07/07 DOA: 05/12/2020 PCP: Birdie Sons, MD    Brief Narrative:  HPI: Elizabeth [name] is a 69 y.o. female with medical history significant of intermittent shortness of breath with dyspnea of exertion and orthopnea since middle of July of this year.  Patient has been under evaluation for her shortness of breath with her oncologist and an echocardiogram is scheduled.  When patient noticed that she is having worsening shortness of breath with shorter distances she relayed this information to the nurse and was advised to come to the emergency room.  Oncologist note reviewed with recommendations for evaluation of immunotherapy mediated pneumonitis.  Discussed with patient that her shortness of breath is most likely due to the moderate-sized pleural effusion found on chest x-ray however would like to pursue CT scan of her chest with contrast to identify any pulmonary embolism but also for any malignancy, as she is at risk for malignancies patient has a history of breast and lung cancer.  Also at bedside both agree with plan of care all questions answered review of systems is negative for any other symptoms.  ED Course:  In the emergency room patient is alert awake and oriented on 2 L of nasal cannula with O2 sats above 90% patient is comfortable not tachypneic and not tachycardic.  She is giving history. Labs are stable.  An EKG done is sinus rhythm with a heart rate of 89.  Chest x-ray shows worsening interstitial opacities and a new moderate left pleural effusion.   Assessment & Plan:   Principal Problem:   Respiratory failure with hypoxia (HCC) Active Problems:   Mediastinal adenopathy   Adenocarcinoma of right lung, stage 3 (HCC)   Major depressive disorder, single episode   DOE (dyspnea on exertion)  Problem Assessment/Plan Respiratory failure with hypoxia [facility]) Overview Patient is a 69 year old Caucasian  female with past medical history of tobacco abuse and lung cancer currently receiving chemotherapy, presenting to the emergency room with progressive shortness of breath and hypoxia.  Review shows that patient is currently under work-up for dyspnea on exertion and has an echocardiogram pending.  However I suspect this is related to her moderate sized pleural effusion in the left side.  We will proceed with CT imaging for further detailed evaluation patient also has a history of spinal adenopathy which was attributed to her history of breast cancer that is being also followed by oncology. We will start appropriate and indicated therapy once we have results of CTA of the chest. Discussed with patient that we will consider thoracentesis if indicated. We will give patient 1 dose of diuretic therapy.  CTA chest showed RLL PE and after d/w Dr.Shick radiologist it is very tiny, therefore we will not Treat. We will proceed with thoracentesis. Pt anxious and we will get valium prior to her procedure.  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 Hypermetabolic paratracheal, right hilar, and subcarinal adenopathy, suspicious for active malignancy.   Adenocarcinoma of right lung, stage 3 [facility]) Assessment & Plan Patient is currently receiving immunotherapy with oncologist Dr. Julien Nordmann.   Major depressive disorder, single episode Continue patient's home regimen.   DVT prophylaxis: SCD's Code Status: FULL Code Family Communication:  Spouse . Disposition Plan: Home with home PT.   Consultants:  Cardiology. IR.  Procedures: Left lung thoracentesis.   Subjective: Pt seen today and is feeling sob and is on oxygen 2LNC and is able to answer questions. D/W her about plan for today, she is found to have have very small insignificant  pe and  We do not need to continued with blood thinners. D/w her about thoracentesis . Overnight pt was given ivf for hydration and aki but still did not feel better, states her hr was 140 at night and she was having chest pain or palpitation.   Objective: Vitals:   05/13/20 0542 05/13/20 0749 05/13/20 0836 05/13/20 1211  BP: (!) 114/56 (!) 115/54  (!) 110/57  Pulse: 87 84  93  Resp: 17 16  16   Temp: 98 F (36.7 C) 98.3 F (36.8 C)  97.6 F (36.4 C)  TempSrc: Oral Oral  Oral  SpO2: 97% 98% 96% 98%  Weight:      Height:        Intake/Output Summary (Last 24 hours) at 05/13/2020 1244 Last data filed at 05/13/2020 0320 Gross per 24 hour  Intake 85.08 ml  Output --  Net 85.08 ml   Filed Weights   05/12/20 1641  Weight: 78.9 kg    Examination:  General exam: Appears calm and comfortable  Respiratory system: Clear to auscultation. Respiratory effort normal. Cardiovascular system: S1 & S2 heard, RRR. No JVD, murmurs, rubs, gallops or clicks. No pedal edema. Gastrointestinal system: Abdomen is nondistended, soft and nontender. No organomegaly or masses felt. Normal bowel sounds heard. Central nervous system: Alert and oriented. No focal neurological deficits. Extremities: Symmetric 5 x 5 power. Skin: No rashes, lesions or ulcers Psychiatry: Judgement and insight appear normal. Mood & affect appropriate.   Data Reviewed: I have personally reviewed following labs and imaging studies  CBC: Recent Labs  Lab 05/12/20 1659 05/13/20 0434  WBC 8.7 8.1  NEUTROABS 6.7  --   HGB 12.8 12.5  HCT 39.0 37.8  MCV 90.3 89.4  PLT 258 737   Basic Metabolic Panel: Recent Labs  Lab 05/12/20 1659 05/13/20 0434  NA 138 138  K 4.1 4.0  CL 108 107  CO2 21* 23  GLUCOSE 119* 101*  BUN 16 13  CREATININE 0.53 0.58  CALCIUM 8.4* 8.2*   GFR: Estimated Creatinine Clearance: 66.4 mL/min (by C-G formula based on SCr of 0.58 mg/dL). Liver Function Tests: Recent Labs  Lab 05/12/20 1659  05/13/20 0434  AST 25 21  ALT 35 31  ALKPHOS 39 38  BILITOT 0.8 0.7  PROT 6.7 6.4*  ALBUMIN 3.6 3.4*   No results for input(s): LIPASE, AMYLASE in the last 168 hours. No results for input(s): AMMONIA in the last 168 hours. Coagulation Profile: Recent Labs  Lab 05/12/20 2245 05/13/20 0434  INR 1.0 1.0   Cardiac Enzymes: No results for input(s): CKTOTAL, CKMB, CKMBINDEX, TROPONINI in the last 168 hours. BNP (last 3 results) No results for input(s): PROBNP in the last 8760 hours. HbA1C: Recent Labs    05/13/20 0434  HGBA1C 5.2   CBG: No results for input(s): GLUCAP in the last 168 hours. Lipid Profile: No results for input(s): CHOL, HDL, LDLCALC, TRIG, CHOLHDL, LDLDIRECT in the last 72 hours. Thyroid Function Tests: Recent Labs    05/12/20 1659  TSH 1.561  FREET4 1.01   Anemia Panel: No results for input(s): VITAMINB12, FOLATE, FERRITIN, TIBC, IRON, RETICCTPCT in the last 72 hours. Sepsis Labs: Recent Labs  Lab 05/12/20 1659  LATICACIDVEN 1.3  Recent Results (from the past 240 hour(s))  SARS Coronavirus 2 by RT PCR (hospital order, performed in [facility] hospital lab) Nasopharyngeal Nasopharyngeal Swab     Status: None   Collection Time: 05/12/20  5:48 PM   Specimen: Nasopharyngeal Swab  Result Value Ref Range Status   SARS Coronavirus 2 NEGATIVE NEGATIVE Final    Comment: (NOTE) SARS-CoV-2 target nucleic acids are NOT DETECTED.  The SARS-CoV-2 RNA is generally detectable in upper and lower respiratory specimens during the acute phase of infection. The lowest concentration of SARS-CoV-2 viral copies this assay can detect is 250 copies / mL. A negative result does not preclude SARS-CoV-2 infection and should not be used as the sole basis for treatment or other patient management decisions.  A negative result may occur with improper specimen collection / handling, submission of specimen other than nasopharyngeal swab, presence of viral mutation(s) within  the areas targeted by this assay, and inadequate number of viral copies (<250 copies / mL). A negative result must be combined with clinical observations, patient history, and epidemiological information.  Fact Sheet for Patients:   StrictlyIdeas.no  Fact Sheet for Healthcare Providers: BankingDealers.co.za  This test is not yet approved or  cleared by the [place] FDA and has been authorized for detection and/or diagnosis of SARS-CoV-2 by FDA under an Emergency Use Authorization (EUA).  This EUA will remain in effect (meaning this test can be used) for the duration of the COVID-19 declaration under Section 564(b)(1) of the Act, 21 U.S.C. section 360bbb-3(b)(1), unless the authorization is terminated or revoked sooner.  Performed at [facility], [address] [place],  [address]          Radiology Studies: DG Chest 2 View  Result Date: 05/12/2020 CLINICAL DATA:  Shortness of breath EXAM: CHEST - 2 VIEW COMPARISON:  Radiograph 04/18/2020, CT 02/23/2020 FINDINGS: There is increasing diffuse hazy interstitial opacities with peribronchovascular cuffing, fissural and septal thickening and a new moderate left pleural effusion tracking along the lateral left lung. No right effusion. No pneumothorax. The aorta is calcified. The remaining cardiomediastinal contours are unremarkable. Cardiac silhouette partially obscured by overlying opacity though appears grossly similar to comparison exam. Dextrocurvature of the midthoracic spine, similar to prior. Prior surgical repair of the right rotator cuff with anchor in the proximal humerus. No acute osseous or soft tissue abnormality. IMPRESSION: Worsening interstitial opacities, favor worsening edema with new moderate left pleural effusion. Adjacent opacity likely reflect some passive atelectasis and more diffuse edematous change or underlying consolidation is difficult to exclude.  Electronically Signed   By: Lovena Le M.D.   On: 05/12/2020 17:31   CT ANGIO CHEST PE W OR WO CONTRAST  Result Date: 05/12/2020 CLINICAL DATA:  Worsening shortness of breath. EXAM: CT ANGIOGRAPHY CHEST WITH CONTRAST TECHNIQUE: Multidetector CT imaging of the chest was performed using the standard protocol during bolus administration of intravenous contrast. Multiplanar CT image reconstructions and MIPs were obtained to evaluate the vascular anatomy. CONTRAST:  20mL OMNIPAQUE IOHEXOL 350 MG/ML SOLN COMPARISON:  Feb 23, 2020 FINDINGS: Cardiovascular: There is mild calcification of the aortic arch. A very small amount of intraluminal low attenuation is seen within a lower lobe branch of the right pulmonary artery (axial CT images 138 through 145, CT series number 5/coronal reformatted images 47 through 49, CT series number 7 and coronal reformatted images 62 through 64, CT series number 7). Normal heart size. No pericardial effusion. Mediastinum/Nodes: There is moderate severity left hilar lymphadenopathy. The largest  lymph node measures approximately 2.0 cm. Mild mediastinal lymphadenopathy is seen within the region of the AP window. Thyroid gland, trachea, and esophagus demonstrate no significant findings. Lungs/Pleura: Small slightly patchy focal areas of atelectasis and/or early infiltrate are seen within the anterolateral aspect of the right upper lobe and posteromedial aspect of the right lower lobe. Moderate to marked severity consolidation is seen throughout the left lower lobe. A 6 mm noncalcified lung nodule is also seen within the anterior aspect of the left upper lobe. There is a moderate sized left pleural effusion. No pneumothorax is identified. Upper Abdomen: Stable, adjacent 1.6 cm and 1.7 cm well-defined areas of low attenuation are seen within the left lobe of the liver. Musculoskeletal: There is marked severity dextroscoliosis of the thoracic spine. Multilevel degenerative changes are seen.  Review of the MIP images confirms the above findings. IMPRESSION: 1. Very small amount of acute pulmonary embolism within a lower lobe branch of the right pulmonary artery. 2. Moderate to marked severity consolidation throughout the left lower lobe, with a moderate sized left pleural effusion. 3. Small slightly patchy focal areas of atelectasis and/or early infiltrate are seen within the anterolateral aspect of the right upper lobe and posteromedial aspect of the right lower lobe. 4. 6 mm noncalcified lung nodule within the anterior aspect of the left upper lobe. Correlation with 12 month follow-up chest CT is recommended to determine stability. 5. Stable, adjacent well-defined areas of low attenuation within the left lobe of the liver. These may represent hemangiomas. 6. Aortic atherosclerosis. Aortic Atherosclerosis (ICD10-I70.0). Electronically Signed   By: Virgina Norfolk M.D.   On: 05/12/2020 19:19   ECHOCARDIOGRAM COMPLETE  Result Date: 05/13/2020    ECHOCARDIOGRAM REPORT   Patient Name:   Elizabeth [name] Date of Exam: 05/13/2020 Medical Rec #:  [id number]                  Height:       62.0 in Accession #:    [id number]                 Weight:       174.0 lb Date of Birth:  1950-11-21                 BSA:          1.802 m Patient Age:    80 years                   BP:           115/54 mmHg Patient Gender: F                          HR:           84 bpm. Exam Location:  [facility] Procedure: 2D Echo, Color Doppler and Cardiac Doppler Indications:     Acute respiratory insufficiency 518.82  History:         Patient has no prior history of Echocardiogram examinations.                  Anginal pain, pneumonia.  Sonographer:     Sherrie Sport RDCS (AE) Referring Phys:  [id number] Gretta Cool Aidric [place] Diagnosing Phys: Nelva Bush MD  Sonographer Comments: No apical window, no parasternal window and Technically challenging study due to limited acoustic windows. IMPRESSIONS  1. Left ventricular ejection fraction, by  estimation, is >55%. The left ventricle has normal function. Left ventricular endocardial border  not optimally defined to evaluate regional wall motion. There is mild left ventricular hypertrophy. Left ventricular diastolic function could not be evaluated.  2. Right ventricular systolic function is normal. The right ventricular size is normal.  3. Moderate pleural effusion in the left lateral region.  4. The mitral valve was not well visualized.  5. The aortic valve was not well visualized. FINDINGS  Left Ventricle: Left ventricular ejection fraction, by estimation, is >55%. The left ventricle has normal function. Left ventricular endocardial border not optimally defined to evaluate regional wall motion. The left ventricular internal cavity size was  normal in size. There is mild left ventricular hypertrophy. Left ventricular diastolic function could not be evaluated. Right Ventricle: The right ventricular size is normal. No increase in right ventricular wall thickness. Right ventricular systolic function is normal. Left Atrium: Left atrial size was not well visualized. Right Atrium: Right atrial size was not well visualized. Pericardium: Trivial pericardial effusion is present. Presence of pericardial fat pad. Mitral Valve: The mitral valve was not well visualized. Tricuspid Valve: The tricuspid valve is not well visualized. Aortic Valve: The aortic valve was not well visualized. Pulmonic Valve: The pulmonic valve was not well visualized. Aorta: The aortic root was not well visualized. IAS/Shunts: The interatrial septum was not well visualized. Additional Comments: There is a moderate pleural effusion in the left lateral region.  LEFT VENTRICLE PLAX 2D LVIDd:         2.74 cm LVIDs:         1.98 cm LV PW:         1.30 cm LV IVS:        1.07 cm  Nelva Bush MD Electronically signed by Nelva Bush MD Signature Date/Time: 05/13/2020/12:23:16 PM    Final         Scheduled Meds: . albuterol  2.5 mg  Nebulization Q6H  . ALPRAZolam  1.5 mg Oral QHS  . calcium-vitamin D  1 tablet Oral Daily  . guaiFENesin  600 mg Oral BID  . pramipexole  1 mg Oral QHS  . sodium chloride flush  3 mL Intravenous Once  . sodium chloride flush  3 mL Intravenous Q12H  . tamoxifen  20 mg Oral Daily   Continuous Infusions: . sodium chloride      Para Skeans, DO [facility] Pager [phone] If 7PM-7AM, please contact night-coverage [email or web address] Password [email or web address] 05/13/2020, 12:44 PM

## 2020-05-13 NOTE — Progress Notes (Signed)
*  PRELIMINARY RESULTS* Echocardiogram 2D Echocardiogram has been performed.  Sherrie Sport 05/13/2020, 9:49 AM

## 2020-05-13 NOTE — Consult Note (Signed)
Cardiology Consultation:   Patient ID: Brendy Ficek; [id number]; 13-Jan-1951   Admit date: 05/12/2020 Date of Consult: 05/13/2020  Primary Care Provider: Birdie Sons, MD Primary Cardiologist: Ag[place]Etang Primary Electrophysiologist:  None   Patient Profile:   Elizabeth [name] is a 69 y.o. female with a hx of nonsmall cell lung cancer deemed inoperable s/p chemoradiation, right-sided breast cancer diagnosed in 04/2019, s/p radical lumpectomy with radiation, DJD, GERD, headaches, and anxiety who is being seen today for the evaluation of dyspnea at the request of Dr. Posey Pronto.  History of Present Illness:   Ms. Sligh was recently evaluated in our office on 05/05/2020 for a several year history of chest pain that radiated to her left jaw.  In the setting she was scheduled for coronary CTA.  Initially, she attributed her symptoms of dyspnea to her chemotherapy infusions and was evaluated by pulmonology with symptoms felt to be related to GERD.  Approximately 10 days ago she developed severe right ankle edema with associated erythema and was prescribed Bactrim for possible cellulitis.  In this setting she became more dyspneic with associated orthopnea.  She denied any chest pain.  She does report some weight gain though it appears her weight has been stable dating back several months.  She was scheduled for a coronary CTA on 8/5 though this was unable to be completed secondary to tachycardic heart rates and associated dyspnea which improved with supplemental oxygen.  Upon returning back home she contacted her oncology office to report to them that her dyspnea was improved with supplemental oxygen and asked for a prescription for this.  In this setting she was advised to come to the ER.  Upon the patient's arrival to [facility] they were found to have stable BP and heart rate.  Her oxygen saturation was 92% on room air, with a respiratory rate of 24.  Weight 78.9 kg which  is down approximately 2 kg compared to her weight at her PCPs office at the end of July. EKG showed sinus rhythm with no acute ST-T changes as outlined below, CXR showed left pleural effusion along with atelectasis. CTA chest showed small acute pulmonary embolus within a lower lobe branch of the right pulmonary artery, consolidation throughout the left lower lobe with a moderate sized left pleural effusion, atelectasis vs early infiltrate, pulmonary nodule, possible hemangiomas within the left lobe of the liver, and aortic atherosclerosis. Labs showed covid negative, BNP 35, no troponin, CBC normal, TSH normal, BUN/SCr normal. She has been started on heparin gtt for PE. Cardiology has been asked to evaluate her dyspnea. Currently, she remains on supplemental oxygen via nasal cannula and continues to note some labored breathing.  Past Medical History:  Diagnosis Date  . Anginal pain ([place])   . Anxiety   . Breast cancer, right ([place]) 04/2019   breast cancer (radical lumpectomy and radiation  . Complication of anesthesia    one time woke when she had a tube down throat and had a panic attack  . Constipation due to opioid therapy   . Degenerative joint disease (DJD) of hip   . GERD (gastroesophageal reflux disease)   . Headache    migraines when she was working  . Osteopenia   . Pneumonia   . Restless leg syndrome    takes Mirapex and xanax  . Scoliosis   . SUI (stress urinary incontinence, female)   . Vaso-vagal reaction    after back surgery    Past Surgical History:  Procedure Laterality  Date  . [place]  . BACK SURGERY    . BREAST BIOPSY Right 04/27/2019   Affirm Bx "X" clip-INVASIVE MAMMARY CARCINOMA,  . BREAST CYST ASPIRATION Right 1988  . BREAST EXCISIONAL BIOPSY Right 1990   benign x 3  . BREAST LUMPECTOMY Right 05/08/2019  . BREAST LUMPECTOMY WITH SENTINEL LYMPH NODE BIOPSY Right 05/08/2019   Procedure: RIGHT BREAST LUMPECTOMY WITH SENTINEL LYMPH NODE BX AND WIRE LOC.,  LATEX ALLERGY;  Surgeon: Jules Husbands, MD;  Location: [facility];  Service: General;  Laterality: Right;  . BREAST SURGERY    . CATARACT EXTRACTION W/PHACO Right 03/31/2018   Procedure: CATARACT EXTRACTION PHACO AND INTRAOCULAR LENS PLACEMENT ([place]) RIGHT;  Surgeon: Leandrew Koyanagi, MD;  Location: [place];  Service: Ophthalmology;  Laterality: Right;  Latex sensitivity  . CATARACT EXTRACTION W/PHACO Left 04/16/2018   Procedure: CATARACT EXTRACTION PHACO AND INTRAOCULAR LENS PLACEMENT ([place])  LEFT;  Surgeon: Leandrew Koyanagi, MD;  Location: [place];  Service: Ophthalmology;  Laterality: Left;  . EYE SURGERY     Lasik  . FINE NEEDLE ASPIRATION  12/22/2019   Procedure: FINE NEEDLE ASPIRATION (FNA) LINEAR;  Surgeon: Collene Gobble, MD;  Location: [facility];  Service: Pulmonary;;  . FOOT SURGERY Bilateral   . HYSTEROTOMY    . LAMINECTOMY  2006  . ROTATOR CUFF REPAIR Bilateral   . TONSILLECTOMY     age 37  . TOTAL HIP ARTHROPLASTY Right 01/18/2015   Procedure: RIGHT TOTAL HIP ARTHROPLASTY ANTERIOR APPROACH;  Surgeon: Renette Butters, MD;  Location: [place];  Service: Orthopedics;  Laterality: Right;  . TUBAL LIGATION    . VIDEO BRONCHOSCOPY WITH ENDOBRONCHIAL ULTRASOUND N/A 12/22/2019   Procedure: VIDEO BRONCHOSCOPY WITH ENDOBRONCHIAL ULTRASOUND;  Surgeon: Collene Gobble, MD;  Location: [facility];  Service: Pulmonary;  Laterality: N/A;     Home Meds: Prior to Admission medications   Medication Sig Start Date End Date Taking? Authorizing Provider  ALPRAZolam Duanne Moron) 0.5 MG tablet Take 3 tablets (1.5 mg total) by mouth at bedtime. 12/22/19  Yes Collene Gobble, MD  calcium-vitamin D (OSCAL WITH D) 500-200 MG-UNIT tablet Take 1 tablet by mouth daily.   Yes [provider]  famotidine (PEPCID) 20 MG tablet One after supper Patient taking differently: Take 20 mg by mouth daily. One after supper 04/18/20  Yes Tanda Rockers, MD  Multiple Vitamin  (MULTIVITAMIN WITH MINERALS) TABS tablet Take 1 tablet by mouth daily.   Yes [provider]  pantoprazole (PROTONIX) 40 MG tablet Take 1 tablet (40 mg total) by mouth daily. Take 30-60 min before first meal of the day 04/18/20  Yes Tanda Rockers, MD  pramipexole (MIRAPEX) 1 MG tablet TAKE 1 TABLET BY MOUTH AT BEDTIME Patient taking differently: Take 1 mg by mouth at bedtime.  12/29/19  Yes Birdie Sons, MD  sulfamethoxazole-trimethoprim (BACTRIM DS) 800-160 MG tablet Take 1 tablet by mouth 2 (two) times daily. 05/03/20  Yes Birdie Sons, MD  tamoxifen (NOLVADEX) 20 MG tablet Take 1 tablet (20 mg total) by mouth daily. 08/31/19  Yes Corcoran, Drue Second, MD  clindamycin (CLEOCIN) 150 MG capsule Take 450 mg by mouth See admin instructions. Take before dental procedures     [provider]  ivabradine (CORLANOR) 5 MG TABS tablet Take 2 hours prior to cardiac CT along with metoprolol (100mg  tablet) 05/12/20   Kate Sable, MD  metoprolol tartrate (LOPRESSOR) 100 MG tablet Take 1 tablet (100 mg total) by  mouth once for 1 dose. Take 2 hours prior to your CT scan. 05/12/20 05/12/20  Kate Sable, MD  nitroGLYCERIN (NITROSTAT) 0.4 MG SL tablet Place 1 tablet (0.4 mg total) under the tongue every 5 (five) minutes as needed for chest pain. Go to ER if third tablet is necessary 05/03/20   Birdie Sons, MD  ondansetron (ZOFRAN) 4 MG tablet Take 1 tablet (4 mg total) by mouth every 8 (eight) hours as needed for nausea or vomiting. Patient not taking: Reported on 05/12/2020 08/31/19   Lequita Asal, MD  prochlorperazine (COMPAZINE) 10 MG tablet Take 1 tablet (10 mg total) by mouth every 6 (six) hours as needed for nausea or vomiting. Patient not taking: Reported on 05/12/2020 01/11/20   Curt Bears, MD  valACYclovir (VALTREX) 500 MG tablet Take 1 tablet (500 mg total) by mouth daily as needed (Cold sores). 12/22/19   Collene Gobble, MD    Inpatient Medications: Scheduled  Meds: . albuterol  2.5 mg Nebulization Q6H  . ALPRAZolam  1.5 mg Oral QHS  . calcium-vitamin D  1 tablet Oral Daily  . guaiFENesin  600 mg Oral BID  . pramipexole  1 mg Oral QHS  . sodium chloride flush  3 mL Intravenous Once  . sodium chloride flush  3 mL Intravenous Q12H  . tamoxifen  20 mg Oral Daily   Continuous Infusions: . sodium chloride    . heparin 1,000 Units/hr (05/13/20 0320)   PRN Meds: sodium chloride, albuterol, ondansetron, sodium chloride flush, valACYclovir  Allergies:   Allergies  Allergen Reactions  . Zostavax [Zoster Vaccine Live] Rash  . Penicillins Hives    Did it involve swelling of the face/tongue/throat, SOB, or low BP? No Did it involve sudden or severe rash/hives, skin peeling, or any reaction on the inside of your mouth or nose? Yes Did you need to seek medical attention at a hospital or doctor's office? Yes When did it last happen?14 or 15 If all above answers are "NO", may proceed with cephalosporin use.   . Naproxen Hives  . Zoloft [Sertraline Hcl] Hives  . Latex Itching and Rash    Condoms    Social History:   Social History   Socioeconomic History  . Marital status: Married    Spouse name: Not on file  . Number of children: Not on file  . Years of education: Not on file  . Highest education level: Not on file  Occupational History  . Not on file  Tobacco Use  . Smoking status: Former Smoker    Packs/day: 0.50    Years: 20.00    Pack years: 10.00    Types: Cigarettes    Quit date: 01/07/2012    Years since quitting: 8.3  . Smokeless tobacco: Never Used  Vaping Use  . Vaping Use: Never used  Substance and Sexual Activity  . Alcohol use: Yes    Alcohol/week: 5.0 standard drinks    Types: 5 Glasses of wine per week    Comment: red wine  . Drug use: No  . Sexual activity: Yes    Birth control/protection: Post-menopausal  Other Topics Concern  . Not on file  Social History Narrative  . Not on file   Social  Determinants of Health   Financial Resource Strain:   . Difficulty of Paying Living Expenses:   Food Insecurity:   . Worried About Charity fundraiser in the Last Year:   . [place] in the Last  Year:   Transportation Needs:   . Film/video editor (Medical):   Marland Kitchen Lack of Transportation (Non-Medical):   Physical Activity:   . Days of Exercise per Week:   . Minutes of Exercise per Session:   Stress:   . Feeling of Stress :   Social Connections:   . Frequency of Communication with Friends and Family:   . Frequency of Social Gatherings with Friends and Family:   . Attends Religious Services:   . Active Member of Clubs or Organizations:   . Attends Archivist Meetings:   Marland Kitchen Marital Status:   Intimate Partner Violence:   . Fear of Current or Ex-Partner:   . Emotionally Abused:   Marland Kitchen Physically Abused:   . Sexually Abused:      Family History:   Family History  Problem Relation Age of Onset  . Alcohol abuse Mother   . Cancer Mother 59       lung  . Migraines Mother   . Cancer Father 59       prostate  . Diabetes Father   . Alcohol abuse Brother   . Cancer Brother        skin  . Cancer Daughter        breast  . Breast cancer Daughter 63  . Bladder Cancer Neg Hx   . Kidney cancer Neg Hx     ROS:  Review of Systems  Constitutional: Positive for malaise/fatigue. Negative for chills, diaphoresis, fever and weight loss.  HENT: Negative for congestion.   Eyes: Negative for discharge and redness.  Respiratory: Positive for cough and shortness of breath. Negative for hemoptysis, sputum production and wheezing.   Cardiovascular: Positive for orthopnea and leg swelling. Negative for chest pain, palpitations, claudication and PND.       RLE swelling with erythema ~ 10 days prior  Gastrointestinal: Negative for abdominal pain, blood in stool, heartburn, melena, nausea and vomiting.  Musculoskeletal: Negative for falls and myalgias.  Skin: Negative for rash.    Neurological: Positive for weakness. Negative for dizziness, tingling, tremors, sensory change, speech change, focal weakness and loss of consciousness.  Endo/Heme/Allergies: Does not bruise/bleed easily.  Psychiatric/Behavioral: Negative for substance abuse. The patient is not nervous/anxious.   All other systems reviewed and are negative.     Physical Exam/Data:   Vitals:   05/12/20 2240 05/12/20 2302 05/13/20 0301 05/13/20 0542  BP: 123/66 (!) 142/70  (!) 114/56  Pulse: 92 99  87  Resp: (!) 22   17  Temp:  97.7 F (36.5 C)  98 F (36.7 C)  TempSrc:  Oral  Oral  SpO2: 98% 99% 99% 97%  Weight:      Height:        Intake/Output Summary (Last 24 hours) at 05/13/2020 2297 Last data filed at 05/13/2020 0320 Gross per 24 hour  Intake 85.08 ml  Output --  Net 85.08 ml   Filed Weights   05/12/20 1641  Weight: 78.9 kg   Body mass index is 31.83 kg/m.   Physical Exam: General: Well developed, well nourished, in no acute distress. Head: Normocephalic, atraumatic, sclera non-icteric, no xanthomas, nares without discharge.  Neck: Negative for carotid bruits. JVD not elevated. Lungs: Diminished breath sounds along the left base. Breathing is mildly labored. On supplemental oxygen.  Heart: RRR with S1 S2. No murmurs, rubs, or gallops appreciated. Abdomen: Soft, non-tender, non-distended with normoactive bowel sounds. No hepatomegaly. No rebound/guarding. No obvious abdominal masses. Msk:  Strength and tone  appear normal for age. Extremities: No clubbing or cyanosis. No edema. Distal pedal pulses are 2+ and equal bilaterally. Neuro: Alert and oriented X 3. No facial asymmetry. No focal deficit. Moves all extremities spontaneously. Psych:  Responds to questions appropriately with a normal affect.   EKG:  The EKG was personally reviewed and demonstrates: NSR, 81 bpm, low voltage QRS, baseline artifact, no acute st/t changes  Telemetry:  Telemetry was personally reviewed and  demonstrates: SR  Weights: [employer]   05/12/20 1641  Weight: 78.9 kg    Relevant CV Studies: None available for review  Laboratory Data:  Chemistry Recent Labs  Lab 05/12/20 1659 05/13/20 0434  NA 138 138  K 4.1 4.0  CL 108 107  CO2 21* 23  GLUCOSE 119* 101*  BUN 16 13  CREATININE 0.53 0.58  CALCIUM 8.4* 8.2*  GFRNONAA >60 >60  GFRAA >60 >60  ANIONGAP 9 8    Recent Labs  Lab 05/12/20 1659 05/13/20 0434  PROT 6.7 6.4*  ALBUMIN 3.6 3.4*  AST 25 21  ALT 35 31  ALKPHOS 39 38  BILITOT 0.8 0.7   Hematology Recent Labs  Lab 05/12/20 1659 05/13/20 0434  WBC 8.7 8.1  RBC 4.32 4.23  HGB 12.8 12.5  HCT 39.0 37.8  MCV 90.3 89.4  MCH 29.6 29.6  MCHC 32.8 33.1  RDW 13.1 13.1  PLT 258 237   Cardiac EnzymesNo results for input(s): TROPONINI in the last 168 hours. No results for input(s): TROPIPOC in the last 168 hours.  BNP Recent Labs  Lab 05/12/20 1659  BNP 35.6    DDimer No results for input(s): DDIMER in the last 168 hours.  Radiology/Studies:  DG Chest 2 View  Result Date: 05/12/2020 IMPRESSION: Worsening interstitial opacities, favor worsening edema with new moderate left pleural effusion. Adjacent opacity likely reflect some passive atelectasis and more diffuse edematous change or underlying consolidation is difficult to exclude. Electronically Signed   By: Lovena Le M.D.   On: 05/12/2020 17:31   CT ANGIO CHEST PE W OR WO CONTRAST  Result Date: 05/12/2020 IMPRESSION: 1. Very small amount of acute pulmonary embolism within a lower lobe branch of the right pulmonary artery. 2. Moderate to marked severity consolidation throughout the left lower lobe, with a moderate sized left pleural effusion. 3. Small slightly patchy focal areas of atelectasis and/or early infiltrate are seen within the anterolateral aspect of the right upper lobe and posteromedial aspect of the right lower lobe. 4. 6 mm noncalcified lung nodule within the anterior aspect of the  left upper lobe. Correlation with 12 month follow-up chest CT is recommended to determine stability. 5. Stable, adjacent well-defined areas of low attenuation within the left lobe of the liver. These may represent hemangiomas. 6. Aortic atherosclerosis. Aortic Atherosclerosis (ICD10-I70.0). Electronically Signed   By: Virgina Norfolk M.D.   On: 05/12/2020 19:19    Assessment and Plan:   1. Dyspnea: -Likely multifactorial including acute PE, lung cancer, left-sided pleural effusion, and prior tobacco use -CTA chest with small PE within a branch of the right pulmonary artery, now on heparin -Left pleural effusion, cannot exclude malignant effusion -Echo pending to evaluate for cardiac component of her presentation -Suspect her presentation is noncardiac at this point in time -BNP normal -No troponin cycled -EKG not acute -Weight is down approximately 2 kg when compared to her PCP visit on 7/27 -Further recommendation pending echo  -Can follow up for consideration of repeat coronary CTA as an outpatient as  this was not able to be completed on 8/5 secondary to tachycardia, which was possibly secondary to her PE  2. PE: -Heparin currently -DOAC management per IM -Contributing to her presentation   3. Left pleural effusion: -Cannot, cannot exclude malignant effusion given her history of cancer, consider diagnostic thoracentesis, deferred to IM  4. Lung/breast cancer: -Contributing to presentation  -Followed by oncology  -Management per IM while admitted    For questions or updates, please contact [place] Please consult [email or web address] for contact info under Cardiology/STEMI.   Signed, Christell Faith, PA-C [place] Pager: [phone] 05/13/2020, 7:02 AM

## 2020-05-13 NOTE — Progress Notes (Signed)
Received report from [facility] that prior CTA chest showed very small amount of acute pulmonary embolism within a lower lobe branch of the right pulmonary artery  Plan Pulmonary Embolism  -Start Heparin per nomogram pharmacy to manage -Bleeding precautions -Obtain 2D Echocardiogram eval for right heart strain     Rufina Falco, BSN, MSN, DNP, [employer]  [facility] Nurse Practitioner  [facility]

## 2020-05-14 ENCOUNTER — Inpatient Hospital Stay: Payer: Medicare Other

## 2020-05-14 DIAGNOSIS — R0600 Dyspnea, unspecified: Secondary | ICD-10-CM

## 2020-05-14 DIAGNOSIS — R590 Localized enlarged lymph nodes: Secondary | ICD-10-CM

## 2020-05-14 LAB — COMPREHENSIVE METABOLIC PANEL
ALT: 46 U/L — ABNORMAL HIGH (ref 0–44)
AST: 29 U/L (ref 15–41)
Albumin: 3.4 g/dL — ABNORMAL LOW (ref 3.5–5.0)
Alkaline Phosphatase: 39 U/L (ref 38–126)
Anion gap: 9 (ref 5–15)
BUN: 11 mg/dL (ref 8–23)
CO2: 24 mmol/L (ref 22–32)
Calcium: 8.3 mg/dL — ABNORMAL LOW (ref 8.9–10.3)
Chloride: 102 mmol/L (ref 98–111)
Creatinine, Ser: 0.54 mg/dL (ref 0.44–1.00)
GFR calc Af Amer: >60 mL/min (ref >60)
GFR calc non Af Amer: >60 mL/min (ref >60)
Glucose, Bld: 115 mg/dL — ABNORMAL HIGH (ref 70–99)
Potassium: 4 mmol/L (ref 3.5–5.1)
Sodium: 135 mmol/L (ref 135–145)
Total Bilirubin: 1.1 mg/dL (ref 0.3–1.2)
Total Protein: 6.3 g/dL — ABNORMAL LOW (ref 6.5–8.1)

## 2020-05-14 LAB — CBC
HCT: 37.6 % (ref 36.0–46.0)
Hemoglobin: 12.2 g/dL (ref 12.0–15.0)
MCH: 29.4 pg (ref 26.0–34.0)
MCHC: 32.4 g/dL (ref 30.0–36.0)
MCV: 90.6 fL (ref 80.0–100.0)
Platelets: 220 10*3/uL (ref 150–400)
RBC: 4.15 MIL/uL (ref 3.87–5.11)
RDW: 13.2 % (ref 11.5–15.5)
WBC: 11.1 10*3/uL — ABNORMAL HIGH (ref 4.0–10.5)
nRBC: 0 % (ref 0.0–0.2)

## 2020-05-14 LAB — ACID FAST SMEAR (AFB, MYCOBACTERIA): Acid Fast Smear: NEGATIVE

## 2020-05-14 LAB — TRIGLYCERIDES, BODY FLUIDS: Triglycerides, Fluid: 71 mg/dL

## 2020-05-14 LAB — LACTATE DEHYDROGENASE: LDH: 163 U/L (ref 98–192)

## 2020-05-14 MED ORDER — APIXABAN 5 MG PO TABS: 5.0000 mg | ORAL_TABLET | Freq: Two times a day (BID) | ORAL | Status: DC

## 2020-05-14 MED ORDER — APIXABAN 5 MG PO TABS
10.0000 mg | ORAL_TABLET | Freq: Two times a day (BID) | ORAL | Status: DC
  Administered 2020-05-14 – 2020-05-16 (×4): 10 mg via ORAL
  Filled 2020-05-14 (×4): qty 2

## 2020-05-14 MED ORDER — BISACODYL 5 MG PO TBEC
5.0000 mg | DELAYED_RELEASE_TABLET | Freq: Every day | ORAL | Status: DC | PRN
  Administered 2020-05-15: 5 mg via ORAL
  Filled 2020-05-14: qty 1

## 2020-05-14 MED ORDER — POLYETHYLENE GLYCOL 3350 17 G PO PACK
17.0000 g | PACK | Freq: Every day | ORAL | Status: DC
  Administered 2020-05-14 – 2020-05-16 (×4): 17 g via ORAL
  Filled 2020-05-14 (×4): qty 1

## 2020-05-14 MED ORDER — ALBUTEROL SULFATE (2.5 MG/3ML) 0.083% IN NEBU
2.5000 mg | INHALATION_SOLUTION | Freq: Three times a day (TID) | RESPIRATORY_TRACT | Status: DC
  Administered 2020-05-14 – 2020-05-16 (×8): 2.5 mg via RESPIRATORY_TRACT
  Filled 2020-05-14 (×8): qty 3

## 2020-05-14 NOTE — Progress Notes (Addendum)
Progress Note  Patient Name: Elizabeth [name] Date of Encounter: 05/14/2020  [facility] Cardiologist: Kate Sable, MD   Subjective   Feeling a little better after thoracentesis.  Her breathing has improved.  She denies chest pain.  Inpatient Medications    Scheduled Meds: . albuterol  2.5 mg Nebulization TID  . calcium-vitamin D  1 tablet Oral Daily  . guaiFENesin  600 mg Oral BID  . polyethylene glycol  17 g Oral Daily  . pramipexole  1 mg Oral QHS  . sodium chloride flush  3 mL Intravenous Once  . sodium chloride flush  3 mL Intravenous Q12H  . tamoxifen  20 mg Oral Daily   Continuous Infusions: . sodium chloride     PRN Meds: sodium chloride, albuterol, ALPRAZolam, bisacodyl, ondansetron, oxyCODONE, sodium chloride flush, valACYclovir   Vital Signs    Vitals:   05/14/20 0814 05/14/20 0915 05/14/20 1149 05/14/20 1150  BP:  121/63 (!) 116/59   Pulse:  (!) 107 99   Resp:  17 16   Temp:  98.2 F (36.8 C) 98.2 F (36.8 C)   TempSrc:  Oral Oral   SpO2: 91% 94% 96% 97%  Weight:      Height:        Intake/Output Summary (Last 24 hours) at 05/14/2020 1320 Last data filed at 05/14/2020 0900 Gross per 24 hour  Intake 840 ml  Output --  Net 840 ml   Last 3 Weights 05/12/2020 05/05/2020 05/03/2020  Weight (lbs) 174 lb 176 lb 178 lb  Weight (kg) 78.926 kg 79.833 kg 80.74 kg      Telemetry    Sinus rhythm.  No events- Personally Reviewed  ECG    05/12/2020: Sinus rhythm.  Rate 81 bpm.  Low voltage. - Personally Reviewed  Physical Exam   VS:  BP (!) 116/59 (BP Location: Left Arm)   Pulse 99   Temp 98.2 F (36.8 C) (Oral)   Resp 16   Ht 5\' 2"  (1.575 m)   Wt 78.9 kg   SpO2 97%   BMI 31.83 kg/m  , BMI Body mass index is 31.83 kg/m. GENERAL:  Well appearing HEENT: Pupils equal round and reactive, fundi not visualized, oral mucosa unremarkable NECK:  No jugular venous distention, waveform within normal limits, carotid upstroke brisk and  symmetric, no bruits, no thyromegaly LYMPHATICS:  No cervical adenopathy LUNGS:  Clear to auscultation bilaterally HEART:  RRR.  PMI not displaced or sustained,S1 and S2 within normal limits, no S3, no S4, no clicks, no rubs, no murmurs ABD:  Flat, positive bowel sounds normal in frequency in pitch, no bruits, no rebound, no guarding, no midline pulsatile mass, no hepatomegaly, no splenomegaly EXT:  2 plus pulses throughout, no edema, no cyanosis no clubbing SKIN:  No rashes no nodules NEURO:  Cranial nerves II through XII grossly intact, motor grossly intact throughout PSYCH:  Cognitively intact, oriented to person place and time   Labs    High Sensitivity Troponin:   Recent Labs  Lab 05/13/20 1636 05/13/20 1830  TROPONINIHS <2 <2      Chemistry Recent Labs  Lab 05/12/20 1659 05/13/20 0434 05/14/20 0547  NA 138 138 135  K 4.1 4.0 4.0  CL 108 107 102  CO2 21* 23 24  GLUCOSE 119* 101* 115*  BUN 16 13 11   CREATININE 0.53 0.58 0.54  CALCIUM 8.4* 8.2* 8.3*  PROT 6.7 6.4* 6.3*  ALBUMIN 3.6 3.4* 3.4*  AST 25 21 29   ALT  35 31 46*  ALKPHOS 39 38 39  BILITOT 0.8 0.7 1.1  GFRNONAA >60 >60 >60  GFRAA >60 >60 >60  ANIONGAP 9 8 9      Hematology Recent Labs  Lab 05/12/20 1659 05/13/20 0434 05/14/20 0547  WBC 8.7 8.1 11.1*  RBC 4.32 4.23 4.15  HGB 12.8 12.5 12.2  HCT 39.0 37.8 37.6  MCV 90.3 89.4 90.6  MCH 29.6 29.6 29.4  MCHC 32.8 33.1 32.4  RDW 13.1 13.1 13.2  PLT 258 237 220    BNP Recent Labs  Lab 05/12/20 1659  BNP 35.6     DDimer No results for input(s): DDIMER in the last 168 hours.   Radiology    DG Chest 2 View  Result Date: 05/12/2020 CLINICAL DATA:  Shortness of breath EXAM: CHEST - 2 VIEW COMPARISON:  Radiograph 04/18/2020, CT 02/23/2020 FINDINGS: There is increasing diffuse hazy interstitial opacities with peribronchovascular cuffing, fissural and septal thickening and a new moderate left pleural effusion tracking along the lateral left lung.  No right effusion. No pneumothorax. The aorta is calcified. The remaining cardiomediastinal contours are unremarkable. Cardiac silhouette partially obscured by overlying opacity though appears grossly similar to comparison exam. Dextrocurvature of the midthoracic spine, similar to prior. Prior surgical repair of the right rotator cuff with anchor in the proximal humerus. No acute osseous or soft tissue abnormality. IMPRESSION: Worsening interstitial opacities, favor worsening edema with new moderate left pleural effusion. Adjacent opacity likely reflect some passive atelectasis and more diffuse edematous change or underlying consolidation is difficult to exclude. Electronically Signed   By: Lovena Le M.D.   On: 05/12/2020 17:31   CT ANGIO CHEST PE W OR WO CONTRAST  Result Date: 05/12/2020 CLINICAL DATA:  Worsening shortness of breath. EXAM: CT ANGIOGRAPHY CHEST WITH CONTRAST TECHNIQUE: Multidetector CT imaging of the chest was performed using the standard protocol during bolus administration of intravenous contrast. Multiplanar CT image reconstructions and MIPs were obtained to evaluate the vascular anatomy. CONTRAST:  27mL OMNIPAQUE IOHEXOL 350 MG/ML SOLN COMPARISON:  Feb 23, 2020 FINDINGS: Cardiovascular: There is mild calcification of the aortic arch. A very small amount of intraluminal low attenuation is seen within a lower lobe branch of the right pulmonary artery (axial CT images 138 through 145, CT series number 5/coronal reformatted images 47 through 49, CT series number 7 and coronal reformatted images 62 through 64, CT series number 7). Normal heart size. No pericardial effusion. Mediastinum/Nodes: There is moderate severity left hilar lymphadenopathy. The largest lymph node measures approximately 2.0 cm. Mild mediastinal lymphadenopathy is seen within the region of the AP window. Thyroid gland, trachea, and esophagus demonstrate no significant findings. Lungs/Pleura: Small slightly patchy focal  areas of atelectasis and/or early infiltrate are seen within the anterolateral aspect of the right upper lobe and posteromedial aspect of the right lower lobe. Moderate to marked severity consolidation is seen throughout the left lower lobe. A 6 mm noncalcified lung nodule is also seen within the anterior aspect of the left upper lobe. There is a moderate sized left pleural effusion. No pneumothorax is identified. Upper Abdomen: Stable, adjacent 1.6 cm and 1.7 cm well-defined areas of low attenuation are seen within the left lobe of the liver. Musculoskeletal: There is marked severity dextroscoliosis of the thoracic spine. Multilevel degenerative changes are seen. Review of the MIP images confirms the above findings. IMPRESSION: 1. Very small amount of acute pulmonary embolism within a lower lobe branch of the right pulmonary artery. 2. Moderate to marked severity consolidation  throughout the left lower lobe, with a moderate sized left pleural effusion. 3. Small slightly patchy focal areas of atelectasis and/or early infiltrate are seen within the anterolateral aspect of the right upper lobe and posteromedial aspect of the right lower lobe. 4. 6 mm noncalcified lung nodule within the anterior aspect of the left upper lobe. Correlation with 12 month follow-up chest CT is recommended to determine stability. 5. Stable, adjacent well-defined areas of low attenuation within the left lobe of the liver. These may represent hemangiomas. 6. Aortic atherosclerosis. Aortic Atherosclerosis (ICD10-I70.0). Electronically Signed   By: Virgina Norfolk M.D.   On: 05/12/2020 19:19   DG Chest Port 1 View  Result Date: 05/13/2020 CLINICAL DATA:  Post thoracentesis EXAM: PORTABLE CHEST 1 VIEW COMPARISON:  Radiograph 05/12/2020, CT 05/12/2020 FINDINGS: Significant interval decrease in size of a left pleural effusion with some small residual pleural thickening in the left lung base. Some residual bandlike opacities, likely  atelectatic change in the left infrahilar lung. Residual edematous features present elsewhere throughout both lungs. Cardiomediastinal contours are similar to comparison with a calcified aorta. No acute osseous or soft tissue abnormality. Degenerative changes are present in the imaged spine and shoulders. Unchanged curvature of the spine. Telemetry leads overlie the chest. IMPRESSION: 1. Significant interval decrease in size of a left pleural effusion with only small residual effusion. 2. Partial re-expansion of the left lower lobe with few bandlike opacities likely reflecting subsegmental atelectasis. 3. Persistent features of interstitial edema. 4.  Aortic Atherosclerosis (ICD10-I70.0). Electronically Signed   By: Lovena Le M.D.   On: 05/13/2020 15:47   ECHOCARDIOGRAM COMPLETE  Result Date: 05/13/2020    ECHOCARDIOGRAM REPORT   Patient Name:   MAXENE [name] Date of Exam: 05/13/2020 Medical Rec #:  [id number]                  Height:       62.0 in Accession #:    [id number]                 Weight:       174.0 lb Date of Birth:  1951-03-11                 BSA:          1.802 m Patient Age:    14 years                   BP:           115/54 mmHg Patient Gender: F                          HR:           84 bpm. Exam Location:  [facility] Procedure: 2D Echo, Color Doppler and Cardiac Doppler Indications:     Acute respiratory insufficiency 518.82  History:         Patient has no prior history of Echocardiogram examinations.                  Anginal pain, pneumonia.  Sonographer:     Sherrie Sport RDCS (AE) Referring Phys:  [id number] Gretta Cool PATEL Diagnosing Phys: Nelva Bush MD  Sonographer Comments: No apical window, no parasternal window and Technically challenging study due to limited acoustic windows. IMPRESSIONS  1. Left ventricular ejection fraction, by estimation, is >55%. The left ventricle has normal function. Left ventricular endocardial border not optimally defined to evaluate regional wall motion.  There is mild left ventricular hypertrophy. Left ventricular diastolic function could not be evaluated.  2. Right ventricular systolic function is normal. The right ventricular size is normal.  3. Moderate pleural effusion in the left lateral region.  4. The mitral valve was not well visualized.  5. The aortic valve was not well visualized. FINDINGS  Left Ventricle: Left ventricular ejection fraction, by estimation, is >55%. The left ventricle has normal function. Left ventricular endocardial border not optimally defined to evaluate regional wall motion. The left ventricular internal cavity size was  normal in size. There is mild left ventricular hypertrophy. Left ventricular diastolic function could not be evaluated. Right Ventricle: The right ventricular size is normal. No increase in right ventricular wall thickness. Right ventricular systolic function is normal. Left Atrium: Left atrial size was not well visualized. Right Atrium: Right atrial size was not well visualized. Pericardium: Trivial pericardial effusion is present. Presence of pericardial fat pad. Mitral Valve: The mitral valve was not well visualized. Tricuspid Valve: The tricuspid valve is not well visualized. Aortic Valve: The aortic valve was not well visualized. Pulmonic Valve: The pulmonic valve was not well visualized. Aorta: The aortic root was not well visualized. IAS/Shunts: The interatrial septum was not well visualized. Additional Comments: There is a moderate pleural effusion in the left lateral region.  LEFT VENTRICLE PLAX 2D LVIDd:         2.74 cm LVIDs:         1.98 cm LV PW:         1.30 cm LV IVS:        1.07 cm  Nelva Bush MD Electronically signed by Nelva Bush MD Signature Date/Time: 05/13/2020/12:23:16 PM    Final    US THORACENTESIS ASP PLEURAL SPACE W/IMG GUIDE  Result Date: 05/13/2020 INDICATION: Lung cancer, pleural effusion EXAM: ULTRASOUND GUIDED LEFT THORACENTESIS MEDICATIONS: 1% lidocaine local COMPLICATIONS:  None immediate. PROCEDURE: An ultrasound guided thoracentesis was thoroughly discussed with the patient and questions answered. The benefits, risks, alternatives and complications were also discussed. The patient understands and wishes to proceed with the procedure. Written consent was obtained. Ultrasound was performed to localize and mark an adequate pocket of fluid in the left chest. The area was then prepped and draped in the normal sterile fashion. 1% Lidocaine was used for local anesthesia. Under ultrasound guidance a 6 Fr Safe-T-Centesis catheter was introduced. Thoracentesis was performed. The catheter was removed and a dressing applied. FINDINGS: A total of approximately 1.5 L of blood tinged pleural fluid was removed. Samples were sent to the laboratory as requested by the clinical team. IMPRESSION: Successful ultrasound guided left thoracentesis yielding 1.5 L of pleural fluid. Electronically Signed   By: Jerilynn Mages.  Shick M.D.   On: 05/13/2020 15:44    Cardiac Studies   Echo 05/13/20: IMPRESSIONS   1. Left ventricular ejection fraction, by estimation, is >55%. The left  ventricle has normal function. Left ventricular endocardial border not  optimally defined to evaluate regional wall motion. There is mild left  ventricular hypertrophy. Left  ventricular diastolic function could not be evaluated.  2. Right ventricular systolic function is normal. The right ventricular  size is normal.  3. Moderate pleural effusion in the left lateral region.  4. The mitral valve was not well visualized.  5. The aortic valve was not well visualized.   Patient Profile     69 y.o. 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  GERD, and  anxiety admitted with shortness of breath.  She previously saw Dr. Garen Lah due to chest pain that have been ongoing for years.  She was scheduled to have a coronary CT-a but was unable to complete it because of  tachycardia and chest pain.  She was subsequently found to have a pulmonary embolism and large pleural effusion status post thoracentesis.  Assessment & Plan   # Shortness of breath:  # Pleural effusion: # PE: Patient is now feeling better after drainage of her pleural effusion.  She had 1.5L drained.  Culture and labs are pending.  Suspect this is malignancy related.  She also has a pulmonary embolism.  Heparin is still currently on hold.  Echo revealed normal right and left-sided function.  It seems unlikely that her symptoms are cardiac.  There is no evidence of coronary calcification on her chest CT.  Therefore would not plan to pursue the coronary CT-a as previously planned.  Would resume heparin or other oral anticoagulation as soon as possible given her concern for recurrent underlying malignancy.  Also recommend getting LE Doppler given her report of R ankle pain and swelling.  No further cardiac work-up indicated.   [facility] will sign off.   Medication Recommendations:  Resume anticoagulation Other recommendations (labs, testing, etc):  none Follow up as an outpatient:  As needed  For questions or updates, please contact [facility] Please consult [email or web address] for contact info under        Signed, Skeet Latch, MD  05/14/2020, 1:20 PM

## 2020-05-14 NOTE — Plan of Care (Signed)
Patient A/Ox4 can verbally respond with clear speech, ambulatory up ad lib, and continent of B/B. Skin warm, dry, and intact with no new s/s of impairment. Patient tolerated all medication well, no adverse reactions, or side effects noted. Medicated x 3 with prn medications Xanax 1.5mg  at 2224 for anxiety, Oxycodone 5 mg at 20:13 and 5:58am for sever pain, both medications were effective with no side effects or adverse reactions noted. Patient denies discomfort and pain at this time. No abnormal behaviors noted.

## 2020-05-14 NOTE — Plan of Care (Signed)
  Problem: Education: Goal: Knowledge of General Education information will improve Description: Including pain rating scale, medication(s)/side effects and non-pharmacologic comfort measures Outcome: Progressing   Problem: Health Behavior/Discharge Planning: Goal: Ability to manage health-related needs will improve Outcome: Progressing   Problem: Clinical Measurements: Goal: Ability to maintain clinical measurements within normal limits will improve Outcome: Progressing Goal: Will remain free from infection Outcome: Progressing Goal: Diagnostic test results will improve Outcome: Progressing Goal: Respiratory complications will improve Outcome: Progressing Goal: Cardiovascular complication will be avoided Outcome: Progressing   Problem: Pain Managment: Goal: General experience of comfort will improve Outcome: Progressing   Problem: Safety: Goal: Ability to remain free from injury will improve Outcome: Progressing

## 2020-05-14 NOTE — Plan of Care (Signed)
Received called from [place] in Microbiology that patient Pleural Effusion Fluid results were positive for a few yeast and no wbc at 12:22am. Will notify MD.

## 2020-05-14 NOTE — Progress Notes (Signed)
PROGRESS NOTE    Elizabeth [name]  UXL:[id number] DOB: 05-06-1951 DOA: 05/12/2020 PCP: Birdie Sons, MD    Brief Narrative:  HPI: Elizabeth [name] is a 69 y.o. female with medical history significant of intermittent shortness of breath with dyspnea of exertion and orthopnea since middle of July of this year.  Patient has been under evaluation for her shortness of breath with her oncologist and an echocardiogram is scheduled.  When patient noticed that she is having worsening shortness of breath with shorter distances she relayed this information to the nurse and was advised to come to the emergency room.  Oncologist note reviewed with recommendations for evaluation of immunotherapy mediated pneumonitis.  Discussed with patient that her shortness of breath is most likely due to the moderate-sized pleural effusion found on chest x-ray however would like to pursue CT scan of her chest with contrast to identify any pulmonary embolism but also for any malignancy, as she is at risk for malignancies patient has a history of breast and lung cancer.  Also at bedside both agree with plan of care all questions answered review of systems is negative for any other symptoms.  ED Course:  In the emergency room patient is alert awake and oriented on 2 L of nasal cannula with O2 sats above 90% patient is comfortable not tachypneic and not tachycardic.  She is giving history. Labs are stable.  An EKG done is sinus rhythm with a heart rate of 89.  Chest x-ray shows worsening interstitial opacities and a new moderate left pleural effusion.   Assessment & Plan:   Principal Problem:   Respiratory failure with hypoxia (HCC) Active Problems:   Mediastinal adenopathy   Adenocarcinoma of right lung, stage 3 (HCC)   Major depressive disorder, single episode   DOE (dyspnea on exertion)  Problem Assessment/Plan Respiratory failure with hypoxia (HCC)/Pulmonary embolism  Overview Patient is a  69 year old Caucasian female with past medical history of tobacco abuse and lung cancer currently receiving chemotherapy, presenting to the emergency room with progressive shortness of breath and hypoxia.  Review shows that patient is currently under work-up for dyspnea on exertion and has an echocardiogram pending.  However I suspect this is related to her moderate sized pleural effusion in the left side.  We will proceed with CT imaging for further detailed evaluation patient also has a history of spinal adenopathy which was attributed to her history of breast cancer that is being also followed by oncology. We will start appropriate and indicated therapy once we have results of CTA of the chest. Discussed with patient that we will consider thoracentesis if indicated. We will give patient 1 dose of diuretic therapy.  CTA chest showed RLL PE and after d/w Dr.Shick radiologist it is very tiny, therefore we will not Treat. We will proceed with thoracentesis. Pt anxious and we will get valium prior to her procedure. Pt s/p Left lung thoracentesis and removal of 1.5L blood tinged pleural fluid and pt tolerated procedure well. She is on 2 L -3L [place] and is stable and comfortable. We will start pt on eliquis as it has been 24 hour post procedure and 10 mg bid x 1 week followed by 5 mg bid. LE venous dopplers pending. By Waunita Schooner criteria pleural fluid is exudative and I suspect it is related to her malignancy, afb smear, Cytology is pending> will start on empiric abx with rocephin.   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 Hypermetabolic paratracheal, right hilar, and subcarinal adenopathy, suspicious for active malignancy Repeat CT shows: moderate severity left hilar lymphadenopathy. The largest lymph node measures approximately  2.0 cm. Mild mediastinal lymphadenopathy.   Adenocarcinoma of right lung, stage 3 [facility]) Assessment & Plan Patient is currently receiving immunotherapy with oncologist Dr. Julien Nordmann.   Major depressive disorder, single episode Continue patient's home regimen.   DVT prophylaxis: SCD's Code Status: FULL Code Family Communication:  Spouse . Disposition Plan: Home with home PT.   Consultants:  Cardiology. IR.  Procedures: Left lung thoracentesis.   Subjective: Pt seen today and is feeling sob and is on oxygen 2LNC and is able to answer questions. D/W her about plan for today, she is found to have have very small insignificant pe and  We do not need to continued with blood thinners. D/w her about thoracentesis . Overnight pt was given ivf for hydration and aki but still did not feel better, states her hr was 140 at night and she was having chest pain or palpitation.   Pt seen today she is feeling much better with remoaval of fluid.  Objective: Vitals:   05/14/20 0915 05/14/20 1149 05/14/20 1150 05/14/20 1544  BP: 121/63 (!) 116/59    Pulse: (!) 107 99    Resp: 17 16    Temp: 98.2 F (36.8 C) 98.2 F (36.8 C)    TempSrc: Oral Oral    SpO2: 94% 96% 97% 98%  Weight:      Height:        Intake/Output Summary (Last 24 hours) at 05/14/2020 1625 Last data filed at 05/14/2020 0900 Gross per 24 hour  Intake 720 ml  Output --  Net 720 ml   Filed Weights   05/12/20 1641  Weight: 78.9 kg    Examination: Blood pressure (!) 116/59, pulse 99, temperature 98.2 F (36.8 C), temperature source Oral, resp. rate 16, height 5\' 2"  (1.575 m), weight 78.9 kg, SpO2 98 %.   General exam: Appears calm and comfortable  Respiratory system: Clear to auscultation. Respiratory effort normal. Cardiovascular system: S1 & S2 heard, RRR. No JVD, murmurs, rubs, gallops or clicks. No pedal edema. Gastrointestinal system: Abdomen is nondistended, soft and nontender. No organomegaly or masses felt.  Normal bowel sounds heard. Central nervous system: Alert and oriented. No focal neurological deficits. Extremities: Symmetric 5 x 5 power. Skin: No rashes, lesions or ulcers Psychiatry: Judgement and insight appear normal. Mood & affect appropriate.   Data Reviewed: I have personally reviewed following labs and imaging studies  CBC: Recent Labs  Lab 05/12/20 1659 05/13/20 0434 05/14/20 0547  WBC 8.7 8.1 11.1*  NEUTROABS 6.7  --   --   HGB 12.8 12.5 12.2  HCT 39.0 37.8 37.6  MCV 90.3 89.4 90.6  PLT 258 237 734   Basic Metabolic Panel: Recent Labs  Lab 05/12/20 1659 05/13/20 0434 05/14/20 0547  NA 138 138 135  K 4.1 4.0 4.0  CL 108 107 102  CO2 21* 23 24  GLUCOSE 119* 101* 115*  BUN 16 13 11   CREATININE 0.53 0.58 0.54  CALCIUM 8.4* 8.2* 8.3*   GFR: Estimated Creatinine Clearance: 66.4 mL/min (by C-G formula based on SCr of 0.54 mg/dL). Liver Function Tests: Recent Labs  Lab 05/12/20 1659 05/13/20 0434 05/14/20 0547  AST 25 21 29   ALT 35 31 46*  ALKPHOS 39 38 39  BILITOT 0.8 0.7 1.1  PROT 6.7 6.4* 6.3*  ALBUMIN 3.6 3.4* 3.4*   No results for input(s): LIPASE, AMYLASE in the last 168 hours. No results for input(s): AMMONIA in the last 168 hours. Coagulation Profile: Recent Labs  Lab 05/12/20 2245 05/13/20 0434  INR 1.0 1.0   Cardiac Enzymes: No results for input(s): CKTOTAL, CKMB, CKMBINDEX, TROPONINI in the last 168 hours. BNP (last 3 results) No results for input(s): PROBNP in the last 8760 hours. HbA1C: Recent Labs    05/13/20 0434  HGBA1C 5.2   CBG: No results for input(s): GLUCAP in the last 168 hours. Lipid Profile: No results for input(s): CHOL, HDL, LDLCALC, TRIG, CHOLHDL, LDLDIRECT in the last 72 hours. Thyroid Function Tests: Recent Labs    05/12/20 1659  TSH 1.561  FREET4 1.01   Anemia Panel: No results for input(s): VITAMINB12, FOLATE, FERRITIN, TIBC, IRON, RETICCTPCT in the last 72 hours. Sepsis Labs: Recent Labs  Lab  05/12/20 1659  LATICACIDVEN 1.3    Recent Results (from the past 240 hour(s))  SARS Coronavirus 2 by RT PCR (hospital order, performed in [facility] hospital lab) Nasopharyngeal Nasopharyngeal Swab     Status: None   Collection Time: 05/12/20  5:48 PM   Specimen: Nasopharyngeal Swab  Result Value Ref Range Status   SARS Coronavirus 2 NEGATIVE NEGATIVE Final    Comment: (NOTE) SARS-CoV-2 target nucleic acids are NOT DETECTED.  The SARS-CoV-2 RNA is generally detectable in upper and lower respiratory specimens during the acute phase of infection. The lowest concentration of SARS-CoV-2 viral copies this assay can detect is 250 copies / mL. A negative result does not preclude SARS-CoV-2 infection and should not be used as the sole basis for treatment or other patient management decisions.  A negative result may occur with improper specimen collection / handling, submission of specimen other than nasopharyngeal swab, presence of viral mutation(s) within the areas targeted by this assay, and inadequate number of viral copies (<250 copies / mL). A negative result must be combined with clinical observations, patient history, and epidemiological information.  Fact Sheet for Patients:   StrictlyIdeas.no  Fact Sheet for Healthcare Providers: BankingDealers.co.za  This test is not yet approved or  cleared by the [place] FDA and has been authorized for detection and/or diagnosis of SARS-CoV-2 by FDA under an Emergency Use Authorization (EUA).  This EUA will remain in effect (meaning this test can be used) for the duration of the COVID-19 declaration under Section 564(b)(1) of the Act, 21 U.S.C. section 360bbb-3(b)(1), unless the authorization is terminated or revoked sooner.  Performed at [facility], [address] [place], [place] [address]   Body fluid culture     Status: None (Preliminary result)   Collection Time:  05/13/20  3:20 PM   Specimen: PATH Cytology Pleural fluid  Result Value Ref Range Status   Specimen Description   Final    PLEURAL Performed at [facility], [address], [place], [place] [address]    Special Requests   Final    NONE Performed at [facility], [address] [place], [place] [address]    Gram Stain   Final    NO WBC SEEN FEW YEAST CRITICAL RESULT CALLED TO, READ BACK BY AND VERIFIED WITH: A GLADENY RN 05/14/20 JDW    Culture   Final    NO GROWTH < 12 HOURS Performed at [facility] [address] [place], [place]  [address]    Report Status PENDING  Incomplete  Acid Fast Smear (AFB)     Status: None   Collection Time: 05/13/20  3:20 PM   Specimen: PATH Cytology Pleural fluid  Result Value Ref Range Status   AFB Specimen Processing Concentration  Final   Acid Fast Smear Negative  Final    Comment: (NOTE) Performed At: [facility] [address] [place], [place] [id number] Rush Farmer MD HU:[id number]    Source (AFB) PLEURAL  Final    Comment: Performed at [facility], [address], [place], [place] [address]    Radiology Studies: DG Chest 2 View  Result Date: 05/12/2020 CLINICAL DATA:  Shortness of breath EXAM: CHEST - 2 VIEW COMPARISON:  Radiograph 04/18/2020, CT 02/23/2020 FINDINGS: There is increasing diffuse hazy interstitial opacities with peribronchovascular cuffing, fissural and septal thickening and a new moderate left pleural effusion tracking along the lateral left lung. No right effusion. No pneumothorax. The aorta is calcified. The remaining cardiomediastinal contours are unremarkable. Cardiac silhouette partially obscured by overlying opacity though appears grossly similar to comparison exam. Dextrocurvature of the midthoracic spine, similar to prior. Prior surgical repair of the right rotator cuff with anchor in the proximal humerus. No acute osseous or soft tissue abnormality. IMPRESSION:  Worsening interstitial opacities, favor worsening edema with new moderate left pleural effusion. Adjacent opacity likely reflect some passive atelectasis and more diffuse edematous change or underlying consolidation is difficult to exclude. Electronically Signed   By: Lovena Le M.D.   On: 05/12/2020 17:31   CT ANGIO CHEST PE W OR WO CONTRAST  Result Date: 05/12/2020 CLINICAL DATA:  Worsening shortness of breath. EXAM: CT ANGIOGRAPHY CHEST WITH CONTRAST TECHNIQUE: Multidetector CT imaging of the chest was performed using the standard protocol during bolus administration of intravenous contrast. Multiplanar CT image reconstructions and MIPs were obtained to evaluate the vascular anatomy. CONTRAST:  67mL OMNIPAQUE IOHEXOL 350 MG/ML SOLN COMPARISON:  Feb 23, 2020 FINDINGS: Cardiovascular: There is mild calcification of the aortic arch. A very small amount of intraluminal low attenuation is seen within a lower lobe branch of the right pulmonary artery (axial CT images 138 through 145, CT series number 5/coronal reformatted images 47 through 49, CT series number 7 and coronal reformatted images 62 through 64, CT series number 7). Normal heart size. No pericardial effusion. Mediastinum/Nodes: There is moderate severity left hilar lymphadenopathy. The largest lymph node measures approximately 2.0 cm. Mild mediastinal lymphadenopathy is seen within the region of the AP window. Thyroid gland, trachea, and esophagus demonstrate no significant findings. Lungs/Pleura: Small slightly patchy focal areas of atelectasis and/or early infiltrate are seen within the anterolateral aspect of the right upper lobe and posteromedial aspect of the right lower lobe. Moderate to marked severity consolidation is seen throughout the left lower lobe. A 6 mm noncalcified lung nodule is also seen within the anterior aspect of the left upper lobe. There is a moderate sized left pleural effusion. No pneumothorax is identified. Upper Abdomen:  Stable, adjacent 1.6 cm and 1.7 cm well-defined areas of low attenuation are seen within the left lobe of the liver. Musculoskeletal: There is marked severity dextroscoliosis of the thoracic spine. Multilevel degenerative changes are seen. Review of the MIP images confirms the above findings. IMPRESSION: 1. Very small amount of acute pulmonary embolism within a lower lobe branch of the right pulmonary artery. 2. Moderate to marked severity consolidation throughout the left lower lobe, with a moderate sized left pleural effusion. 3. Small slightly patchy  focal areas of atelectasis and/or early infiltrate are seen within the anterolateral aspect of the right upper lobe and posteromedial aspect of the right lower lobe. 4. 6 mm noncalcified lung nodule within the anterior aspect of the left upper lobe. Correlation with 12 month follow-up chest CT is recommended to determine stability. 5. Stable, adjacent well-defined areas of low attenuation within the left lobe of the liver. These may represent hemangiomas. 6. Aortic atherosclerosis. Aortic Atherosclerosis (ICD10-I70.0). Electronically Signed   By: Virgina Norfolk M.D.   On: 05/12/2020 19:19   US Venous Img Lower Bilateral (DVT)  Result Date: 05/14/2020 CLINICAL DATA:  69 year old with history of lung cancer and small pulmonary emboli on recent CTA. EXAM: BILATERAL LOWER EXTREMITY VENOUS DOPPLER ULTRASOUND TECHNIQUE: Gray-scale sonography with graded compression, as well as color Doppler and duplex ultrasound were performed to evaluate the lower extremity deep venous systems from the level of the common femoral vein and including the common femoral, femoral, profunda femoral, popliteal and calf veins including the posterior tibial, peroneal and gastrocnemius veins when visible. The superficial great saphenous vein was also interrogated. Spectral Doppler was utilized to evaluate flow at rest and with distal augmentation maneuvers in the common femoral, femoral and  popliteal veins. COMPARISON:  None. FINDINGS: RIGHT LOWER EXTREMITY Common Femoral Vein: No evidence of thrombus. Normal compressibility, respiratory phasicity and response to augmentation. Saphenofemoral Junction: No evidence of thrombus. Normal compressibility and flow on color Doppler imaging. Profunda Femoral Vein: No evidence of thrombus. Normal compressibility and flow on color Doppler imaging. Femoral Vein: No evidence of thrombus. Normal compressibility, respiratory phasicity and response to augmentation. Popliteal Vein: No evidence of thrombus. Normal compressibility, respiratory phasicity and response to augmentation. Calf Veins: Positive for thrombus. Paired peroneal veins are not compressible and do not have color Doppler flow. Posterior tibial veins are compressible with color Doppler flow. LEFT LOWER EXTREMITY Common Femoral Vein: No evidence of thrombus. Normal compressibility, respiratory phasicity and response to augmentation. Saphenofemoral Junction: No evidence of thrombus. Normal compressibility and flow on color Doppler imaging. Profunda Femoral Vein: No evidence of thrombus. Normal compressibility and flow on color Doppler imaging. Femoral Vein: No evidence of thrombus. Normal compressibility, respiratory phasicity and response to augmentation. Popliteal Vein: No evidence of thrombus. Normal compressibility, respiratory phasicity and response to augmentation. Calf Veins: No evidence of thrombus. Normal compressibility and flow on color Doppler imaging. Other Findings:  None. IMPRESSION: 1. Positive for deep venous thrombosis in the right calf. Thrombus involving right peroneal veins. 2. Negative for deep venous thrombosis in the left lower extremity. Electronically Signed   By: Markus Daft M.D.   On: 05/14/2020 16:02   DG Chest Port 1 View  Result Date: 05/13/2020 CLINICAL DATA:  Post thoracentesis EXAM: PORTABLE CHEST 1 VIEW COMPARISON:  Radiograph 05/12/2020, CT 05/12/2020 FINDINGS:  Significant interval decrease in size of a left pleural effusion with some small residual pleural thickening in the left lung base. Some residual bandlike opacities, likely atelectatic change in the left infrahilar lung. Residual edematous features present elsewhere throughout both lungs. Cardiomediastinal contours are similar to comparison with a calcified aorta. No acute osseous or soft tissue abnormality. Degenerative changes are present in the imaged spine and shoulders. Unchanged curvature of the spine. Telemetry leads overlie the chest. IMPRESSION: 1. Significant interval decrease in size of a left pleural effusion with only small residual effusion. 2. Partial re-expansion of the left lower lobe with few bandlike opacities likely reflecting subsegmental atelectasis. 3. Persistent features of interstitial edema. 4.  Aortic Atherosclerosis (ICD10-I70.0). Electronically Signed   By: Lovena Le M.D.   On: 05/13/2020 15:47   ECHOCARDIOGRAM COMPLETE  Result Date: 05/13/2020    ECHOCARDIOGRAM REPORT   Patient Name:   SENETRA [name] Date of Exam: 05/13/2020 Medical Rec #:  [id number]                  Height:       62.0 in Accession #:    [id number]                 Weight:       174.0 lb Date of Birth:  December 06, 1950                 BSA:          1.802 m Patient Age:    40 years                   BP:           115/54 mmHg Patient Gender: F                          HR:           84 bpm. Exam Location:  [facility] Procedure: 2D Echo, Color Doppler and Cardiac Doppler Indications:     Acute respiratory insufficiency 518.82  History:         Patient has no prior history of Echocardiogram examinations.                  Anginal pain, pneumonia.  Sonographer:     Sherrie Sport RDCS (AE) Referring Phys:  [id number] Gretta Cool Sheyna Pettibone Diagnosing Phys: Nelva Bush MD  Sonographer Comments: No apical window, no parasternal window and Technically challenging study due to limited acoustic windows. IMPRESSIONS  1. Left ventricular  ejection fraction, by estimation, is >55%. The left ventricle has normal function. Left ventricular endocardial border not optimally defined to evaluate regional wall motion. There is mild left ventricular hypertrophy. Left ventricular diastolic function could not be evaluated.  2. Right ventricular systolic function is normal. The right ventricular size is normal.  3. Moderate pleural effusion in the left lateral region.  4. The mitral valve was not well visualized.  5. The aortic valve was not well visualized. FINDINGS  Left Ventricle: Left ventricular ejection fraction, by estimation, is >55%. The left ventricle has normal function. Left ventricular endocardial border not optimally defined to evaluate regional wall motion. The left ventricular internal cavity size was  normal in size. There is mild left ventricular hypertrophy. Left ventricular diastolic function could not be evaluated. Right Ventricle: The right ventricular size is normal. No increase in right ventricular wall thickness. Right ventricular systolic function is normal. Left Atrium: Left atrial size was not well visualized. Right Atrium: Right atrial size was not well visualized. Pericardium: Trivial pericardial effusion is present. Presence of pericardial fat pad. Mitral Valve: The mitral valve was not well visualized. Tricuspid Valve: The tricuspid valve is not well visualized. Aortic Valve: The aortic valve was not well visualized. Pulmonic Valve: The pulmonic valve was not well visualized. Aorta: The aortic root was not well visualized. IAS/Shunts: The interatrial septum was not well visualized. Additional Comments: There is a moderate pleural effusion in the left lateral region.  LEFT VENTRICLE PLAX 2D LVIDd:         2.74 cm LVIDs:         1.98 cm LV PW:  1.30 cm LV IVS:        1.07 cm  Nelva Bush MD Electronically signed by Nelva Bush MD Signature Date/Time: 05/13/2020/12:23:16 PM    Final    US THORACENTESIS ASP PLEURAL  SPACE W/IMG GUIDE  Result Date: 05/13/2020 INDICATION: Lung cancer, pleural effusion EXAM: ULTRASOUND GUIDED LEFT THORACENTESIS MEDICATIONS: 1% lidocaine local COMPLICATIONS: None immediate. PROCEDURE: An ultrasound guided thoracentesis was thoroughly discussed with the patient and questions answered. The benefits, risks, alternatives and complications were also discussed. The patient understands and wishes to proceed with the procedure. Written consent was obtained. Ultrasound was performed to localize and mark an adequate pocket of fluid in the left chest. The area was then prepped and draped in the normal sterile fashion. 1% Lidocaine was used for local anesthesia. Under ultrasound guidance a 6 Fr Safe-T-Centesis catheter was introduced. Thoracentesis was performed. The catheter was removed and a dressing applied. FINDINGS: A total of approximately 1.5 L of blood tinged pleural fluid was removed. Samples were sent to the laboratory as requested by the clinical team. IMPRESSION: Successful ultrasound guided left thoracentesis yielding 1.5 L of pleural fluid. Electronically Signed   By: Jerilynn Mages.  Shick M.D.   On: 05/13/2020 15:44    Scheduled Meds: . albuterol  2.5 mg Nebulization TID  . calcium-vitamin D  1 tablet Oral Daily  . guaiFENesin  600 mg Oral BID  . polyethylene glycol  17 g Oral Daily  . pramipexole  1 mg Oral QHS  . sodium chloride flush  3 mL Intravenous Once  . sodium chloride flush  3 mL Intravenous Q12H  . tamoxifen  20 mg Oral Daily   Continuous Infusions: . sodium chloride      Para Skeans, MD [facility] Pager [phone] If 7PM-7AM, please contact night-coverage [email or web address] Password [facility] 05/14/2020, 4:25 PM

## 2020-05-15 ENCOUNTER — Inpatient Hospital Stay: Payer: Medicare Other

## 2020-05-15 DIAGNOSIS — J90 Pleural effusion, not elsewhere classified: Secondary | ICD-10-CM

## 2020-05-15 DIAGNOSIS — I2699 Other pulmonary embolism without acute cor pulmonale: Secondary | ICD-10-CM

## 2020-05-15 DIAGNOSIS — J9601 Acute respiratory failure with hypoxia: Secondary | ICD-10-CM

## 2020-05-15 LAB — CBC
HCT: 34.7 % — ABNORMAL LOW (ref 36.0–46.0)
Hemoglobin: 11.6 g/dL — ABNORMAL LOW (ref 12.0–15.0)
MCH: 29.7 pg (ref 26.0–34.0)
MCHC: 33.4 g/dL (ref 30.0–36.0)
MCV: 88.7 fL (ref 80.0–100.0)
Platelets: 192 10*3/uL (ref 150–400)
RBC: 3.91 MIL/uL (ref 3.87–5.11)
RDW: 13.2 % (ref 11.5–15.5)
WBC: 8.4 10*3/uL (ref 4.0–10.5)
nRBC: 0 % (ref 0.0–0.2)

## 2020-05-15 MED ORDER — LACTULOSE 10 GM/15ML PO SOLN
10.0000 g | Freq: Once | ORAL | Status: AC
  Administered 2020-05-15: 10:00:00 10 g via ORAL
  Filled 2020-05-15: qty 30

## 2020-05-15 MED ORDER — SODIUM CHLORIDE 0.9 % IV SOLN
2.0000 g | INTRAVENOUS | Status: DC
  Administered 2020-05-15: 22:00:00 2 g via INTRAVENOUS
  Filled 2020-05-15 (×2): qty 20

## 2020-05-15 MED ORDER — LEVOFLOXACIN IN D5W 750 MG/150ML IV SOLN: 750.0000 mg | INTRAVENOUS | Status: DC

## 2020-05-15 MED ORDER — SODIUM CHLORIDE 0.9 % IV SOLN
500.0000 mg | INTRAVENOUS | Status: DC
  Administered 2020-05-15: 18:00:00 500 mg via INTRAVENOUS
  Filled 2020-05-15 (×2): qty 500

## 2020-05-15 NOTE — Consult Note (Signed)
[facility]  Date of admission:  05/12/2020  Inpatient day:  05/15/2020  Consulting physician: Dr Florina Ou   Reason for Consultation:  Cancer  Chief Complaint: Elizabeth [name] is a 69 y.o. female with a history of stage IA right breast cancer and stage IIIA right lung cancer who was admitted through the emergency room with acute hypoxic respiratory failure secondary to a large left sided pleural effusion.  HPI: The patient has a history of stage IA right breast cancer s/p lumpectomy and sentinel lymph node biopsy on 05/08/2019.  Pathologyrevealed a 1.1 cm grade IIinvasive mammary carcinoma with lobular features and adjacent DCIS. Margins were uninvolved. One lymph node was negative for malignancy.Tumor was ER + (> 90%), PR- (> 90%), and Her1/neu -.Pathologic stagewas pT1c pN0. CA27.29was 20.0 on 05/01/2019.  OncotypeDXtestingrevealed a score of 10, with a 3% chance of distant recurrence in 9 years with adjuvant endocrine therapy alone.Absolute chemotherapy benefit was<1%.  She received breast radiationfrom 06/10/2019 - 07/29/2019.She begantamoxifenon 08/04/2019  She was noted to have an elevated CA27.29 of 60.6 on 11/30/2019.  PET scan on  12/07/2019 revealed hypermetabolic paratracheal, right hilar, and subcarinal adenopathy, suspicious for active malignancy. There was a 1.5 x 0.9 cm right lower lobe nodule with irregular margins (SUV 2.3).    She underwent bronchoscopy with endobronchial ultrasound on 12/22/2019 by Dr. Lamonte Sakai in [place].  Pathology was consistent with adenocarcinoma of the lung.  Lymph node 4R, 7, and 10 R were positive for carcinoma.  PD-L1 was 100%.  BRAF mutation V600E was positive.  The patient has been followed by Dr. Lorna Few in [place].  She received concurrent chemotherapy (carboplatin and Taxol x 5 weekly cycles) and a truncated course of radiation (12 fractions: 01/14/2020 - 01/28/2020) to limit  potential overlap with her previous radiation therapy to her chest..  Chemotherapy completed on 02/09/2020.   She began consolidative immunotherapy with Imfinzi (durvalumab) on 03/03/2020.  She has received 3 cycles to date (last 04/28/2020).  She was last seen by Dr Julien Nordmann on 04/28/2020.  She had a mild cough and shortness of breath.  Oxygen sats were 95-97%.  She describes progressive shortness of breath since mid 04/2020.   She was referred to the emergency room for possible pneumonitis.  CXR on 05/12/2020 revealed worsening interstitial opacities, favor worsening edema with new moderate left-sided pleural effusion.  Chest CT angiogram on 05/12/2020 revealed a very small amount of acute pulmonary embolism within a lower lobe branch of the right pulmonary artery.  There was moderate to marked consolidation throughout the LLL with a moderate size left pleural effusion.  There were small slightly patchy focal areas of atelectasis and/or infiltrate within the anterolateral aspect of the RUL and posterior medial aspect of the RLL.  There was a 6 mm noncalcified lung nodule within the anterior aspect of the LUL.  There was stable well-defined areas of low-attenuation within the left lobe of the liver (possible hemangiomas).  Thoracentesis on 05/13/2020 revealed 1.5 L of blood-tinged pleural fluid.  Pleural fluid:  Cell count 2566 (3% segs, 32% lymphs, 61% monocytes), LDH 670, triglycerides 71, culture (no WBCs, few yeast), amylase 62, albumen 2.7, protein 4.5, glucose 79.  Flow cytometry pending.  Bilateral lower extremity duplex on 05/14/2020 revealed a DVT in the right calf.  There was thrombus involving the right peroneal veins.  There was no DVT in the left lower extremity.  Echo revealed an EF of > 55%.  She notes right ankle edema x 2 weeks.  She was started on Eliquis.   Past Medical History:  Diagnosis Date  . Anginal pain ([place])   . Anxiety   . Breast cancer, right ([place]) 04/2019    breast cancer (radical lumpectomy and radiation  . Complication of anesthesia    one time woke when she had a tube down throat and had a panic attack  . Constipation due to opioid therapy   . Degenerative joint disease (DJD) of hip   . GERD (gastroesophageal reflux disease)   . Headache    migraines when she was working  . Osteopenia   . Pneumonia   . Restless leg syndrome    takes Mirapex and xanax  . Scoliosis   . SUI (stress urinary incontinence, female)   . Vaso-vagal reaction    after back surgery    Past Surgical History:  Procedure Laterality Date  . APPENDECTOMY  1963  . BACK SURGERY    . BREAST BIOPSY Right 04/27/2019   Affirm Bx "X" clip-INVASIVE MAMMARY CARCINOMA,  . BREAST CYST ASPIRATION Right 1988  . BREAST EXCISIONAL BIOPSY Right 1990   benign x 3  . BREAST LUMPECTOMY Right 05/08/2019  . BREAST LUMPECTOMY WITH SENTINEL LYMPH NODE BIOPSY Right 05/08/2019   Procedure: RIGHT BREAST LUMPECTOMY WITH SENTINEL LYMPH NODE BX AND WIRE LOC., LATEX ALLERGY;  Surgeon: Jules Husbands, MD;  Location: [facility];  Service: General;  Laterality: Right;  . BREAST SURGERY    . CATARACT EXTRACTION W/PHACO Right 03/31/2018   Procedure: CATARACT EXTRACTION PHACO AND INTRAOCULAR LENS PLACEMENT ([place]) RIGHT;  Surgeon: Leandrew Koyanagi, MD;  Location: [place];  Service: Ophthalmology;  Laterality: Right;  Latex sensitivity  . CATARACT EXTRACTION W/PHACO Left 04/16/2018   Procedure: CATARACT EXTRACTION PHACO AND INTRAOCULAR LENS PLACEMENT ([place])  LEFT;  Surgeon: Leandrew Koyanagi, MD;  Location: [place];  Service: Ophthalmology;  Laterality: Left;  . EYE SURGERY     Lasik  . FINE NEEDLE ASPIRATION  12/22/2019   Procedure: FINE NEEDLE ASPIRATION (FNA) LINEAR;  Surgeon: Collene Gobble, MD;  Location: [facility];  Service: Pulmonary;;  . FOOT SURGERY Bilateral   . HYSTEROTOMY    . LAMINECTOMY  2006  . ROTATOR CUFF REPAIR Bilateral   . TONSILLECTOMY     age 65   . TOTAL HIP ARTHROPLASTY Right 01/18/2015   Procedure: RIGHT TOTAL HIP ARTHROPLASTY ANTERIOR APPROACH;  Surgeon: Renette Butters, MD;  Location: [place];  Service: Orthopedics;  Laterality: Right;  . TUBAL LIGATION    . VIDEO BRONCHOSCOPY WITH ENDOBRONCHIAL ULTRASOUND N/A 12/22/2019   Procedure: VIDEO BRONCHOSCOPY WITH ENDOBRONCHIAL ULTRASOUND;  Surgeon: Collene Gobble, MD;  Location: [facility];  Service: Pulmonary;  Laterality: N/A;    Family History  Problem Relation Age of Onset  . Alcohol abuse Mother   . Cancer Mother 55       lung  . Migraines Mother   . Cancer Father 55       prostate  . Diabetes Father   . Alcohol abuse Brother   . Cancer Brother        skin  . Cancer Daughter        breast  . Breast cancer Daughter 23  . Bladder Cancer Neg Hx   . Kidney cancer Neg Hx     Social History:  reports that she quit smoking about 8 years ago. Her smoking use included cigarettes. She has a 10.00 pack-year smoking history. She has never used smokeless tobacco. She reports current  alcohol use of about 5.0 standard drinks of alcohol per week. She reports that she does not use drugs.  The patient denies any exposure to radiation or toxins.  The patient lives in [place].  She is alone today.  Allergies:  Allergies  Allergen Reactions  . Zostavax [Zoster Vaccine Live] Rash  . Penicillins Hives    Did it involve swelling of the face/tongue/throat, SOB, or low BP? No Did it involve sudden or severe rash/hives, skin peeling, or any reaction on the inside of your mouth or nose? Yes Did you need to seek medical attention at a hospital or doctor's office? Yes When did it last happen?14 or 15 If all above answers are "NO", may proceed with cephalosporin use.   . Naproxen Hives  . Zoloft [Sertraline Hcl] Hives  . Latex Itching and Rash    Condoms    Medications Prior to Admission  Medication Sig Dispense Refill  . ALPRAZolam (XANAX) 0.5 MG tablet Take 3 tablets (1.5 mg  total) by mouth at bedtime.    . calcium-vitamin D (OSCAL WITH D) 500-200 MG-UNIT tablet Take 1 tablet by mouth daily.    . famotidine (PEPCID) 20 MG tablet One after supper (Patient taking differently: Take 20 mg by mouth daily. One after supper) 30 tablet 11  . Multiple Vitamin (MULTIVITAMIN WITH MINERALS) TABS tablet Take 1 tablet by mouth daily.    . pantoprazole (PROTONIX) 40 MG tablet Take 1 tablet (40 mg total) by mouth daily. Take 30-60 min before first meal of the day 30 tablet 2  . pramipexole (MIRAPEX) 1 MG tablet TAKE 1 TABLET BY MOUTH AT BEDTIME (Patient taking differently: Take 1 mg by mouth at bedtime. ) 30 tablet 12  . sulfamethoxazole-trimethoprim (BACTRIM DS) 800-160 MG tablet Take 1 tablet by mouth 2 (two) times daily. 14 tablet 0  . tamoxifen (NOLVADEX) 20 MG tablet Take 1 tablet (20 mg total) by mouth daily. 90 tablet 3  . clindamycin (CLEOCIN) 150 MG capsule Take 450 mg by mouth See admin instructions. Take before dental procedures     . ivabradine (CORLANOR) 5 MG TABS tablet Take 2 hours prior to cardiac CT along with metoprolol (159m tablet) 2 tablet 0  . metoprolol tartrate (LOPRESSOR) 100 MG tablet Take 1 tablet (100 mg total) by mouth once for 1 dose. Take 2 hours prior to your CT scan. 1 tablet 0  . nitroGLYCERIN (NITROSTAT) 0.4 MG SL tablet Place 1 tablet (0.4 mg total) under the tongue every 5 (five) minutes as needed for chest pain. Go to ER if third tablet is necessary 30 tablet 5  . ondansetron (ZOFRAN) 4 MG tablet Take 1 tablet (4 mg total) by mouth every 8 (eight) hours as needed for nausea or vomiting. (Patient not taking: Reported on 05/12/2020) 20 tablet 0  . prochlorperazine (COMPAZINE) 10 MG tablet Take 1 tablet (10 mg total) by mouth every 6 (six) hours as needed for nausea or vomiting. (Patient not taking: Reported on 05/12/2020) 30 tablet 0  . valACYclovir (VALTREX) 500 MG tablet Take 1 tablet (500 mg total) by mouth daily as needed (Cold sores).      Review  of Systems: GENERAL:  Feels "not too bad".  No fevers, sweats.  Weight gain. PERFORMANCE STATUS (ECOG):  1 HEENT:  Vision slightly blurry.  No runny nose, sore throat, mouth sores or tenderness. Lungs: Shortness of breath.  Cough productive of slight phlegm.  No hemoptysis. Cardiac:  No chest pain, palpitations, orthopnea, or PND. GI:  No nausea, vomiting, diarrhea, constipation, melena or hematochezia. GU:  No urgency, frequency, dysuria, or hematuria. Musculoskeletal:  No back pain.  No joint pain.  No muscle tenderness. Extremities:  Right ankle edema, improving on Eliquis.  No pain. Skin:  No rashes or skin changes. Neuro:  No headache, numbness or weakness, balance or coordination issues. Endocrine:  No diabetes, thyroid issues, hot flashes or night sweats. Psych:  Anxious. Pain:  No focal pain. Review of systems:  All other systems reviewed and found to be negative.  Physical Exam:  Blood pressure (!) 108/49, pulse 97, temperature 97.7 F (36.5 C), temperature source Oral, resp. rate 18, height 5' 2" (1.575 m), weight 174 lb (78.9 kg), SpO2 96 %.  GENERAL:  Well developed, well nourished, woman sitting comfortably on the medical unit in no acute distress. MENTAL STATUS:  Alert and oriented to person, place and time. HEAD:  Dark hair pulled up.  Normocephalic, atraumatic, face symmetric, no Cushingoid features. EYES:  Brown eyes.  Pupils equal round and reactive to light and accomodation.  No conjunctivitis or scleral icterus. ENT:  [place] in place.  Oropharynx clear without lesion.  Tongue normal. Mucous membranes moist.  RESPIRATORY:  Speaks in short phrases secondary to shortness of breath.  Decreased breath sounds left lower lobe.  No rales, wheezes or rhonchi. CARDIOVASCULAR:  Regular rate and rhythm without murmur, rub or gallop. ABDOMEN:  Soft, non-tender, with active bowel sounds, and no hepatosplenomegaly.  No masses. SKIN:  No rashes, ulcers or lesions. EXTREMITIES: Trace  right ankle edema.  No skin discoloration or tenderness.  No palpable cords. LYMPH NODES: No palpable cervical, supraclavicular, axillary or inguinal adenopathy  NEUROLOGICAL: Unremarkable. PSYCH:  Appropriate.   Results for orders placed or performed during the hospital encounter of 05/12/20 (from the past 48 hour(s))  Troponin I (High Sensitivity)     Status: None   Collection Time: 05/13/20  6:30 PM  Result Value Ref Range   Troponin I (High Sensitivity) <2 <18 ng/L    Comment: (NOTE) Elevated high sensitivity troponin I (hsTnI) values and significant  changes across serial measurements may suggest ACS but many other  chronic and acute conditions are known to elevate hsTnI results.  Refer to the "Links" section for chest pain algorithms and additional  guidance. Performed at [facility], [address] [place], [place] [address]   CBC     Status: Abnormal   Collection Time: 05/14/20  5:47 AM  Result Value Ref Range   WBC 11.1 (H) 4.0 - 10.5 K/uL   RBC 4.15 3.87 - 5.11 MIL/uL   Hemoglobin 12.2 12.0 - 15.0 g/dL   HCT 37.6 36 - 46 %   MCV 90.6 80.0 - 100.0 fL   MCH 29.4 26.0 - 34.0 pg   MCHC 32.4 30.0 - 36.0 g/dL   RDW 13.2 11.5 - 15.5 %   Platelets 220 150 - 400 K/uL   nRBC 0.0 0.0 - 0.2 %    Comment: Performed at [facility], [address] [place], [place] [address]  Comprehensive metabolic panel     Status: Abnormal   Collection Time: 05/14/20  5:47 AM  Result Value Ref Range   Sodium 135 135 - 145 mmol/L   Potassium 4.0 3.5 - 5.1 mmol/L   Chloride 102 98 - 111 mmol/L   CO2 24 22 - 32 mmol/L   Glucose, Bld 115 (H) 70 - 99 mg/dL    Comment: Glucose reference range applies only to  samples taken after fasting for at least 8 hours.   BUN 11 8 - 23 mg/dL   Creatinine, Ser 0.54 0.44 - 1.00 mg/dL   Calcium 8.3 (L) 8.9 - 10.3 mg/dL   Total Protein 6.3 (L) 6.5 - 8.1 g/dL   Albumin 3.4 (L) 3.5 - 5.0 g/dL   AST 29 15 - 41 U/L   ALT 46 (H) 0 - 44 U/L    Alkaline Phosphatase 39 38 - 126 U/L   Total Bilirubin 1.1 0.3 - 1.2 mg/dL   GFR calc non Af Amer >60 >60 mL/min   GFR calc Af Amer >60 >60 mL/min   Anion gap 9 5 - 15    Comment: Performed at [facility], [address] [place], [place] [address]  Lactate dehydrogenase     Status: None   Collection Time: 05/14/20  1:48 PM  Result Value Ref Range   LDH 163 98 - 192 U/L    Comment: Performed at [facility], [address] [place], [place] [address]  CBC     Status: Abnormal   Collection Time: 05/15/20  4:00 AM  Result Value Ref Range   WBC 8.4 4.0 - 10.5 K/uL   RBC 3.91 3.87 - 5.11 MIL/uL   Hemoglobin 11.6 (L) 12.0 - 15.0 g/dL   HCT 34.7 (L) 36 - 46 %   MCV 88.7 80.0 - 100.0 fL   MCH 29.7 26.0 - 34.0 pg   MCHC 33.4 30.0 - 36.0 g/dL   RDW 13.2 11.5 - 15.5 %   Platelets 192 150 - 400 K/uL   nRBC 0.0 0.0 - 0.2 %    Comment: Performed at [facility], [address] [place], [place] [address]   DG Chest 2 View  Result Date: 05/15/2020 CLINICAL DATA:  Chest pain and dyspnea, recent left thoracentesis EXAM: CHEST - 2 VIEW COMPARISON:  05/13/2020 chest radiograph. FINDINGS: Stable cardiomediastinal silhouette with normal heart size. No pneumothorax. Small left pleural effusion is mildly increased. No significant right pleural effusion. Increased left lung base opacity. IMPRESSION: 1. No pneumothorax. 2. Small left pleural effusion, mildly increased. Increased left lung base opacity, favor atelectasis. Electronically Signed   By: Ilona Sorrel M.D.   On: 05/15/2020 11:20   US Venous Img Lower Bilateral (DVT)  Result Date: 05/14/2020 CLINICAL DATA:  69 year old with history of lung cancer and small pulmonary emboli on recent CTA. EXAM: BILATERAL LOWER EXTREMITY VENOUS DOPPLER ULTRASOUND TECHNIQUE: Gray-scale sonography with graded compression, as well as color Doppler and duplex ultrasound were performed to evaluate the lower extremity deep venous systems from  the level of the common femoral vein and including the common femoral, femoral, profunda femoral, popliteal and calf veins including the posterior tibial, peroneal and gastrocnemius veins when visible. The superficial great saphenous vein was also interrogated. Spectral Doppler was utilized to evaluate flow at rest and with distal augmentation maneuvers in the common femoral, femoral and popliteal veins. COMPARISON:  None. FINDINGS: RIGHT LOWER EXTREMITY Common Femoral Vein: No evidence of thrombus. Normal compressibility, respiratory phasicity and response to augmentation. Saphenofemoral Junction: No evidence of thrombus. Normal compressibility and flow on color Doppler imaging. Profunda Femoral Vein: No evidence of thrombus. Normal compressibility and flow on color Doppler imaging. Femoral Vein: No evidence of thrombus. Normal compressibility, respiratory phasicity and response to augmentation. Popliteal Vein: No evidence of thrombus. Normal compressibility, respiratory phasicity and response to augmentation. Calf Veins: Positive for thrombus. Paired peroneal veins are not compressible and do not  have color Doppler flow. Posterior tibial veins are compressible with color Doppler flow. LEFT LOWER EXTREMITY Common Femoral Vein: No evidence of thrombus. Normal compressibility, respiratory phasicity and response to augmentation. Saphenofemoral Junction: No evidence of thrombus. Normal compressibility and flow on color Doppler imaging. Profunda Femoral Vein: No evidence of thrombus. Normal compressibility and flow on color Doppler imaging. Femoral Vein: No evidence of thrombus. Normal compressibility, respiratory phasicity and response to augmentation. Popliteal Vein: No evidence of thrombus. Normal compressibility, respiratory phasicity and response to augmentation. Calf Veins: No evidence of thrombus. Normal compressibility and flow on color Doppler imaging. Other Findings:  None. IMPRESSION: 1. Positive for deep  venous thrombosis in the right calf. Thrombus involving right peroneal veins. 2. Negative for deep venous thrombosis in the left lower extremity. Electronically Signed   By: Markus Daft M.D.   On: 05/14/2020 16:02    Assessment:  The patient is a 69 y.o. woman with history of stage IA right breast cancer (2020) and stage IIIA right lung cancer (2021).  She presented with progressive shortness of breath and a large left pleural effusion.  Thoracentesis on 05/13/2020 revealed 1.5 L of blood-tinged pleural fluid.    She was diagnosed with right breast cancer.  She underwent lumpectomy and sentinel lymph node biopsy on 05/08/2019.  Pathologyrevealed a 1.1 cm grade IIinvasive mammary carcinoma with lobular features and adjacent DCIS.   OncotypeDXtestingrevealed a score of 10 (low risk).  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  She received concurrent chemotherapy (carboplatin and Taxol x 5 weekly cycles) and a truncated course of radiation (12 fractions: 01/14/2020 - 01/28/2020).  Chemotherapy completed on 02/09/2020.  She has received consolidative immunotherapy with Imfinzi (durvalumab) from 03/03/2020 - 04/28/2020.   Chest CT angiogram on 05/12/2020 revealed a very small amount of acute pulmonary embolism within a lower lobe branch of the right pulmonary artery.  There was moderate to marked consolidation throughout the LLL with a moderate size left pleural effusion.  There were small slightly patchy focal areas of atelectasis and/or infiltrate within the anterolateral aspect of the RUL and posterior medial aspect of the RLL.  There was a 6 mm noncalcified lung nodule within the anterior aspect of the LUL.  There was stable well-defined areas of low-attenuation within the left lobe of the liver (possible hemangiomas).  Bilateral lower extremity duplex on 05/14/2020 revealed a DVT  in the right calf.  There was thrombus involving the right peroneal veins.  There was no DVT in the left lower extremity.  She is on Eliquis.  Symptomatically, she is short of breath with conversation.  She denies any pain.  Exam reveals decreased breath sounds in the left lower lobe and trace right ankle edema.  Plan:   1.   Left pleural effusion  Discuss concern for malignant effusion.  Cytology pending.  Discuss plan for follow-up PET scan in the outpatient department.  Discuss plan for possible future biopsy if cytology is negative and PET scan suggestive of metastatic disease. 2.   Pulmonary embolism and RLE DVT  Concern for malignancy induced thrombosis.  Risk of thromboembolism with tamoxifen approximately 5%.  Continue Eliquis. 3.   Disposition  Anticipate discharge in 1-2 days.  Thank you for allowing me to participate in [place] 's care.  I will follow her closely with you while hospitalized.  Dr Curt Bears will follow her after discharge in the outpatient department.  Lequita Asal, MD  05/15/2020, 6:07 PM

## 2020-05-15 NOTE — Progress Notes (Signed)
PROGRESS NOTE    Elizabeth [name]  TGG:[id number] DOB: 1950/11/16 DOA: 05/12/2020 PCP: Birdie Sons, MD    Brief Narrative:  HPI: Elizabeth [name] is a 69 y.o. female with medical history significant of intermittent shortness of breath with dyspnea of exertion and orthopnea since middle of July of this year.  Patient has been under evaluation for her shortness of breath with her oncologist and an echocardiogram is scheduled.  When patient noticed that she is having worsening shortness of breath with shorter distances she relayed this information to the nurse and was advised to come to the emergency room.  Oncologist note reviewed with recommendations for evaluation of immunotherapy mediated pneumonitis.  Discussed with patient that her shortness of breath is most likely due to the moderate-sized pleural effusion found on chest x-ray however would like to pursue CT scan of her chest with contrast to identify any pulmonary embolism but also for any malignancy, as she is at risk for malignancies patient has a history of breast and lung cancer.  Also at bedside both agree with plan of care all questions answered review of systems is negative for any other symptoms.  ED Course:  In the emergency room patient is alert awake and oriented on 2 L of nasal cannula with O2 sats above 90% patient is comfortable not tachypneic and not tachycardic.  She is giving history. Labs are stable.  An EKG done is sinus rhythm with a heart rate of 89.  Chest x-ray shows worsening interstitial opacities and a new moderate left pleural effusion.   Assessment & Plan:   Principal Problem:   Respiratory failure with hypoxia (HCC) Active Problems:   Mediastinal adenopathy   Adenocarcinoma of right lung, stage 3 (HCC)   Major depressive disorder, single episode   DOE (dyspnea on exertion)  Problem Assessment/Plan Respiratory failure with hypoxia (HCC)/Pulmonary embolism  Overview Patient is a  69 year old Caucasian female with past medical history of tobacco abuse and lung cancer currently receiving chemotherapy, presenting to the emergency room with progressive shortness of breath and hypoxia.  Review shows that patient is currently under work-up for dyspnea on exertion and has an echocardiogram pending.  However I suspect this is related to her moderate sized pleural effusion in the left side.  We will proceed with CT imaging for further detailed evaluation patient also has a history of spinal adenopathy which was attributed to her history of breast cancer that is being also followed by oncology. We will start appropriate and indicated therapy once we have results of CTA of the chest. Discussed with patient that we will consider thoracentesis if indicated. We will give patient 1 dose of diuretic therapy.  CTA chest showed RLL PE and after d/w Dr.Shick radiologist it is very tiny, therefore we will not Treat. We will proceed with thoracentesis. Pt anxious and we will get valium prior to her procedure. Pt s/p Left lung thoracentesis and removal of 1.5L blood tinged pleural fluid and pt tolerated procedure well. She is on 2 L -3L [place] and is stable and comfortable. We will start pt on eliquis as it has been 24 hour post procedure and 10 mg bid x 1 week followed by 5 mg bid. LE venous dopplers pending. By Waunita Schooner criteria pleural fluid is exudative and I suspect it is related to her malignancy, afb smear, Cytology is pending> will start on empiric abx today with Levaquin due to pcn allergy.   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 Hypermetabolic paratracheal, right hilar, and subcarinal adenopathy, suspicious for active malignancy Repeat CT shows: moderate severity left hilar lymphadenopathy. The largest lymph node  measures approximately 2.0 cm. Mild mediastinal lymphadenopathy. D/W pt about plan for oncology eval , cytology is pending.   Adenocarcinoma of right lung, stage 3 [facility]) Assessment & Plan Patient is currently receiving immunotherapy with oncologist Dr. Julien Nordmann. Inpatient onc con requested, suspect pleural effusion to be malignant.   Major depressive disorder, single episode Continue patient's home regimen with alprazolam.       DVT prophylaxis: SCD's Code Status: FULL Code Family Communication:  Spouse . Disposition Plan: Home with home PT.   Consultants:  Cardiology. IR. Oncology.  Procedures: Left lung thoracentesis.   Subjective: Pt seen today and is feeling sob and is on oxygen 2LNC and is able to answer questions. D/W her about plan for today, she is found to have have very small insignificant pe and  We do not need to continued with blood thinners. D/w her about thoracentesis . Overnight pt was given ivf for hydration and aki but still did not feel better, states her hr was 140 at night and she was having chest pain or palpitation.   Pt seen today she is feeling much better with removal of fluid.    Objective: Vitals:   05/15/20 0449 05/15/20 0755 05/15/20 0829 05/15/20 1300  BP: (!) 112/56  (!) 108/49   Pulse: 100  97   Resp: 17  18   Temp: 98.3 F (36.8 C)  97.7 F (36.5 C)   TempSrc: Oral  Oral   SpO2: 93% 94% 93% 96%  Weight:      Height:        Intake/Output Summary (Last 24 hours) at 05/15/2020 1614 Last data filed at 05/15/2020 0958 Gross per 24 hour  Intake 1083 ml  Output --  Net 1083 ml   Filed Weights   05/12/20 1641  Weight: 78.9 kg    Examination: Blood pressure (!) 108/49, pulse 97, temperature 97.7 F (36.5 C), temperature source Oral, resp. rate 18, height 5\' 2"  (1.575 m), weight 78.9 kg, SpO2 96 %.   General exam: Appears calm and comfortable  Respiratory system: Clear to auscultation. Respiratory effort normal. Cardiovascular  system: S1 & S2 heard, RRR. No JVD, murmurs, rubs, gallops or clicks. No pedal edema. Gastrointestinal system: Abdomen is nondistended, soft and nontender. No organomegaly or masses felt. Normal bowel sounds heard. Central nervous system: Alert and oriented. No focal neurological deficits. Extremities: Symmetric 5 x 5 power. Skin: No rashes, lesions or ulcers Psychiatry: Judgement and insight appear normal. Mood & affect appropriate.   Data Reviewed: I have personally reviewed following labs and imaging studies  CBC: Recent Labs  Lab 05/12/20 1659 05/13/20 0434 05/14/20 0547 05/15/20 0400  WBC 8.7 8.1 11.1* 8.4  NEUTROABS 6.7  --   --   --   HGB 12.8 12.5 12.2 11.6*  HCT 39.0 37.8 37.6 34.7*  MCV 90.3 89.4 90.6 88.7  PLT 258 237 220 683   Basic Metabolic Panel: Recent Labs  Lab 05/12/20 1659 05/13/20 0434 05/14/20 0547  NA 138 138 135  K 4.1 4.0 4.0  CL 108 107 102  CO2 21* 23 24  GLUCOSE 119* 101* 115*  BUN 16 13 11   CREATININE 0.53 0.58 0.54  CALCIUM 8.4* 8.2* 8.3*   GFR: Estimated Creatinine Clearance: 66.4 mL/min (by C-G formula based on SCr of 0.54 mg/dL). Liver Function Tests: Recent Labs  Lab 05/12/20 1659 05/13/20 0434 05/14/20 0547  AST 25 21 29   ALT 35 31 46*  ALKPHOS 39 38 39  BILITOT 0.8 0.7 1.1  PROT 6.7 6.4* 6.3*  ALBUMIN 3.6 3.4* 3.4*   No results for input(s): LIPASE, AMYLASE in the last 168 hours. No results for input(s): AMMONIA in the last 168 hours. Coagulation Profile: Recent Labs  Lab 05/12/20 2245 05/13/20 0434  INR 1.0 1.0   Cardiac Enzymes: No results for input(s): CKTOTAL, CKMB, CKMBINDEX, TROPONINI in the last 168 hours. BNP (last 3 results) No results for input(s): PROBNP in the last 8760 hours. HbA1C: Recent Labs    05/13/20 0434  HGBA1C 5.2   CBG: No results for input(s): GLUCAP in the last 168 hours. Lipid Profile: No results for input(s): CHOL, HDL, LDLCALC, TRIG, CHOLHDL, LDLDIRECT in the last 72  hours. Thyroid Function Tests: Recent Labs    05/12/20 1659  TSH 1.561  FREET4 1.01   Anemia Panel: No results for input(s): VITAMINB12, FOLATE, FERRITIN, TIBC, IRON, RETICCTPCT in the last 72 hours. Sepsis Labs: Recent Labs  Lab 05/12/20 1659  LATICACIDVEN 1.3    Recent Results (from the past 240 hour(s))  SARS Coronavirus 2 by RT PCR (hospital order, performed in [facility] hospital lab) Nasopharyngeal Nasopharyngeal Swab     Status: None   Collection Time: 05/12/20  5:48 PM   Specimen: Nasopharyngeal Swab  Result Value Ref Range Status   SARS Coronavirus 2 NEGATIVE NEGATIVE Final    Comment: (NOTE) SARS-CoV-2 target nucleic acids are NOT DETECTED.  The SARS-CoV-2 RNA is generally detectable in upper and lower respiratory specimens during the acute phase of infection. The lowest concentration of SARS-CoV-2 viral copies this assay can detect is 250 copies / mL. A negative result does not preclude SARS-CoV-2 infection and should not be used as the sole basis for treatment or other patient management decisions.  A negative result may occur with improper specimen collection / handling, submission of specimen other than nasopharyngeal swab, presence of viral mutation(s) within the areas targeted by this assay, and inadequate number of viral copies (<250 copies / mL). A negative result must be combined with clinical observations, patient history, and epidemiological information.  Fact Sheet for Patients:   StrictlyIdeas.no  Fact Sheet for Healthcare Providers: BankingDealers.co.za  This test is not yet approved or  cleared by the [place] FDA and has been authorized for detection and/or diagnosis of SARS-CoV-2 by FDA under an Emergency Use Authorization (EUA).  This EUA will remain in effect (meaning this test can be used) for the duration of the COVID-19 declaration under Section 564(b)(1) of the Act, 21 U.S.C. section  360bbb-3(b)(1), unless the authorization is terminated or revoked sooner.  Performed at [facility], [address] [place], [place] [address]   Body fluid culture     Status: None (Preliminary result)   Collection Time: 05/13/20  3:20 PM   Specimen: PATH Cytology Pleural fluid  Result Value Ref Range Status   Specimen Description   Final    PLEURAL Performed at [facility], [address], [place], [place] [address]    Special Requests   Final    NONE Performed at [facility], [address] [place], [place] [address]    Gram Stain   Final    NO WBC  SEEN FEW YEAST CRITICAL RESULT CALLED TO, READ BACK BY AND VERIFIED WITH: A GLADENY RN 05/14/20 JDW Performed at [facility], [address] [place], [place] [address]    Culture FEW YEAST  Final   Report Status PENDING  Incomplete  Acid Fast Smear (AFB)     Status: None   Collection Time: 05/13/20  3:20 PM   Specimen: PATH Cytology Pleural fluid  Result Value Ref Range Status   AFB Specimen Processing Concentration  Final   Acid Fast Smear Negative  Final    Comment: (NOTE) Performed At: [facility] [address] [place], [place] [id number] Rush Farmer MD XB:[id number]    Source (AFB) PLEURAL  Final    Comment: Performed at [name], [address] [place], [place] [address]    Radiology Studies: DG Chest 2 View  Result Date: 05/15/2020 CLINICAL DATA:  Chest pain and dyspnea, recent left thoracentesis EXAM: CHEST - 2 VIEW COMPARISON:  05/13/2020 chest radiograph. FINDINGS: Stable cardiomediastinal silhouette with normal heart size. No pneumothorax. Small left pleural effusion is mildly increased. No significant right pleural effusion. Increased left lung base opacity. IMPRESSION: 1. No pneumothorax. 2. Small left pleural effusion, mildly increased. Increased left lung base opacity, favor atelectasis. Electronically Signed   By: Ilona Sorrel M.D.   On:  05/15/2020 11:20   US Venous Img Lower Bilateral (DVT)  Result Date: 05/14/2020 CLINICAL DATA:  69 year old with history of lung cancer and small pulmonary emboli on recent CTA. EXAM: BILATERAL LOWER EXTREMITY VENOUS DOPPLER ULTRASOUND TECHNIQUE: Gray-scale sonography with graded compression, as well as color Doppler and duplex ultrasound were performed to evaluate the lower extremity deep venous systems from the level of the common femoral vein and including the common femoral, femoral, profunda femoral, popliteal and calf veins including the posterior tibial, peroneal and gastrocnemius veins when visible. The superficial great saphenous vein was also interrogated. Spectral Doppler was utilized to evaluate flow at rest and with distal augmentation maneuvers in the common femoral, femoral and popliteal veins. COMPARISON:  None. FINDINGS: RIGHT LOWER EXTREMITY Common Femoral Vein: No evidence of thrombus. Normal compressibility, respiratory phasicity and response to augmentation. Saphenofemoral Junction: No evidence of thrombus. Normal compressibility and flow on color Doppler imaging. Profunda Femoral Vein: No evidence of thrombus. Normal compressibility and flow on color Doppler imaging. Femoral Vein: No evidence of thrombus. Normal compressibility, respiratory phasicity and response to augmentation. Popliteal Vein: No evidence of thrombus. Normal compressibility, respiratory phasicity and response to augmentation. Calf Veins: Positive for thrombus. Paired peroneal veins are not compressible and do not have color Doppler flow. Posterior tibial veins are compressible with color Doppler flow. LEFT LOWER EXTREMITY Common Femoral Vein: No evidence of thrombus. Normal compressibility, respiratory phasicity and response to augmentation. Saphenofemoral Junction: No evidence of thrombus. Normal compressibility and flow on color Doppler imaging. Profunda Femoral Vein: No evidence of thrombus. Normal compressibility and  flow on color Doppler imaging. Femoral Vein: No evidence of thrombus. Normal compressibility, respiratory phasicity and response to augmentation. Popliteal Vein: No evidence of thrombus. Normal compressibility, respiratory phasicity and response to augmentation. Calf Veins: No evidence of thrombus. Normal compressibility and flow on color Doppler imaging. Other Findings:  None. IMPRESSION: 1. Positive for deep venous thrombosis in the right calf. Thrombus involving right peroneal veins. 2. Negative for deep venous thrombosis in the left lower extremity. Electronically Signed   By: Markus Daft M.D.   On: 05/14/2020 16:02    Scheduled Meds: . albuterol  2.5  mg Nebulization TID  . apixaban  10 mg Oral BID   Followed by  . [START ON 05/21/2020] apixaban  5 mg Oral BID  . calcium-vitamin D  1 tablet Oral Daily  . guaiFENesin  600 mg Oral BID  . polyethylene glycol  17 g Oral Daily  . pramipexole  1 mg Oral QHS  . sodium chloride flush  3 mL Intravenous Once  . sodium chloride flush  3 mL Intravenous Q12H  . tamoxifen  20 mg Oral Daily   Continuous Infusions: . sodium chloride      Para Skeans, MD [facility] Pager [phone] If 7PM-7AM, please contact night-coverage [email or web address] Password [facility] 05/15/2020, 4:14 PM

## 2020-05-15 NOTE — Plan of Care (Signed)
Patient A/Ox4 can verbally respond with clear speech and ambulatory up ab lib. Continent of B/B. Skin warm, dry, and intact with no new s/s of impairment noted. Tolerated all scheduled medication well, no adverse reactions, or side effects noted. Medicated x1 with Xanax 1.5 mg for anxiety, effective within one hour. Denies discomfort, distress, and pain at this time. Resting well in bed with no issues noted. Will continue to monitor.

## 2020-05-16 ENCOUNTER — Other Ambulatory Visit: Payer: Self-pay | Admitting: Hematology and Oncology

## 2020-05-16 DIAGNOSIS — C3491 Malignant neoplasm of unspecified part of right bronchus or lung: Secondary | ICD-10-CM

## 2020-05-16 DIAGNOSIS — Z170 Estrogen receptor positive status [ER+]: Secondary | ICD-10-CM

## 2020-05-16 DIAGNOSIS — C50411 Malignant neoplasm of upper-outer quadrant of right female breast: Secondary | ICD-10-CM

## 2020-05-16 DIAGNOSIS — I2693 Single subsegmental pulmonary embolism without acute cor pulmonale: Secondary | ICD-10-CM

## 2020-05-16 DIAGNOSIS — J910 Malignant pleural effusion: Secondary | ICD-10-CM

## 2020-05-16 LAB — COMP PANEL: LEUKEMIA/LYMPHOMA

## 2020-05-16 LAB — BODY FLUID CULTURE: Gram Stain: NONE SEEN

## 2020-05-16 LAB — PH, BODY FLUID: pH, Body Fluid: 7.5

## 2020-05-16 MED ORDER — ALBUTEROL SULFATE HFA 108 (90 BASE) MCG/ACT IN AERS
2.0000 | INHALATION_SPRAY | Freq: Four times a day (QID) | RESPIRATORY_TRACT | 2 refills | Status: AC | PRN
Start: 2020-05-16 — End: ?

## 2020-05-16 MED ORDER — APIXABAN 5 MG PO TABS: 5.0000 mg | ORAL_TABLET | Freq: Two times a day (BID) | ORAL | 2 refills | Status: DC

## 2020-05-16 MED ORDER — APIXABAN 5 MG PO TABS: 10.0000 mg | ORAL_TABLET | Freq: Two times a day (BID) | ORAL | 0 refills | Status: DC

## 2020-05-16 NOTE — Discharge Instructions (Signed)
Information on my medicine - ELIQUIS (apixaban)  This medication education was reviewed with me or my healthcare representative as part of my discharge preparation.    Why was Eliquis prescribed for you? Eliquis was prescribed to treat blood clots that may have been found in the veins of your legs (deep vein thrombosis) or in your lungs (pulmonary embolism) and to reduce the risk of them occurring again.  What do You need to know about Eliquis ? The starting dose is 10 mg (two 5 mg tablets) taken TWICE daily for the FIRST SEVEN (7) DAYS, then on 05/21/2020 the dose is reduced to ONE 5 mg tablet taken TWICE daily.  Eliquis may be taken with or without food.   Try to take the dose about the same time in the morning and in the evening. If you have difficulty swallowing the tablet whole please discuss with your pharmacist how to take the medication safely.  Take Eliquis exactly as prescribed and DO NOT stop taking Eliquis without talking to the doctor who prescribed the medication.  Stopping may increase your risk of developing a new blood clot.  Refill your prescription before you run out.  After discharge, you should have regular check-up appointments with your healthcare provider that is prescribing your Eliquis.    What do you do if you miss a dose? If a dose of ELIQUIS is not taken at the scheduled time, take it as soon as possible on the same day and twice-daily administration should be resumed. The dose should not be doubled to make up for a missed dose.  Important Safety Information A possible side effect of Eliquis is bleeding. You should call your healthcare provider right away if you experience any of the following: ? Bleeding from an injury or your nose that does not stop. ? Unusual colored urine (red or dark brown) or unusual colored stools (red or black). ? Unusual bruising for unknown reasons. ? A serious fall or if you hit your head (even if there is no bleeding).  Some  medicines may interact with Eliquis and might increase your risk of bleeding or clotting while on Eliquis. To help avoid this, consult your healthcare provider or pharmacist prior to using any new prescription or non-prescription medications, including herbals, vitamins, non-steroidal anti-inflammatory drugs (NSAIDs) and supplements.  This website has more information on Eliquis (apixaban): http://www.eliquis.com/eliquis/home

## 2020-05-16 NOTE — Care Management Important Message (Signed)
Important Message  Patient Details  Name: Elizabeth [name] MRN: [id number] Date of Birth: 1950-12-30   Medicare Important Message Given:  Yes     Loann Quill 05/16/2020, 1:15 PM

## 2020-05-16 NOTE — Discharge Summary (Signed)
Physician Discharge Summary  Elizabeth [name] PPI:[id number] DOB: 02-02-1951 DOA: 05/12/2020  PCP: Birdie Sons, MD  Admit date: 05/12/2020 Discharge date: 05/16/2020  Admitted From: Home Disposition: Home  Recommendations for Outpatient Follow-up:  1. Follow up with PCP in 1-2 weeks 2. Follow-up with oncology as a scheduled.  They will receive your pathology reports.  Home Health: Not applicable Equipment/Devices: Not needed  Discharge Condition: Stable CODE STATUS: Full code Diet recommendation: Regular diet  Discharge summary: 69 year old female with history of stage Ia invasive breast cancer on tamoxifen, status post radical lumpectomy, radiation.  Recently diagnosed lung cancer on immunotherapy presented to the emergency room with gradually worsening shortness of breath and left-sided chest pain.  In the emergency room, a CTA of the chest showed acute pulmonary embolism within lower lobe branch of the right pulmonary artery, very small in size.  Left lower lobe consolidation.  Left-sided pleural effusion.  Lower extremity duplexes show DVT in the right calf, thrombus involving right peroneal veins.  Patient was with significant shortness of breath and needing supplemental oxygen so she was admitted to the hospital.  Acute respiratory failure with hypoxia/suspected malignant left-sided pleural effusion unknown due to breast cancer or lung cancer/small pulmonary embolism/right peroneal vein DVT: Thoracentesis left lung, 1.5 L blood-tinged pleural fluid, exudate, no bacterial growth and consistent with malignant pleural effusion.  Clinically improved.  Cytology pending.  Seen by oncology.  They will follow up. Currently on room air, ambulated with no drop in oxygen.  Treated with broad-spectrum antibiotics for 3 days, will discontinue. Pulmonary embolism is very small, however patient has active lung cancer and also has lower extremity DVT.  Benefits outweigh risk of  anticoagulation.  Patient will be started on Eliquis full dose and follow-up with her hematology oncology.    Discharge Diagnoses:  Principal Problem:   Respiratory failure with hypoxia ([place]) Active Problems:   Major depressive disorder, single episode   Mediastinal adenopathy   Adenocarcinoma of right lung, stage 3 (HCC)   DOE (dyspnea on exertion)   Pleural effusion on left   Pulmonary embolism [facility])    Discharge Instructions  Discharge Instructions    Call MD for:  difficulty breathing, headache or visual disturbances   Complete by: As directed    Call MD for:  temperature >100.4   Complete by: As directed    Diet general   Complete by: As directed    Increase activity slowly   Complete by: As directed      Allergies as of 05/16/2020      Reactions   Zostavax [zoster Vaccine Live] Rash   Penicillins Hives   Did it involve swelling of the face/tongue/throat, SOB, or low BP? No Did it involve sudden or severe rash/hives, skin peeling, or any reaction on the inside of your mouth or nose? Yes Did you need to seek medical attention at a hospital or doctor's office? Yes When did it last happen?14 or 15 If all above answers are "NO", may proceed with cephalosporin use.   Naproxen Hives   Zoloft [sertraline Hcl] Hives   Latex Itching, Rash   Condoms      Medication List    STOP taking these medications   ondansetron 4 MG tablet Commonly known as: ZOFRAN   prochlorperazine 10 MG tablet Commonly known as: COMPAZINE   sulfamethoxazole-trimethoprim 800-160 MG tablet Commonly known as: BACTRIM DS     TAKE these medications   ALPRAZolam 0.5 MG tablet Commonly known as: XANAX Take 3 tablets (  1.5 mg total) by mouth at bedtime.   apixaban 5 MG Tabs tablet Commonly known as: ELIQUIS Take 2 tablets (10 mg total) by mouth 2 (two) times daily for 5 days.   apixaban 5 MG Tabs tablet Commonly known as: ELIQUIS Take 1 tablet (5 mg total) by mouth 2 (two) times  daily. Start taking on: May 22, 2020   calcium-vitamin D 500-200 MG-UNIT tablet Commonly known as: OSCAL WITH D Take 1 tablet by mouth daily.   clindamycin 150 MG capsule Commonly known as: CLEOCIN Take 450 mg by mouth See admin instructions. Take before dental procedures   famotidine 20 MG tablet Commonly known as: Pepcid One after supper What changed:   how much to take  how to take this  when to take this   ivabradine 5 MG Tabs tablet Commonly known as: CORLANOR Take 2 hours prior to cardiac CT along with metoprolol (100mg  tablet)   metoprolol tartrate 100 MG tablet Commonly known as: LOPRESSOR Take 1 tablet (100 mg total) by mouth once for 1 dose. Take 2 hours prior to your CT scan.   multivitamin with minerals Tabs tablet Take 1 tablet by mouth daily.   nitroGLYCERIN 0.4 MG SL tablet Commonly known as: NITROSTAT Place 1 tablet (0.4 mg total) under the tongue every 5 (five) minutes as needed for chest pain. Go to ER if third tablet is necessary   pantoprazole 40 MG tablet Commonly known as: Protonix Take 1 tablet (40 mg total) by mouth daily. Take 30-60 min before first meal of the day   pramipexole 1 MG tablet Commonly known as: MIRAPEX TAKE 1 TABLET BY MOUTH AT BEDTIME   tamoxifen 20 MG tablet Commonly known as: NOLVADEX Take 1 tablet (20 mg total) by mouth daily.   valACYclovir 500 MG tablet Commonly known as: Valtrex Take 1 tablet (500 mg total) by mouth daily as needed (Cold sores).       Allergies  Allergen Reactions  . Zostavax [Zoster Vaccine Live] Rash  . Penicillins Hives    Did it involve swelling of the face/tongue/throat, SOB, or low BP? No Did it involve sudden or severe rash/hives, skin peeling, or any reaction on the inside of your mouth or nose? Yes Did you need to seek medical attention at a hospital or doctor's office? Yes When did it last happen?14 or 15 If all above answers are "NO", may proceed with cephalosporin  use.   . Naproxen Hives  . Zoloft [Sertraline Hcl] Hives  . Latex Itching and Rash    Condoms    Consultations:  Hematology oncology   Procedures/Studies: DG Chest 2 View  Result Date: 05/15/2020 CLINICAL DATA:  Chest pain and dyspnea, recent left thoracentesis EXAM: CHEST - 2 VIEW COMPARISON:  05/13/2020 chest radiograph. FINDINGS: Stable cardiomediastinal silhouette with normal heart size. No pneumothorax. Small left pleural effusion is mildly increased. No significant right pleural effusion. Increased left lung base opacity. IMPRESSION: 1. No pneumothorax. 2. Small left pleural effusion, mildly increased. Increased left lung base opacity, favor atelectasis. Electronically Signed   By: Ilona Sorrel M.D.   On: 05/15/2020 11:20   DG Chest 2 View  Result Date: 05/12/2020 CLINICAL DATA:  Shortness of breath EXAM: CHEST - 2 VIEW COMPARISON:  Radiograph 04/18/2020, CT 02/23/2020 FINDINGS: There is increasing diffuse hazy interstitial opacities with peribronchovascular cuffing, fissural and septal thickening and a new moderate left pleural effusion tracking along the lateral left lung. No right effusion. No pneumothorax. The aorta is calcified. The remaining  cardiomediastinal contours are unremarkable. Cardiac silhouette partially obscured by overlying opacity though appears grossly similar to comparison exam. Dextrocurvature of the midthoracic spine, similar to prior. Prior surgical repair of the right rotator cuff with anchor in the proximal humerus. No acute osseous or soft tissue abnormality. IMPRESSION: Worsening interstitial opacities, favor worsening edema with new moderate left pleural effusion. Adjacent opacity likely reflect some passive atelectasis and more diffuse edematous change or underlying consolidation is difficult to exclude. Electronically Signed   By: Lovena Le M.D.   On: 05/12/2020 17:31   DG Chest 2 View  Result Date: 04/18/2020 CLINICAL DATA:  Shortness of breath. Reported  history of lung carcinoma on the right. EXAM: CHEST - 2 VIEW COMPARISON:  Chest radiograph October 23, 2016; chest CT Feb 23, 2020 FINDINGS: Mild thickening in the right perihilar region appear stable compared to recent CT examination. A previously noted nodular opacity in the left base seen on CT is not appreciable by radiography. A lesion in the medial right base seen on CT is also not appreciable. There is mild atelectasis in the lung bases. There is no frank edema or airspace opacity. The heart size and pulmonary vascularity are normal. No adenopathy. There is midthoracic dextroscoliosis with thoracolumbar levoscoliosis. There is postoperative change in the right shoulder. IMPRESSION: Stable appearing right perihilar soft tissue thickening which may be due to post radiation therapy change. Nodular opacities in the lung bases seen on recent CT are not appreciable by radiographic examination. There is mild bibasilar atelectasis. No edema or consolidation. Heart size normal. No adenopathy is appreciable by radiography. Electronically Signed   By: Lowella Grip III M.D.   On: 04/18/2020 11:50   CT ANGIO CHEST PE W OR WO CONTRAST  Result Date: 05/12/2020 CLINICAL DATA:  Worsening shortness of breath. EXAM: CT ANGIOGRAPHY CHEST WITH CONTRAST TECHNIQUE: Multidetector CT imaging of the chest was performed using the standard protocol during bolus administration of intravenous contrast. Multiplanar CT image reconstructions and MIPs were obtained to evaluate the vascular anatomy. CONTRAST:  40mL OMNIPAQUE IOHEXOL 350 MG/ML SOLN COMPARISON:  Feb 23, 2020 FINDINGS: Cardiovascular: There is mild calcification of the aortic arch. A very small amount of intraluminal low attenuation is seen within a lower lobe branch of the right pulmonary artery (axial CT images 138 through 145, CT series number 5/coronal reformatted images 47 through 49, CT series number 7 and coronal reformatted images 62 through 64, CT series number  7). Normal heart size. No pericardial effusion. Mediastinum/Nodes: There is moderate severity left hilar lymphadenopathy. The largest lymph node measures approximately 2.0 cm. Mild mediastinal lymphadenopathy is seen within the region of the AP window. Thyroid gland, trachea, and esophagus demonstrate no significant findings. Lungs/Pleura: Small slightly patchy focal areas of atelectasis and/or early infiltrate are seen within the anterolateral aspect of the right upper lobe and posteromedial aspect of the right lower lobe. Moderate to marked severity consolidation is seen throughout the left lower lobe. A 6 mm noncalcified lung nodule is also seen within the anterior aspect of the left upper lobe. There is a moderate sized left pleural effusion. No pneumothorax is identified. Upper Abdomen: Stable, adjacent 1.6 cm and 1.7 cm well-defined areas of low attenuation are seen within the left lobe of the liver. Musculoskeletal: There is marked severity dextroscoliosis of the thoracic spine. Multilevel degenerative changes are seen. Review of the MIP images confirms the above findings. IMPRESSION: 1. Very small amount of acute pulmonary embolism within a lower lobe branch of the right  pulmonary artery. 2. Moderate to marked severity consolidation throughout the left lower lobe, with a moderate sized left pleural effusion. 3. Small slightly patchy focal areas of atelectasis and/or early infiltrate are seen within the anterolateral aspect of the right upper lobe and posteromedial aspect of the right lower lobe. 4. 6 mm noncalcified lung nodule within the anterior aspect of the left upper lobe. Correlation with 12 month follow-up chest CT is recommended to determine stability. 5. Stable, adjacent well-defined areas of low attenuation within the left lobe of the liver. These may represent hemangiomas. 6. Aortic atherosclerosis. Aortic Atherosclerosis (ICD10-I70.0). Electronically Signed   By: Virgina Norfolk M.D.   On:  05/12/2020 19:19   US Venous Img Lower Bilateral (DVT)  Result Date: 05/14/2020 CLINICAL DATA:  69 year old with history of lung cancer and small pulmonary emboli on recent CTA. EXAM: BILATERAL LOWER EXTREMITY VENOUS DOPPLER ULTRASOUND TECHNIQUE: Gray-scale sonography with graded compression, as well as color Doppler and duplex ultrasound were performed to evaluate the lower extremity deep venous systems from the level of the common femoral vein and including the common femoral, femoral, profunda femoral, popliteal and calf veins including the posterior tibial, peroneal and gastrocnemius veins when visible. The superficial great saphenous vein was also interrogated. Spectral Doppler was utilized to evaluate flow at rest and with distal augmentation maneuvers in the common femoral, femoral and popliteal veins. COMPARISON:  None. FINDINGS: RIGHT LOWER EXTREMITY Common Femoral Vein: No evidence of thrombus. Normal compressibility, respiratory phasicity and response to augmentation. Saphenofemoral Junction: No evidence of thrombus. Normal compressibility and flow on color Doppler imaging. Profunda Femoral Vein: No evidence of thrombus. Normal compressibility and flow on color Doppler imaging. Femoral Vein: No evidence of thrombus. Normal compressibility, respiratory phasicity and response to augmentation. Popliteal Vein: No evidence of thrombus. Normal compressibility, respiratory phasicity and response to augmentation. Calf Veins: Positive for thrombus. Paired peroneal veins are not compressible and do not have color Doppler flow. Posterior tibial veins are compressible with color Doppler flow. LEFT LOWER EXTREMITY Common Femoral Vein: No evidence of thrombus. Normal compressibility, respiratory phasicity and response to augmentation. Saphenofemoral Junction: No evidence of thrombus. Normal compressibility and flow on color Doppler imaging. Profunda Femoral Vein: No evidence of thrombus. Normal compressibility and  flow on color Doppler imaging. Femoral Vein: No evidence of thrombus. Normal compressibility, respiratory phasicity and response to augmentation. Popliteal Vein: No evidence of thrombus. Normal compressibility, respiratory phasicity and response to augmentation. Calf Veins: No evidence of thrombus. Normal compressibility and flow on color Doppler imaging. Other Findings:  None. IMPRESSION: 1. Positive for deep venous thrombosis in the right calf. Thrombus involving right peroneal veins. 2. Negative for deep venous thrombosis in the left lower extremity. Electronically Signed   By: Markus Daft M.D.   On: 05/14/2020 16:02   DG Chest Port 1 View  Result Date: 05/13/2020 CLINICAL DATA:  Post thoracentesis EXAM: PORTABLE CHEST 1 VIEW COMPARISON:  Radiograph 05/12/2020, CT 05/12/2020 FINDINGS: Significant interval decrease in size of a left pleural effusion with some small residual pleural thickening in the left lung base. Some residual bandlike opacities, likely atelectatic change in the left infrahilar lung. Residual edematous features present elsewhere throughout both lungs. Cardiomediastinal contours are similar to comparison with a calcified aorta. No acute osseous or soft tissue abnormality. Degenerative changes are present in the imaged spine and shoulders. Unchanged curvature of the spine. Telemetry leads overlie the chest. IMPRESSION: 1. Significant interval decrease in size of a left pleural effusion with only small residual effusion.  2. Partial re-expansion of the left lower lobe with few bandlike opacities likely reflecting subsegmental atelectasis. 3. Persistent features of interstitial edema. 4.  Aortic Atherosclerosis (ICD10-I70.0). Electronically Signed   By: Lovena Le M.D.   On: 05/13/2020 15:47   ECHOCARDIOGRAM COMPLETE  Result Date: 05/13/2020    ECHOCARDIOGRAM REPORT   Patient Name:   KASEE [name] Date of Exam: 05/13/2020 Medical Rec #:  [id number]                  Height:       62.0  in Accession #:    [id number]                 Weight:       174.0 lb Date of Birth:  04-01-1951                 BSA:          1.802 m Patient Age:    39 years                   BP:           115/54 mmHg Patient Gender: F                          HR:           84 bpm. Exam Location:  [facility] Procedure: 2D Echo, Color Doppler and Cardiac Doppler Indications:     Acute respiratory insufficiency 518.82  History:         Patient has no prior history of Echocardiogram examinations.                  Anginal pain, pneumonia.  Sonographer:     Sherrie Sport RDCS (AE) Referring Phys:  [id number] Gretta Cool PATEL Diagnosing Phys: Nelva Bush MD  Sonographer Comments: No apical window, no parasternal window and Technically challenging study due to limited acoustic windows. IMPRESSIONS  1. Left ventricular ejection fraction, by estimation, is >55%. The left ventricle has normal function. Left ventricular endocardial border not optimally defined to evaluate regional wall motion. There is mild left ventricular hypertrophy. Left ventricular diastolic function could not be evaluated.  2. Right ventricular systolic function is normal. The right ventricular size is normal.  3. Moderate pleural effusion in the left lateral region.  4. The mitral valve was not well visualized.  5. The aortic valve was not well visualized. FINDINGS  Left Ventricle: Left ventricular ejection fraction, by estimation, is >55%. The left ventricle has normal function. Left ventricular endocardial border not optimally defined to evaluate regional wall motion. The left ventricular internal cavity size was  normal in size. There is mild left ventricular hypertrophy. Left ventricular diastolic function could not be evaluated. Right Ventricle: The right ventricular size is normal. No increase in right ventricular wall thickness. Right ventricular systolic function is normal. Left Atrium: Left atrial size was not well visualized. Right Atrium: Right atrial size was not well  visualized. Pericardium: Trivial pericardial effusion is present. Presence of pericardial fat pad. Mitral Valve: The mitral valve was not well visualized. Tricuspid Valve: The tricuspid valve is not well visualized. Aortic Valve: The aortic valve was not well visualized. Pulmonic Valve: The pulmonic valve was not well visualized. Aorta: The aortic root was not well visualized. IAS/Shunts: The interatrial septum was not well visualized. Additional Comments: There is a moderate pleural effusion in the left lateral region.  LEFT VENTRICLE PLAX  2D LVIDd:         2.74 cm LVIDs:         1.98 cm LV PW:         1.30 cm LV IVS:        1.07 cm  Nelva Bush MD Electronically signed by Nelva Bush MD Signature Date/Time: 05/13/2020/12:23:16 PM    Final    US THORACENTESIS ASP PLEURAL SPACE W/IMG GUIDE  Result Date: 05/13/2020 INDICATION: Lung cancer, pleural effusion EXAM: ULTRASOUND GUIDED LEFT THORACENTESIS MEDICATIONS: 1% lidocaine local COMPLICATIONS: None immediate. PROCEDURE: An ultrasound guided thoracentesis was thoroughly discussed with the patient and questions answered. The benefits, risks, alternatives and complications were also discussed. The patient understands and wishes to proceed with the procedure. Written consent was obtained. Ultrasound was performed to localize and mark an adequate pocket of fluid in the left chest. The area was then prepped and draped in the normal sterile fashion. 1% Lidocaine was used for local anesthesia. Under ultrasound guidance a 6 Fr Safe-T-Centesis catheter was introduced. Thoracentesis was performed. The catheter was removed and a dressing applied. FINDINGS: A total of approximately 1.5 L of blood tinged pleural fluid was removed. Samples were sent to the laboratory as requested by the clinical team. IMPRESSION: Successful ultrasound guided left thoracentesis yielding 1.5 L of pleural fluid. Electronically Signed   By: Jerilynn Mages.  Shick M.D.   On: 05/13/2020 15:44    (Echo,  Carotid, EGD, Colonoscopy, ERCP)    Subjective: Patient seen and examined.  Anxious with everything going on with him.  Husband was at the bedside.  We discussed about findings and possible pleural effusion from malignancy related to lung cancer.  Discussed about the blood clots on her lungs and on her right leg.  Educated about Eliquis use.  She was on 2 L oxygen at rest, discontinued and mobilized with no drop in oxygen level. Patient will be going home, they will have her results follow-up by oncologist.   Discharge Exam: Vitals:   05/16/20 0730 05/16/20 1119  BP:  (!) 91/59  Pulse:  92  Resp:    Temp:  98.7 F (37.1 C)  SpO2: 93% 97%   Vitals:   05/16/20 0407 05/16/20 0722 05/16/20 0730 05/16/20 1119  BP: (!) 102/52 (!) 99/53  (!) 91/59  Pulse: 91 89  92  Resp: 16     Temp: 98.7 F (37.1 C) 97.8 F (36.6 C)  98.7 F (37.1 C)  TempSrc: Oral Oral  Oral  SpO2: 96% 96% 93% 97%  Weight:      Height:        General: Pt is alert, awake, not in acute distress, chronically sick looking.  On room air.  Not in any distress. Cardiovascular: RRR, S1/S2 +, no rubs, no gallops Respiratory: CTA bilaterally, no wheezing, no rhonchi, good equal air entry bilateral. Abdominal: Soft, NT, ND, bowel sounds + Extremities: no edema, no cyanosis, no tenderness.  Bevelyn Buckles' sign negative.    The results of significant diagnostics from this hospitalization (including imaging, microbiology, ancillary and laboratory) are listed below for reference.     Microbiology: Recent Results (from the past 240 hour(s))  SARS Coronavirus 2 by RT PCR (hospital order, performed in [facility] hospital lab) Nasopharyngeal Nasopharyngeal Swab     Status: None   Collection Time: 05/12/20  5:48 PM   Specimen: Nasopharyngeal Swab  Result Value Ref Range Status   SARS Coronavirus 2 NEGATIVE NEGATIVE Final    Comment: (NOTE) SARS-CoV-2 target nucleic acids  are NOT DETECTED.  The SARS-CoV-2 RNA is generally  detectable in upper and lower respiratory specimens during the acute phase of infection. The lowest concentration of SARS-CoV-2 viral copies this assay can detect is 250 copies / mL. A negative result does not preclude SARS-CoV-2 infection and should not be used as the sole basis for treatment or other patient management decisions.  A negative result may occur with improper specimen collection / handling, submission of specimen other than nasopharyngeal swab, presence of viral mutation(s) within the areas targeted by this assay, and inadequate number of viral copies (<250 copies / mL). A negative result must be combined with clinical observations, patient history, and epidemiological information.  Fact Sheet for Patients:   StrictlyIdeas.no  Fact Sheet for Healthcare Providers: BankingDealers.co.za  This test is not yet approved or  cleared by the [place] FDA and has been authorized for detection and/or diagnosis of SARS-CoV-2 by FDA under an Emergency Use Authorization (EUA).  This EUA will remain in effect (meaning this test can be used) for the duration of the COVID-19 declaration under Section 564(b)(1) of the Act, 21 U.S.C. section 360bbb-3(b)(1), unless the authorization is terminated or revoked sooner.  Performed at [facility], [address] [place], [place] [address]   Body fluid culture     Status: None   Collection Time: 05/13/20  3:20 PM   Specimen: PATH Cytology Pleural fluid  Result Value Ref Range Status   Specimen Description   Final    PLEURAL Performed at [facility], [address] [place], [place] [address]    Special Requests   Final    NONE Performed at [facility], [address] [place], [place] [address]    Gram Stain   Final    NO WBC SEEN FEW YEAST CRITICAL RESULT CALLED TO, READ BACK BY AND VERIFIED WITH: A GLADENY RN 05/14/20 JDW Performed at [facility] [address] [place], [place] [address]    Culture FEW CANDIDA ALBICANS  Final   Report Status 05/16/2020 FINAL  Final  Acid Fast Smear (AFB)     Status: None   Collection Time: 05/13/20  3:20 PM   Specimen: PATH Cytology Pleural fluid  Result Value Ref Range Status   AFB Specimen Processing Concentration  Final   Acid Fast Smear Negative  Final    Comment: (NOTE) Performed At: [facility] [place], [place] [id number] Rush Farmer MD KG:[id number]    Source (AFB) PLEURAL  Final    Comment: Performed at [facility], [address] [place], [place] [address]     Labs: BNP (last 3 results) Recent Labs    05/12/20 1659  BNP 85.8   Basic Metabolic Panel: Recent Labs  Lab 05/12/20 1659 05/13/20 0434 05/14/20 0547  NA 138 138 135  K 4.1 4.0 4.0  CL 108 107 102  CO2 21* 23 25  GLUCOSE 119* 101* 115*  BUN 16 13 11   CREATININE 0.53 0.58 0.54  CALCIUM 8.4* 8.2* 8.3*   Liver Function Tests: Recent Labs  Lab 05/12/20 1659 05/13/20 0434 05/14/20 0547  AST 25 21 29   ALT 35 31 46*  ALKPHOS 39 38 39  BILITOT 0.8 0.7 1.1  PROT 6.7 6.4* 6.3*  ALBUMIN 3.6 3.4* 3.4*   No results for input(s): LIPASE, AMYLASE in the last 168 hours. No results for input(s): AMMONIA in the last 168 hours. CBC: Recent Labs  Lab 05/12/20 1659 05/13/20 0434  05/14/20 0547 05/15/20 0400  WBC 8.7 8.1 11.1* 8.4  NEUTROABS 6.7  --   --   --   HGB 12.8 12.5 12.2 11.6*  HCT 39.0 37.8 37.6 34.7*  MCV 90.3 89.4 90.6 88.7  PLT 258 237 220 192   Cardiac Enzymes: No results for input(s): CKTOTAL, CKMB, CKMBINDEX, TROPONINI in the last 168 hours. BNP: Invalid input(s): POCBNP CBG: No results for input(s): GLUCAP in the last 168 hours. D-Dimer No results for input(s): DDIMER in the last 72 hours. Hgb A1c No results for input(s): HGBA1C in the last 72 hours. Lipid Profile No results for input(s): CHOL, HDL, LDLCALC, TRIG, CHOLHDL, LDLDIRECT in  the last 72 hours. Thyroid function studies No results for input(s): TSH, T4TOTAL, T3FREE, THYROIDAB in the last 72 hours.  Invalid input(s): FREET3 Anemia work up No results for input(s): VITAMINB12, FOLATE, FERRITIN, TIBC, IRON, RETICCTPCT in the last 72 hours. Urinalysis    Component Value Date/Time   COLORURINE YELLOW (A) 05/12/2020 1659   APPEARANCEUR HAZY (A) 05/12/2020 1659   APPEARANCEUR Clear 02/11/2017 0933   LABSPEC 1.010 05/12/2020 1659   PHURINE 5.0 05/12/2020 1659   GLUCOSEU NEGATIVE 05/12/2020 1659   HGBUR NEGATIVE 05/12/2020 1659   BILIRUBINUR NEGATIVE 05/12/2020 1659   BILIRUBINUR negative 03/28/2020 1507   BILIRUBINUR Negative 02/11/2017 0933   KETONESUR NEGATIVE 05/12/2020 1659   PROTEINUR NEGATIVE 05/12/2020 1659   UROBILINOGEN 0.2 03/28/2020 1507   UROBILINOGEN 1.0 01/07/2015 1221   NITRITE NEGATIVE 05/12/2020 1659   LEUKOCYTESUR NEGATIVE 05/12/2020 1659   Sepsis Labs Invalid input(s): PROCALCITONIN,  WBC,  LACTICIDVEN Microbiology Recent Results (from the past 240 hour(s))  SARS Coronavirus 2 by RT PCR (hospital order, performed in [place] hospital lab) Nasopharyngeal Nasopharyngeal Swab     Status: None   Collection Time: 05/12/20  5:48 PM   Specimen: Nasopharyngeal Swab  Result Value Ref Range Status   SARS Coronavirus 2 NEGATIVE NEGATIVE Final    Comment: (NOTE) SARS-CoV-2 target nucleic acids are NOT DETECTED.  The SARS-CoV-2 RNA is generally detectable in upper and lower respiratory specimens during the acute phase of infection. The lowest concentration of SARS-CoV-2 viral copies this assay can detect is 250 copies / mL. A negative result does not preclude SARS-CoV-2 infection and should not be used as the sole basis for treatment or other patient management decisions.  A negative result may occur with improper specimen collection / handling, submission of specimen other than nasopharyngeal swab, presence of viral mutation(s) within  the areas targeted by this assay, and inadequate number of viral copies (<250 copies / mL). A negative result must be combined with clinical observations, patient history, and epidemiological information.  Fact Sheet for Patients:   StrictlyIdeas.no  Fact Sheet for Healthcare Providers: BankingDealers.co.za  This test is not yet approved or  cleared by the [place] FDA and has been authorized for detection and/or diagnosis of SARS-CoV-2 by FDA under an Emergency Use Authorization (EUA).  This EUA will remain in effect (meaning this test can be used) for the duration of the COVID-19 declaration under Section 564(b)(1) of the Act, 21 U.S.C. section 360bbb-3(b)(1), unless the authorization is terminated or revoked sooner.  Performed at [facility], [address] [place], [place] [address]   Body fluid culture     Status: None   Collection Time: 05/13/20  3:20 PM   Specimen: PATH Cytology Pleural fluid  Result Value Ref Range Status   Specimen Description   Final    PLEURAL  Performed at [facility], [address], [place], [place] [address]    Special Requests   Final    NONE Performed at [facility], [place], [place] [address]    Gram Stain   Final    NO WBC SEEN FEW YEAST CRITICAL RESULT CALLED TO, READ BACK BY AND VERIFIED WITH: A GLADENY RN 05/14/20 JDW Performed at [facility] [address], [place], [place] [address]    Culture FEW CANDIDA ALBICANS  Final   Report Status 05/16/2020 FINAL  Final  Acid Fast Smear (AFB)     Status: None   Collection Time: 05/13/20  3:20 PM   Specimen: PATH Cytology Pleural fluid  Result Value Ref Range Status   AFB Specimen Processing Concentration  Final   Acid Fast Smear Negative  Final    Comment: (NOTE) Performed At: [facility] [address] [place], [place] [id number] Rush Farmer MD IO:[id number]     Source (AFB) PLEURAL  Final    Comment: Performed at [facility], [address] [place], [place] [address]     Time coordinating discharge:  35 minutes  SIGNED:   Barb Merino, MD  [facility] 05/16/2020, 1:43 PM

## 2020-05-17 NOTE — Progress Notes (Signed)
[facility]  [address] [place], [place] [address] Phone: [phone]  Fax: [phone]   Clinic Day:  05/18/2020  Referring physician: Birdie Sons, MD  Chief Complaint: Elizabeth [name] is a 69 y.o. 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  HPI:  The patient was last seen in the [facility] on 12/09/2019 via telemedicine to review recent PET scan.  PET scan on 12/07/2019 showed hypermetabolic paratracheal, right hilar, and subcarinal adenopathy, suspicious for active malignancy. There was a 1.5 x 0.9 cm right lower lobe nodule with irregular margins (SUV 2.3).  There was bandlike density with small nodular components medially at the left lung base (not hypermetabolic).  She was referred to Dr Patsey Berthold for EBUS.    She underwent bronchoscopy with endobronchial ultrasound on 12/22/2019 by Dr. Lamonte Sakai in [place].  Pathology was consistent with adenocarcinoma of the lung.  Lymph node 4R, 7, and 10 R were positive for carcinoma.  PD-L1 was 100%.  BRAF mutation V600E was positive.  The patient has been followed by Dr. Curt Bears in [place].  She received concurrent chemotherapy (carboplatin and Taxol x 5 weekly cycles) and a truncated course of radiation (12 fractions: 01/14/2020 - 01/28/2020) to limit potential overlap with her previous radiation therapy to her chest. Chemotherapy completed on 02/09/2020.   She began consolidative immunotherapy with Imfinzi (durvalumab) on 03/03/2020.  She has received 3 cycles to date (last 04/28/2020).  She was last seen by Dr. Julien Nordmann on 04/28/2020.  She had a mild cough and shortness of breath.  Oxygen sats were 95-97%.  She described progressive shortness of breath since mid 04/2020. She was referred to the emergency room for possible pneumonitis.  She was admitted to [facility] from  05/12/2020 - 05/16/2020.  Chest CT angiogram on 05/12/2020 revealed a very small amount of acute pulmonary embolism within a lower lobe branch of the right pulmonary artery.  There was moderate to marked consolidation throughout the LLL with a moderate size left pleural effusion.  There were small slightly patchy focal areas of atelectasis and/or infiltrate within the anterolateral aspect of the RUL and posterior medial aspect of the RLL.  There was a 6 mm noncalcified lung nodule within the anterior aspect of the LUL.  There was stable well-defined areas of low-attenuation within the left lobe of the liver (possible hemangiomas).  Thoracentesis on 05/13/2020 revealed 1.5 L of blood-tinged pleural fluid.  Pleural fluid:  Cell count 2566 (3% segs, 32% lymphs, 61% monocytes), LDH 670, triglycerides 71, culture (no WBCs, few yeast), amylase 62, albumen 2.7, protein 4.5, glucose 79.  Cytology is pending.  Bilateral lower extremity duplex on 05/14/2020 revealed a DVT in the right calf.  There was thrombus involving the right peroneal veins.  There was no DVT in the left lower extremity.  Echo revealed an EF of > 55%.  She was treated with broad spectrum antibiotics for 3 days.  She began Eliquis.  Symptomatically, she has been "fine".  Her breathing is better; she has been using an albuterol inhaler. She has been unable to lay flat but has an adjustable bed. Her vision has improved. She woke up yesterday and noticed a lump on the medial aspect of her right ankle. She is taking Eliquis 10 mg BID until 05/22/2020 then switches to 5 mg BID. She is able to complete ADLs independently and does chores around the house.  The patient does not have a port at this time. She performs monthly breast self exams.  She states that nobody has contacted her about scheduling a mammogram.  PET scan is scheduled for 05/24/2020.   Past Medical History:  Diagnosis Date  . Anginal pain ([place])   . Anxiety   . Breast  cancer, right ([place]) 04/2019   breast cancer (radical lumpectomy and radiation  . Complication of anesthesia    one time woke when she had a tube down throat and had a panic attack  . Constipation due to opioid therapy   . Degenerative joint disease (DJD) of hip   . GERD (gastroesophageal reflux disease)   . Headache    migraines when she was working  . Osteopenia   . Pneumonia   . Restless leg syndrome    takes Mirapex and xanax  . Scoliosis   . SUI (stress urinary incontinence, female)   . Vaso-vagal reaction    after back surgery    Past Surgical History:  Procedure Laterality Date  . APPENDECTOMY  1963  . BACK SURGERY    . BREAST BIOPSY Right 04/27/2019   Affirm Bx "X" clip-INVASIVE MAMMARY CARCINOMA,  . BREAST CYST ASPIRATION Right 1988  . BREAST EXCISIONAL BIOPSY Right 1990   benign x 3  . BREAST LUMPECTOMY Right 05/08/2019  . BREAST LUMPECTOMY WITH SENTINEL LYMPH NODE BIOPSY Right 05/08/2019   Procedure: RIGHT BREAST LUMPECTOMY WITH SENTINEL LYMPH NODE BX AND WIRE LOC., LATEX ALLERGY;  Surgeon: Jules Husbands, MD;  Location: [facility];  Service: General;  Laterality: Right;  . BREAST SURGERY    . CATARACT EXTRACTION W/PHACO Right 03/31/2018   Procedure: CATARACT EXTRACTION PHACO AND INTRAOCULAR LENS PLACEMENT ([place]) RIGHT;  Surgeon: Leandrew Koyanagi, MD;  Location: [facility];  Service: Ophthalmology;  Laterality: Right;  Latex sensitivity  . CATARACT EXTRACTION W/PHACO Left 04/16/2018   Procedure: CATARACT EXTRACTION PHACO AND INTRAOCULAR LENS PLACEMENT ([place])  LEFT;  Surgeon: Leandrew Koyanagi, MD;  Location: [place] [facility];  Service: Ophthalmology;  Laterality: Left;  . EYE SURGERY     Lasik  . FINE NEEDLE ASPIRATION  12/22/2019   Procedure: FINE NEEDLE ASPIRATION (FNA) LINEAR;  Surgeon: Collene Gobble, MD;  Location: [facility];  Service: Pulmonary;;  . FOOT SURGERY Bilateral   . HYSTEROTOMY    . LAMINECTOMY  2006  . ROTATOR CUFF REPAIR Bilateral    . TONSILLECTOMY     age 52  . TOTAL HIP ARTHROPLASTY Right 01/18/2015   Procedure: RIGHT TOTAL HIP ARTHROPLASTY ANTERIOR APPROACH;  Surgeon: Renette Butters, MD;  Location: [facility];  Service: Orthopedics;  Laterality: Right;  . TUBAL LIGATION    . VIDEO BRONCHOSCOPY WITH ENDOBRONCHIAL ULTRASOUND N/A 12/22/2019   Procedure: VIDEO BRONCHOSCOPY WITH ENDOBRONCHIAL ULTRASOUND;  Surgeon: Collene Gobble, MD;  Location: [facility];  Service: Pulmonary;  Laterality: N/A;    Family History  Problem Relation Age of Onset  . Alcohol abuse Mother   . Cancer Mother 11       lung  . Migraines Mother   . Cancer Father 34       prostate  . Diabetes Father   . Alcohol abuse Brother   . Cancer Brother        skin  . Cancer Daughter        breast  . Breast cancer Daughter 39  . Bladder Cancer Neg Hx   . Kidney cancer Neg Hx     Social History:  reports that she quit smoking about 8 years ago. Her smoking use included cigarettes. She has a 10.00 pack-year smoking history. She has never used smokeless tobacco. She reports current alcohol use of about 5.0 standard drinks of alcohol per week. She reports that she does not use drugs. She quit smoking about 8 years ago. Her smoking use included cigarettes. She has a 10.00 pack-year smoking history. She has never used smokeless tobacco. She reports current alcohol use of about 5.0 standard drinks of alcohol per week. She reports that she does not use drugs.  The patient denies any exposure to radiation or toxins.  The patient lives in [place].  She is accompanied by her husband, Elenore Rota, today.  Allergies:  Allergies  Allergen Reactions  . Zostavax [Zoster Vaccine Live] Rash  . Penicillins Hives    Did it involve swelling of the face/tongue/throat, SOB, or low BP? No Did it involve sudden or severe rash/hives, skin peeling, or any reaction on the inside of your mouth or nose? Yes Did you need to seek medical attention at a hospital or doctor's office?  Yes When did it last happen?14 or 16 If all above answers are "NO", may proceed with cephalosporin use.   . Naproxen Hives  . Zoloft [Sertraline Hcl] Hives  . Latex Itching and Rash    Condoms    Current Medications: Current Outpatient Medications  Medication Sig Dispense Refill  . albuterol (VENTOLIN HFA) 108 (90 Base) MCG/ACT inhaler Inhale 2 puffs into the lungs every 6 (six) hours as needed for wheezing or shortness of breath. 8 g 2  . ALPRAZolam (XANAX) 0.5 MG tablet Take 3 tablets (1.5 mg total) by mouth at bedtime.    Marland Kitchen apixaban (ELIQUIS) 5 MG TABS tablet Take 2 tablets (10 mg total) by mouth 2 (two) times daily for 5 days. 20 tablet 0  . calcium-vitamin D (OSCAL WITH D) 500-200 MG-UNIT tablet Take 1 tablet by mouth daily.    . Multiple Vitamin (MULTIVITAMIN WITH MINERALS) TABS tablet Take 1 tablet by mouth daily.    . pantoprazole (PROTONIX) 40 MG tablet Take 1 tablet (40 mg total) by mouth daily. Take 30-60 min before first meal of the day 30 tablet 2  . pramipexole (MIRAPEX) 1 MG tablet TAKE 1 TABLET BY MOUTH AT BEDTIME (Patient taking differently: Take 1 mg by mouth at bedtime. ) 30 tablet 12  . tamoxifen (NOLVADEX) 20 MG tablet Take 1 tablet (20 mg total) by mouth daily. 90 tablet 3  . [START ON 05/22/2020] apixaban (ELIQUIS) 5 MG TABS tablet Take 1 tablet (5 mg total) by mouth 2 (two) times daily. (Patient not taking: Reported on 05/18/2020) 60 tablet 2  . clindamycin (CLEOCIN) 150 MG capsule Take 450 mg by mouth See admin instructions. Take before dental procedures  (Patient not taking: Reported on 05/18/2020)    . famotidine (PEPCID) 20 MG tablet One after supper (Patient not taking: Reported on 05/18/2020) 30 tablet 11  . ivabradine (CORLANOR) 5 MG TABS tablet Take 2 hours prior to cardiac CT along with metoprolol (118m tablet) (Patient not taking: Reported on 05/18/2020) 2 tablet 0  . metoprolol tartrate (LOPRESSOR) 100 MG tablet Take 1 tablet (100 mg total) by mouth once  for 1 dose. Take 2 hours prior to your CT scan. 1 tablet 0  . nitroGLYCERIN (NITROSTAT) 0.4 MG SL tablet Place 1 tablet (0.4 mg total) under the tongue every 5 (five) minutes as needed for chest pain. Go to ER if third tablet  is necessary (Patient not taking: Reported on 05/18/2020) 30 tablet 5  . valACYclovir (VALTREX) 500 MG tablet Take 1 tablet (500 mg total) by mouth daily as needed (Cold sores). (Patient not taking: Reported on 05/18/2020)     No current facility-administered medications for this visit.    Review of Systems  Constitutional: Negative for chills, diaphoresis, fever and malaise/fatigue.       Feels "fine."  HENT: Negative for congestion, ear discharge, ear pain, hearing loss, nosebleeds, sinus pain, sore throat and tinnitus.   Eyes: Positive for blurred vision (improved). Negative for double vision.  Respiratory: Negative for cough, hemoptysis, sputum production and shortness of breath.        Breathing is better s/p thoracentesis.  Cardiovascular: Positive for orthopnea (has adjustable bed). Negative for chest pain, palpitations and leg swelling.  Gastrointestinal: Negative for abdominal pain, blood in stool, constipation, diarrhea, heartburn, melena, nausea and vomiting.  Genitourinary: Negative for dysuria, frequency, hematuria and urgency.  Musculoskeletal: Negative for back pain, joint pain, myalgias and neck pain.       Lump on medial aspect of right ankle.  Skin: Negative for itching and rash.  Neurological: Negative for dizziness, tingling, sensory change, weakness and headaches.  Endo/Heme/Allergies: Does not bruise/bleed easily.  Psychiatric/Behavioral: Negative for depression and memory loss. The patient is not nervous/anxious and does not have insomnia.   All other systems reviewed and are negative.  Performance status (ECOG): 1  Vitals Blood pressure (!) 110/58, pulse 87, temperature 97.8 F (36.6 C), temperature source Tympanic, resp. rate 18, weight 174 lb  2.6 oz (79 kg), SpO2 95 %.   Physical Exam Vitals and nursing note reviewed.  Constitutional:      General: She is not in acute distress.    Appearance: She is not diaphoretic.     Interventions: Face mask in place.  HENT:     Head: Normocephalic and atraumatic.     Comments: Long dark hair.    Mouth/Throat:     Mouth: Mucous membranes are moist.     Pharynx: Oropharynx is clear.  Eyes:     General: No scleral icterus.    Extraocular Movements: Extraocular movements intact.     Conjunctiva/sclera: Conjunctivae normal.     Pupils: Pupils are equal, round, and reactive to light.     Comments: Brown eyes.  Cardiovascular:     Rate and Rhythm: Normal rate and regular rhythm.     Heart sounds: Normal heart sounds. No murmur heard.   Pulmonary:     Effort: Pulmonary effort is normal. No respiratory distress.     Breath sounds: No wheezing or rales.     Comments: Dull bilateral lung bases. Chest:     Chest wall: No tenderness.     Breasts:        Right: Swelling (mild edema s/p radiation) and skin change (hyperpigmentation s/p radiation) present.        Left: No swelling, inverted nipple, mass, skin change or tenderness.  Abdominal:     General: Bowel sounds are normal. There is no distension.     Palpations: Abdomen is soft. There is no mass.     Tenderness: There is no abdominal tenderness. There is no guarding or rebound.  Musculoskeletal:        General: No swelling or tenderness. Normal range of motion.     Cervical back: Normal range of motion and neck supple.  Lymphadenopathy:     Head:     Right side of  head: No preauricular, posterior auricular or occipital adenopathy.     Left side of head: No preauricular, posterior auricular or occipital adenopathy.     Cervical: No cervical adenopathy.     Upper Body:     Right upper body: No supraclavicular or axillary adenopathy.     Left upper body: No supraclavicular or axillary adenopathy.     Lower Body: No right inguinal  adenopathy. No left inguinal adenopathy.  Skin:    General: Skin is warm and dry.  Neurological:     Mental Status: She is alert and oriented to person, place, and time.  Psychiatric:        Behavior: Behavior normal.        Thought Content: Thought content normal.        Judgment: Judgment normal.    Admission on 05/12/2020, Discharged on 05/16/2020  Component Date Value Ref Range Status  . Lactic Acid, Venous 05/12/2020 1.3  0.5 - 1.9 mmol/L Final   Performed at [facility], [address] [place], [place] [address]  . Sodium 05/12/2020 138  135 - 145 mmol/L Final  . Potassium 05/12/2020 4.1  3.5 - 5.1 mmol/L Final  . Chloride 05/12/2020 108  98 - 111 mmol/L Final  . CO2 05/12/2020 21* 22 - 32 mmol/L Final  . Glucose, Bld 05/12/2020 119* 70 - 99 mg/dL Final   Glucose reference range applies only to samples taken after fasting for at least 8 hours.  . BUN 05/12/2020 16  8 - 23 mg/dL Final  . Creatinine, Ser 05/12/2020 0.53  0.44 - 1.00 mg/dL Final  . Calcium 05/12/2020 8.4* 8.9 - 10.3 mg/dL Final  . Total Protein 05/12/2020 6.7  6.5 - 8.1 g/dL Final  . Albumin 05/12/2020 3.6  3.5 - 5.0 g/dL Final  . AST 05/12/2020 25  15 - 41 U/L Final  . ALT 05/12/2020 35  0 - 44 U/L Final  . Alkaline Phosphatase 05/12/2020 39  38 - 126 U/L Final  . Total Bilirubin 05/12/2020 0.8  0.3 - 1.2 mg/dL Final  . GFR calc non Af Amer 05/12/2020 >60  >60 mL/min Final  . GFR calc Af Amer 05/12/2020 >60  >60 mL/min Final  . Anion gap 05/12/2020 9  5 - 15 Final   Performed at [facility], [address] [place], [place] [address]  . WBC 05/12/2020 8.7  4.0 - 10.5 K/uL Final  . RBC 05/12/2020 4.32  3.87 - 5.11 MIL/uL Final  . Hemoglobin 05/12/2020 12.8  12.0 - 15.0 g/dL Final  . HCT 05/12/2020 39.0  36 - 46 % Final  . MCV 05/12/2020 90.3  80.0 - 100.0 fL Final  . MCH 05/12/2020 29.6  26.0 - 34.0 pg Final  . MCHC 05/12/2020 32.8  30.0 - 36.0 g/dL Final  . RDW 05/12/2020 13.1  11.5  - 15.5 % Final  . Platelets 05/12/2020 258  150 - 400 K/uL Final  . nRBC 05/12/2020 0.0  0.0 - 0.2 % Final  . Neutrophils Relative % 05/12/2020 77  % Final  . Neutro Abs 05/12/2020 6.7  1.7 - 7.7 K/uL Final  . Lymphocytes Relative 05/12/2020 12  % Final  . Lymphs Abs 05/12/2020 1.1  0.7 - 4.0 K/uL Final  . Monocytes Relative 05/12/2020 7  % Final  . Monocytes Absolute 05/12/2020 0.6  0 - 1 K/uL Final  . Eosinophils Relative 05/12/2020 4  % Final  . Eosinophils Absolute 05/12/2020 0.3  0 - 0 K/uL  Final  . Basophils Relative 05/12/2020 0  % Final  . Basophils Absolute 05/12/2020 0.0  0 - 0 K/uL Final  . Immature Granulocytes 05/12/2020 0  % Final  . Abs Immature Granulocytes 05/12/2020 0.03  0.00 - 0.07 K/uL Final   Performed at [facility], [address] [place], [place] [address]  . Color, Urine 05/12/2020 YELLOW* YELLOW Final  . APPearance 05/12/2020 HAZY* CLEAR Final  . Specific Gravity, Urine 05/12/2020 1.010  1.005 - 1.030 Final  . pH 05/12/2020 5.0  5.0 - 8.0 Final  . Glucose, UA 05/12/2020 NEGATIVE  NEGATIVE mg/dL Final  . Hgb urine dipstick 05/12/2020 NEGATIVE  NEGATIVE Final  . Bilirubin Urine 05/12/2020 NEGATIVE  NEGATIVE Final  . Ketones, ur 05/12/2020 NEGATIVE  NEGATIVE mg/dL Final  . Protein, ur 05/12/2020 NEGATIVE  NEGATIVE mg/dL Final  . Nitrite 05/12/2020 NEGATIVE  NEGATIVE Final  . Chalmers Guest 05/12/2020 NEGATIVE  NEGATIVE Final  . RBC / HPF 05/12/2020 0-5  0 - 5 RBC/hpf Final  . WBC, UA 05/12/2020 0-5  0 - 5 WBC/hpf Final  . Bacteria, UA 05/12/2020 NONE SEEN  NONE SEEN Final  . Squamous Epithelial / LPF 05/12/2020 0-5  0 - 5 Final   Performed at [facility], [name], [place], [place] [address]  . SARS Coronavirus 2 05/12/2020 NEGATIVE  NEGATIVE Final   Comment: (NOTE) SARS-CoV-2 target nucleic acids are NOT DETECTED.  The SARS-CoV-2 RNA is generally detectable in upper and lower respiratory specimens during the acute phase of  infection. The lowest concentration of SARS-CoV-2 viral copies this assay can detect is 250 copies / mL. A negative result does not preclude SARS-CoV-2 infection and should not be used as the sole basis for treatment or other patient management decisions.  A negative result may occur with improper specimen collection / handling, submission of specimen other than nasopharyngeal swab, presence of viral mutation(s) within the areas targeted by this assay, and inadequate number of viral copies (<250 copies / mL). A negative result must be combined with clinical observations, patient history, and epidemiological information.  Fact Sheet for Patients:   StrictlyIdeas.no  Fact Sheet for Healthcare Providers: BankingDealers.co.za  This test is not yet approved or                           cleared by the [place] FDA and has been authorized for detection and/or diagnosis of SARS-CoV-2 by FDA under an Emergency Use Authorization (EUA).  This EUA will remain in effect (meaning this test can be used) for the duration of the COVID-19 declaration under Section 564(b)(1) of the Act, 21 U.S.C. section 360bbb-3(b)(1), unless the authorization is terminated or revoked sooner.  Performed at [facility], [address], [place], [place] [address]   . B Natriuretic Peptide 05/12/2020 35.6  0.0 - 100.0 pg/mL Final   Performed at [facility], [place] [place],  [address]  . TSH 05/12/2020 1.561  0.350 - 4.500 uIU/mL Final   Comment: Performed by a 3rd Generation assay with a functional sensitivity of <=0.01 uIU/mL. Performed at [facility], [address] [place],  [address]   . Sodium 05/13/2020 138  135 - 145 mmol/L Final  . Potassium 05/13/2020 4.0  3.5 - 5.1 mmol/L Final  . Chloride 05/13/2020 107  98 - 111 mmol/L Final  . CO2 05/13/2020 23  22 - 32 mmol/L Final  . Glucose, Bld 05/13/2020  101* 70 - 99 mg/dL Final   Glucose reference range applies only to samples taken after fasting for at least 8 hours.  . BUN 05/13/2020 13  8 - 23 mg/dL Final  . Creatinine, Ser 05/13/2020 0.58  0.44 - 1.00 mg/dL Final  . Calcium 05/13/2020 8.2* 8.9 - 10.3 mg/dL Final  . Total Protein 05/13/2020 6.4* 6.5 - 8.1 g/dL Final  . Albumin 05/13/2020 3.4* 3.5 - 5.0 g/dL Final  . AST 05/13/2020 21  15 - 41 U/L Final  . ALT 05/13/2020 31  0 - 44 U/L Final  . Alkaline Phosphatase 05/13/2020 38  38 - 126 U/L Final  . Total Bilirubin 05/13/2020 0.7  0.3 - 1.2 mg/dL Final  . GFR calc non Af Amer 05/13/2020 >60  >60 mL/min Final  . GFR calc Af Amer 05/13/2020 >60  >60 mL/min Final  . Anion gap 05/13/2020 8  5 - 15 Final   Performed at [facility], [address], [place], [place] [address]  . WBC 05/13/2020 8.1  4.0 - 10.5 K/uL Final  . RBC 05/13/2020 4.23  3.87 - 5.11 MIL/uL Final  . Hemoglobin 05/13/2020 12.5  12.0 - 15.0 g/dL Final  . HCT 05/13/2020 37.8  36 - 46 % Final  . MCV 05/13/2020 89.4  80.0 - 100.0 fL Final  . MCH 05/13/2020 29.6  26.0 - 34.0 pg Final  . MCHC 05/13/2020 33.1  30.0 - 36.0 g/dL Final  . RDW 05/13/2020 13.1  11.5 - 15.5 % Final  . Platelets 05/13/2020 237  150 - 400 K/uL Final  . nRBC 05/13/2020 0.0  0.0 - 0.2 % Final   Performed at [facility], [address], [place], [place] [address]  . Prothrombin Time 05/13/2020 12.8  11.4 - 15.2 seconds Final  . INR 05/13/2020 1.0  0.8 - 1.2 Final   Comment: (NOTE) INR goal varies based on device and disease states. Performed at [facility], [address] [place], [place] [address]   . aPTT 05/13/2020 76* 24 - 36 seconds Final   Comment:        IF BASELINE aPTT IS ELEVATED, SUGGEST PATIENT RISK ASSESSMENT BE USED TO DETERMINE APPROPRIATE ANTICOAGULANT THERAPY. Performed at [facility], [address], [place], [place] [address]   . Free T4 05/12/2020 1.01  0.61 - 1.12 ng/dL Final    Comment: (NOTE) Biotin ingestion may interfere with free T4 tests. If the results are inconsistent with the TSH level, previous test results, or the clinical presentation, then consider biotin interference. If needed, order repeat testing after stopping biotin. Performed at [facility], [address] [place], [place] [address]   . Hgb A1c MFr Bld 05/13/2020 5.2  4.8 - 5.6 % Final   Comment: (NOTE) Pre diabetes:          5.7%-6.4%  Diabetes:              >6.4%  Glycemic control for   <7.0% adults with diabetes   . Mean Plasma Glucose 05/13/2020 102.54  mg/dL Final   Performed at [facility] [address] [place], [place] [address]  . HIV Screen 4th Generation wRfx 05/13/2020 Non Reactive  Non Reactive Final   Performed at [facility], [place] [address], [place], [place] [address]  . aPTT 05/12/2020 27  24 - 36 seconds Final   Performed at [facility], [address] [place],  [address]  . Prothrombin Time  05/12/2020 13.2  11.4 - 15.2 seconds Final  . INR 05/12/2020 1.0  0.8 - 1.2 Final   Comment: (NOTE) INR goal varies based on device and disease states. Performed at [facility], [address], [place], [place] [address]   . Heparin Unfractionated 05/13/2020 0.50  0.30 - 0.70 IU/mL Final   Comment: (NOTE) If heparin results are below expected values, and patient dosage has  been confirmed, suggest follow up testing of antithrombin III levels. Performed at [facility], [address], [place], [place] [address]   . Weight 05/13/2020 2,784  oz Final  . Height 05/13/2020 62  in Final  . BP 05/13/2020 115/54  mmHg Final  . S' Lateral 05/13/2020 1.98  cm Final  . Heparin Unfractionated 05/13/2020 <0.10* 0.30 - 0.70 IU/mL Final   Comment: REPEATED TO VERIFY (NOTE) If heparin results are below expected values, and patient dosage has  been confirmed, suggest follow up testing of antithrombin III  levels. Performed at [facility], [address] [place], [place] [address]   . Troponin I (High Sensitivity) 05/13/2020 <2  <18 ng/L Final   Comment: (NOTE) Elevated high sensitivity troponin I (hsTnI) values and significant  changes across serial measurements may suggest ACS but many other  chronic and acute conditions are known to elevate hsTnI results.  Refer to the "Links" section for chest pain algorithms and additional  guidance. Performed at [facility], [address] [place], [place] [address]   . LD, Fluid 05/13/2020 670* 3 - 23 U/L Final   Comment: (NOTE) Results should be evaluated in conjunction with serum values   . Fluid Type-FLDH 05/13/2020 CYTO PLEU   Final   Performed at [facility], [place] [place], [place] [address]  . Triglycerides, Fluid 05/13/2020 71  Not Estab. mg/dL Final   Comment: (NOTE) The reference intervals and other method performance specifications have not been established for this test. The test result should be integrated into the clinical context for interpretation. The reference interval(s) and other method performance specifications have not been established for this body fluid. The test result must be integrated into the clinical context for interpretation. Performed At: [facility] [place], [place] [id number] Rush Farmer MD IZ:[id number]   . Fluid Type-FTRIG 05/13/2020 CYTO PLEU   Final   Performed at [facility], [address] [place], [place] [address]  . Fluid Type-FCT 05/13/2020 CYTO PLEU   Final  . Color, Fluid 05/13/2020 RED* YELLOW Final  . Appearance, Fluid 05/13/2020 CLOUDY* CLEAR Final  . Total Nucleated Cell Count, Fluid 05/13/2020 2,566  cu mm Final  . Neutrophil Count, Fluid 05/13/2020 3  % Final  . Lymphs, Fluid 05/13/2020 32  % Final  . Monocyte-Macrophage-Serous Fluid 05/13/2020 61  % Final  . Eos, Fluid 05/13/2020 4  % Final    Performed at [facility], [address] [place], [place] [address]  . Specimen Description 05/13/2020    Final                   Value:PLEURAL Performed at [facility], [address] [place],  [address]   . Special Requests 05/13/2020    Final                   Value:NONE Performed at [facility], [address] [place],  [address]   . Gram Stain 05/13/2020    Final  Value:NO WBC SEEN FEW YEAST CRITICAL RESULT CALLED TO, READ BACK BY AND VERIFIED WITH: A GLADENY RN 05/14/20 JDW Performed at [facility], [address] [place], [place] [address]   . Culture 05/13/2020 FEW CANDIDA ALBICANS   Final  . Report Status 05/13/2020 05/16/2020 FINAL   Final  . Cholesterol, Fluid 05/13/2020 111  mg/dL Final   Comment: (NOTE) INTERPRETIVE INFORMATION: Cholesterol, Body Fluid For information on body fluid reference ranges and/or interpretive guidance visit [email or web address] This test was developed and its performance characteristics determined by [employer]. It has not been cleared or approved by the [place] Food and Drug Administration. This test was performed in a CLIA certified laboratory and is intended for clinical purposes. Performed At: [facility] [address] [place], [place] [id number] Hilton Sinclair I MD [id number]   . Chol, Fluid Type 05/13/2020 CYTO PLEU   Final   Performed at [facility], [address] [place], [place] [address]  . Amylase, Fluid 05/13/2020 62  U/L Final   NO NORMAL RANGE ESTABLISHED FOR THIS TEST  . Fluid Type-FAMY 05/13/2020 CYTO PLEU   Final   Performed at [facility], [address] [place], [place] [address]  . Albumin, Fluid 05/13/2020 2.7  g/dL Final   Comment: (NOTE) No normal range established for this test Results should be evaluated in conjunction with serum values   . Fluid Type-FALB 05/13/2020 CYTO PLEU   Final    Performed at [facility], [address] [place], [place] [address]  . Total protein, fluid 05/13/2020 4.5  g/dL Final   Comment: (NOTE) No normal range established for this test Results should be evaluated in conjunction with serum values   . Fluid Type-FTP 05/13/2020 CYTO PLEU   Final   Performed at [facility], [address] [place], [place] [address]  . Glucose, Fluid 05/13/2020 79  mg/dL Final   Comment: (NOTE) No normal range established for this test Results should be evaluated in conjunction with serum values   . Fluid Type-FGLU 05/13/2020 CYTO PLEU   Final   Performed at [facility], [address] [place], [place] [address]  . PATH INTERP XXX-IMP 05/13/2020 Comment   Final   No significant immunophenotypic abnormality detected  . ANNOTATION COMMENT IMP 05/13/2020 Comment   Corrected   Cytopathologic correlation is required for diagnosis.  Marland Kitchen Specimen Type 05/13/2020 Comment   Final   Comment: (NOTE) Pleural fluid/chest cavity fluid *includes thoracentesis fluid and pleural effusion   . ASSESSMENT OF LEUKOCYTES 05/13/2020 Comment   Final   Comment: (NOTE) No monoclonal B cell population is detected. kappa:lambda ratio 1.8 There is no loss of, or aberrant expression of, the pan T cell antigens to suggest a neoplastic T cell process. CD4:CD8 ratio 2.7 Analysis of the viable lymphoid cells shows: B cells 1%, T cells 99%   . % Viable Cells 05/13/2020 Comment   Corrected   Comment: (NOTE) 63% Cell viability in this sample is sufficient for analysis but is less than optimal.   . ANALYSIS AND GATING STRATEGY 05/13/2020 AAD, CD45/SSC gating   Final   Comment: Comment 8 color analysis with 7   . IMMUNOPHENOTYPING STUDY 05/13/2020 Comment   Final   Comment: (NOTE) CD2       Normal         CD3       Normal CD4       Normal  CD5       Normal CD7       Normal         CD8       Normal CD10      Normal         CD11b      Normal CD19      Normal         CD20      Normal CD23      Normal         CD38      Normal CD45      Normal         CD56      Normal CD57      Normal         FMC-7     Normal HLA-DR    Normal         KAPPA     Normal LAMBDA    Normal   . PATHOLOGIST NAME 05/13/2020 Comment   Final   Henrietta Hoover, M.D.  . COMMENT: 05/13/2020 Comment   Corrected   Comment: (NOTE) Each antibody in this assay was utilized to assess for potential abnormalities of studied cell populations or to characterize identified abnormalities. This test was developed and its performance characteristics determined by [facility].  It has not been cleared or approved by the U.S. Food and Drug Administration. The FDA has determined that such clearance or approval is not necessary. This test is used for clinical purposes.  It should not be regarded as investigational or for research. Performed At: -[facility] [address] [place], [place] [id number] Katina Degree MDPhD [id number] Performed At: [facility] [address] [place], [place] [id number] Katina Degree MDPhD [id number]   . pH, Body Fluid 05/13/2020 7.5  Not Estab. Final   Comment: (NOTE) This test was developed and its performance characteristics determined by [facility]. It has not been cleared or approved by the Food and Drug Administration. The reference interval(s) and other method performance specifications have not been established for this body fluid. The test result must be integrated into the clinical context for interpretation. Performed At: [facility] [place], [place] [id number] Rush Farmer MD TD:[id number]   . Source 05/13/2020 PLEURAL   Final   Performed at [facility], [address] [place], [place] [address]  . AFB Specimen Processing 05/13/2020 Concentration   Final  . Acid Fast Smear 05/13/2020 Negative   Final   Comment: (NOTE) Performed At: [facility] [place], [place] [id number] Rush Farmer MD JK:[id number]   . Source (AFB) 05/13/2020 PLEURAL   Final   Performed at [facility] [address] [place], [place] [address]  . Troponin I (High Sensitivity) 05/13/2020 <2  <18 ng/L Final   Comment: (NOTE) Elevated high sensitivity troponin I (hsTnI) values and significant  changes across serial measurements may suggest ACS but many other  chronic and acute conditions are known to elevate hsTnI results.  Refer to the "Links" section for chest pain algorithms and additional  guidance. Performed at [facility], [address], [place], [place] [address]   . WBC 05/14/2020 11.1* 4.0 - 10.5 K/uL Final  . RBC 05/14/2020 4.15  3.87 - 5.11 MIL/uL Final  . Hemoglobin 05/14/2020 12.2  12.0 - 15.0 g/dL Final  . HCT 05/14/2020 37.6  36 - 46 % Final  . MCV 05/14/2020 90.6  80.0 - 100.0 fL  Final  . MCH 05/14/2020 29.4  26.0 - 34.0 pg Final  . MCHC 05/14/2020 32.4  30.0 - 36.0 g/dL Final  . RDW 05/14/2020 13.2  11.5 - 15.5 % Final  . Platelets 05/14/2020 220  150 - 400 K/uL Final  . nRBC 05/14/2020 0.0  0.0 - 0.2 % Final   Performed at [facility], [address], [place], [place] [address]  . Sodium 05/14/2020 135  135 - 145 mmol/L Final  . Potassium 05/14/2020 4.0  3.5 - 5.1 mmol/L Final  . Chloride 05/14/2020 102  98 - 111 mmol/L Final  . CO2 05/14/2020 24  22 - 32 mmol/L Final  . Glucose, Bld 05/14/2020 115* 70 - 99 mg/dL Final   Glucose reference range applies only to samples taken after fasting for at least 8 hours.  . BUN 05/14/2020 11  8 - 23 mg/dL Final  . Creatinine, Ser 05/14/2020 0.54  0.44 - 1.00 mg/dL Final  . Calcium 05/14/2020 8.3* 8.9 - 10.3 mg/dL Final  . Total Protein 05/14/2020 6.3* 6.5 - 8.1 g/dL Final  . Albumin 05/14/2020 3.4* 3.5 - 5.0 g/dL Final  . AST 05/14/2020 29  15 - 41 U/L Final  . ALT 05/14/2020 46* 0 - 44 U/L Final  . Alkaline Phosphatase 05/14/2020 39  38 - 126 U/L Final  .  Total Bilirubin 05/14/2020 1.1  0.3 - 1.2 mg/dL Final  . GFR calc non Af Amer 05/14/2020 >60  >60 mL/min Final  . GFR calc Af Amer 05/14/2020 >60  >60 mL/min Final  . Anion gap 05/14/2020 9  5 - 15 Final   Performed at [facility], [address], [place], [place] [address]  . LDH 05/14/2020 163  98 - 192 U/L Final   Performed at [facility], [address] [place], [place] [address]  . WBC 05/15/2020 8.4  4.0 - 10.5 K/uL Final  . RBC 05/15/2020 3.91  3.87 - 5.11 MIL/uL Final  . Hemoglobin 05/15/2020 11.6* 12.0 - 15.0 g/dL Final  . HCT 05/15/2020 34.7* 36 - 46 % Final  . MCV 05/15/2020 88.7  80.0 - 100.0 fL Final  . MCH 05/15/2020 29.7  26.0 - 34.0 pg Final  . MCHC 05/15/2020 33.4  30.0 - 36.0 g/dL Final  . RDW 05/15/2020 13.2  11.5 - 15.5 % Final  . Platelets 05/15/2020 192  150 - 400 K/uL Final  . nRBC 05/15/2020 0.0  0.0 - 0.2 % Final   Performed at [facility], [address], [place], [place] [address]    Assessment:  Elizabeth [name] is a 69 y.o. female with history of stage IA right breast cancer (2020) and stage IIIA right lung cancer (2021).  She presented with progressive shortness of breath and a large left pleural effusion.  Thoracentesis on 05/13/2020 revealed 1.5 L of blood-tinged pleural fluid.    She was diagnosed with right breast cancer.  She underwent lumpectomy and sentinel lymph node biopsy on 05/08/2019.  Pathologyrevealed a 1.1 cm grade IIinvasive mammary carcinoma with lobular features and adjacent DCIS.   OncotypeDXtestingrevealed a score of 10 (low risk).  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  She received concurrent chemotherapy (carboplatin and Taxol x 5 weekly cycles) and a truncated course of radiation (12 fractions: 01/14/2020 - 01/28/2020).  Chemotherapy completed on 02/09/2020.  She  has received consolidative immunotherapy with Imfinzi (durvalumab) from 03/03/2020 -  04/28/2020.   Foundation One on 12/22/2019 revealed PDL1 100% and BRAF V600E. In addition:  microsatellite status-stable, tumor mutational burden 8 Muts/Mb, BCORL1 [id number], IDH1 R132C, MLL2 R1918C, STAG2 [id number].  There were no reportable alterations in ALK, EGFR, ERRB2, KRAS, MET, RET, ROS1.  Chest CT angiogram on 05/12/2020 revealed a very small amount of acute pulmonary embolism within a lower lobe branch of the right pulmonary artery.  There was moderate to marked consolidation throughout the LLL with a moderate size left pleural effusion.  There were small slightly patchy focal areas of atelectasis and/or infiltrate within the anterolateral aspect of the RUL and posterior medial aspect of the RLL.  There was a 6 mm noncalcified lung nodule within the anterior aspect of the LUL.  There was stable well-defined areas of low-attenuation within the left lobe of the liver (possible hemangiomas).  Bilateral lower extremity duplex on 05/14/2020 revealed a DVT in the right calf.  There was thrombus involving the right peroneal veins.  There was no DVT in the left lower extremity.  She is on Eliquis.  The patient received the [place] COVID-19 vaccine on 11/12/2019 and 12/08/2019.  Symptomatically, she is feeling better s/p thoracentesis.  Exam reveals dullness at the bases.  Plan: 1.   Stage IIIA adenocarcinoma of the right lung  Patient is s/p concurrent chemotherapy (weekly carboplatin + Taxol) and a truncated course of radiation (completed 04/22//2021).  She received 3 months of durvalumab (last 04/28/2020).  Discuss concern for stage IV adenocarcinoma of the lung given recent malignant pleural effusion.   Cytology is pending.  Discuss chemotherapy options for likely metastatic adenocarcinoma of the lung.   Foundation 1 testing revealed PD-L1 100% and BRAF V600 E mutation.   Patient never received full dose systemic  chemotherapy (only weekly low-dose carboplatin and Taxol).   She did receive an anti PD-L1 monoclonal antibody (durvalumab).   Treatment options include immunotherapy and chemotherapy (carboplatin, Alimta, pembrolizumab) as well as oral targetted therapy (BRAF kinase inhibitor + MEK inhibitor).    Side effects of treatment are reviewed.  Discuss initiation of E23 and folic acid if immunotherapy + chemotherapy initiated.  Discuss additional staging studies including PET scan and head MRI.  Discuss port-a-cath placement.  Resent a tumor board. 2.   Left pleural effusion             Discuss concern for malignant effusion.             Cytology pending.             Discuss plan for follow-up PET scan in the outpatient department.             Discuss plan for possible future biopsy if cytology is negative and PET scan suggestive of metastatic disease.  Consider PleurX catheter or possible pleurodesis if effusion recurrent and not controlled following initiation of therapy. 3.   Stage IA right breast cancer Clinically,she appears to be doing well.. Exam reveals no evidence of recurrent disease.    She is s/p lumpectomy and SLN biopsy (05/08/2019). She completed radiation on 07/29/2019. She began tamoxifen on 08/04/2019.              Schedule bilateral diagnostic mammogram. 4.   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             Risk of thromboembolism with tamoxifen approximately 5%.  Continue Eliquis. 5.   B12 today.  Begin folic acid 1 mg a day. 6.   Preauth carboplatin, Alimta, pembrolizumab. 7.   PET scan scheduled for 05/24/2020. 8.   Head MRI on 05/23/2020. 9.   Bilateral diagnostic mammogram. 10.   Port placement by Dr Dahlia Byes. 11.   Tumor board on 05/26/2020. 12.   RTC in 1 week for MD assessment, labs (CBC with diff, CMP, Mg, CEA, CA27.29), and cycle #1 carboplatin, Alimta, and  pembrolzumab.  I discussed the assessment and treatment plan with the patient.  The patient was provided an opportunity to ask questions and all were answered.  The patient agreed with the plan and demonstrated an understanding of the instructions.  The patient was advised to call back if the symptoms worsen or if the condition fails to improve as anticipated.  I provided 24 minutes of face-to-face time during this this encounter and > 50% was spent counseling as documented under my assessment and plan.  An additional 15 minutes were spent reviewing her chart (Epic and Stewart) including notes, labs, imaging studies, coordinating care, and writing chemotherapy.    Israa Caban C. Mike Gip, MD, PhD    05/18/2020, 12:23 PM  I, De Burrs, am acting as Education administrator for [employer]. Mike Gip, MD, PhD.  I, Valarie Farace C. Mike Gip, MD, have reviewed the above documentation for accuracy and completeness, and I agree with the above.

## 2020-05-18 ENCOUNTER — Inpatient Hospital Stay: Payer: Federal, State, Local not specified - PPO

## 2020-05-18 ENCOUNTER — Telehealth (HOSPITAL_COMMUNITY): Payer: Self-pay | Admitting: *Deleted

## 2020-05-18 ENCOUNTER — Other Ambulatory Visit: Payer: Self-pay

## 2020-05-18 ENCOUNTER — Inpatient Hospital Stay
Payer: Federal, State, Local not specified - PPO | Attending: Hematology and Oncology | Admitting: Hematology and Oncology

## 2020-05-18 ENCOUNTER — Encounter: Payer: Self-pay | Admitting: Hematology and Oncology

## 2020-05-18 VITALS — BP 110/58 | HR 87 | Temp 97.8°F | Resp 18 | Wt 174.2 lb

## 2020-05-18 DIAGNOSIS — C3431 Malignant neoplasm of lower lobe, right bronchus or lung: Secondary | ICD-10-CM | POA: Insufficient documentation

## 2020-05-18 DIAGNOSIS — Z79899 Other long term (current) drug therapy: Secondary | ICD-10-CM | POA: Diagnosis not present

## 2020-05-18 DIAGNOSIS — C3491 Malignant neoplasm of unspecified part of right bronchus or lung: Secondary | ICD-10-CM

## 2020-05-18 DIAGNOSIS — D509 Iron deficiency anemia, unspecified: Secondary | ICD-10-CM | POA: Diagnosis not present

## 2020-05-18 DIAGNOSIS — I824Z1 Acute embolism and thrombosis of unspecified deep veins of right distal lower extremity: Secondary | ICD-10-CM

## 2020-05-18 DIAGNOSIS — J910 Malignant pleural effusion: Secondary | ICD-10-CM

## 2020-05-18 DIAGNOSIS — I2693 Single subsegmental pulmonary embolism without acute cor pulmonale: Secondary | ICD-10-CM

## 2020-05-18 DIAGNOSIS — C50411 Malignant neoplasm of upper-outer quadrant of right female breast: Secondary | ICD-10-CM

## 2020-05-18 DIAGNOSIS — Z170 Estrogen receptor positive status [ER+]: Secondary | ICD-10-CM | POA: Diagnosis not present

## 2020-05-18 DIAGNOSIS — Z79811 Long term (current) use of aromatase inhibitors: Secondary | ICD-10-CM | POA: Diagnosis not present

## 2020-05-18 DIAGNOSIS — Z7189 Other specified counseling: Secondary | ICD-10-CM

## 2020-05-18 DIAGNOSIS — R978 Other abnormal tumor markers: Secondary | ICD-10-CM | POA: Insufficient documentation

## 2020-05-18 DIAGNOSIS — M810 Age-related osteoporosis without current pathological fracture: Secondary | ICD-10-CM

## 2020-05-18 LAB — CHOLESTEROL, BODY FLUID: Cholesterol, Fluid: 111 mg/dL

## 2020-05-18 LAB — CYTOLOGY - NON PAP

## 2020-05-18 MED ORDER — CYANOCOBALAMIN 1000 MCG/ML IJ SOLN
1000.0000 ug | Freq: Once | INTRAMUSCULAR | Status: AC
  Administered 2020-05-18: 1000 ug via INTRAMUSCULAR

## 2020-05-18 MED ORDER — CYANOCOBALAMIN 1000 MCG/ML IJ SOLN
INTRAMUSCULAR | Status: AC
  Filled 2020-05-18: qty 1

## 2020-05-18 NOTE — Progress Notes (Signed)
Patient reports constipation. She had back pain last night. She was recently d/c from the hospital (SOB, blood clot, fluid in lungs). Her right foot is swollen.

## 2020-05-18 NOTE — Telephone Encounter (Signed)
Reaching out to patient to offer assistance regarding upcoming cardiac imaging study.  Pt is a reschedule and went to the hospital after a cardiac CT attempt on 05/12/20.  Pt was recently discharged and would like to be re-scheduled to 06/02/20.  Pt made aware that someone from scheduling would reach out to her.  Burley Saver RN Navigator Cardiac Imaging [facility] [phone] office [phone] cell

## 2020-05-18 NOTE — Patient Instructions (Addendum)
Begin folic acid 1 mg a day.   Pembrolizumab injection What is this medicine? PEMBROLIZUMAB (pem broe liz ue mab) is a monoclonal antibody. It is used to treat certain types of cancer. This medicine may be used for other purposes; ask your health care provider or pharmacist if you have questions. COMMON BRAND NAME(S): Keytruda What should I tell my health care provider before I take this medicine? They need to know if you have any of these conditions:  diabetes  immune system problems  inflammatory bowel disease  liver disease  lung or breathing disease  lupus  received or scheduled to receive an organ transplant or a stem-cell transplant that uses donor stem cells  an unusual or allergic reaction to pembrolizumab, other medicines, foods, dyes, or preservatives  pregnant or trying to get pregnant  breast-feeding How should I use this medicine? This medicine is for infusion into a vein. It is given by a health care professional in a hospital or clinic setting. A special MedGuide will be given to you before each treatment. Be sure to read this information carefully each time. Talk to your pediatrician regarding the use of this medicine in children. While this drug may be prescribed for children as young as 6 months for selected conditions, precautions do apply. Overdosage: If you think you have taken too much of this medicine contact a poison control center or emergency room at once. NOTE: This medicine is only for you. Do not share this medicine with others. What if I miss a dose? It is important not to miss your dose. Call your doctor or health care professional if you are unable to keep an appointment. What may interact with this medicine? Interactions have not been studied. Give your health care provider a list of all the medicines, herbs, non-prescription drugs, or dietary supplements you use. Also tell them if you smoke, drink alcohol, or use illegal drugs. Some items  may interact with your medicine. This list may not describe all possible interactions. Give your health care provider a list of all the medicines, herbs, non-prescription drugs, or dietary supplements you use. Also tell them if you smoke, drink alcohol, or use illegal drugs. Some items may interact with your medicine. What should I watch for while using this medicine? Your condition will be monitored carefully while you are receiving this medicine. You may need blood work done while you are taking this medicine. Do not become pregnant while taking this medicine or for 4 months after stopping it. Women should inform their doctor if they wish to become pregnant or think they might be pregnant. There is a potential for serious side effects to an unborn child. Talk to your health care professional or pharmacist for more information. Do not breast-feed an infant while taking this medicine or for 4 months after the last dose. What side effects may I notice from receiving this medicine? Side effects that you should report to your doctor or health care professional as soon as possible:  allergic reactions like skin rash, itching or hives, swelling of the face, lips, or tongue  bloody or black, tarry  breathing problems  changes in vision  chest pain  chills  confusion  constipation  cough  diarrhea  dizziness or feeling faint or lightheaded  fast or irregular heartbeat  fever  flushing  joint pain  low blood counts - this medicine may decrease the number of white blood cells, red blood cells and platelets. You may be at increased risk  for infections and bleeding.  muscle pain  muscle weakness  pain, tingling, numbness in the hands or feet  persistent headache  redness, blistering, peeling or loosening of the skin, including inside the mouth  signs and symptoms of high blood sugar such as dizziness; dry mouth; dry skin; fruity breath; nausea; stomach pain; increased hunger  or thirst; increased urination  signs and symptoms of kidney injury like trouble passing urine or change in the amount of urine  signs and symptoms of liver injury like dark urine, light-colored stools, loss of appetite, nausea, right upper belly pain, yellowing of the eyes or skin  sweating  swollen lymph nodes  weight loss Side effects that usually do not require medical attention (report to your doctor or health care professional if they continue or are bothersome):  decreased appetite  hair loss  muscle pain  tiredness This list may not describe all possible side effects. Call your doctor for medical advice about side effects. You may report side effects to FDA at 1-800-FDA-1088. Where should I keep my medicine? This drug is given in a hospital or clinic and will not be stored at home. NOTE: This sheet is a summary. It may not cover all possible information. If you have questions about this medicine, talk to your doctor, pharmacist, or health care provider.  2020 Elsevier/Gold Standard (2019-07-31 18:07:58)    Carboplatin injection What is this medicine? CARBOPLATIN (KAR boe pla tin) is a chemotherapy drug. It targets fast dividing cells, like cancer cells, and causes these cells to die. This medicine is used to treat ovarian cancer and many other cancers. This medicine may be used for other purposes; ask your health care provider or pharmacist if you have questions. COMMON BRAND NAME(S): Paraplatin What should I tell my health care provider before I take this medicine? They need to know if you have any of these conditions:  blood disorders  hearing problems  kidney disease  recent or ongoing radiation therapy  an unusual or allergic reaction to carboplatin, cisplatin, other chemotherapy, other medicines, foods, dyes, or preservatives  pregnant or trying to get pregnant  breast-feeding How should I use this medicine? This drug is usually given as an infusion  into a vein. It is administered in a hospital or clinic by a specially trained health care professional. Talk to your pediatrician regarding the use of this medicine in children. Special care may be needed. Overdosage: If you think you have taken too much of this medicine contact a poison control center or emergency room at once. NOTE: This medicine is only for you. Do not share this medicine with others. What if I miss a dose? It is important not to miss a dose. Call your doctor or health care professional if you are unable to keep an appointment. What may interact with this medicine?  medicines for seizures  medicines to increase blood counts like filgrastim, pegfilgrastim, sargramostim  some antibiotics like amikacin, gentamicin, neomycin, streptomycin, tobramycin  vaccines Talk to your doctor or health care professional before taking any of these medicines:  acetaminophen  aspirin  ibuprofen  ketoprofen  naproxen This list may not describe all possible interactions. Give your health care provider a list of all the medicines, herbs, non-prescription drugs, or dietary supplements you use. Also tell them if you smoke, drink alcohol, or use illegal drugs. Some items may interact with your medicine. What should I watch for while using this medicine? Your condition will be monitored carefully while you are receiving  this medicine. You will need important blood work done while you are taking this medicine. This drug may make you feel generally unwell. This is not uncommon, as chemotherapy can affect healthy cells as well as cancer cells. Report any side effects. Continue your course of treatment even though you feel ill unless your doctor tells you to stop. In some cases, you may be given additional medicines to help with side effects. Follow all directions for their use. Call your doctor or health care professional for advice if you get a fever, chills or sore throat, or other symptoms  of a cold or flu. Do not treat yourself. This drug decreases your body's ability to fight infections. Try to avoid being around people who are sick. This medicine may increase your risk to bruise or bleed. Call your doctor or health care professional if you notice any unusual bleeding. Be careful brushing and flossing your teeth or using a toothpick because you may get an infection or bleed more easily. If you have any dental work done, tell your dentist you are receiving this medicine. Avoid taking products that contain aspirin, acetaminophen, ibuprofen, naproxen, or ketoprofen unless instructed by your doctor. These medicines may hide a fever. Do not become pregnant while taking this medicine. Women should inform their doctor if they wish to become pregnant or think they might be pregnant. There is a potential for serious side effects to an unborn child. Talk to your health care professional or pharmacist for more information. Do not breast-feed an infant while taking this medicine. What side effects may I notice from receiving this medicine? Side effects that you should report to your doctor or health care professional as soon as possible:  allergic reactions like skin rash, itching or hives, swelling of the face, lips, or tongue  signs of infection - fever or chills, cough, sore throat, pain or difficulty passing urine  signs of decreased platelets or bleeding - bruising, pinpoint red spots on the skin, black, tarry stools, nosebleeds  signs of decreased red blood cells - unusually weak or tired, fainting spells, lightheadedness  breathing problems  changes in hearing  changes in vision  chest pain  high blood pressure  low blood counts - This drug may decrease the number of white blood cells, red blood cells and platelets. You may be at increased risk for infections and bleeding.  nausea and vomiting  pain, swelling, redness or irritation at the injection site  pain, tingling,  numbness in the hands or feet  problems with balance, talking, walking  trouble passing urine or change in the amount of urine Side effects that usually do not require medical attention (report to your doctor or health care professional if they continue or are bothersome):  hair loss  loss of appetite  metallic taste in the mouth or changes in taste This list may not describe all possible side effects. Call your doctor for medical advice about side effects. You may report side effects to FDA at 1-800-FDA-1088. Where should I keep my medicine? This drug is given in a hospital or clinic and will not be stored at home. NOTE: This sheet is a summary. It may not cover all possible information. If you have questions about this medicine, talk to your doctor, pharmacist, or health care provider.  2020 Elsevier/Gold Standard (2007-12-30 14:38:05)  Pemetrexed injection What is this medicine? PEMETREXED (PEM e TREX ed) is a chemotherapy drug used to treat lung cancers like non-small cell lung cancer and mesothelioma.  It may also be used to treat other cancers. This medicine may be used for other purposes; ask your health care provider or pharmacist if you have questions. COMMON BRAND NAME(S): Alimta What should I tell my health care provider before I take this medicine? They need to know if you have any of these conditions:  infection (especially a virus infection such as chickenpox, cold sores, or herpes)  kidney disease  low blood counts, like low white cell, platelet, or red cell counts  lung or breathing disease, like asthma  radiation therapy  an unusual or allergic reaction to pemetrexed, other medicines, foods, dyes, or preservative  pregnant or trying to get pregnant  breast-feeding How should I use this medicine? This drug is given as an infusion into a vein. It is administered in a hospital or clinic by a specially trained health care professional. Talk to your pediatrician  regarding the use of this medicine in children. Special care may be needed. Overdosage: If you think you have taken too much of this medicine contact a poison control center or emergency room at once. NOTE: This medicine is only for you. Do not share this medicine with others. What if I miss a dose? It is important not to miss your dose. Call your doctor or health care professional if you are unable to keep an appointment. What may interact with this medicine? This medicine may interact with the following medications:  Ibuprofen This list may not describe all possible interactions. Give your health care provider a list of all the medicines, herbs, non-prescription drugs, or dietary supplements you use. Also tell them if you smoke, drink alcohol, or use illegal drugs. Some items may interact with your medicine. What should I watch for while using this medicine? Visit your doctor for checks on your progress. This drug may make you feel generally unwell. This is not uncommon, as chemotherapy can affect healthy cells as well as cancer cells. Report any side effects. Continue your course of treatment even though you feel ill unless your doctor tells you to stop. In some cases, you may be given additional medicines to help with side effects. Follow all directions for their use. Call your doctor or health care professional for advice if you get a fever, chills or sore throat, or other symptoms of a cold or flu. Do not treat yourself. This drug decreases your body's ability to fight infections. Try to avoid being around people who are sick. This medicine may increase your risk to bruise or bleed. Call your doctor or health care professional if you notice any unusual bleeding. Be careful brushing and flossing your teeth or using a toothpick because you may get an infection or bleed more easily. If you have any dental work done, tell your dentist you are receiving this medicine. Avoid taking products that  contain aspirin, acetaminophen, ibuprofen, naproxen, or ketoprofen unless instructed by your doctor. These medicines may hide a fever. Call your doctor or health care professional if you get diarrhea or mouth sores. Do not treat yourself. To protect your kidneys, drink water or other fluids as directed while you are taking this medicine. Do not become pregnant while taking this medicine or for 6 months after stopping it. Women should inform their doctor if they wish to become pregnant or think they might be pregnant. Men should not father a child while taking this medicine and for 3 months after stopping it. This may interfere with the ability to father a child. You  should talk to your doctor or health care professional if you are concerned about your fertility. There is a potential for serious side effects to an unborn child. Talk to your health care professional or pharmacist for more information. Do not breast-feed an infant while taking this medicine or for 1 week after stopping it. What side effects may I notice from receiving this medicine? Side effects that you should report to your doctor or health care professional as soon as possible:  allergic reactions like skin rash, itching or hives, swelling of the face, lips, or tongue  breathing problems  redness, blistering, peeling or loosening of the skin, including inside the mouth  signs and symptoms of bleeding such as bloody or black, tarry stools; red or dark-brown urine; spitting up blood or brown material that looks like coffee grounds; red spots on the skin; unusual bruising or bleeding from the eye, gums, or nose  signs and symptoms of infection like fever or chills; cough; sore throat; pain or trouble passing urine  signs and symptoms of kidney injury like trouble passing urine or change in the amount of urine  signs and symptoms of liver injury like dark yellow or brown urine; general ill feeling or flu-like symptoms; light-colored  stools; loss of appetite; nausea; right upper belly pain; unusually weak or tired; yellowing of the eyes or skin Side effects that usually do not require medical attention (report to your doctor or health care professional if they continue or are bothersome):  constipation  mouth sores  nausea, vomiting  unusually weak or tired This list may not describe all possible side effects. Call your doctor for medical advice about side effects. You may report side effects to FDA at 1-800-FDA-1088. Where should I keep my medicine? This drug is given in a hospital or clinic and will not be stored at home. NOTE: This sheet is a summary. It may not cover all possible information. If you have questions about this medicine, talk to your doctor, pharmacist, or health care provider.  2020 Elsevier/Gold Standard (2017-11-13 16:11:33)

## 2020-05-19 ENCOUNTER — Ambulatory Visit: Admission: RE | Admit: 2020-05-19 | Payer: Federal, State, Local not specified - PPO | Source: Ambulatory Visit

## 2020-05-19 ENCOUNTER — Other Ambulatory Visit: Payer: Self-pay

## 2020-05-19 DIAGNOSIS — C3491 Malignant neoplasm of unspecified part of right bronchus or lung: Secondary | ICD-10-CM

## 2020-05-19 DIAGNOSIS — Z170 Estrogen receptor positive status [ER+]: Secondary | ICD-10-CM

## 2020-05-19 DIAGNOSIS — C50411 Malignant neoplasm of upper-outer quadrant of right female breast: Secondary | ICD-10-CM

## 2020-05-23 ENCOUNTER — Encounter: Payer: Self-pay | Admitting: Hematology and Oncology

## 2020-05-23 ENCOUNTER — Other Ambulatory Visit: Payer: Self-pay | Admitting: Anatomic Pathology & Clinical Pathology

## 2020-05-23 IMAGING — MR MR BILATERAL BREAST WITHOUT AND WITH CONTRAST
2 of 9 series · 6 of 48 positions shown · IV contrast (Gadavist)
Comparison: 04/27/2019

CLINICAL DATA: Stereotactic guided core biopsy of distortion in the
RIGHT breast performed on 04/27/2019 shows invasive mammary
carcinoma with ductal and lobular features grade 2 and ductal
carcinoma in situ. History of benign excisional biopsy in the RIGHT
breast.

LABS:  None obtained at the time of imaging.
EXAM:
BILATERAL BREAST MRI WITH AND WITHOUT CONTRAST
TECHNIQUE: Multiplanar, multisequence MR images of both breasts were obtained
prior to and following the intravenous administration of 7 ml of
Gadavist

[Series 2: T1 · axial · B · 1.5mm · 1.02mm/px · z∈[-86,+81]mm · 5 of 112 slices shown]
[im 1/112]
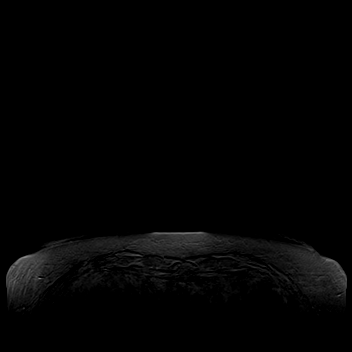
[im 28/112]
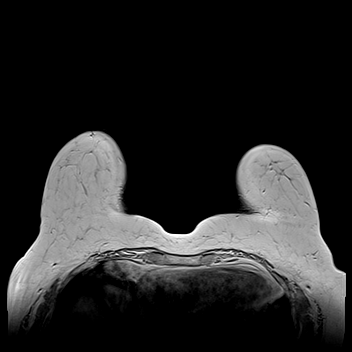
[im 56/112]
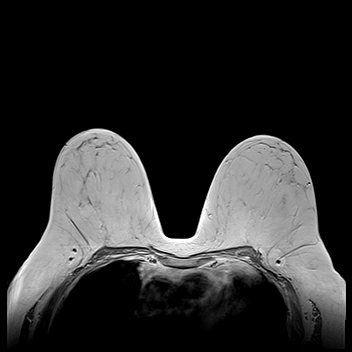
[im 84/112]
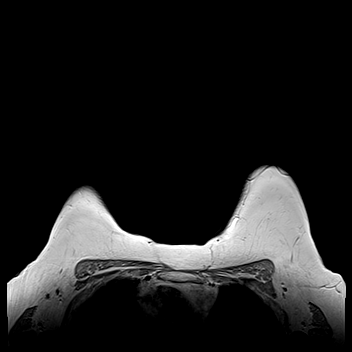
[im 112/112]
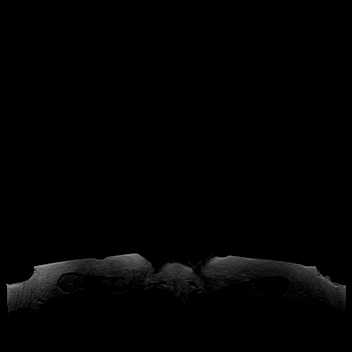

[Series 3: T2 · axial · B · 3.0mm · 1.02mm/px · 1 of 46 slices shown]
[im 1/46]
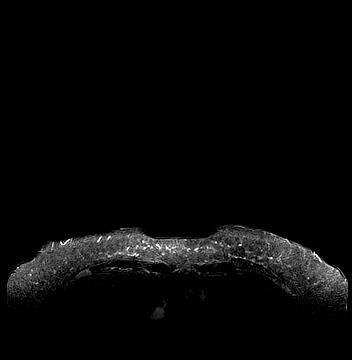

[6 of 48 positions shown; findings below may reference images not displayed]

Three-dimensional MR images were rendered by post-processing of the
original MR data on an independent workstation. The
three-dimensional MR images were interpreted, and findings are
reported in the following complete MRI report for this study. Three
dimensional images were evaluated at the independent [employer]
workstation
FINDINGS: Breast composition: b. Scattered fibroglandular tissue.

Background parenchymal enhancement: Minimal

Right breast: Within the LATERAL central portion of the RIGHT breast
there is post biopsy change and artifact from tissue marker clip.
There is an associated rounded mass with irregular margins and
plateau type enhancement kinetics. This mass measures 1.1 x 0.9 x
1.1 centimeters and is consistent with known malignancy in this
portion of the breast. LATERAL to this mass there is enhancing
biopsy tract. No other abnormalities are identified in the RIGHT
breast.

Left breast: No mass or abnormal enhancement.

Lymph nodes: No abnormal appearing lymph nodes.

Ancillary findings:  None.
IMPRESSION: Known RIGHT breast malignancy in the LATERAL midportion of the RIGHT
breast, measuring 1.1 centimeters. There is associated biopsy
change.

LEFT breast is negative.

No evidence for adenopathy.

RECOMMENDATION:
Treatment plan for known malignancy.

BI-RADS CATEGORY  6: Known biopsy-proven malignancy.

## 2020-05-23 NOTE — Progress Notes (Addendum)
[facility]  [address], [address] [place], [place] [address] Phone: [phone]  Fax: [phone]   Clinic Day:  05/25/2020  Referring physician: Birdie Sons, MD  Chief Complaint: Elizabeth [name] is a 69 y.o. female with stage IV adenocarcinoma of the right lung and stage IA invasive breast cancer who is seen for 1 week assessment and discussion regarding direction of therapy.  HPI:  The patient was last seen in the medical oncology clinic on 05/18/2020. At that time, she was seen for assessment after recent discharge from the hospital. She presented with shortness of breath and a large left pleural effusion.  Chest CT angiogram also revealed a small pulmonary embolism.  Lower extremity duplex revealed a right calf DVT.  She was started on Eliquis.  Thoracentesis revealed a 1.5 L of fluid. Cytology confirmed adenocarcinoma.  We discussed the likelihood that the fluid represented adenocarcinoma of the lung.  IHC was pending.  We discussed chemotherapy consisting of carboplatin, Alimta, and pembrolizumab.  She received B12 and began oral folic acid.  Pleural fluid cytology was positive for adenocarcinoma.  There were abundant cohesive groups of large neoplastic cells present. Immunohistochemistry (IHC) was positive for TTF1, with 75% of cells showing moderate to strong nuclear staining.  They were negative for GATA3. The IHC profile was consistent with adenocarcinoma of lung origin. The IHC result matched that reported for the thoracic lymph node metastases ([id number], 12/22/2019).   The patient sent a message via [place] on 05/23/2020 asking for a referral to the [facility] in [place], [place] for treatment.  The patient cancelled her cardiac CT and head MRI.   PET scan on 05/24/2020 revealed progressive thoracic lymphadenopathy, compatible with nodal metastases.  There was moderate left pleural effusion, likely malignant. There was  associated left lower lobe collapse with hypermetabolism, raising the possibility of underlying infection/neoplasm.  There was additional patchy/nodular opacities in the right lung with trace right pleural effusion.  During the interim, she has been "ok".  She states that she woke up this morning and was very short of breath. She has left sided back pain/pressure and would like pain medication. She has orthopnea (props herself up at a 45 degree angle to sleep).  She denies ankle swelling and blurred vision.  The patient cancelled her head MRI because she had bad diarrhea and says that it was very difficult for her to lay on her back for her PET scan.  She has been taking folic acid. She took Eliquis last night but has not taken her morning dose today. She has an appointment with Dr Dahlia Byes today at 1:30 pm to discuss port placement. She is interested in repeat thoracentesis.  The patient would like to transfer care to [facility] in [place], [place].  She has an appointment on 06/14/2020 with Dr. Nichola Sizer.     Past Medical History:  Diagnosis Date  . Anginal pain ([place])   . Anxiety   . Breast cancer, right ([place]) 04/2019   breast cancer (radical lumpectomy and radiation  . Complication of anesthesia    one time woke when she had a tube down throat and had a panic attack  . Constipation due to opioid therapy   . Degenerative joint disease (DJD) of hip   . GERD (gastroesophageal reflux disease)   . Headache    migraines when she was working  . Osteopenia   . Pneumonia   . Restless leg syndrome    takes  Mirapex and xanax  . Scoliosis   . SUI (stress urinary incontinence, female)   . Vaso-vagal reaction    after back surgery    Past Surgical History:  Procedure Laterality Date  . APPENDECTOMY  1963  . BACK SURGERY    . BREAST BIOPSY Right 04/27/2019   Affirm Bx "X" clip-INVASIVE MAMMARY CARCINOMA,  . BREAST CYST ASPIRATION Right 1988  . BREAST EXCISIONAL BIOPSY Right 1990    benign x 3  . BREAST LUMPECTOMY Right 05/08/2019  . BREAST LUMPECTOMY WITH SENTINEL LYMPH NODE BIOPSY Right 05/08/2019   Procedure: RIGHT BREAST LUMPECTOMY WITH SENTINEL LYMPH NODE BX AND WIRE LOC., LATEX ALLERGY;  Surgeon: Jules Husbands, MD;  Location: [facility];  Service: General;  Laterality: Right;  . BREAST SURGERY    . CATARACT EXTRACTION W/PHACO Right 03/31/2018   Procedure: CATARACT EXTRACTION PHACO AND INTRAOCULAR LENS PLACEMENT ([place]) RIGHT;  Surgeon: Leandrew Koyanagi, MD;  Location: [facility];  Service: Ophthalmology;  Laterality: Right;  Latex sensitivity  . CATARACT EXTRACTION W/PHACO Left 04/16/2018   Procedure: CATARACT EXTRACTION PHACO AND INTRAOCULAR LENS PLACEMENT ([place])  LEFT;  Surgeon: Leandrew Koyanagi, MD;  Location: [facility];  Service: Ophthalmology;  Laterality: Left;  . EYE SURGERY     Lasik  . FINE NEEDLE ASPIRATION  12/22/2019   Procedure: FINE NEEDLE ASPIRATION (FNA) LINEAR;  Surgeon: Collene Gobble, MD;  Location: [facility];  Service: Pulmonary;;  . FOOT SURGERY Bilateral   . HYSTEROTOMY    . LAMINECTOMY  2006  . ROTATOR CUFF REPAIR Bilateral   . TONSILLECTOMY     age 89  . TOTAL HIP ARTHROPLASTY Right 01/18/2015   Procedure: RIGHT TOTAL HIP ARTHROPLASTY ANTERIOR APPROACH;  Surgeon: Renette Butters, MD;  Location: [facility];  Service: Orthopedics;  Laterality: Right;  . TUBAL LIGATION    . VIDEO BRONCHOSCOPY WITH ENDOBRONCHIAL ULTRASOUND N/A 12/22/2019   Procedure: VIDEO BRONCHOSCOPY WITH ENDOBRONCHIAL ULTRASOUND;  Surgeon: Collene Gobble, MD;  Location[facility];  Service: Pulmonary;  Laterality: N/A;    Family History  Problem Relation Age of Onset  . Alcohol abuse Mother   . Cancer Mother 45       lung  . Migraines Mother   . Cancer Father 63       prostate  . Diabetes Father   . Alcohol abuse Brother   . Cancer Brother        skin  . Cancer Daughter        breast  . Breast cancer Daughter 26  . Bladder Cancer Neg  Hx   . Kidney cancer Neg Hx     Social History:  reports that she quit smoking about 8 years ago. Her smoking use included cigarettes. She has a 10.00 pack-year smoking history. She has never used smokeless tobacco. She reports current alcohol use of about 5.0 standard drinks of alcohol per week. She reports that she does not use drugs. She quit smoking about 8 years ago. Her smoking use included cigarettes. She has a 10.00 pack-year smoking history. She has never used smokeless tobacco. She reports current alcohol use of about 5.0 standard drinks of alcohol per week. She reports that she does not use drugs.  The patient denies any exposure to radiation or toxins.  The patient lives in [place].  She is accompanied by her husband, Elizabeth [name], today.  Allergies:  Allergies  Allergen Reactions  . Zostavax [Zoster Vaccine Live] Rash  . Penicillins Hives    Did  it involve swelling of the face/tongue/throat, SOB, or low BP? No Did it involve sudden or severe rash/hives, skin peeling, or any reaction on the inside of your mouth or nose? Yes Did you need to seek medical attention at a hospital or doctor's office? Yes When did it last happen?14 or 15 If all above answers are "NO", may proceed with cephalosporin use.   . Naproxen Hives  . Zoloft [Sertraline Hcl] Hives  . Latex Itching and Rash    Condoms    Current Medications: Current Outpatient Medications  Medication Sig Dispense Refill  . albuterol (VENTOLIN HFA) 108 (90 Base) MCG/ACT inhaler Inhale 2 puffs into the lungs every 6 (six) hours as needed for wheezing or shortness of breath. 8 g 2  . ALPRAZolam (XANAX) 0.5 MG tablet Take 3 tablets (1.5 mg total) by mouth at bedtime.    Marland Kitchen apixaban (ELIQUIS) 5 MG TABS tablet Take 1 tablet (5 mg total) by mouth 2 (two) times daily. 60 tablet 2  . calcium-vitamin D (OSCAL WITH D) 500-200 MG-UNIT tablet Take 1 tablet by mouth daily.    . famotidine (PEPCID) 20 MG tablet One after supper 30 tablet  11  . Multiple Vitamin (MULTIVITAMIN WITH MINERALS) TABS tablet Take 1 tablet by mouth daily.    . pantoprazole (PROTONIX) 40 MG tablet Take 1 tablet (40 mg total) by mouth daily. Take 30-60 min before first meal of the day 30 tablet 2  . pramipexole (MIRAPEX) 1 MG tablet TAKE 1 TABLET BY MOUTH AT BEDTIME (Patient taking differently: Take 1 mg by mouth at bedtime. ) 30 tablet 12  . tamoxifen (NOLVADEX) 20 MG tablet Take 1 tablet (20 mg total) by mouth daily. 90 tablet 3  . apixaban (ELIQUIS) 5 MG TABS tablet Take 2 tablets (10 mg total) by mouth 2 (two) times daily for 5 days. (Patient not taking: Reported on 05/25/2020) 20 tablet 0  . clindamycin (CLEOCIN) 150 MG capsule Take 450 mg by mouth See admin instructions. Take before dental procedures  (Patient not taking: Reported on 05/18/2020)    . ivabradine (CORLANOR) 5 MG TABS tablet Take 2 hours prior to cardiac CT along with metoprolol ('100mg'$  tablet) (Patient not taking: Reported on 05/18/2020) 2 tablet 0  . metoprolol tartrate (LOPRESSOR) 100 MG tablet Take 1 tablet (100 mg total) by mouth once for 1 dose. Take 2 hours prior to your CT scan. (Patient not taking: Reported on 05/25/2020) 1 tablet 0  . nitroGLYCERIN (NITROSTAT) 0.4 MG SL tablet Place 1 tablet (0.4 mg total) under the tongue every 5 (five) minutes as needed for chest pain. Go to ER if third tablet is necessary (Patient not taking: Reported on 05/18/2020) 30 tablet 5  . valACYclovir (VALTREX) 500 MG tablet Take 1 tablet (500 mg total) by mouth daily as needed (Cold sores). (Patient not taking: Reported on 05/18/2020)     No current facility-administered medications for this visit.    Review of Systems  Constitutional: Positive for weight loss (5 lbs). Negative for chills, diaphoresis, fever and malaise/fatigue.  HENT: Negative for congestion, ear discharge, ear pain, hearing loss, nosebleeds, sinus pain, sore throat and tinnitus.   Eyes: Negative for blurred vision and double vision.   Respiratory: Positive for shortness of breath. Negative for cough, hemoptysis and sputum production.   Cardiovascular: Positive for orthopnea (has adjustable bed). Negative for chest pain, palpitations and leg swelling.  Gastrointestinal: Negative for abdominal pain, blood in stool, constipation, diarrhea, heartburn, melena, nausea and vomiting.  Genitourinary:  Negative for dysuria, frequency, hematuria and urgency.  Musculoskeletal: Positive for back pain (left side). Negative for joint pain, myalgias and neck pain.  Skin: Negative for itching and rash.  Neurological: Negative for dizziness, tingling, sensory change, weakness and headaches.  Endo/Heme/Allergies: Does not bruise/bleed easily.  Psychiatric/Behavioral: Negative for depression and memory loss. The patient is not nervous/anxious and does not have insomnia.   All other systems reviewed and are negative.  Performance status (ECOG): 1  Vitals Blood pressure 95/60, pulse 96, temperature (!) 97.3 F (36.3 C), temperature source Tympanic, resp. rate 18, height _0  (1.575 m), weight 169 lb 10.3 oz (76.9 kg), SpO2 100 %.   Physical Exam Vitals and nursing note reviewed.  Constitutional:      General: She is not in acute distress.    Appearance: She is not toxic-appearing or diaphoretic.     Interventions: Face mask in place.  HENT:     Head: Normocephalic and atraumatic.     Comments: Long dark hair.    Mouth/Throat:     Mouth: Mucous membranes are moist.     Pharynx: Oropharynx is clear.  Eyes:     General: No scleral icterus.    Extraocular Movements: Extraocular movements intact.     Conjunctiva/sclera: Conjunctivae normal.     Pupils: Pupils are equal, round, and reactive to light.     Comments: Brown eyes.  Cardiovascular:     Rate and Rhythm: Normal rate and regular rhythm.     Heart sounds: Normal heart sounds. No murmur heard.   Pulmonary:     Effort: Pulmonary effort is normal. No respiratory distress.      Breath sounds: No wheezing or rales.     Comments: Fluid 1/3 of the way up on the left side. Abdominal:     General: Bowel sounds are normal. There is no distension.     Palpations: Abdomen is soft. There is no hepatomegaly, splenomegaly or mass.     Tenderness: There is no abdominal tenderness. There is no guarding or rebound.  Musculoskeletal:        General: No swelling or tenderness. Normal range of motion.     Cervical back: Normal range of motion and neck supple.  Lymphadenopathy:     Head:     Right side of head: No preauricular, posterior auricular or occipital adenopathy.     Left side of head: No preauricular, posterior auricular or occipital adenopathy.     Cervical: No cervical adenopathy.     Upper Body:     Right upper body: No supraclavicular adenopathy.     Left upper body: No supraclavicular adenopathy.     Lower Body: No right inguinal adenopathy. No left inguinal adenopathy.  Skin:    General: Skin is warm and dry.  Neurological:     Mental Status: She is alert and oriented to person, place, and time.  Psychiatric:        Behavior: Behavior normal.        Thought Content: Thought content normal.        Judgment: Judgment normal.    Clinical Support on 05/25/2020  Component Date Value Ref Range Status  . Sodium 05/25/2020 136  135 - 145 mmol/L Final  . Potassium 05/25/2020 3.9  3.5 - 5.1 mmol/L Final  . Chloride 05/25/2020 104  98 - 111 mmol/L Final  . CO2 05/25/2020 23  22 - 32 mmol/L Final  . Glucose, Bld 05/25/2020 107* 70 - 99 mg/dL Final  Glucose reference range applies only to samples taken after fasting for at least 8 hours.  . BUN 05/25/2020 9  8 - 23 mg/dL Final  . Creatinine, Ser 05/25/2020 0.59  0.44 - 1.00 mg/dL Final  . Calcium 05/25/2020 8.5* 8.9 - 10.3 mg/dL Final  . Total Protein 05/25/2020 6.9  6.5 - 8.1 g/dL Final  . Albumin 05/25/2020 3.6  3.5 - 5.0 g/dL Final  . AST 05/25/2020 26  15 - 41 U/L Final  . ALT 05/25/2020 40  0 - 44 U/L Final   . Alkaline Phosphatase 05/25/2020 42  38 - 126 U/L Final  . Total Bilirubin 05/25/2020 0.6  0.3 - 1.2 mg/dL Final  . GFR calc non Af Amer 05/25/2020 >60  >60 mL/min Final  . GFR calc Af Amer 05/25/2020 >60  >60 mL/min Final  . Anion gap 05/25/2020 9  5 - 15 Final   Performed at [facility], [address] [place], [place] [address]  . WBC 05/25/2020 8.4  4.0 - 10.5 K/uL Final  . RBC 05/25/2020 4.62  3.87 - 5.11 MIL/uL Final  . Hemoglobin 05/25/2020 13.2  12.0 - 15.0 g/dL Final  . HCT 05/25/2020 40.4  36 - 46 % Final  . MCV 05/25/2020 87.4  80.0 - 100.0 fL Final  . MCH 05/25/2020 28.6  26.0 - 34.0 pg Final  . MCHC 05/25/2020 32.7  30.0 - 36.0 g/dL Final  . RDW 05/25/2020 13.2  11.5 - 15.5 % Final  . Platelets 05/25/2020 271  150 - 400 K/uL Final  . nRBC 05/25/2020 0.0  0.0 - 0.2 % Final  . Neutrophils Relative % 05/25/2020 78  % Final  . Neutro Abs 05/25/2020 6.5  1.7 - 7.7 K/uL Final  . Lymphocytes Relative 05/25/2020 9  % Final  . Lymphs Abs 05/25/2020 0.8  0.7 - 4.0 K/uL Final  . Monocytes Relative 05/25/2020 7  % Final  . Monocytes Absolute 05/25/2020 0.6  0 - 1 K/uL Final  . Eosinophils Relative 05/25/2020 6  % Final  . Eosinophils Absolute 05/25/2020 0.5  0 - 0 K/uL Final  . Basophils Relative 05/25/2020 0  % Final  . Basophils Absolute 05/25/2020 0.0  0 - 0 K/uL Final  . Immature Granulocytes 05/25/2020 0  % Final  . Abs Immature Granulocytes 05/25/2020 0.03  0.00 - 0.07 K/uL Final   Performed at [facility], [address], [place], [place] [address]  . Magnesium 05/25/2020 2.1  1.7 - 2.4 mg/dL Final   Performed at [facility], [address] [place], [place] [address]  Hospital Outpatient Visit on 05/24/2020  Component Date Value Ref Range Status  . Glucose-Capillary 05/24/2020 96  70 - 99 mg/dL Final   Glucose reference range applies only to samples taken after fasting for at least 8 hours.    Assessment:  Elizabeth [name] is a 69 y.o. female with history of stage IA right breast cancer (2020) and stage IV right lung cancer (2021).  She presented with progressive shortness of breath and a large left pleural effusion.  Thoracentesis on 05/13/2020 revealed 1.5 L of blood-tinged pleural fluid.  Pleural fluid cytology was positive for adenocarcinoma.  There were abundant cohesive groups of large neoplastic cells are present. Immunohistochemistry (IHC) was positive for TTF1, with 75% of cells showing moderate to strong nuclear staining.  They were negative for GATA3. The IHC profile was consistent with adenocarcinoma of lung origin. The IHC result matched that  reported for the thoracic lymph node metastases ([id number], 12/22/2019).   Foundation One on 12/22/2019 revealed PDL1 100% and BRAF V600E. In addition:  microsatellite status-stable, tumor mutational burden 8 Muts/Mb, BCORL1 [id number], IDH1 R132C, MLL2 R1918C, STAG2 [id number].  There were no reportable alterations in ALK, EGFR, ERRB2, KRAS, MET, RET, ROS1.  She was initially diagnosed with stage IIIA adenocarcinoma of the right lung.  She received concurrent chemotherapy (carboplatin and Taxol x 5 weekly cycles) and a truncated course of radiation (12 fractions: 01/14/2020 - 01/28/2020).  Chemotherapy completed on 02/09/2020.  She has received consolidative immunotherapy with Imfinzi (durvalumab) from 03/03/2020 - 04/28/2020.   Chest CT angiogram on 05/12/2020 revealed a very small amount of acute pulmonary embolism within a lower lobe branch of the right pulmonary artery.  There was moderate to marked consolidation throughout the LLL with a moderate size left pleural effusion.  There were small slightly patchy focal areas of atelectasis and/or infiltrate within the anterolateral aspect of the RUL and posterior medial aspect of the RLL.  There was a 6 mm noncalcified lung nodule within the anterior aspect of the LUL.  There was stable well-defined areas of  low-attenuation within the left lobe of the liver (possible hemangiomas).  Bilateral lower extremity duplex on 05/14/2020 revealed a DVT in the right calf.  There was thrombus involving the right peroneal veins.  There was no DVT in the left lower extremity.  She is on Eliquis.  PET scan on 05/24/2020 revealed progressive thoracic lymphadenopathy, compatible with nodal metastases.  There was moderate left pleural effusion, likely malignant. There was associated left lower lobe collapse with hypermetabolism, raising the possibility of underlying infection/neoplasm.  There was additional patchy/nodular opacities in the right lung with trace right pleural effusion.  CEA has been followed: 13.1 on 05/25/2020.  She was diagnosed with right breast cancer.  She underwent lumpectomy and sentinel lymph node biopsy on 05/08/2019.  Pathologyrevealed a 1.1 cm grade IIinvasive mammary carcinoma with lobular features and adjacent DCIS.   OncotypeDXtestingrevealed a score of 10 (low risk).  She received breast radiationfrom 06/10/2019 - 07/29/2019.She begantamoxifenon 08/04/2019.  CA27.29 has been followed: 20.0 on 05/01/2019, 210.2 on 11/27/2019, and 770.4 on 05/25/2020.  Initial elevation of CA27.29 resulted in diagnosis of lung cancer.  The patient received the [place] COVID-19 vaccine on 11/12/2019 and 12/08/2019.  Symptomatically, she has become more short of breath with increasing left sided effusion.  She has left sided back pain/pressure.  Plan: 1.   Labs today: CBC with diff, CMP, Mg, CEA, CA27.29 2.   Stage IV adenocarcinoma of the right lung  Review pleural fluid cytology c/w adenocarcinoma of lung origin.  Review PET scan.  Images personally reviewed.  Agree with radiology interpretation.   She has progressive thoracic lymphadenopathy c/w nodal metastasis.   She has a moderate left pleural effusion with LLL collapse.   There are patchy opacities in the right lung with trace  effusion.  Discuss initial plans for head MRI- cancelled by patient.   Reschedule head MRI.  Discuss initial plan for palliative chemotherapy and immunotherapy.   Carboplatin, Alimta, and pembrolizumab.   Patient received J09 and began folic acid on 32/67/1245.  Discuss molecular targetted therapy (patient BRAF+)   Dabrafenib (Tafinlar) 150 mg po BID + trametinib (Mekinist) 2 mg po q day.   Side effects reviewed: cardiac, dermatologic, fever, bleeding, uveitis, pneumonitis.   Patient would require an echo at baseline, 1 month then every 2-3 months.  Consider available local clinical trials ([facility]  or [facility]). 3.   Malignant pleural effusion  Patient underwent thoracentesis of 1/5 L on 05/13/2020.   Fluid has reaccumulated.  Schedule thoracentesis.  Anticipate PleurX catheter vs pleurodesis if fluid remains problematic. 4.   Stage IA right breast cancer Clinically,she is doing well. She is s/p lumpectomy and SLN biopsy (05/08/2019). She completed radiation on 07/29/2019. She began tamoxifen on 08/04/2019. CA27.29 is 770.4.  CA27.29 appears to reflect activity of lung cancer as it's elevation prompted discovery of lung cancer. 5.   Pulmonary embolism and RLE DVT             Malignancy induced thrombosis.             Risk of thromboembolism with tamoxifen approximately 5%.             Patient currently holding Eliquis for possible port-a-cath on 05/27/2020. 6.   Cancer-related pain  Rx:  Percocet 5/325 1 tablet po q 6 hours prn pain (dis: #30). 7.   Contact Dr Dahlia Byes on phone re: port and Eliquis and pleural effusion. 8.   Contact interventional radiology (IR) re: thoracentesis. 9.   Schedule ultrasound guided thoracentesis. 10.   Reschedule head MRI. 11.   Present at tumor board on 05/26/2020. 12.   RTC tomorrow for MD brief assessment and consideration of treatment.  Addendum:   I spoke to Dr Nori Riis Ready at [facility] about the  patient.  He stated that the patient may be eligible for the PHAROS trial (encorafenib + binimetinib) for lung cancer patients with the BRAF 600 mutation. Treatment appears better tolerated than dabrafenib + trametinib.  Medications are oral.  The trial is run by Dr Varney Biles.  She could possibly be seen in clinic on Monday, 05/30/2020.  I discussed the assessment and treatment plan with the patient.  The patient was provided an opportunity to ask questions and all were answered.  The patient agreed with the plan and demonstrated an understanding of the instructions.  The patient was advised to call back if the symptoms worsen or if the condition fails to improve as anticipated.  I provided 35 minutes of face-to-face time during this this encounter and > 50% was spent counseling as documented under my assessment and plan. An additional 10-15 minutes were spent reviewing her chart (Epic and Care Everywhere) including notes, labs, imaging studies, coordinating care with other providers and contacting [facility] regarding available protocols.  Multiple attempts were made to contact medical oncologist at [facility] in [place].   Marsh Heckler C. Mike Gip, MD, PhD    05/25/2020, 8:56 AM  I, De Burrs, am acting as scribe for [employer]. Mike Gip, MD, PhD.  I, Zeyad Delaguila C. Mike Gip, MD, have reviewed the above documentation for accuracy and completeness, and I agree with the above.

## 2020-05-24 ENCOUNTER — Ambulatory Visit (HOSPITAL_COMMUNITY): Payer: Federal, State, Local not specified - PPO

## 2020-05-24 ENCOUNTER — Ambulatory Visit
Admission: RE | Admit: 2020-05-24 | Discharge: 2020-05-24 | Disposition: A | Discharge status: Home / Self Care | Payer: Federal, State, Local not specified - PPO | Source: Ambulatory Visit | Attending: Hematology and Oncology | Admitting: Hematology and Oncology

## 2020-05-24 ENCOUNTER — Other Ambulatory Visit: Payer: Self-pay

## 2020-05-24 DIAGNOSIS — C7801 Secondary malignant neoplasm of right lung: Secondary | ICD-10-CM | POA: Insufficient documentation

## 2020-05-24 DIAGNOSIS — C7802 Secondary malignant neoplasm of left lung: Secondary | ICD-10-CM | POA: Insufficient documentation

## 2020-05-24 DIAGNOSIS — C50411 Malignant neoplasm of upper-outer quadrant of right female breast: Secondary | ICD-10-CM

## 2020-05-24 DIAGNOSIS — J910 Malignant pleural effusion: Secondary | ICD-10-CM | POA: Diagnosis not present

## 2020-05-24 DIAGNOSIS — Z170 Estrogen receptor positive status [ER+]: Secondary | ICD-10-CM | POA: Diagnosis not present

## 2020-05-24 DIAGNOSIS — C3491 Malignant neoplasm of unspecified part of right bronchus or lung: Secondary | ICD-10-CM

## 2020-05-24 DIAGNOSIS — C50911 Malignant neoplasm of unspecified site of right female breast: Secondary | ICD-10-CM | POA: Diagnosis present

## 2020-05-24 LAB — GLUCOSE, CAPILLARY: Glucose-Capillary: 96 mg/dL (ref 70–99)

## 2020-05-24 MED ORDER — FLUDEOXYGLUCOSE F - 18 (FDG) INJECTION
9.3600 | Freq: Once | INTRAVENOUS | Status: AC | PRN
  Administered 2020-05-24: 9.36 via INTRAVENOUS

## 2020-05-25 ENCOUNTER — Telehealth (INDEPENDENT_AMBULATORY_CARE_PROVIDER_SITE_OTHER): Payer: Federal, State, Local not specified - PPO | Admitting: Surgery

## 2020-05-25 ENCOUNTER — Telehealth: Payer: Self-pay

## 2020-05-25 ENCOUNTER — Other Ambulatory Visit
Admission: RE | Admit: 2020-05-25 | Discharge: 2020-05-25 | Disposition: A | Discharge status: Home / Self Care | Payer: Federal, State, Local not specified - PPO | Source: Ambulatory Visit | Attending: Hematology and Oncology | Admitting: Hematology and Oncology

## 2020-05-25 ENCOUNTER — Inpatient Hospital Stay (HOSPITAL_BASED_OUTPATIENT_CLINIC_OR_DEPARTMENT_OTHER): Payer: Federal, State, Local not specified - PPO | Admitting: Hematology and Oncology

## 2020-05-25 ENCOUNTER — Inpatient Hospital Stay: Payer: Federal, State, Local not specified - PPO | Admitting: Hematology and Oncology

## 2020-05-25 ENCOUNTER — Inpatient Hospital Stay: Payer: Federal, State, Local not specified - PPO

## 2020-05-25 ENCOUNTER — Encounter: Payer: Self-pay | Admitting: Surgery

## 2020-05-25 ENCOUNTER — Encounter: Payer: Self-pay | Admitting: Hematology and Oncology

## 2020-05-25 VITALS — BP 95/60 | HR 96 | Temp 97.3°F | Resp 18 | Ht 62.0 in | Wt 169.6 lb

## 2020-05-25 DIAGNOSIS — J90 Pleural effusion, not elsewhere classified: Secondary | ICD-10-CM

## 2020-05-25 DIAGNOSIS — R978 Other abnormal tumor markers: Secondary | ICD-10-CM

## 2020-05-25 DIAGNOSIS — C3491 Malignant neoplasm of unspecified part of right bronchus or lung: Secondary | ICD-10-CM

## 2020-05-25 DIAGNOSIS — G893 Neoplasm related pain (acute) (chronic): Secondary | ICD-10-CM

## 2020-05-25 DIAGNOSIS — Z01812 Encounter for preprocedural laboratory examination: Secondary | ICD-10-CM | POA: Diagnosis not present

## 2020-05-25 DIAGNOSIS — C3431 Malignant neoplasm of lower lobe, right bronchus or lung: Secondary | ICD-10-CM | POA: Diagnosis not present

## 2020-05-25 DIAGNOSIS — Z170 Estrogen receptor positive status [ER+]: Secondary | ICD-10-CM

## 2020-05-25 DIAGNOSIS — Z20822 Contact with and (suspected) exposure to covid-19: Secondary | ICD-10-CM | POA: Diagnosis not present

## 2020-05-25 DIAGNOSIS — C50411 Malignant neoplasm of upper-outer quadrant of right female breast: Secondary | ICD-10-CM

## 2020-05-25 DIAGNOSIS — C3490 Malignant neoplasm of unspecified part of unspecified bronchus or lung: Secondary | ICD-10-CM | POA: Insufficient documentation

## 2020-05-25 DIAGNOSIS — Z7189 Other specified counseling: Secondary | ICD-10-CM

## 2020-05-25 DIAGNOSIS — M816 Localized osteoporosis [Lequesne]: Secondary | ICD-10-CM

## 2020-05-25 LAB — CBC WITH DIFFERENTIAL/PLATELET
Abs Immature Granulocytes: 0.03 10*3/uL (ref 0.00–0.07)
Basophils Absolute: 0 10*3/uL (ref 0.0–0.1)
Basophils Relative: 0 %
Eosinophils Absolute: 0.5 10*3/uL (ref 0.0–0.5)
Eosinophils Relative: 6 %
HCT: 40.4 % (ref 36.0–46.0)
Hemoglobin: 13.2 g/dL (ref 12.0–15.0)
Immature Granulocytes: 0 %
Lymphocytes Relative: 9 %
Lymphs Abs: 0.8 10*3/uL (ref 0.7–4.0)
MCH: 28.6 pg (ref 26.0–34.0)
MCHC: 32.7 g/dL (ref 30.0–36.0)
MCV: 87.4 fL (ref 80.0–100.0)
Monocytes Absolute: 0.6 10*3/uL (ref 0.1–1.0)
Monocytes Relative: 7 %
Neutro Abs: 6.5 10*3/uL (ref 1.7–7.7)
Neutrophils Relative %: 78 %
Platelets: 271 10*3/uL (ref 150–400)
RBC: 4.62 MIL/uL (ref 3.87–5.11)
RDW: 13.2 % (ref 11.5–15.5)
WBC: 8.4 10*3/uL (ref 4.0–10.5)
nRBC: 0 % (ref 0.0–0.2)

## 2020-05-25 LAB — COMPREHENSIVE METABOLIC PANEL
ALT: 40 U/L (ref 0–44)
AST: 26 U/L (ref 15–41)
Albumin: 3.6 g/dL (ref 3.5–5.0)
Alkaline Phosphatase: 42 U/L (ref 38–126)
Anion gap: 9 (ref 5–15)
BUN: 9 mg/dL (ref 8–23)
CO2: 23 mmol/L (ref 22–32)
Calcium: 8.5 mg/dL — ABNORMAL LOW (ref 8.9–10.3)
Chloride: 104 mmol/L (ref 98–111)
Creatinine, Ser: 0.59 mg/dL (ref 0.44–1.00)
GFR calc Af Amer: >60 mL/min (ref >60)
GFR calc non Af Amer: >60 mL/min (ref >60)
Glucose, Bld: 107 mg/dL — ABNORMAL HIGH (ref 70–99)
Potassium: 3.9 mmol/L (ref 3.5–5.1)
Sodium: 136 mmol/L (ref 135–145)
Total Bilirubin: 0.6 mg/dL (ref 0.3–1.2)
Total Protein: 6.9 g/dL (ref 6.5–8.1)

## 2020-05-25 LAB — MAGNESIUM: Magnesium: 2.1 mg/dL (ref 1.7–2.4)

## 2020-05-25 LAB — SARS CORONAVIRUS 2 (TAT 6-24 HRS): SARS Coronavirus 2: NEGATIVE

## 2020-05-25 MED ORDER — DEXAMETHASONE 4 MG PO TABS: ORAL_TABLET | ORAL | 1 refills | Status: AC

## 2020-05-25 MED ORDER — LIDOCAINE-PRILOCAINE 2.5-2.5 % EX CREA: TOPICAL_CREAM | CUTANEOUS | 3 refills | Status: AC

## 2020-05-25 MED ORDER — OXYCODONE-ACETAMINOPHEN 5-325 MG PO TABS: 1.0000 | ORAL_TABLET | Freq: Four times a day (QID) | ORAL | 0 refills | Status: AC | PRN

## 2020-05-25 NOTE — Progress Notes (Signed)
Patient c/o mid left sided back pain (3).

## 2020-05-25 NOTE — Progress Notes (Signed)
Virtual Visit via Telemedicine Note  I connected with Elizabeth [name] by telephone her home on 05/25/20 at  1:30 PM EDT and verified that I was speaking with the correct person using their name and two idenfiers/date of birth.   I discussed the limitations, risks, security and privacy concerns of performing an evaluation and management service by telephonic/video telemedicine and the availability of in person appointments. I also discussed with the patient that there may be a patient responsible charge related to this service. The patient expressed understanding and agreed to proceed.  History of Present Illness: 69 y.o. 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  She is in need for port placement for further chemotherapy.  Of note I did a lumpectomy and sentinel lymph node biopsy on the right side for invasive mammary carcinoma ER/PR positive. Subsequently she developed shortness of breath work-up revealed evidence of mediastinal lymphadenopathy consistent with metastatic adenocarcinoma of the lung.  She is scheduled to have a repeat thoracentesis tomorrow.  She also was found to have a PE.  Please note that I have personally reviewed the CT scan showing the pleural effusion and the mediastinal adenopathy. He does have dyspnea on exertion related to her malignant pleural effusion on the left side.  She denies any fevers any chills.  I had a lengthy discussion with her regarding the rationale for port placement and she is in agreement to proceed.  She will hold off Eliquis for 48 hours before surgery     Assessment:  69 y.o. yo Female with a problem list including...  Patient Active Problem List   Diagnosis Date Noted   Stage IV adenocarcinoma of lung ([place]) 05/25/2020   Pleural effusion, malignant 05/18/2020   Acute deep vein thrombosis (DVT) of distal end of right lower extremity ([place]) 05/18/2020   Pleural effusion on left     Pulmonary embolism ([facility])    Respiratory failure with hypoxia ([place]) 05/12/2020   DOE (dyspnea on exertion) 04/18/2020   Encounter for antineoplastic immunotherapy 03/31/2020   Encounter for antineoplastic chemotherapy 01/12/2020   Goals of care, counseling/discussion 01/12/2020   Adenocarcinoma of right lung, stage 3 ([place]) 12/31/2019   Mediastinal adenopathy 12/09/2019   Right lower lobe pulmonary nodule 12/09/2019   Elevated tumor markers 12/01/2019   Localized osteoporosis without current pathological fracture 05/04/2019   Malignant neoplasm of upper-outer quadrant of right female breast ([place]) 05/01/2019   Age-related osteoporosis without current pathological fracture 05/01/2019   Diverticulosis 12/05/2018   GERD (gastroesophageal reflux disease) 01/30/2017   Insomnia 01/30/2017   Anxiety 09/07/2015   Elevated WBC count 09/07/2015   H/O total hip arthroplasty 09/07/2015   HLD (hyperlipidemia) 09/07/2015   Headache, migraine 09/07/2015   Osteoarthritis 09/07/2015   Arthropathy of temporomandibular joint 09/07/2015   Olecranon bursitis 09/07/2015   Major depressive disorder, single episode 09/07/2015   Restless leg 07/08/2015   Primary osteoarthritis of right hip 01/19/2015   DJD (degenerative joint disease) 01/18/2015   Left shoulder pain 10/05/2013     Metastatic adenocarcinoma of the lung with malignant pleural effusion for port placement.  Procedure discussed with patient in detail.  Risks, benefits and possible applications including but not limited to: Bleeding, infection injury to adjacent structures including pneumothorax or vascular injuries were discussed with her in detail.  She understands and wished to proceed.  She also has an appointment at [facility] for further potential clinical trial. We will go ahead and add her on the schedule for  this Friday for port placement.  Is not that I spent over 25 minutes in this encounter with greater  than 50% spent in coordination counseling of her care.  Case discussed in detail with Dr. Wyvonnia Lora, MD, [place]: [place] [facility] exceptional care. Office: [phone]

## 2020-05-25 NOTE — Patient Instructions (Signed)
Other options:  dabrafenib + trametinib    Trametinib oral tablets What is this medicine? TRAMETINIB (tra me ti nib) is a medicine that targets proteins in cancer cells and stops the cancer cells from growing. It is used to treat melanoma, non-small cell lung cancer, and thyroid cancer. This medicine may be used for other purposes; ask your health care provider or pharmacist if you have questions. COMMON BRAND NAME(S): Mekinist What should I tell my health care provider before I take this medicine? They need to know if you have any of these conditions:  diabetes  eye disease, vision problems  heart disease  high blood pressure  history of bleeding problems  history of blood clots  inflammatory bowel disease  kidney disease  liver disease  lung or breathing disease, like asthma  stomach or intestine problems  an unusual or allergic reaction to trametinib, other medicines, foods, dyes, or preservatives  pregnant or trying to get pregnant  breast-feeding How should I use this medicine? Take this medicine by mouth with a glass of water. Follow the directions on the prescription label. Take this medicine on an empty stomach, at least 1 hour before or 2 hours after food. Do not take with food. Take your medicine at regular intervals. Do not take it more often than directed. Do not stop taking except on your doctor's advice. A special MedGuide will be given to you by the pharmacist with each prescription and refill. Be sure to read this information carefully each time. Talk to your pediatrician regarding the use of this medicine in children. Special care may be needed. Overdosage: If you think you have taken too much of this medicine contact a poison control center or emergency room at once. NOTE: This medicine is only for you. Do not share this medicine with others. What if I miss a dose? If you miss a dose, take it as soon as you can. If your next dose is to be taken in  less than 12 hours, then do not take the missed dose. Take the next dose at your regular time. Do not take double or extra doses. What may interact with this medicine? Drug interactions have not been identified. Give your health care provider a list of all the medicines, herbs, non-prescription drugs, or dietary supplements you use. Also tell them if you smoke, drink alcohol, or use illegal drugs. Some items may interact with your medicine. This list may not describe all possible interactions. Give your health care provider a list of all the medicines, herbs, non-prescription drugs, or dietary supplements you use. Also tell them if you smoke, drink alcohol, or use illegal drugs. Some items may interact with your medicine. What should I watch for while using this medicine? Visit your doctor or health care professional for regular checks on your progress. Your vision may be tested before and during use of this medicine. Tell your doctor or health care professional right away if you have any change in your eyesight. Do not become pregnant while taking this medicine or for 4 months after stopping it. Women should inform their doctor if they wish to become pregnant or think they might be pregnant. Do not breast-feed an infant while taking this medicine or for 4 months after stopping it. Males who get this medicine must use a condom during sex with females who can get pregnant. If you get a women pregnant, the baby could have birth defects. The baby could die before they are born. You will  need to continue wearing a condom for at least 4 months after stopping the medicine. Tell your healthcare provider right away if your partner becomes pregnant while you are taking this medicine. This medicine may make it more difficult to get pregnant. Talk to your healthcare professional if you are concerned about your fertility. What side effects may I notice from receiving this medicine? Side effects that you should  report to your doctor or health care professional as soon as possible:  allergic reactions like skin rash, itching or hives, swelling of the face, lips, or tongue  blurred vision  cough  diarrhea  eye pain  feeling faint or lightheaded  rash, fever, and swollen lymph nodes  redness, blistering, peeling, or loosening of the skin, including inside the mouth  severe headaches  signs and symptoms of bleeding such as bloody or black, tarry stools; red or dark-brown urine; spitting up blood or brown material that looks like coffee grounds; red spots on the skin; unusual bruising or bleeding from the eye, gums, or nose  signs and symptoms of a blood clot such as breathing problems; changes in vision; chest pain; severe, sudden headache; pain, swelling, warmth in the leg; trouble speaking; sudden numbness or weakness of the face, arm or leg  signs and symptoms of heart failure like breathing problems; fast, irregular heartbeat; sudden weight gain; swelling of the ankles, feet, hands; unusually weak or tired  signs and symptoms of high blood sugar such as dizziness; dry mouth; dry skin; fruity breath; nausea; stomach pain; increased hunger or thirst; increased urination  stomach pain  unusually weak or tired Side effects that usually do not require medical attention (report to your doctor or health care professional if they continue or are bothersome):  decreased appetite  joint pain  muscle pain  nausea, vomiting This list may not describe all possible side effects. Call your doctor for medical advice about side effects. You may report side effects to FDA at 1-800-FDA-1088. Where should I keep my medicine? Store between 2 and 8 degrees C (36 and 46 degrees F). Do not freeze. Keep this medicine in the original container. Throw away any unused medicine after the expiration date. Keep out of the reach of children. NOTE: This sheet is a summary. It may not cover all possible  information. If you have questions about this medicine, talk to your doctor, pharmacist, or health care provider.  2020 Elsevier/Gold Standard (2019-02-05 12:39:13)   Dabrafenib oral capsules What is this medicine? DABRAFENIB (da braf e nib) is a medicine that targets proteins in cancer cells and stops the cancer cells from growing. It is used to treat melanoma, non-small cell lung cancer, and thyroid cancer. This medicine may be used for other purposes; ask your health care provider or pharmacist if you have questions. COMMON BRAND NAME(S): Tafinlar What should I tell my health care provider before I take this medicine? They need to know if you have any of these conditions:  diabetes  eye disease, vision problems  G6PD deficiency  having surgery  history of bleeding problems  kidney disease  liver disease  an unusual or allergic reaction to dabrafenib, other medicines, foods, dyes, or preservatives  pregnant or trying to get pregnant  breast-feeding How should I use this medicine? Take this medicine by mouth with a glass of water. Follow the directions on the prescription label. Take this medicine on an empty stomach, at least 1 hour before or 2 hours after food. Do not take with  food. Do not take with grapefruit juice. Do not cut, crush, or chew this medicine. Avoid taking H2 blockers or antacids within 2 hours of taking this medicine. Take your medicine at regular intervals. Do not take it more often than directed. Do not stop taking except on your doctor's advice. A special MedGuide will be given to you by the pharmacist with each prescription and refill. Be sure to read this information carefully each time. Talk to your pediatrician regarding the use of this medicine in children. Special care may be needed. Overdosage: If you think you have taken too much of this medicine contact a poison control center or emergency room at once. NOTE: This medicine is only for you. Do not  share this medicine with others. What if I miss a dose? If you miss a dose, take it as soon as you can. If your next dose is to be taken in less than 6 hours, then do not take the missed dose. Take the next dose at your regular time. Do not take double or extra doses. What may interact with this medicine? This medicine may interact with the following medications:  clarithromycin  dexamethasone  female hormones, like estrogens or progestins and birth control pills, patches, rings, or injections  gemfibrozil  grapefruit juice  ketoconazole  midazolam  nefazodone  warfarin This list may not describe all possible interactions. Give your health care provider a list of all the medicines, herbs, non-prescription drugs, or dietary supplements you use. Also tell them if you smoke, drink alcohol, or use illegal drugs. Some items may interact with your medicine. What should I watch for while using this medicine? Visit your doctor or health care professional for regular checks on your progress. Talk to your doctor about your risk of cancer. You may be more at risk for certain types of cancers if you take this medicine. Tell your doctor or health care professional right away if you have any change in your eyesight. Do not become pregnant while taking this medicine or for at least 2 weeks after stopping it. Women should inform their doctor if they wish to become pregnant or think they might be pregnant. There is a potential for serious side effects to an unborn child. Talk to your health care professional or pharmacist for more information. Do not breast-feed an infant while taking this medicine or for 2 weeks after stopping it. Males who get this medicine must use a condom during sex with females who can get pregnant. If you get a women pregnant, the baby could have birth defects. The baby could die before they are born. You will need to continue wearing a condom for at least 2 weeks after stopping  the medicine. Tell your healthcare provider right away if your partner becomes pregnant while you are taking this medicine. This medicine may interfere with the ability to have a child. You should talk with your doctor or health care professional if you are concerned about your fertility. What side effects may I notice from receiving this medicine? Side effects that you should report to your doctor or health care professional as soon as possible:  allergic reactions like itching or hives, swelling of the face, lips, or tongue  changes in vision  dizziness  eye pain  rash, fever, and swollen lymph nodes  redness, blistering, peeling, or loosening of the skin, including inside the mouth  severe headaches  signs and symptoms of bleeding such as bloody or black, tarry stools; red or  dark brown urine; spitting up blood or brown material that looks like coffee grounds; red spots on the skin; unusual bruising or bleeding from the eyes, gums, or nose  signs and symptoms of heart failure like breathing problems; fast, irregular heartbeat; sudden weight gain; swelling of the ankles, feet, hands; unusually weak or tired  signs and symptoms of high blood sugar such as being more thirsty or hungry or having to urinate more than normal. You may also feel very tired or have blurry vision  yellowing of the skin Side effects that usually do not require medical attention (report to your doctor or health care professional if they continue or are bothersome):  diarrhea  hair loss  joint pain  muscle pain  vomiting This list may not describe all possible side effects. Call your doctor for medical advice about side effects. You may report side effects to FDA at 1-800-FDA-1088. Where should I keep my medicine? Store between 20 and 25 degrees C (68 and 77 degrees F). Your healthcare provider or pharmacist should throw away any unused medicine after the expiration date. Keep out of the reach of  children. NOTE: This sheet is a summary. It may not cover all possible information. If you have questions about this medicine, talk to your doctor, pharmacist, or health care provider.  2020 Elsevier/Gold Standard (2019-01-23 13:23:04)

## 2020-05-25 NOTE — Telephone Encounter (Signed)
Spoke with this patient to inform her that she will not get treatment tomorrow, She is aware and I have also inform Ms Shirlean Mylar to get the patient schedule.

## 2020-05-25 NOTE — Discharge Instructions (Signed)
Thoracentesis, Care After This sheet gives you information about how to care for yourself after your procedure. Your health care provider may also give you more specific instructions. If you have problems or questions, contact your health care provider. What can I expect after the procedure? After your procedure, it is common to have some pain at the site where the needle was inserted (puncture site). Follow these instructions at home:  Care of the puncture site  Follow instructions from your health care provider about how to take care of your puncture site. Make sure you: ? Wash your hands with soap and water before you change your bandage (dressing). If soap and water are not available, use hand sanitizer. ? Change your dressing as told by your health care provider.  Check the puncture site every day for signs of infection. Check for: ? Redness, swelling, or pain. ? Fluid or blood. ? Warmth. ? Pus or a bad smell.  Do not take baths, swim, or use a hot tub until your health care provider approves. General instructions  Take over-the-counter and prescription medicines only as told by your health care provider.  Do not drive for 24 hours if you were given a medicine to help you relax (sedative) during your procedure.  Drink enough fluid to keep your urine pale yellow.  You may return to your normal diet and normal activities as told by your health care provider.  Keep all follow-up visits as told by your health care provider. This is important. Contact a health care provider if you:  Have redness, swelling, or pain at your puncture site.  Have fluid or blood coming from your puncture site.  Notice that your puncture site feels warm to the touch.  Have pus or a bad smell coming from your puncture site.  Have a fever.  Have chills.  Have nausea or vomiting.  Have trouble breathing.  Develop a worsening cough. Get help right away if you:  Have extreme shortness of  breath.  Develop chest pain.  Faint or feel light-headed. Summary  After your procedure, it is common to have some pain at the site where the needle was inserted (puncture site).  Wash your hands with soap and water before you change your bandage (dressing).  Check your puncture site every day for signs of infection.  Take over-the-counter and prescription medicines only as told by your health care provider. This information is not intended to replace advice given to you by your health care provider. Make sure you discuss any questions you have with your health care provider. Document Revised: 09/06/2017 Document Reviewed: 08/19/2017 Elsevier Patient Education  2020 [employer].

## 2020-05-25 NOTE — Telephone Encounter (Signed)
orders for Thoracentesis has been sent to scheduling for appointment. The patient has been contacted today to inform her that she will need to get a COVID rapid test done today before 12, She has been schedule for Thoracentesis 05/26/2020 at 2:00 pm for a 2:30 appointment. Orders has been placed for a port placement with Dr Dahlia Byes office.

## 2020-05-25 NOTE — Progress Notes (Signed)
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: Durvalumab 1,500 mg q28 Days x up to 12 Months TREATMENT RESPONSE: Unable to Evaluate  START ON PATHWAY REGIMEN - Non-Small Cell Lung     A cycle is every 21 days:     Pembrolizumab      Pemetrexed      Carboplatin   **Always confirm dose/schedule in your pharmacy ordering system**  Patient Characteristics: Stage IV Metastatic, Nonsquamous, Initial Chemotherapy/Immunotherapy, PS = 0, 1, ALK Rearrangement Negative and ROS1 Rearrangement Negative and NTRK Gene Fusion?Negative and RET Gene Fusion?Negative and EGFR Mutation Negative, PD-L1 Expression Positive  1-49% (TPS) / Negative / Not Tested / Awaiting Test Results and Immunotherapy Candidate Therapeutic Status: Stage IV Metastatic Histology: Nonsquamous Cell ROS1 Rearrangement Status: Negative Other Mutations/Biomarkers: No Other Actionable Mutations Chemotherapy/Immunotherapy LOT: Initial Chemotherapy/Immunotherapy Molecular Targeted Therapy: Not Appropriate KRAS G12C Mutation Status: Negative MET Exon 14 Mutation Status: Negative RET Gene Fusion Status: Negative EGFR Mutation Status: Negative/Wild Type NTRK Gene Fusion Status: Negative PD-L1 Expression Status: PD-L1 Positive 1-49% (TPS) ALK Rearrangement Status: Negative BRAF V600E Mutation Status: Positive ECOG Performance Status: 1 Biomarker Assessment Status Confirmation: All Genomic Markers Negative, or Only MET+ or BRAF+ or KRAS G12C+ Immunotherapy Candidate Status: Candidate for Immunotherapy Intent of Therapy: Non-Curative / Palliative Intent, Discussed with Patient

## 2020-05-26 ENCOUNTER — Other Ambulatory Visit: Payer: Self-pay | Admitting: Interventional Radiology

## 2020-05-26 ENCOUNTER — Other Ambulatory Visit: Payer: Federal, State, Local not specified - PPO

## 2020-05-26 ENCOUNTER — Encounter: Payer: Self-pay | Admitting: Hematology and Oncology

## 2020-05-26 ENCOUNTER — Telehealth: Payer: Self-pay | Admitting: Hematology and Oncology

## 2020-05-26 ENCOUNTER — Ambulatory Visit: Payer: Federal, State, Local not specified - PPO

## 2020-05-26 ENCOUNTER — Ambulatory Visit
Admission: RE | Admit: 2020-05-26 | Discharge: 2020-05-26 | Disposition: A | Discharge status: Home / Self Care | Payer: Federal, State, Local not specified - PPO | Source: Ambulatory Visit | Attending: Hematology and Oncology | Admitting: Hematology and Oncology

## 2020-05-26 ENCOUNTER — Other Ambulatory Visit: Payer: Self-pay

## 2020-05-26 ENCOUNTER — Telehealth: Payer: Self-pay | Admitting: Surgery

## 2020-05-26 ENCOUNTER — Ambulatory Visit
Admission: RE | Admit: 2020-05-26 | Discharge: 2020-05-26 | Disposition: A | Discharge status: Home / Self Care | Payer: Federal, State, Local not specified - PPO | Source: Ambulatory Visit | Attending: Interventional Radiology | Admitting: Interventional Radiology

## 2020-05-26 ENCOUNTER — Encounter
Admission: RE | Admit: 2020-05-26 | Discharge: 2020-05-26 | Disposition: A | Discharge status: Home / Self Care | Payer: Federal, State, Local not specified - PPO | Source: Ambulatory Visit | Attending: Surgery | Admitting: Surgery

## 2020-05-26 ENCOUNTER — Ambulatory Visit: Payer: Federal, State, Local not specified - PPO | Admitting: Internal Medicine

## 2020-05-26 DIAGNOSIS — C3491 Malignant neoplasm of unspecified part of right bronchus or lung: Secondary | ICD-10-CM

## 2020-05-26 DIAGNOSIS — J90 Pleural effusion, not elsewhere classified: Secondary | ICD-10-CM

## 2020-05-26 DIAGNOSIS — J910 Malignant pleural effusion: Secondary | ICD-10-CM | POA: Diagnosis not present

## 2020-05-26 DIAGNOSIS — Z9889 Other specified postprocedural states: Secondary | ICD-10-CM

## 2020-05-26 LAB — CEA: CEA: 13.1 ng/mL — ABNORMAL HIGH (ref 0.0–4.7)

## 2020-05-26 LAB — CANCER ANTIGEN 27.29: CA 27.29: 770.4 U/mL — ABNORMAL HIGH (ref 0.0–38.6)

## 2020-05-26 NOTE — Progress Notes (Addendum)
Tumor Board Documentation  Crystalynn Senteno was presented by Dr Mike Gip at our Tumor Board on 05/26/2020, which included representatives from medical oncology, radiation oncology, surgical oncology, internal medicine, navigation, pathology, radiology, surgical, pharmacy, research, palliative care, pulmonology.  Ashima currently presents as a current patient, for new positive pathology, for [place] with history of the following treatments: active survellience, surgical intervention(s).  Additionally, we reviewed previous medical and familial history, history of present illness, and recent lab results along with all available histopathologic and imaging studies. The tumor board considered available treatment options and made the following recommendations: Chemotherapy, Immunotherapy Targetted BRAF therapy; Clinical trial at [facility]    The following procedures/referrals were also placed: No orders of the defined types were placed in this encounter.   Clinical Trial Status: potentially eligible (Referred to [facility] for Clinical Trial)   Staging used: AJCC Stage Group  AJCC Staging:       Group: Stage IV A Adenocarcinoma of Lung   National site-specific guidelines NCCN were discussed with respect to the case.  Tumor board is a meeting of clinicians from various specialty areas who evaluate and discuss patients for whom a multidisciplinary approach is being considered. Final determinations in the plan of care are those of the provider(s). The responsibility for follow up of recommendations given during tumor board is that of the provider.   Today's extended care, comprehensive team conference, [place] was not present for the discussion and was not examined.   Multidisciplinary Tumor Board is a multidisciplinary case peer review process.  Decisions discussed in the Multidisciplinary Tumor Board reflect the opinions of the specialists present at the conference without having examined the  patient.  Ultimately, treatment and diagnostic decisions rest with the primary provider(s) and the patient.

## 2020-05-26 NOTE — Telephone Encounter (Signed)
Pt has been advised of Pre-Admission date/time, COVID Testing date and Surgery date.  Surgery Date: 05/27/20 Preadmission Testing Date: 05/26/20 (phone 8a-1p) Covid Testing Date: 05/25/20 - patient advised to go to the [facility] ([place] between 8a-1p   Patient has been made aware to call [phone], between 1-3:00pm the day before surgery, to find out what time to arrive for surgery.

## 2020-05-26 NOTE — Telephone Encounter (Signed)
Re:  Follow-up  I spoke to the patient twice today regarding her thoughts about therapy.  We discussed tumor board recommendations including targeting BRAF as discussed in clinic.     I spoke to Dr Nori Riis Ready at [facility] about the patient.  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 Treatment appears better tolerated than dabrafenib + trametinib (discussed in clinic).  Medications are oral.  The trial is run by Dr Varney Biles.  She could possibly be seen in clinic on Monday, 05/30/2020.  The patient was unsure of the direction of therapy.  Her son is flying her out to [place].  We discussed postponing her port-a-cath given her upcoming travels and likely oral treatment.  If necessary, a port can be placed in the future and peripheral IVs used during the interim.  She will restart her Eliquis.  She stated that she would contact the clinic tomorrow regarding her decision about seeing the physicians at [facility].     Elizabeth Asal, MD

## 2020-05-26 NOTE — Patient Instructions (Addendum)
Your procedure is scheduled on: 05/27/20 Report to [place]. To find out your arrival time please call [phone] between 1PM - 3PM on 05/26/20.  Remember: Instructions that are not followed completely may result in serious medical risk, up to and including death, or upon the discretion of your surgeon and anesthesiologist your surgery may need to be rescheduled.     _X__ 1. Do not eat food after midnight the night before your procedure.                 No gum chewing or hard candies. You may drink clear liquids up to 2 hours                 before you are scheduled to arrive for your surgery- DO not drink clear                 liquids within 2 hours of the start of your surgery.                 Clear Liquids include:  water, apple juice without pulp, clear carbohydrate                 drink such as Clearfast or Gatorade, Black Coffee or Tea (Do not add                 anything to coffee or tea). Diabetics water only  __X__2.  On the morning of surgery brush your teeth with toothpaste and water, you                 may rinse your mouth with mouthwash if you wish.  Do not swallow any              toothpaste of mouthwash.     _X__ 3.  No Alcohol for 24 hours before or after surgery.   _X__ 4.  Do Not Smoke or use e-cigarettes For 24 Hours Prior to Your Surgery.                 Do not use any chewable tobacco products for at least 6 hours prior to                 surgery.  ____  5.  Bring all medications with you on the day of surgery if instructed.   __X__  6.  Notify your doctor if there is any change in your medical condition      (cold, fever, infections).     Do not wear jewelry, make-up, hairpins, clips or nail polish. Do not wear lotions, powders, or perfumes.  Do not shave 48 hours prior to surgery. Men may shave face and neck. Do not bring valuables to the hospital.    [facility] is not responsible for any belongings or  valuables.  Contacts, dentures/partials or body piercings may not be worn into surgery. Bring a case for your contacts, glasses or hearing aids, a denture cup will be supplied. Leave your suitcase in the car. After surgery it may be brought to your room. For patients admitted to the hospital, discharge time is determined by your treatment team.   Patients discharged the day of surgery will not be allowed to drive home.   Please read over the following fact sheets that you were given:   MRSA Information  __X__ Take these medicines the morning of surgery with A SIP OF WATER:  1. pantoprazole (PROTONIX) 40 MG tablet  2. tamoxifen (NOLVADEX) 20 MG tablet  3. oxyCODONE-acetaminophen (PERCOCET/ROXICET) 5-325 MG tablet IF NEEDED  4. valACYclovir (VALTREX) 500 MG tablet IF NEEDED  5.  6.  ____ Fleet Enema (as directed)   __ __ Use CHG Soap/SAGE wipes as directed  __X__ Use inhalers on the day of surgery  ____ Stop metformin/Janumet/Farxiga 2 days prior to surgery    ____ Take 1/2 of usual insulin dose the night before surgery. No insulin the morning          of surgery.   __X__ Stop Blood Thinners Coumadin/Plavix/Xarelto/Pleta/Pradaxa/Eliquis/Effient/Aspirin 48 HOURS BEFORE PROCEDURE on   Or contact your Surgeon, Cardiologist or Medical Doctor regarding  ability to stop your blood thinners     __X__ Stop Anti-inflammatories 7 days before surgery such as Advil, Ibuprofen, Motrin,  BC or Goodies Powder, Naprosyn, Naproxen, Aleve, Aspirin    __X__ Stop all herbal supplements, fish oil or vitamin E until after surgery.    ____ Bring C-Pap to the hospital.    SHOWER WITH DIAL SOAP IF AVAILABLE THE NIGHT BEFORE AND THE MORNING OF SURGERY. YOU WILL BE GIVEN CHG ANTIBACTERIAL  WIPES ON ARRIVAL

## 2020-05-26 NOTE — Procedures (Signed)
  Procedure: [place] right thoracentesis   EBL:   minimal Complications:  none immediate  See full dictation in [employer].  Dillard Cannon MD Main # [phone] Pager  [phone]

## 2020-05-27 ENCOUNTER — Ambulatory Visit: Payer: Federal, State, Local not specified - PPO | Admitting: Internal Medicine

## 2020-05-27 ENCOUNTER — Encounter: Admission: RE | Payer: Self-pay | Source: Home / Self Care

## 2020-05-27 ENCOUNTER — Encounter: Payer: Self-pay | Admitting: Registered Nurse

## 2020-05-27 ENCOUNTER — Ambulatory Visit
Admission: RE | Admit: 2020-05-27 | Payer: Federal, State, Local not specified - PPO | Source: Home / Self Care | Admitting: Surgery

## 2020-05-27 IMAGING — MG BREAST SURGICAL SPECIMEN
1 series · 1 of 1 positions shown · non-contrast
Comparison: Previous exam(s).

CLINICAL DATA: Specimen radiograph from right breast lumpectomy.

EXAM:
SPECIMEN RADIOGRAPH OF THE RIGHT BREAST

[R SPECIMEN]
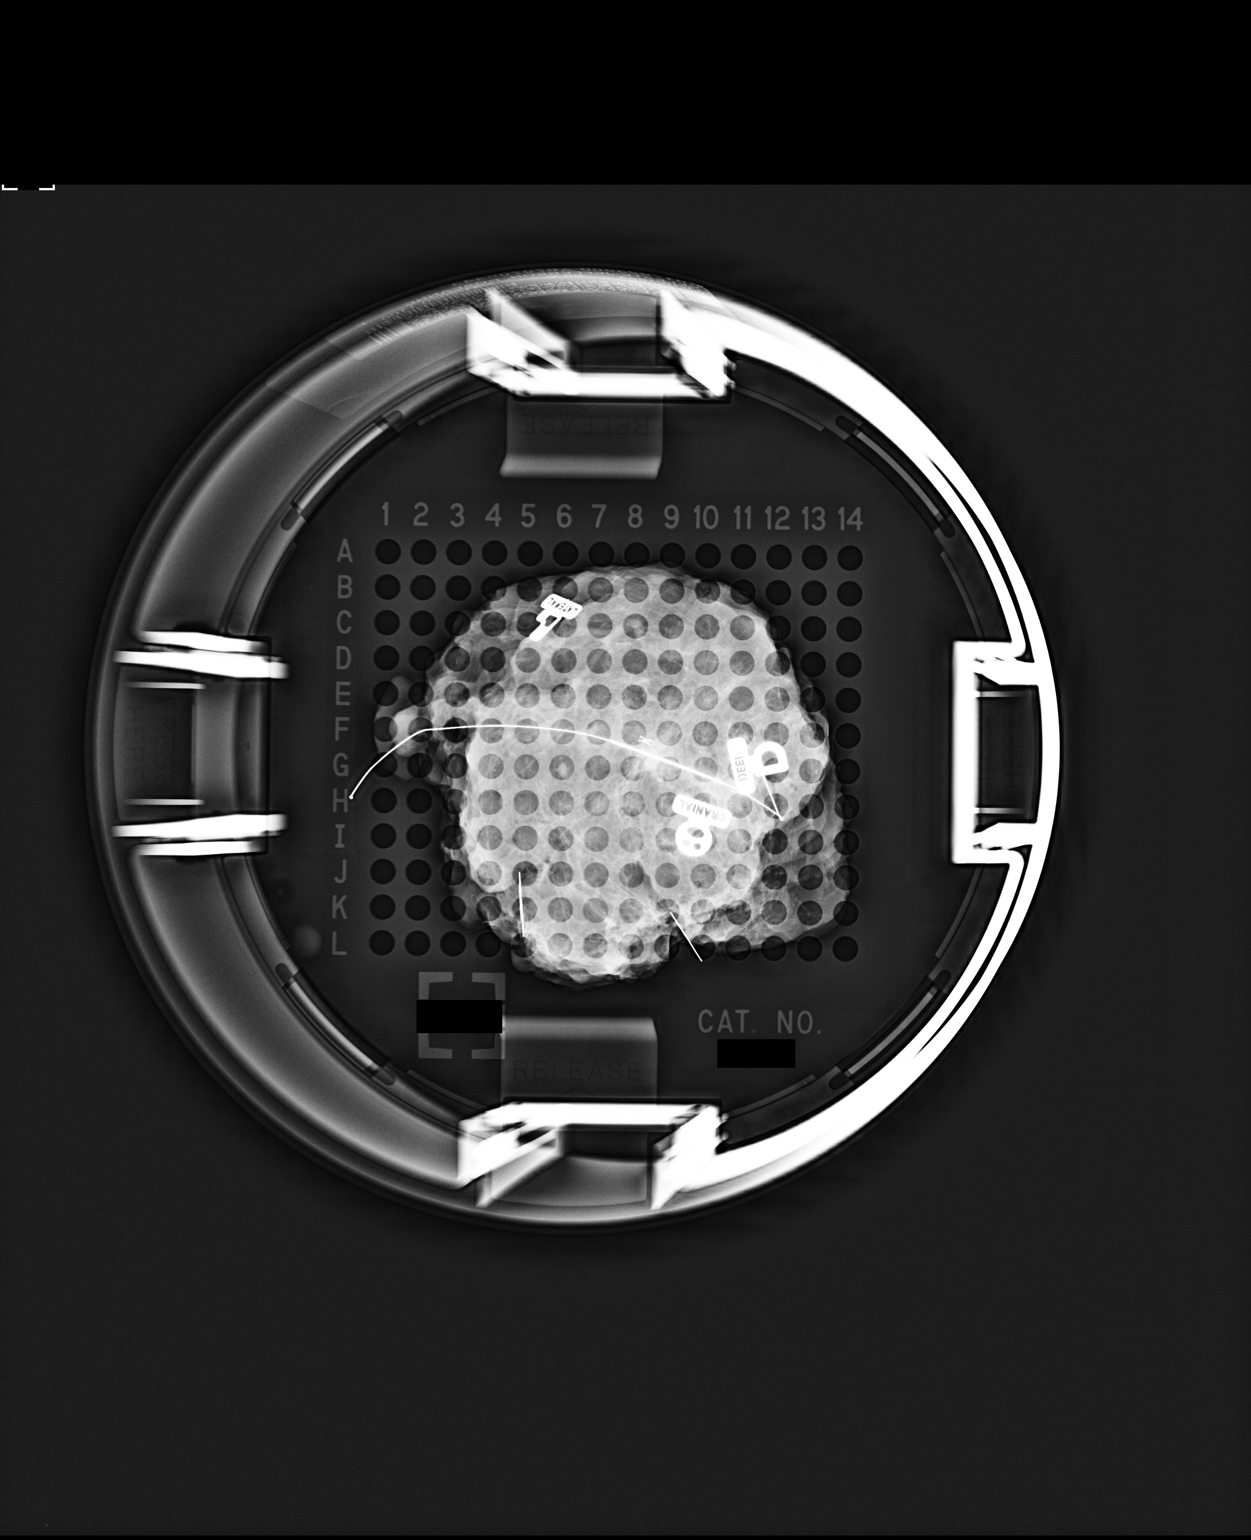

[1 of 1 positions shown; findings below may reference images not displayed]

FINDINGS: Status post excision of the right breast. The wire tip and biopsy
marker clip are present.

Results were called to Dr. Arxiles.
IMPRESSION: Specimen radiograph of the right breast.

## 2020-05-27 SURGERY — INSERTION, TUNNELED CENTRAL VENOUS DEVICE, WITH PORT
Anesthesia: General

## 2020-05-28 ENCOUNTER — Encounter: Payer: Self-pay | Admitting: Hematology and Oncology

## 2020-05-28 DIAGNOSIS — G893 Neoplasm related pain (acute) (chronic): Secondary | ICD-10-CM | POA: Insufficient documentation

## 2020-05-30 ENCOUNTER — Ambulatory Visit: Admission: RE | Admit: 2020-05-30 | Payer: Federal, State, Local not specified - PPO | Source: Ambulatory Visit

## 2020-05-30 ENCOUNTER — Ambulatory Visit: Payer: Federal, State, Local not specified - PPO | Admitting: Hematology and Oncology

## 2020-05-30 ENCOUNTER — Inpatient Hospital Stay: Payer: Federal, State, Local not specified - PPO

## 2020-06-07 ENCOUNTER — Other Ambulatory Visit: Payer: Federal, State, Local not specified - PPO

## 2020-06-08 ENCOUNTER — Other Ambulatory Visit: Payer: Federal, State, Local not specified - PPO

## 2020-06-15 LAB — FUNGUS CULTURE WITH STAIN

## 2020-06-15 LAB — FUNGUS CULTURE RESULT

## 2020-06-15 LAB — FUNGAL ORGANISM REFLEX

## 2020-06-16 ENCOUNTER — Ambulatory Visit: Payer: Federal, State, Local not specified - PPO | Admitting: Cardiology

## 2020-06-23 ENCOUNTER — Ambulatory Visit: Payer: Federal, State, Local not specified - PPO

## 2020-06-23 ENCOUNTER — Ambulatory Visit: Payer: Federal, State, Local not specified - PPO | Admitting: Internal Medicine

## 2020-06-23 ENCOUNTER — Other Ambulatory Visit: Payer: Federal, State, Local not specified - PPO

## 2020-06-27 LAB — ACID FAST CULTURE WITH REFLEXED SENSITIVITIES (MYCOBACTERIA): Acid Fast Culture: NEGATIVE

## 2020-08-15 ENCOUNTER — Other Ambulatory Visit: Payer: Self-pay | Admitting: Family Medicine

## 2020-08-15 DIAGNOSIS — F419 Anxiety disorder, unspecified: Secondary | ICD-10-CM

## 2020-08-25 ENCOUNTER — Other Ambulatory Visit: Payer: Self-pay | Admitting: Hematology and Oncology

## 2020-10-18 ENCOUNTER — Other Ambulatory Visit: Payer: Self-pay | Admitting: Family Medicine

## 2020-10-18 NOTE — Telephone Encounter (Signed)
Requested medication (s) are due for refill today: yes  Requested medication (s) are on the active medication list: yes  Last refill: 05/22/20  #60  2 refills  Future visit scheduled: No  Notes to clinic:  last filled by historic provider ( hospital provider) Please review    Requested Prescriptions  Pending Prescriptions Disp Refills   ELIQUIS 5 MG TABS tablet [Pharmacy Med Name: ELIQUIS 5 MG TAB] 60 tablet 2    Sig: TAKE 1 TABLET BY MOUTH TWICE A DAY      Hematology:  Anticoagulants Passed - 10/18/2020 12:25 PM      Passed - HGB in normal range and within 360 days    Hemoglobin  Date Value Ref Range Status  05/25/2020 13.2 12.0 - 15.0 g/dL Final  04/28/2020 12.5 12.0 - 15.0 g/dL Final  12/20/2015 13.6 11.1 - 15.9 g/dL Final          Passed - PLT in normal range and within 360 days    Platelets  Date Value Ref Range Status  05/25/2020 271 150 - 400 K/uL Final  12/20/2015 282 150 - 379 x10E3/uL Final   Platelet Count  Date Value Ref Range Status  04/28/2020 155 150 - 400 K/uL Final          Passed - HCT in normal range and within 360 days    HCT  Date Value Ref Range Status  05/25/2020 40.4 36.0 - 46.0 % Final   Hematocrit  Date Value Ref Range Status  12/20/2015 41.6 34.0 - 46.6 % Final          Passed - Cr in normal range and within 360 days    Creatinine  Date Value Ref Range Status  04/28/2020 0.72 0.44 - 1.00 mg/dL Final   Creat  Date Value Ref Range Status  09/10/2017 0.70 0.50 - 0.99 mg/dL Final    Comment:    For patients >21 years of age, the reference limit for Creatinine is approximately 13% higher for people identified as African-American. .    Creatinine, Ser  Date Value Ref Range Status  05/25/2020 0.59 0.44 - 1.00 mg/dL Final          Passed - Valid encounter within last 12 months    Recent Outpatient Visits           5 months ago Thoracic aortic atherosclerosis [facility])   [facility] Birdie Sons, [facility]   6  months ago Urinary frequency   [facility] Birdie Sons, [facility]   1 year ago Annual physical exam   [facility] Birdie Sons, [facility]   1 year ago Lower abdominal pain   [facility] Birdie Sons, MD   1 year ago Sinusitis, unspecified chronicity, unspecified location   [facility] Birdie Sons, MD

## 2020-12-24 ENCOUNTER — Other Ambulatory Visit: Payer: Self-pay | Admitting: Family Medicine

## 2020-12-24 DIAGNOSIS — F419 Anxiety disorder, unspecified: Secondary | ICD-10-CM

## 2020-12-24 NOTE — Telephone Encounter (Signed)
MyChart message sent to pt to schedule overdue OV Last RF: 08/15/20 RF due: yes Active med list: yes Future visit scheduled: no

## 2021-01-27 ENCOUNTER — Other Ambulatory Visit: Payer: Self-pay | Admitting: Family Medicine

## 2021-01-27 DIAGNOSIS — G2581 Restless legs syndrome: Secondary | ICD-10-CM

## 2021-01-27 NOTE — Telephone Encounter (Signed)
Requested medication (s) are due for refill today- expired Rx  Requested medication (s) are on the active medication list - yes  Future visit scheduled -no  Last refill: 12/28/20 #30 11 RF  Notes to clinic: Request RF of expired Rx- sent for review   Requested Prescriptions  Pending Prescriptions Disp Refills   pramipexole (MIRAPEX) 1 MG tablet [Pharmacy Med Name: PRAMIPEXOLE DIHYDROCHLORIDE 1 MG TA] 30 tablet 12    Sig: TAKE 1 TABLET BY MOUTH EVERY NIGHT AT BEDTIME      Neurology:  Parkinsonian Agents Passed - 01/27/2021 11:11 AM      Passed - Last BP in normal range    BP Readings from Last 1 Encounters:  05/26/20 107/68          Passed - Valid encounter within last 12 months    Recent Outpatient Visits           8 months ago Thoracic aortic atherosclerosis ([place])   [facility] Birdie Sons, [facility]   10 months ago Urinary frequency   [facility] Birdie Sons, [facility]   1 year ago Annual physical exam   [facility] Birdie Sons, [facility]   2 years ago Lower abdominal pain   [facility] Birdie Sons, [facility]   2 years ago Sinusitis, unspecified chronicity, unspecified location   [facility] Birdie Sons, MD                 Signed Prescriptions Disp Refills   ELIQUIS 5 MG TABS tablet 60 tablet 2    Sig: TAKE 1 TABLET BY MOUTH TWICE A DAY      Hematology:  Anticoagulants Passed - 01/27/2021 11:11 AM      Passed - HGB in normal range and within 360 days    Hemoglobin  Date Value Ref Range Status  05/25/2020 13.2 12.0 - 15.0 g/dL Final  04/28/2020 12.5 12.0 - 15.0 g/dL Final  12/20/2015 13.6 11.1 - 15.9 g/dL Final          Passed - PLT in normal range and within 360 days    Platelets  Date Value Ref Range Status  05/25/2020 271 150 - 400 K/uL Final  12/20/2015 282 150 - 379 x10E3/uL Final   Platelet Count  Date Value Ref Range Status  04/28/2020 155 150 - 400 K/uL Final           Passed - HCT in normal range and within 360 days    HCT  Date Value Ref Range Status  05/25/2020 40.4 36.0 - 46.0 % Final   Hematocrit  Date Value Ref Range Status  12/20/2015 41.6 34.0 - 46.6 % Final          Passed - Cr in normal range and within 360 days    Creatinine  Date Value Ref Range Status  04/28/2020 0.72 0.44 - 1.00 mg/dL Final   Creat  Date Value Ref Range Status  09/10/2017 0.70 0.50 - 0.99 mg/dL Final    Comment:    For patients >28 years of age, the reference limit for Creatinine is approximately 13% higher for people identified as African-American. .    Creatinine, Ser  Date Value Ref Range Status  05/25/2020 0.59 0.44 - 1.00 mg/dL Final          Passed - Valid encounter within last 12 months    Recent Outpatient Visits           8 months  ago Thoracic aortic atherosclerosis [facility])   [facility] Birdie Sons, MD   10 months ago Urinary frequency   [facility] Birdie Sons, MD   1 year ago Annual physical exam   [facility] Birdie Sons, MD   2 years ago Lower abdominal pain   [facility] Birdie Sons, [facility]   2 years ago Sinusitis, unspecified chronicity, unspecified location   [facility] Birdie Sons, MD                    Requested Prescriptions  Pending Prescriptions Disp Refills   pramipexole (MIRAPEX) 1 MG tablet [Pharmacy Med Name: PRAMIPEXOLE DIHYDROCHLORIDE 1 MG TA] 30 tablet 12    Sig: TAKE 1 TABLET BY MOUTH EVERY NIGHT AT BEDTIME      Neurology:  Parkinsonian Agents Passed - 01/27/2021 11:11 AM      Passed - Last BP in normal range    BP Readings from Last 1 Encounters:  05/26/20 107/68          Passed - Valid encounter within last 12 months    Recent Outpatient Visits           8 months ago Thoracic aortic atherosclerosis [facility])   [facility] Birdie Sons, MD   10 months ago Urinary frequency   [facility] Birdie Sons, MD   1 year ago Annual physical exam   [facility] Birdie Sons, MD   2 years ago Lower abdominal pain   [facility] Birdie Sons, MD   2 years ago Sinusitis, unspecified chronicity, unspecified location   [facility] Birdie Sons, MD                 Signed Prescriptions Disp Refills   ELIQUIS 5 MG TABS tablet 60 tablet 2    Sig: TAKE 1 TABLET BY MOUTH TWICE A DAY      Hematology:  Anticoagulants Passed - 01/27/2021 11:11 AM      Passed - HGB in normal range and within 360 days    Hemoglobin  Date Value Ref Range Status  05/25/2020 13.2 12.0 - 15.0 g/dL Final  04/28/2020 12.5 12.0 - 15.0 g/dL Final  12/20/2015 13.6 11.1 - 15.9 g/dL Final          Passed - PLT in normal range and within 360 days    Platelets  Date Value Ref Range Status  05/25/2020 271 150 - 400 K/uL Final  12/20/2015 282 150 - 379 x10E3/uL Final   Platelet Count  Date Value Ref Range Status  04/28/2020 155 150 - 400 K/uL Final          Passed - HCT in normal range and within 360 days    HCT  Date Value Ref Range Status  05/25/2020 40.4 36.0 - 46.0 % Final   Hematocrit  Date Value Ref Range Status  12/20/2015 41.6 34.0 - 46.6 % Final          Passed - Cr in normal range and within 360 days    Creatinine  Date Value Ref Range Status  04/28/2020 0.72 0.44 - 1.00 mg/dL Final   Creat  Date Value Ref Range Status  09/10/2017 0.70 0.50 - 0.99 mg/dL Final    Comment:    For patients >63 years of age, the reference limit for Creatinine is approximately 13% higher for people identified as African-American. Marland Kitchen  Creatinine, Ser  Date Value Ref Range Status  05/25/2020 0.59 0.44 - 1.00 mg/dL Final          Passed - Valid encounter within last 12 months    Recent Outpatient Visits           8 months ago Thoracic aortic atherosclerosis [facility])   [facility] Birdie Sons, MD   10  months ago Urinary frequency   [facility] Birdie Sons, MD   1 year ago Annual physical exam   [facility] Birdie Sons, MD   2 years ago Lower abdominal pain   [facility] Birdie Sons, MD   2 years ago Sinusitis, unspecified chronicity, unspecified location   [place], Kirstie Peri, MD

## 2021-01-29 ENCOUNTER — Other Ambulatory Visit: Payer: Self-pay

## 2021-01-29 ENCOUNTER — Emergency Department (HOSPITAL_COMMUNITY): Payer: Medicare Other

## 2021-01-29 ENCOUNTER — Inpatient Hospital Stay (HOSPITAL_COMMUNITY)
Admission: EM | Admit: 2021-01-29 | Discharge: 2021-02-05 | DRG: 208 | Disposition: E | Payer: Medicare Other | Attending: Pulmonary Disease | Admitting: Pulmonary Disease

## 2021-01-29 ENCOUNTER — Encounter (HOSPITAL_COMMUNITY): Payer: Self-pay | Admitting: Emergency Medicine

## 2021-01-29 DIAGNOSIS — E872 Acidosis: Secondary | ICD-10-CM | POA: Diagnosis present

## 2021-01-29 DIAGNOSIS — J90 Pleural effusion, not elsewhere classified: Secondary | ICD-10-CM

## 2021-01-29 DIAGNOSIS — Y95 Nosocomial condition: Secondary | ICD-10-CM | POA: Diagnosis present

## 2021-01-29 DIAGNOSIS — Z79899 Other long term (current) drug therapy: Secondary | ICD-10-CM

## 2021-01-29 DIAGNOSIS — J910 Malignant pleural effusion: Secondary | ICD-10-CM | POA: Diagnosis present

## 2021-01-29 DIAGNOSIS — Z87891 Personal history of nicotine dependence: Secondary | ICD-10-CM | POA: Diagnosis not present

## 2021-01-29 DIAGNOSIS — E1151 Type 2 diabetes mellitus with diabetic peripheral angiopathy without gangrene: Secondary | ICD-10-CM | POA: Diagnosis present

## 2021-01-29 DIAGNOSIS — K219 Gastro-esophageal reflux disease without esophagitis: Secondary | ICD-10-CM | POA: Diagnosis present

## 2021-01-29 DIAGNOSIS — Z86711 Personal history of pulmonary embolism: Secondary | ICD-10-CM

## 2021-01-29 DIAGNOSIS — J9621 Acute and chronic respiratory failure with hypoxia: Secondary | ICD-10-CM | POA: Diagnosis present

## 2021-01-29 DIAGNOSIS — J189 Pneumonia, unspecified organism: Principal | ICD-10-CM | POA: Diagnosis present

## 2021-01-29 DIAGNOSIS — R6521 Severe sepsis with septic shock: Secondary | ICD-10-CM | POA: Diagnosis not present

## 2021-01-29 DIAGNOSIS — Z96643 Presence of artificial hip joint, bilateral: Secondary | ICD-10-CM | POA: Diagnosis present

## 2021-01-29 DIAGNOSIS — Z7901 Long term (current) use of anticoagulants: Secondary | ICD-10-CM

## 2021-01-29 DIAGNOSIS — G9341 Metabolic encephalopathy: Secondary | ICD-10-CM | POA: Diagnosis present

## 2021-01-29 DIAGNOSIS — Z7189 Other specified counseling: Secondary | ICD-10-CM | POA: Diagnosis not present

## 2021-01-29 DIAGNOSIS — R0902 Hypoxemia: Secondary | ICD-10-CM

## 2021-01-29 DIAGNOSIS — Z9981 Dependence on supplemental oxygen: Secondary | ICD-10-CM

## 2021-01-29 DIAGNOSIS — J984 Other disorders of lung: Secondary | ICD-10-CM | POA: Diagnosis present

## 2021-01-29 DIAGNOSIS — Z515 Encounter for palliative care: Secondary | ICD-10-CM

## 2021-01-29 DIAGNOSIS — E871 Hypo-osmolality and hyponatremia: Secondary | ICD-10-CM | POA: Diagnosis present

## 2021-01-29 DIAGNOSIS — G2581 Restless legs syndrome: Secondary | ICD-10-CM | POA: Diagnosis present

## 2021-01-29 DIAGNOSIS — Z01818 Encounter for other preprocedural examination: Secondary | ICD-10-CM

## 2021-01-29 DIAGNOSIS — Z9049 Acquired absence of other specified parts of digestive tract: Secondary | ICD-10-CM

## 2021-01-29 DIAGNOSIS — Z923 Personal history of irradiation: Secondary | ICD-10-CM

## 2021-01-29 DIAGNOSIS — A419 Sepsis, unspecified organism: Secondary | ICD-10-CM | POA: Diagnosis not present

## 2021-01-29 DIAGNOSIS — J9622 Acute and chronic respiratory failure with hypercapnia: Secondary | ICD-10-CM | POA: Diagnosis present

## 2021-01-29 DIAGNOSIS — Z933 Colostomy status: Secondary | ICD-10-CM

## 2021-01-29 DIAGNOSIS — Z20822 Contact with and (suspected) exposure to covid-19: Secondary | ICD-10-CM | POA: Diagnosis present

## 2021-01-29 DIAGNOSIS — F419 Anxiety disorder, unspecified: Secondary | ICD-10-CM | POA: Diagnosis present

## 2021-01-29 DIAGNOSIS — E440 Moderate protein-calorie malnutrition: Secondary | ICD-10-CM | POA: Insufficient documentation

## 2021-01-29 DIAGNOSIS — Z85038 Personal history of other malignant neoplasm of large intestine: Secondary | ICD-10-CM

## 2021-01-29 DIAGNOSIS — F410 Panic disorder [episodic paroxysmal anxiety] without agoraphobia: Secondary | ICD-10-CM | POA: Diagnosis present

## 2021-01-29 DIAGNOSIS — J9602 Acute respiratory failure with hypercapnia: Secondary | ICD-10-CM

## 2021-01-29 DIAGNOSIS — C801 Malignant (primary) neoplasm, unspecified: Secondary | ICD-10-CM

## 2021-01-29 DIAGNOSIS — Z6830 Body mass index (BMI) 30.0-30.9, adult: Secondary | ICD-10-CM

## 2021-01-29 DIAGNOSIS — Z86718 Personal history of other venous thrombosis and embolism: Secondary | ICD-10-CM

## 2021-01-29 DIAGNOSIS — J9811 Atelectasis: Secondary | ICD-10-CM

## 2021-01-29 DIAGNOSIS — C3491 Malignant neoplasm of unspecified part of right bronchus or lung: Secondary | ICD-10-CM | POA: Diagnosis present

## 2021-01-29 DIAGNOSIS — I824Z1 Acute embolism and thrombosis of unspecified deep veins of right distal lower extremity: Secondary | ICD-10-CM | POA: Diagnosis present

## 2021-01-29 DIAGNOSIS — J9690 Respiratory failure, unspecified, unspecified whether with hypoxia or hypercapnia: Secondary | ICD-10-CM

## 2021-01-29 DIAGNOSIS — R0602 Shortness of breath: Secondary | ICD-10-CM | POA: Diagnosis present

## 2021-01-29 DIAGNOSIS — L8990 Pressure ulcer of unspecified site, unspecified stage: Secondary | ICD-10-CM | POA: Insufficient documentation

## 2021-01-29 DIAGNOSIS — M8580 Other specified disorders of bone density and structure, unspecified site: Secondary | ICD-10-CM | POA: Diagnosis present

## 2021-01-29 DIAGNOSIS — Z66 Do not resuscitate: Secondary | ICD-10-CM | POA: Diagnosis present

## 2021-01-29 DIAGNOSIS — Z978 Presence of other specified devices: Secondary | ICD-10-CM

## 2021-01-29 DIAGNOSIS — R531 Weakness: Secondary | ICD-10-CM | POA: Diagnosis not present

## 2021-01-29 DIAGNOSIS — I2699 Other pulmonary embolism without acute cor pulmonale: Secondary | ICD-10-CM | POA: Diagnosis present

## 2021-01-29 DIAGNOSIS — Z9221 Personal history of antineoplastic chemotherapy: Secondary | ICD-10-CM

## 2021-01-29 DIAGNOSIS — J9601 Acute respiratory failure with hypoxia: Secondary | ICD-10-CM | POA: Diagnosis not present

## 2021-01-29 DIAGNOSIS — C50411 Malignant neoplasm of upper-outer quadrant of right female breast: Secondary | ICD-10-CM | POA: Diagnosis present

## 2021-01-29 DIAGNOSIS — I739 Peripheral vascular disease, unspecified: Secondary | ICD-10-CM | POA: Diagnosis not present

## 2021-01-29 DIAGNOSIS — E1165 Type 2 diabetes mellitus with hyperglycemia: Secondary | ICD-10-CM | POA: Diagnosis not present

## 2021-01-29 LAB — COMPREHENSIVE METABOLIC PANEL
ALT: 22 U/L (ref 0–44)
AST: 14 U/L — ABNORMAL LOW (ref 15–41)
Albumin: 2 g/dL — ABNORMAL LOW (ref 3.5–5.0)
Alkaline Phosphatase: 71 U/L (ref 38–126)
Anion gap: 8 (ref 5–15)
BUN: 7 mg/dL — ABNORMAL LOW (ref 8–23)
CO2: 37 mmol/L — ABNORMAL HIGH (ref 22–32)
Calcium: 8.3 mg/dL — ABNORMAL LOW (ref 8.9–10.3)
Chloride: 90 mmol/L — ABNORMAL LOW (ref 98–111)
Creatinine, Ser: 0.4 mg/dL — ABNORMAL LOW (ref 0.44–1.00)
GFR, Estimated: >60 mL/min (ref >60)
Glucose, Bld: 107 mg/dL — ABNORMAL HIGH (ref 70–99)
Potassium: 4.3 mmol/L (ref 3.5–5.1)
Sodium: 135 mmol/L (ref 135–145)
Total Bilirubin: 0.4 mg/dL (ref 0.3–1.2)
Total Protein: 5.3 g/dL — ABNORMAL LOW (ref 6.5–8.1)

## 2021-01-29 LAB — CBC WITH DIFFERENTIAL/PLATELET
Abs Immature Granulocytes: 0.12 10*3/uL — ABNORMAL HIGH (ref 0.00–0.07)
Basophils Absolute: 0.1 10*3/uL (ref 0.0–0.1)
Basophils Relative: 0 %
Eosinophils Absolute: 0.6 10*3/uL — ABNORMAL HIGH (ref 0.0–0.5)
Eosinophils Relative: 3 %
HCT: 38 % (ref 36.0–46.0)
Hemoglobin: 11.5 g/dL — ABNORMAL LOW (ref 12.0–15.0)
Immature Granulocytes: 1 %
Lymphocytes Relative: 3 %
Lymphs Abs: 0.7 10*3/uL (ref 0.7–4.0)
MCH: 27.5 pg (ref 26.0–34.0)
MCHC: 30.3 g/dL (ref 30.0–36.0)
MCV: 90.9 fL (ref 80.0–100.0)
Monocytes Absolute: 1.9 10*3/uL — ABNORMAL HIGH (ref 0.1–1.0)
Monocytes Relative: 8 %
Neutro Abs: 20 10*3/uL — ABNORMAL HIGH (ref 1.7–7.7)
Neutrophils Relative %: 85 %
Platelets: 642 10*3/uL — ABNORMAL HIGH (ref 150–400)
RBC: 4.18 MIL/uL (ref 3.87–5.11)
RDW: 16.4 % — ABNORMAL HIGH (ref 11.5–15.5)
WBC: 23.4 10*3/uL — ABNORMAL HIGH (ref 4.0–10.5)
nRBC: 0 % (ref 0.0–0.2)

## 2021-01-29 LAB — TROPONIN I (HIGH SENSITIVITY)
Troponin I (High Sensitivity): 41 ng/L — ABNORMAL HIGH (ref <18)
Troponin I (High Sensitivity): 41 ng/L — ABNORMAL HIGH (ref <18)

## 2021-01-29 LAB — BRAIN NATRIURETIC PEPTIDE: B Natriuretic Peptide: 52.6 pg/mL (ref 0.0–100.0)

## 2021-01-29 MED ORDER — PRAMIPEXOLE DIHYDROCHLORIDE 1 MG PO TABS
1.0000 mg | ORAL_TABLET | Freq: Every day | ORAL | Status: DC
  Administered 2021-01-30 – 2021-01-31 (×3): 1 mg via ORAL
  Filled 2021-01-29 (×3): qty 1

## 2021-01-29 MED ORDER — ORAL CARE MOUTH RINSE
15.0000 mL | Freq: Two times a day (BID) | OROMUCOSAL | Status: DC
  Administered 2021-01-30 – 2021-01-31 (×4): 15 mL via OROMUCOSAL

## 2021-01-29 MED ORDER — MORPHINE SULFATE (PF) 4 MG/ML IV SOLN: 4.0000 mg | Freq: Once | INTRAVENOUS | Status: DC

## 2021-01-29 MED ORDER — SENNOSIDES-DOCUSATE SODIUM 8.6-50 MG PO TABS: 1.0000 | ORAL_TABLET | Freq: Every day | ORAL | Status: DC | PRN

## 2021-01-29 MED ORDER — ALBUTEROL SULFATE HFA 108 (90 BASE) MCG/ACT IN AERS
2.0000 | INHALATION_SPRAY | Freq: Four times a day (QID) | RESPIRATORY_TRACT | Status: DC | PRN
  Administered 2021-01-30 – 2021-01-31 (×3): 2 via RESPIRATORY_TRACT
  Filled 2021-01-29: qty 6.7

## 2021-01-29 MED ORDER — SODIUM CHLORIDE 0.9 % IV SOLN
2.0000 g | Freq: Three times a day (TID) | INTRAVENOUS | Status: DC
  Administered 2021-01-30 – 2021-02-03 (×14): 2 g via INTRAVENOUS
  Filled 2021-01-29 (×14): qty 2

## 2021-01-29 MED ORDER — SODIUM CHLORIDE 0.9 % IV SOLN
2.0000 g | Freq: Once | INTRAVENOUS | Status: AC
  Administered 2021-01-29: 2 g via INTRAVENOUS
  Filled 2021-01-29: qty 2

## 2021-01-29 MED ORDER — ONDANSETRON 4 MG PO TBDP: 8.0000 mg | ORAL_TABLET | Freq: Three times a day (TID) | ORAL | Status: DC | PRN

## 2021-01-29 MED ORDER — FENTANYL CITRATE (PF) 100 MCG/2ML IJ SOLN
50.0000 ug | Freq: Once | INTRAMUSCULAR | Status: AC
  Administered 2021-01-29: 50 ug via INTRAVENOUS
  Filled 2021-01-29: qty 2

## 2021-01-29 MED ORDER — APIXABAN 5 MG PO TABS
5.0000 mg | ORAL_TABLET | Freq: Two times a day (BID) | ORAL | Status: DC
  Administered 2021-01-29 – 2021-01-31 (×5): 5 mg via ORAL
  Filled 2021-01-29 (×6): qty 1

## 2021-01-29 MED ORDER — SODIUM CHLORIDE 0.9 % IV SOLN: 1.0000 g | Freq: Once | INTRAVENOUS | Status: DC

## 2021-01-29 MED ORDER — ONDANSETRON HCL 4 MG PO TABS: 4.0000 mg | ORAL_TABLET | Freq: Four times a day (QID) | ORAL | Status: DC | PRN

## 2021-01-29 MED ORDER — VALACYCLOVIR HCL 500 MG PO TABS
500.0000 mg | ORAL_TABLET | Freq: Every day | ORAL | Status: DC | PRN
  Filled 2021-01-29: qty 1

## 2021-01-29 MED ORDER — FENTANYL CITRATE (PF) 100 MCG/2ML IJ SOLN
50.0000 ug | Freq: Once | INTRAMUSCULAR | Status: AC
Start: 2021-01-29 — End: 2021-01-29
  Administered 2021-01-29: 50 ug via INTRAVENOUS
  Filled 2021-01-29: qty 2

## 2021-01-29 MED ORDER — VANCOMYCIN HCL 1750 MG/350ML IV SOLN
1750.0000 mg | Freq: Once | INTRAVENOUS | Status: AC
  Administered 2021-01-29: 1750 mg via INTRAVENOUS
  Filled 2021-01-29: qty 350

## 2021-01-29 MED ORDER — LIDOCAINE-PRILOCAINE 2.5-2.5 % EX CREA
1.0000 "application " | TOPICAL_CREAM | Freq: Every day | CUTANEOUS | Status: DC | PRN
  Filled 2021-01-29: qty 5

## 2021-01-29 MED ORDER — ALPRAZOLAM 1 MG PO TABS
1.5000 mg | ORAL_TABLET | Freq: Every day | ORAL | Status: DC
  Administered 2021-01-29 – 2021-02-01 (×3): 1.5 mg via ORAL
  Filled 2021-01-29 (×3): qty 1

## 2021-01-29 MED ORDER — ADULT MULTIVITAMIN W/MINERALS CH
1.0000 | ORAL_TABLET | Freq: Every day | ORAL | Status: DC
  Administered 2021-01-30 – 2021-01-31 (×2): 1 via ORAL
  Filled 2021-01-29 (×2): qty 1

## 2021-01-29 MED ORDER — VANCOMYCIN HCL 1250 MG/250ML IV SOLN
1250.0000 mg | INTRAVENOUS | Status: DC
  Administered 2021-01-30 – 2021-01-31 (×2): 1250 mg via INTRAVENOUS
  Filled 2021-01-29 (×2): qty 250

## 2021-01-29 MED ORDER — HYDROMORPHONE HCL 1 MG/ML IJ SOLN
0.5000 mg | INTRAMUSCULAR | Status: DC | PRN
  Administered 2021-01-29 – 2021-01-30 (×3): 1 mg via INTRAVENOUS
  Administered 2021-01-30 (×2): 0.5 mg via INTRAVENOUS
  Filled 2021-01-29 (×6): qty 1

## 2021-01-29 MED ORDER — ONDANSETRON HCL 4 MG/2ML IJ SOLN: 4.0000 mg | Freq: Four times a day (QID) | INTRAMUSCULAR | Status: DC | PRN

## 2021-01-29 MED ORDER — NITROGLYCERIN 0.4 MG SL SUBL: 0.4000 mg | SUBLINGUAL_TABLET | SUBLINGUAL | Status: DC | PRN

## 2021-01-29 NOTE — Progress Notes (Signed)
   01/22/2021 2302  Provider Notification  Provider Name/Title x. [facility]  Date Provider Notified 01/20/2021  Time Provider Notified 2302  Notification Type Page  Notification Reason Requested by patient/family (request for ativan for her anxiety)  Provider response No new orders (secure chat message received, pt has had fentanyl, dilaudid and xanax, will re-evaluate patient)  Date of Provider Response 01/15/2021  Time of Provider Response [phone]

## 2021-01-29 NOTE — ED Provider Notes (Signed)
[place] DEPT Provider Note   CSN: [id number] Arrival date & time: 01/14/2021  1424     History Chief Complaint  Patient presents with  . Blood In Stools  . Leg Swelling    Elizabeth [name] is a 70 y.o. female.  The history is provided by the patient, the EMS personnel and medical records.   Elizabeth [name] is a 70 y.o. female who presents to the Emergency Department complaining of shortness of breath. She presents the emergency department by EMS complaining of shortness of breath and rectal bleeding. She has a history of metastatic breast cancer and is followed by oncology at [facility]. One week ago her oxygen was increased to 6 L nasal cannula. She presents today due to worsening shortness of breath and she is unable to perform any activities without feeling significantly winded. She also reports three days of drops of blood per rectum, about three drops a couple times a day. She has associated chest discomfort today. No fevers. She vomited a week ago. She lives at home with family. Symptoms are severe, constant, worsening.    Past Medical History:  Diagnosis Date  . Anginal pain ([place])   . Anxiety   . Breast cancer, right ([place]) 04/2019   breast cancer (radical lumpectomy and radiation  . Complication of anesthesia    one time woke when she had a tube down throat and had a panic attack  . Constipation due to opioid therapy   . Degenerative joint disease (DJD) of hip   . GERD (gastroesophageal reflux disease)   . Headache    migraines when she was working  . Osteopenia   . Pneumonia   . Restless leg syndrome    takes Mirapex and xanax  . Scoliosis   . SUI (stress urinary incontinence, female)   . Vaso-vagal reaction    after back surgery    Patient Active Problem List   Diagnosis Date Noted  . Cancer-related pain 05/28/2020  . Stage IV adenocarcinoma of lung ([place]) 05/25/2020  . Pleural effusion, malignant 05/18/2020  .  Acute deep vein thrombosis (DVT) of distal end of right lower extremity ([place]) 05/18/2020  . Pleural effusion on left   . Pulmonary embolism ([place])   . Respiratory failure with hypoxia ([place]) 05/12/2020  . DOE (dyspnea on exertion) 04/18/2020  . Encounter for antineoplastic immunotherapy 03/31/2020  . Encounter for antineoplastic chemotherapy 01/12/2020  . Goals of care, counseling/discussion 01/12/2020  . Adenocarcinoma of right lung, stage 3 ([place]) 12/31/2019  . Mediastinal adenopathy 12/09/2019  . Right lower lobe pulmonary nodule 12/09/2019  . Elevated tumor markers 12/01/2019  . Localized osteoporosis without current pathological fracture 05/04/2019  . Malignant neoplasm of upper-outer quadrant of right female breast ([place]) 05/01/2019  . Age-related osteoporosis without current pathological fracture 05/01/2019  . Diverticulosis 12/05/2018  . GERD (gastroesophageal reflux disease) 01/30/2017  . Insomnia 01/30/2017  . Anxiety 09/07/2015  . Elevated WBC count 09/07/2015  . H/O total hip arthroplasty 09/07/2015  . HLD (hyperlipidemia) 09/07/2015  . Headache, migraine 09/07/2015  . Osteoarthritis 09/07/2015  . Arthropathy of temporomandibular joint 09/07/2015  . Olecranon bursitis 09/07/2015  . Major depressive disorder, single episode 09/07/2015  . Restless leg 07/08/2015  . Primary osteoarthritis of right hip 01/19/2015  . DJD (degenerative joint disease) 01/18/2015  . Left shoulder pain 10/05/2013    Past Surgical History:  Procedure Laterality Date  . APPENDECTOMY  1963  . BACK SURGERY    . BREAST BIOPSY Right 04/27/2019  Affirm Bx "X" clip-INVASIVE MAMMARY CARCINOMA,  . BREAST CYST ASPIRATION Right 1988  . BREAST EXCISIONAL BIOPSY Right 1990   benign x 3  . BREAST LUMPECTOMY Right 05/08/2019  . BREAST LUMPECTOMY WITH SENTINEL LYMPH NODE BIOPSY Right 05/08/2019   Procedure: RIGHT BREAST LUMPECTOMY WITH SENTINEL LYMPH NODE BX AND WIRE LOC., LATEX ALLERGY;  Surgeon: Jules Husbands, MD;  Location: [facility] ORS;  Service: General;  Laterality: Right;  . BREAST SURGERY    . CATARACT EXTRACTION W/PHACO Right 03/31/2018   Procedure: CATARACT EXTRACTION PHACO AND INTRAOCULAR LENS PLACEMENT ([place]) RIGHT;  Surgeon: Leandrew Koyanagi, MD;  Location: [place];  Service: Ophthalmology;  Laterality: Right;  Latex sensitivity  . CATARACT EXTRACTION W/PHACO Left 04/16/2018   Procedure: CATARACT EXTRACTION PHACO AND INTRAOCULAR LENS PLACEMENT ([place])  LEFT;  Surgeon: Leandrew Koyanagi, MD;  Location: [place];  Service: Ophthalmology;  Laterality: Left;  . EYE SURGERY     Lasik  . FINE NEEDLE ASPIRATION  12/22/2019   Procedure: FINE NEEDLE ASPIRATION (FNA) LINEAR;  Surgeon: Collene Gobble, MD;  Location: [place] [facility] [facility];  Service: Pulmonary;;  . FOOT SURGERY Bilateral   . HYSTEROTOMY    . LAMINECTOMY  2006  . ROTATOR CUFF REPAIR Bilateral   . TONSILLECTOMY     age 60  . TOTAL HIP ARTHROPLASTY Right 01/18/2015   Procedure: RIGHT TOTAL HIP ARTHROPLASTY ANTERIOR APPROACH;  Surgeon: Renette Butters, MD;  Location: [place];  Service: Orthopedics;  Laterality: Right;  . TUBAL LIGATION    . VIDEO BRONCHOSCOPY WITH ENDOBRONCHIAL ULTRASOUND N/A 12/22/2019   Procedure: VIDEO BRONCHOSCOPY WITH ENDOBRONCHIAL ULTRASOUND;  Surgeon: Collene Gobble, MD;  Location: [facility] END[facility];  Service: Pulmonary;  Laterality: N/A;     OB History    Gravida  5   Para      Term      Preterm      AB      Living  3     SAB      IAB      Ectopic      Multiple      Live Births           Obstetric Comments  1st Menstrual Cycle: 13 1st Pregnancy: 41         Family History  Problem Relation Age of Onset  . Alcohol abuse Mother   . Cancer Mother 42       lung  . Migraines Mother   . Cancer Father 60       prostate  . Diabetes Father   . Alcohol abuse Brother   . Cancer Brother        skin  . Cancer Daughter        breast  . Breast cancer Daughter 46  .  Bladder Cancer Neg Hx   . Kidney cancer Neg Hx     Social History   Tobacco Use  . Smoking status: Former Smoker    Packs/day: 0.50    Years: 20.00    Pack years: 10.00    Types: Cigarettes    Quit date: 01/07/2012    Years since quitting: 9.0  . Smokeless tobacco: Never Used  Vaping Use  . Vaping Use: Never used  Substance Use Topics  . Alcohol use: Yes    Alcohol/week: 5.0 standard drinks    Types: 5 Glasses of wine per week    Comment: red wine  . Drug use: No    Home Medications Prior  to Admission medications   Medication Sig Start Date End Date Taking? Authorizing Provider  albuterol (VENTOLIN HFA) 108 (90 Base) MCG/ACT inhaler Inhale 2 puffs into the lungs every 6 (six) hours as needed for wheezing or shortness of breath. 05/16/20  Yes Ghimire, Dante Gang, MD  ALPRAZolam Duanne Moron) 0.5 MG tablet TAKE 3 TABLETS BY MOUTH AT BEDTIME Patient taking differently: Take 1.5 mg by mouth at bedtime. 12/25/20  Yes Birdie Sons, MD  Cyanocobalamin (VITAMIN B 12 PO) Take 1 tablet by mouth daily.   Yes [provider]  ELIQUIS 5 MG TABS tablet TAKE 1 TABLET BY MOUTH TWICE A DAY Patient taking differently: Take 5 mg by mouth 2 (two) times daily. 01/27/21  Yes Birdie Sons, MD  lidocaine-prilocaine (EMLA) cream Apply to affected area once Patient taking differently: Apply 1 application topically daily as needed (prior to port being accessed.). 05/25/20  Yes Corcoran, Drue Second, MD  Multiple Vitamin (MULTIVITAMIN WITH MINERALS) TABS tablet Take 1 tablet by mouth daily.   Yes [provider]  nitroGLYCERIN (NITROSTAT) 0.4 MG SL tablet Place 1 tablet (0.4 mg total) under the tongue every 5 (five) minutes as needed for chest pain. Go to ER if third tablet is necessary Patient taking differently: Place 0.4 mg under the tongue every 5 (five) minutes x 3 doses as needed for chest pain. Go to ER if third tablet is necessary 05/03/20  Yes Fisher, Kirstie Peri, MD  ondansetron (ZOFRAN-ODT) 8  MG disintegrating tablet Take 8 mg by mouth every 8 (eight) hours as needed for nausea. 08/09/20  Yes [provider]  pramipexole (MIRAPEX) 1 MG tablet Take 1 tablet (1 mg total) by mouth at bedtime. 01/27/21 02/21/22 Yes Birdie Sons, MD  senna-docusate (SENOKOT-S) 8.6-50 MG tablet Take 1 tablet by mouth daily as needed for mild constipation. 06/03/20  Yes [provider]  valACYclovir (VALTREX) 500 MG tablet Take 1 tablet (500 mg total) by mouth daily as needed (Cold sores). Patient taking differently: Take 500 mg by mouth daily as needed (Cold sores). Cold sores 12/22/19  Yes Byrum, Rose Fillers, MD  dexamethasone (DECADRON) 4 MG tablet Take 1 tab two times a day the day before [place] chemo, then take 2 tabs once a day for 3 days starting the day after chemo. Patient not taking: Reported on 01/21/2021 05/25/20   Lequita Asal, MD  famotidine (PEPCID) 20 MG tablet One after supper Patient not taking: Reported on 02/02/2021 04/18/20   Tanda Rockers, MD  ivabradine (CORLANOR) 5 MG TABS tablet Take 2 hours prior to cardiac CT along with metoprolol (100mg  tablet) Patient not taking: No sig reported 05/12/20   Kate Sable, MD  metoprolol tartrate (LOPRESSOR) 100 MG tablet Take 1 tablet (100 mg total) by mouth once for 1 dose. Take 2 hours prior to your CT scan. Patient not taking: No sig reported 05/12/20 05/12/20  Kate Sable, MD  oxyCODONE-acetaminophen (PERCOCET/ROXICET) 5-325 MG tablet Take 1 tablet by mouth every 6 (six) hours as needed for severe pain. Patient not taking: Reported on 01/15/2021 05/25/20   Lequita Asal, MD  pantoprazole (PROTONIX) 40 MG tablet Take 1 tablet (40 mg total) by mouth daily. Take 30-60 min before first meal of the day Patient not taking: Reported on 01/14/2021 04/18/20   Tanda Rockers, MD  tamoxifen (NOLVADEX) 20 MG tablet TAKE 1 TABLET BY MOUTH ONCE DAILY Patient not taking: No sig reported 08/25/20   Lequita Asal, MD  Allergies    Zostavax [zoster vaccine live], Penicillins, Naproxen, Zoloft [sertraline hcl], and Latex  Review of Systems   Review of Systems  All other systems reviewed and are negative.   Physical Exam Updated Vital Signs BP 107/76 (BP Location: Left Arm)   Pulse (!) 117   Temp 97.8 F (36.6 C) (Oral)   Resp (!) 22   SpO2 98%   Physical Exam Vitals and nursing note reviewed.  Constitutional:      Appearance: She is well-developed.  HENT:     Head: Normocephalic and atraumatic.  Cardiovascular:     Rate and Rhythm: Regular rhythm. Tachycardia present.     Heart sounds: No murmur heard.   Pulmonary:     Effort: Pulmonary effort is normal. No respiratory distress.     Comments: Crackles in the left lung base, decreased air movement in the right lung base Abdominal:     Palpations: Abdomen is soft.     Tenderness: There is no abdominal tenderness. There is no guarding or rebound.  Musculoskeletal:        General: No tenderness.     Comments: Non pitting edema to bilateral lower extremities  Skin:    General: Skin is warm and dry.     Coloration: Skin is pale.  Neurological:     Mental Status: She is alert and oriented to person, place, and time.  Psychiatric:        Behavior: Behavior normal.     ED Results / Procedures / Treatments   Labs (all labs ordered are listed, but only abnormal results are displayed) Labs Reviewed  COMPREHENSIVE METABOLIC PANEL - Abnormal; Notable for the following components:      Result Value   Chloride 90 (*)    CO2 37 (*)    Glucose, Bld 107 (*)    BUN 7 (*)    Creatinine, Ser 0.40 (*)    Calcium 8.3 (*)    Total Protein 5.3 (*)    Albumin 2.0 (*)    AST 14 (*)    All other components within normal limits  CBC WITH DIFFERENTIAL/PLATELET - Abnormal; Notable for the following components:   WBC 23.4 (*)    Hemoglobin 11.5 (*)    RDW 16.4 (*)    Platelets 642 (*)    Neutro Abs 20.0 (*)    Monocytes Absolute 1.9 (*)     Eosinophils Absolute 0.6 (*)    Abs Immature Granulocytes 0.12 (*)    All other components within normal limits  TROPONIN I (HIGH SENSITIVITY) - Abnormal; Notable for the following components:   Troponin I (High Sensitivity) 41 (*)    All other components within normal limits  TROPONIN I (HIGH SENSITIVITY) - Abnormal; Notable for the following components:   Troponin I (High Sensitivity) 41 (*)    All other components within normal limits  SARS CORONAVIRUS 2 (TAT 6-24 HRS)  BRAIN NATRIURETIC PEPTIDE    EKG None  Radiology DG Chest Port 1 View  Result Date: 01/24/2021 CLINICAL DATA:  Shortness of breath. EXAM: PORTABLE CHEST 1 VIEW COMPARISON:  Most recent chest imaging radiograph 05/26/2020. CT 05/12/2020 FINDINGS: Rotated exam. Increased volume loss in the left hemithorax. Left pleural effusion and basilar opacity, increased from prior exam. This may be partially loculated and tracks laterally. Hazy right lung base opacity consistent with pleural effusion and associated atelectasis and or airspace disease. Drainage catheters project over the diaphragms, which may be PleurX catheters or upper abdominal drains. There are  patchy left perihilar opacities. Heart size is obscured. No pneumothorax. Surgical clips in the right axilla. Scoliosis. IMPRESSION: 1. Left pleural effusion, increased from August 2021 exam. This may be partially loculated. There is left perihilar airspace disease. 2. Hazy right lung base opacity consistent with pleural effusion and associated atelectasis and or airspace disease. 3. Bilateral drainage catheters project over the diaphragms, may be PleurX catheters or upper abdominal drains. Recommend correlation with procedural history. Electronically Signed   By: Keith Rake M.D.   On: 01/06/2021 15:35    Procedures Procedures   Medications Ordered in ED Medications  ceFEPIme (MAXIPIME) 2 g in sodium chloride 0.9 % 100 mL IVPB (has no administration in time range)   vancomycin (VANCOREADY) IVPB 1750 mg/350 mL (has no administration in time range)  fentaNYL (SUBLIMAZE) injection 50 mcg (50 mcg Intravenous Given 02/02/2021 1702)  fentaNYL (SUBLIMAZE) injection 50 mcg (50 mcg Intravenous Given 01/11/2021 1938)    ED Course  I have reviewed the triage vital signs and the nursing notes.  Pertinent labs & imaging results that were available during my care of the patient were reviewed by me and considered in my medical decision making (see chart for details).    MDM Rules/Calculators/A&P                         patient with stage IV adenocarcinoma of the lung here for evaluation of progressive shortness of breath. She has been followed by oncology at [facility]. She has worsening dyspnea despite increasing her home oxygen over the last week and now she is unable to tolerate ambulation. CBC with leukocytosis. Chest x-ray with bilateral effusions as well as infiltrate, difficult to rule out pneumonia. Will start on antibiotics for possible pneumonia. She is on her home oxygen but is uncomfortable at rest. She is anticoagulated for history of PE. Patient is requesting admission to [facility]. [facility] transfer center was consulted and discussed with on-call oncologist, Dr. Karmen Stabs. She will accept the patient in transfer but there are currently no beds available. The patient will be placed on transfer list. Hospitalist consulted for admission for ongoing care pending transfer to [facility].  Patient is in agreement with treatment plan at this point.   Final Clinical Impression(s) / ED Diagnoses Final diagnoses:  Shortness of breath  Cancer [facility])    Rx / DC Orders ED Discharge Orders    None       Quintella Reichert, MD 01/28/2021 1947

## 2021-01-29 NOTE — H&P (Signed)
History and Physical   Elizabeth [name] GGY:[id number] DOB: 1951/02/15 DOA: 01/31/2021  Referring MD/NP/PA: Dr. Ralene Bathe  PCP: Birdie Sons, MD   Outpatient Specialists: [facility]  Patient coming from: Home  Chief Complaint: Shortness of breath and pain  HPI: Elizabeth [name] is a 70 y.o. female with medical history significant of Breast cancer and stage IV lung cancer who gets her care at [facility] here in the emergency room with shortness of breath.  Patient also reported some rectal bleeding transiently.  Patient has had bilateral malignant pleural effusions requiring bilateral Pleurx catheter she gets drained every other day at home.  She has been on home O2 at 6 L.  This was increased recently.  Tonight she noted increasing shortness of breath but also reported an episode of rectal bleed.  She is on Eliquis.  This was a one-time thing.  Hemodynamically she remained stable.  Her evaluation showed bilateral pleural effusion with haziness suspicious for secondary pneumonia.  She also has some chest discomfort.  1 episode of vomiting a week ago.  Patient denied hematemesis.  She has significant history of DVT and pulmonary embolism also.  Here in the ER no bleeding observed.  Hemoglobin is 11.5.  Attempt was made to transfer patient to [facility] where she normally gets her care.  She has apparently been accepted but no bed available tonight.  They are recommending we admit the patient initiate treatment and she will be transferred to [facility] when a bed is available.  ED Course: Temperature 98 blood pressure 95/81 pulse 122, respirate of 23 oxygen sat 97% room air.  White count is 23.4 hemoglobin 11.5 platelets 642.  Sodium 135 potassium 4.3 chloride 90 CO2 37 BUN 7 creatinine 0.40 and calcium 8.3.  BNP of 52 with troponin 41.  Chest x-ray showed bilateral pleural effusion with some right lower lobe haziness  worrisome for pneumonia.  Patient will be admitted for treatment of HCAP pending transfer to [facility].  Review of Systems: As per HPI otherwise 10 point review of systems negative.    Past Medical History:  Diagnosis Date  . Anginal pain ([place])   . Anxiety   . Breast cancer, right ([place]) 04/2019   breast cancer (radical lumpectomy and radiation  . Complication of anesthesia    one time woke when she had a tube down throat and had a panic attack  . Constipation due to opioid therapy   . Degenerative joint disease (DJD) of hip   . GERD (gastroesophageal reflux disease)   . Headache    migraines when she was working  . Osteopenia   . Pneumonia   . Restless leg syndrome    takes Mirapex and xanax  . Scoliosis   . SUI (stress urinary incontinence, female)   . Vaso-vagal reaction    after back surgery    Past Surgical History:  Procedure Laterality Date  . APPENDECTOMY  1963  . BACK SURGERY    . BREAST BIOPSY Right 04/27/2019   Affirm Bx "X" clip-INVASIVE MAMMARY CARCINOMA,  . BREAST CYST ASPIRATION Right 1988  . BREAST EXCISIONAL BIOPSY Right 1990   benign x 3  . BREAST LUMPECTOMY Right 05/08/2019  . BREAST LUMPECTOMY WITH SENTINEL LYMPH NODE BIOPSY Right 05/08/2019   Procedure: RIGHT BREAST LUMPECTOMY WITH SENTINEL LYMPH NODE BX AND WIRE LOC., LATEX ALLERGY;  Surgeon: Jules Husbands, MD;  Location: [facility] ORS;  Service: General;  Laterality: Right;  . BREAST SURGERY    .  CATARACT EXTRACTION W/PHACO Right 03/31/2018   Procedure: CATARACT EXTRACTION PHACO AND INTRAOCULAR LENS PLACEMENT ([place]) RIGHT;  Surgeon: Leandrew Koyanagi, MD;  Location: [place];  Service: Ophthalmology;  Laterality: Right;  Latex sensitivity  . CATARACT EXTRACTION W/PHACO Left 04/16/2018   Procedure: CATARACT EXTRACTION PHACO AND INTRAOCULAR LENS PLACEMENT ([place])  LEFT;  Surgeon: Leandrew Koyanagi, MD;  Location: [place];  Service: Ophthalmology;  Laterality: Left;  . EYE SURGERY      Lasik  . FINE NEEDLE ASPIRATION  12/22/2019   Procedure: FINE NEEDLE ASPIRATION (FNA) LINEAR;  Surgeon: Collene Gobble, MD;  Location: [place] [facility];  Service: Pulmonary;;  . FOOT SURGERY Bilateral   . HYSTEROTOMY    . LAMINECTOMY  2006  . ROTATOR CUFF REPAIR Bilateral   . TONSILLECTOMY     age 66  . TOTAL HIP ARTHROPLASTY Right 01/18/2015   Procedure: RIGHT TOTAL HIP ARTHROPLASTY ANTERIOR APPROACH;  Surgeon: Renette Butters, MD;  Location: [place];  Service: Orthopedics;  Laterality: Right;  . TUBAL LIGATION    . VIDEO BRONCHOSCOPY WITH ENDOBRONCHIAL ULTRASOUND N/A 12/22/2019   Procedure: VIDEO BRONCHOSCOPY WITH ENDOBRONCHIAL ULTRASOUND;  Surgeon: Collene Gobble, MD;  Location: [facility]COPY;  Service: Pulmonary;  Laterality: N/A;     reports that she quit smoking about 9 years ago. Her smoking use included cigarettes. She has a 10.00 pack-year smoking history. She has never used smokeless tobacco. She reports current alcohol use of about 5.0 standard drinks of alcohol per week. She reports that she does not use drugs.  Allergies  Allergen Reactions  . Zostavax [Zoster Vaccine Live] Rash  . Penicillins Hives    Did it involve swelling of the face/tongue/throat, SOB, or low BP? No Did it involve sudden or severe rash/hives, skin peeling, or any reaction on the inside of your mouth or nose? Yes Did you need to seek medical attention at a hospital or doctor's office? Yes When did it last happen?14 or 15 If all above answers are "NO", may proceed with cephalosporin use.   . Naproxen Hives  . Zoloft [Sertraline Hcl] Hives  . Latex Itching and Rash    Condoms    Family History  Problem Relation Age of Onset  . Alcohol abuse Mother   . Cancer Mother 18       lung  . Migraines Mother   . Cancer Father 1       prostate  . Diabetes Father   . Alcohol abuse Brother   . Cancer Brother        skin  . Cancer Daughter        breast  . Breast cancer Daughter 89  . Bladder Cancer  Neg Hx   . Kidney cancer Neg Hx      Prior to Admission medications   Medication Sig Start Date End Date Taking? Authorizing Provider  albuterol (VENTOLIN HFA) 108 (90 Base) MCG/ACT inhaler Inhale 2 puffs into the lungs every 6 (six) hours as needed for wheezing or shortness of breath. 05/16/20  Yes Ghimire, Dante Gang, MD  ALPRAZolam Duanne Moron) 0.5 MG tablet TAKE 3 TABLETS BY MOUTH AT BEDTIME Patient taking differently: Take 1.5 mg by mouth at bedtime. 12/25/20  Yes Birdie Sons, MD  Cyanocobalamin (VITAMIN B 12 PO) Take 1 tablet by mouth daily.   Yes [provider]  ELIQUIS 5 MG TABS tablet TAKE 1 TABLET BY MOUTH TWICE A DAY Patient taking differently: Take 5 mg by mouth 2 (two) times daily. 01/27/21  Yes Birdie Sons, MD  lidocaine-prilocaine (EMLA) cream Apply to affected area once Patient taking differently: Apply 1 application topically daily as needed (prior to port being accessed.). 05/25/20  Yes Corcoran, Drue Second, MD  Multiple Vitamin (MULTIVITAMIN WITH MINERALS) TABS tablet Take 1 tablet by mouth daily.   Yes [provider]  nitroGLYCERIN (NITROSTAT) 0.4 MG SL tablet Place 1 tablet (0.4 mg total) under the tongue every 5 (five) minutes as needed for chest pain. Go to ER if third tablet is necessary Patient taking differently: Place 0.4 mg under the tongue every 5 (five) minutes x 3 doses as needed for chest pain. Go to ER if third tablet is necessary 05/03/20  Yes Fisher, Kirstie Peri, MD  ondansetron (ZOFRAN-ODT) 8 MG disintegrating tablet Take 8 mg by mouth every 8 (eight) hours as needed for nausea. 08/09/20  Yes [provider]  pramipexole (MIRAPEX) 1 MG tablet Take 1 tablet (1 mg total) by mouth at bedtime. 01/27/21 02/21/22 Yes Birdie Sons, MD  senna-docusate (SENOKOT-S) 8.6-50 MG tablet Take 1 tablet by mouth daily as needed for mild constipation. 06/03/20  Yes [provider]  valACYclovir (VALTREX) 500 MG tablet Take 1 tablet (500 mg total) by  mouth daily as needed (Cold sores). Patient taking differently: Take 500 mg by mouth daily as needed (Cold sores). Cold sores 12/22/19  Yes Byrum, Rose Fillers, MD  dexamethasone (DECADRON) 4 MG tablet Take 1 tab two times a day the day before [place] chemo, then take 2 tabs once a day for 3 days starting the day after chemo. Patient not taking: Reported on 01/28/2021 05/25/20   Lequita Asal, MD  famotidine (PEPCID) 20 MG tablet One after supper Patient not taking: Reported on 02/02/2021 04/18/20   Tanda Rockers, MD  ivabradine (CORLANOR) 5 MG TABS tablet Take 2 hours prior to cardiac CT along with metoprolol (100mg  tablet) Patient not taking: No sig reported 05/12/20   Kate Sable, MD  metoprolol tartrate (LOPRESSOR) 100 MG tablet Take 1 tablet (100 mg total) by mouth once for 1 dose. Take 2 hours prior to your CT scan. Patient not taking: No sig reported 05/12/20 05/12/20  Kate Sable, MD  oxyCODONE-acetaminophen (PERCOCET/ROXICET) 5-325 MG tablet Take 1 tablet by mouth every 6 (six) hours as needed for severe pain. Patient not taking: Reported on 01/10/2021 05/25/20   Lequita Asal, MD  pantoprazole (PROTONIX) 40 MG tablet Take 1 tablet (40 mg total) by mouth daily. Take 30-60 min before first meal of the day Patient not taking: Reported on 01/12/2021 04/18/20   Tanda Rockers, MD  tamoxifen (NOLVADEX) 20 MG tablet TAKE 1 TABLET BY MOUTH ONCE DAILY Patient not taking: No sig reported 08/25/20   Lequita Asal, MD    Physical Exam: Vitals:   01/20/2021 1830 02/03/2021 1900 02/03/2021 1937 01/27/2021 2130  BP: 108/82 105/73 107/76 117/75  Pulse: (!) 122 (!) 120 (!) 117 (!) 112  Resp: (!) 23 (!) 22 (!) 22 (!) 22  Temp:    98 F (36.7 C)  TempSrc:    Oral  SpO2: 98% 99% 98% 100%      Constitutional: Chronically ill looking in mild pain and distress Vitals:   01/06/2021 1830 01/28/2021 1900 01/19/2021 1937 02/02/2021 2130  BP: 108/82 105/73 107/76 117/75  Pulse: (!) 122 (!) 120 (!)  117 (!) 112  Resp: (!) 23 (!) 22 (!) 22 (!) 22  Temp:    98 F (36.7 C)  TempSrc:  Oral  SpO2: 98% 99% 98% 100%   Eyes: PERRL, lids and conjunctivae normal ENMT: Mucous membranes are dry. Posterior pharynx clear of any exudate or lesions.Normal dentition.  Neck: normal, supple, no masses, no thyromegaly Respiratory: Decreased air entry at the bases bilaterally with crackles and some rhonchi, bilateral Pleurx catheter in place. Normal respiratory effort. No accessory muscle use.  Cardiovascular: Sinus tachycardia, no murmurs / rubs / gallops. No extremity edema. 2+ pedal pulses. No carotid bruits.  Abdomen: no tenderness, no masses palpated. No hepatosplenomegaly. Bowel sounds positive.  Musculoskeletal: no clubbing / cyanosis. No joint deformity upper and lower extremities. Good ROM, no contractures. Normal muscle tone.  Skin: no rashes, lesions, ulcers. No induration Neurologic: CN 2-12 grossly intact. Sensation intact, DTR normal. Strength 5/5 in all 4.  Psychiatric: Normal judgment and insight. Alert and oriented x 3.  Depressed mood.     Labs on Admission: I have personally reviewed following labs and imaging studies  CBC: Recent Labs  Lab 01/19/2021 1444  WBC 23.4*  NEUTROABS 20.0*  HGB 11.5*  HCT 38.0  MCV 90.9  PLT 562*   Basic Metabolic Panel: Recent Labs  Lab 01/08/2021 1444  NA 135  K 4.3  CL 90*  CO2 37*  GLUCOSE 107*  BUN 7*  CREATININE 0.40*  CALCIUM 8.3*   GFR: CrCl cannot be calculated (Unknown ideal weight.). Liver Function Tests: Recent Labs  Lab 01/15/2021 1444  AST 14*  ALT 22  ALKPHOS 71  BILITOT 0.4  PROT 5.3*  ALBUMIN 2.0*   No results for input(s): LIPASE, AMYLASE in the last 168 hours. No results for input(s): AMMONIA in the last 168 hours. Coagulation Profile: No results for input(s): INR, PROTIME in the last 168 hours. Cardiac Enzymes: No results for input(s): CKTOTAL, CKMB, CKMBINDEX, TROPONINI in the last 168 hours. BNP (last 3  results) No results for input(s): PROBNP in the last 8760 hours. HbA1C: No results for input(s): HGBA1C in the last 72 hours. CBG: No results for input(s): GLUCAP in the last 168 hours. Lipid Profile: No results for input(s): CHOL, HDL, LDLCALC, TRIG, CHOLHDL, LDLDIRECT in the last 72 hours. Thyroid Function Tests: No results for input(s): TSH, T4TOTAL, FREET4, T3FREE, THYROIDAB in the last 72 hours. Anemia Panel: No results for input(s): VITAMINB12, FOLATE, FERRITIN, TIBC, IRON, RETICCTPCT in the last 72 hours. Urine analysis:    Component Value Date/Time   COLORURINE YELLOW (A) 05/12/2020 1659   APPEARANCEUR HAZY (A) 05/12/2020 1659   APPEARANCEUR Clear 02/11/2017 0933   LABSPEC 1.010 05/12/2020 1659   PHURINE 5.0 05/12/2020 1659   GLUCOSEU NEGATIVE 05/12/2020 1659   HGBUR NEGATIVE 05/12/2020 1659   BILIRUBINUR NEGATIVE 05/12/2020 1659   BILIRUBINUR negative 03/28/2020 1507   BILIRUBINUR Negative 02/11/2017 0933   KETONESUR NEGATIVE 05/12/2020 1659   PROTEINUR NEGATIVE 05/12/2020 1659   UROBILINOGEN 0.2 03/28/2020 1507   UROBILINOGEN 1.0 01/07/2015 1221   NITRITE NEGATIVE 05/12/2020 1659   LEUKOCYTESUR NEGATIVE 05/12/2020 1659   Sepsis Labs: @LABRCNTIP (procalcitonin:4,lacticidven:4) )No results found for this or any previous visit (from the past 240 hour(s)).   Radiological Exams on Admission: DG Chest Port 1 View  Result Date: 01/27/2021 CLINICAL DATA:  Shortness of breath. EXAM: PORTABLE CHEST 1 VIEW COMPARISON:  Most recent chest imaging radiograph 05/26/2020. CT 05/12/2020 FINDINGS: Rotated exam. Increased volume loss in the left hemithorax. Left pleural effusion and basilar opacity, increased from prior exam. This may be partially loculated and tracks laterally. Hazy right lung base opacity consistent with pleural effusion  and associated atelectasis and or airspace disease. Drainage catheters project over the diaphragms, which may be PleurX catheters or upper abdominal  drains. There are patchy left perihilar opacities. Heart size is obscured. No pneumothorax. Surgical clips in the right axilla. Scoliosis. IMPRESSION: 1. Left pleural effusion, increased from August 2021 exam. This may be partially loculated. There is left perihilar airspace disease. 2. Hazy right lung base opacity consistent with pleural effusion and associated atelectasis and or airspace disease. 3. Bilateral drainage catheters project over the diaphragms, may be PleurX catheters or upper abdominal drains. Recommend correlation with procedural history. Electronically Signed   By: Keith Rake M.D.   On: 01/20/2021 15:35      Assessment/Plan Principal Problem:   HCAP (healthcare-associated pneumonia) Active Problems:   Anxiety   GERD (gastroesophageal reflux disease)   Adenocarcinoma of right lung, stage 3 (HCC)   Pulmonary embolism (HCC)   Pleural effusion, malignant   Acute deep vein thrombosis (DVT) of distal end of right lower extremity ([place])     #1 Healthcare associated pneumonia: Probably postobstructive.  Patient will be admitted and initiated on Vanco and cefepime.  Blood cultures obtained.  Plan is to transfer to [facility] when bed is available.  #2 malignant pleural effusion: Patient has Pleurx catheter.  She has a scheduled drainage every other day.  We will continue to do that while here until she is transferred.  Her oncologist at [facility] will continue with her care at discharge.  #3 reported rectal bleed: No evidence of bleed at the moment.  She is on Eliquis.  It is a judgment call as patient is at risk for bleeding if she is actively bleeding rather but at the same time has had extensive history of DVT and PE.  I will empirically keep her on the Eliquis but if any bleeding occurs we will stop it and treat accordingly.  Monitor H&H.  #4 GERD: Continue with PPIs  #5 anxiety disorder: Continue with home regimen.   DVT prophylaxis: Eliquis Code Status: Full  code Family Communication: Significant other at bedside Disposition Plan: Possible transfer to [facility] when a bed is ready Consults called: None Admission status: Inpatient  Severity of Illness: The appropriate patient status for this patient is INPATIENT. Inpatient status is judged to be reasonable and necessary in order to provide the required intensity of service to ensure the patient's safety. The patient's presenting symptoms, physical exam findings, and initial radiographic and laboratory data in the context of their chronic comorbidities is felt to place them at high risk for further clinical deterioration. Furthermore, it is not anticipated that the patient will be medically stable for discharge from the hospital within 2 midnights of admission. The following factors support the patient status of inpatient.   " The patient's presenting symptoms include shortness of breath and rectal bleed. " The worrisome physical exam findings include bilateral Pleurx catheter was decreased air entry and coarse breath sound. " The initial radiographic and laboratory data are worrisome because of evidence of bilateral pleural effusion and pneumonia. " The chronic co-morbidities include malignant cancer of the lung.   * I certify that at the point of admission it is my clinical judgment that the patient will require inpatient hospital care spanning beyond 2 midnights from the point of admission due to high intensity of service, high risk for further deterioration and high frequency of surveillance required.Barbette Merino MD [facility] Pager [phone]  If 7PM-7AM, please contact night-coverage  [email or web address] Password [facility]  01/09/2021, 10:22 PM

## 2021-01-29 NOTE — Progress Notes (Signed)
   01/07/2021 2235  Assess: MEWS Score  Temp 98.1 F (36.7 C)  BP (!) 144/85  Pulse Rate (!) 122  Resp (!) 22  SpO2 97 %  O2 Device Nasal Cannula  O2 Flow Rate (L/min) 6 L/min  Assess: MEWS Score  MEWS Temp 0  MEWS Systolic 0  MEWS Pulse 2  MEWS RR 1  MEWS LOC 0  MEWS Score 3  MEWS Score Color Yellow  Assess: if the MEWS score is Yellow or Red  Were vital signs taken at a resting state? Yes  Focused Assessment Change from prior assessment (see assessment flowsheet)  Early Detection of Sepsis Score *See Row Information* Low  MEWS guidelines implemented *See Row Information* Yes  Treat  MEWS Interventions Administered prn meds/treatments  Take Vital Signs  Increase Vital Sign Frequency  Yellow: Q 2hr X 2 then Q 4hr X 2, if remains yellow, continue Q 4hrs  Escalate  MEWS: Escalate Yellow: discuss with charge nurse/RN and consider discussing with provider and RRT  Notify: Charge Nurse/RN  Name of Charge Nurse/RN Notified Huei, RN  Date Charge Nurse/RN Notified 01/22/2021  Time Charge Nurse/RN Notified 2339  Document  Patient Outcome Stabilized after interventions  Progress note created (see row info) Yes

## 2021-01-29 NOTE — Progress Notes (Signed)
A consult was received from an ED physician for vancomycin per pharmacy dosing.  The patient's profile has been reviewed for ht/wt/allergies/indication/available labs.   A one time order has been placed for vancomycin 1750 mg iv once. Cefepime 1 g iv ordered per EDP, will adjust dose to 2g.  Further antibiotics/pharmacy consults should be ordered by admitting physician if indicated.                       Thank you, Napoleon Form 01/10/2021  6:46 PM

## 2021-01-29 NOTE — Progress Notes (Signed)
Pharmacy Antibiotic Note  Elizabeth [name] is a 70 y.o. female admitted on 02/02/2021 with SOB and rectal bleeding.  Pt has hx of metastatic breast cancer  Pharmacy has been consulted to dose vancomycin and cefepime for pna.  1st doses given in ED  Plan: Vancomycin 1250mg  IV q24h (AUC 496.3, Used Scr 0.8) Cefepime 2gm IV q8h Follow renal function, cultures and clinical course       Temp (24hrs), Avg:97.9 F (36.6 C), Min:97.8 F (36.6 C), Max:98 F (36.7 C)  Recent Labs  Lab 02/03/2021 1444  WBC 23.4*  CREATININE 0.40*    CrCl cannot be calculated (Unknown ideal weight.).    Allergies  Allergen Reactions  . Zostavax [Zoster Vaccine Live] Rash  . Penicillins Hives    Did it involve swelling of the face/tongue/throat, SOB, or low BP? No Did it involve sudden or severe rash/hives, skin peeling, or any reaction on the inside of your mouth or nose? Yes Did you need to seek medical attention at a hospital or doctor's office? Yes When did it last happen?14 or 15 If all above answers are "NO", may proceed with cephalosporin use.   . Naproxen Hives  . Zoloft [Sertraline Hcl] Hives  . Latex Itching and Rash    Condoms    Antimicrobials this admission: 4/24 vanc >> 4/24 cefepime >> Dose adjustments this admission:   Microbiology results:  Thank you for allowing pharmacy to be a part of this patient's care.  Dolly Rias RPh 01/27/2021, 10:46 PM

## 2021-01-29 NOTE — ED Triage Notes (Signed)
Patient here from home reporting hx of lung cancer. Noticed blood from rectum and bilateral leg swelling.

## 2021-01-30 DIAGNOSIS — J189 Pneumonia, unspecified organism: Secondary | ICD-10-CM | POA: Diagnosis not present

## 2021-01-30 DIAGNOSIS — C3491 Malignant neoplasm of unspecified part of right bronchus or lung: Secondary | ICD-10-CM

## 2021-01-30 LAB — CBC
HCT: 45.5 % (ref 36.0–46.0)
Hemoglobin: 13.7 g/dL (ref 12.0–15.0)
MCH: 27.5 pg (ref 26.0–34.0)
MCHC: 30.1 g/dL (ref 30.0–36.0)
MCV: 91.4 fL (ref 80.0–100.0)
Platelets: 607 10*3/uL — ABNORMAL HIGH (ref 150–400)
RBC: 4.98 MIL/uL (ref 3.87–5.11)
RDW: 16.8 % — ABNORMAL HIGH (ref 11.5–15.5)
WBC: 26.7 10*3/uL — ABNORMAL HIGH (ref 4.0–10.5)
nRBC: 0 % (ref 0.0–0.2)

## 2021-01-30 LAB — COMPREHENSIVE METABOLIC PANEL
ALT: 20 U/L (ref 0–44)
AST: 15 U/L (ref 15–41)
Albumin: 2 g/dL — ABNORMAL LOW (ref 3.5–5.0)
Alkaline Phosphatase: 71 U/L (ref 38–126)
Anion gap: 9 (ref 5–15)
BUN: 7 mg/dL — ABNORMAL LOW (ref 8–23)
CO2: 33 mmol/L — ABNORMAL HIGH (ref 22–32)
Calcium: 8.7 mg/dL — ABNORMAL LOW (ref 8.9–10.3)
Chloride: 94 mmol/L — ABNORMAL LOW (ref 98–111)
Creatinine, Ser: 0.41 mg/dL — ABNORMAL LOW (ref 0.44–1.00)
GFR, Estimated: >60 mL/min (ref >60)
Glucose, Bld: 101 mg/dL — ABNORMAL HIGH (ref 70–99)
Potassium: 5 mmol/L (ref 3.5–5.1)
Sodium: 136 mmol/L (ref 135–145)
Total Bilirubin: 0.4 mg/dL (ref 0.3–1.2)
Total Protein: 5.7 g/dL — ABNORMAL LOW (ref 6.5–8.1)

## 2021-01-30 LAB — SARS CORONAVIRUS 2 (TAT 6-24 HRS): SARS Coronavirus 2: NEGATIVE

## 2021-01-30 LAB — MRSA PCR SCREENING: MRSA by PCR: NEGATIVE

## 2021-01-30 MED ORDER — BOOST PLUS PO LIQD
237.0000 mL | Freq: Three times a day (TID) | ORAL | Status: DC
  Administered 2021-01-30 – 2021-01-31 (×2): 237 mL via ORAL
  Filled 2021-01-30 (×6): qty 237

## 2021-01-30 MED ORDER — ALPRAZOLAM 0.5 MG PO TABS
0.5000 mg | ORAL_TABLET | Freq: Two times a day (BID) | ORAL | Status: DC | PRN
  Administered 2021-01-30 – 2021-01-31 (×3): 0.5 mg via ORAL
  Filled 2021-01-30 (×3): qty 1

## 2021-01-30 NOTE — Discharge Instructions (Signed)
[facility] Hospital Stay Proper nutrition can help your body recover from illness and injury.   Foods and beverages high in protein, vitamins, and minerals help rebuild muscle loss, promote healing, & reduce fall risk.   .In addition to eating healthy foods, a nutrition shake is an easy, delicious way to get the nutrition you need during and after your hospital stay  It is recommended that you continue to drink 2-3 bottles per day of: Boost Plus for at least 1 month (30 days) after your hospital stay   Tips for adding a nutrition shake into your routine: As allowed, drink one with vitamins or medications instead of water or juice Enjoy one as a tasty mid-morning or afternoon snack Drink cold or make a milkshake out of it Drink one instead of milk with cereal or snacks Use as a coffee creamer   Available at the following grocery stores and pharmacies:           * [facility] [phone]            For COUPONS visit: www.[email or web address].com/join or [email or web address]   Suggested Substitutions Ensure Plus = Boost Plus = Carnation Breakfast Essentials = Boost Compact Ensure Active Clear = Boost Breeze Glucerna Shake = Boost Glucose Control = Carnation Breakfast Essentials SUGAR FREE

## 2021-01-30 NOTE — Progress Notes (Signed)
Initial Nutrition Assessment  DOCUMENTATION CODES:  Non-severe (moderate) malnutrition in context of chronic illness  INTERVENTION:  Obtain updated weight when possible.  Continue current diet order.  Add Boost Plus po TID, each supplement provides 360 kcal and 14 grams of protein.  Add Magic cup TID with meals, each supplement provides 290 kcal and 9 grams of protein.  Continue MVI with minerals daily.  NUTRITION DIAGNOSIS:  Moderate Malnutrition related to chronic illness,cancer and cancer related treatments as evidenced by mild fat depletion,moderate fat depletion,mild muscle depletion,moderate muscle depletion,per patient/family report,edema.  GOAL:  Patient will meet greater than or equal to 90% of their needs  MONITOR:  PO intake,Supplement acceptance,Labs,Weight trends,I & O's  REASON FOR ASSESSMENT:  Malnutrition Screening Tool    ASSESSMENT:  70 yo female with a PMH of GERD, DJD, osteopenia, anxiety, breast cancer s/p radical R lumpectomy and radiation, and stage IV lung cancer who presents with HCAP. Plans to transfer to [facility] once bed is available.  Spoke with pt and husband at bedside. Pt reports that she has had decreased appetite for quite a while now and not many kinds of food sounds appealing.   She reports that she has lost weight, but is unsure of how much. Pt's weight has not been updated since 05/2020. She reports, and her husband confirms, that she weighed 147 lbs (66.8 kg) in February. After 2 attempts, RD unable to get accurate weight from the bed scale.  On exam, pt has many depletions in face and throughout her body, indicating moderate malnutrition. She is at risk for severe malnutrition.  Recommend adding Boost Plus (pt preference to Ensure) and Magic Cup both TID to promote intake. Also recommend continuing MVI with minerals.  Medications: MVI with minerals, vancomycin, cefepime, Dilaudid, Xanax Labs: reviewed; Glucose 101  NUTRITION - FOCUSED  PHYSICAL EXAM: Flowsheet Row Most Recent Value  Orbital Region Moderate depletion  Upper Arm Region Mild depletion  Thoracic and Lumbar Region Mild depletion  Buccal Region Moderate depletion  Temple Region Moderate depletion  Clavicle Bone Region Moderate depletion  Clavicle and Acromion Bone Region Moderate depletion  Scapular Bone Region Unable to assess  Dorsal Hand Mild depletion  Patellar Region Mild depletion  Anterior Thigh Region Mild depletion  Posterior Calf Region Mild depletion  Edema (RD Assessment) Moderate  Hair Reviewed  Eyes Reviewed  Mouth Reviewed  Skin Reviewed  Nails Reviewed     Diet Order:   Diet Order            Diet regular Room service appropriate? Yes; Fluid consistency: Thin  Diet effective now                EDUCATION NEEDS:  Education needs have been addressed  Skin:  Skin Assessment: Reviewed RN Assessment  Last BM:  01/25/2021 - ostomy  Height:  Ht Readings from Last 1 Encounters:  02/02/2021 5\' 2"  (1.575 m)   Weight:  Wt Readings from Last 1 Encounters:  05/25/20 76.9 kg   Ideal Body Weight:  50 kg  BMI:  Body mass index is 30.73 kg/m.  Estimated Nutritional Needs: used 66.8 kg for needs Kcal:  2200-2400 Protein:  90-105 grams Fluid:  >2 L  Derrel Nip, RD, LDN Registered Dietitian After Hours/Weekend Pager # in [place]

## 2021-01-30 NOTE — Progress Notes (Signed)
PROGRESS NOTE    Elizabeth [name]  PJK:[id number] DOB: [birth year] DOA: 02/01/2021 PCP: Birdie Sons, MD    Brief Narrative:  70 y.o. female with medical history significant of Breast cancer and stage IV lung cancer who gets her care at [facility] here in the emergency room with shortness of breath.  Patient also reported some rectal bleeding transiently.  Patient has had bilateral malignant pleural effusions requiring bilateral Pleurx catheter she gets drained every other day at home.  She has been on home O2 at 6 L.  This was increased recently. Pt noted increased sob, found to have PNA at time of presentation. Attempt was made to transfer to [facility] from ED, however, given lack of bed availability, pt was admitted to [facility] where pt remains on wait list for bed at [facility]:   Principal Problem:   HCAP (healthcare-associated pneumonia) Active Problems:   Anxiety   GERD (gastroesophageal reflux disease)   Malignant neoplasm of upper-outer quadrant of right female breast ([place])   Adenocarcinoma of right lung, stage 3 (HCC)   Pulmonary embolism (HCC)   Pleural effusion, malignant   Acute deep vein thrombosis (DVT) of distal end of right lower extremity ([place])  #1 Healthcare associated pneumonia:  -suspect postobstructive PNA.   Pt is continued on empiric Vanco and cefepime.   -Follow up on Blood cultures  -Pt had already been accepted to [facility], accepted by Dr. Karmen Stabs -Called [facility] this AM, confirmed pt remains on wait list for bed  #2 malignant pleural effusion:  -Patient has Pleurx catheter, cont to drain as needed - Her oncologist at [facility] will continue with her care at discharge.  #3 reported rectal bleed:  -No evidence of bleed at the moment.   -Pt was continued on Eliquis.  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  DVT prophylaxis: Eliquis Code Status: Full Family Communication: Pt in room, family is at bedside  Status is: Inpatient  Remains inpatient appropriate because:Inpatient level of care appropriate due to severity of illness   Dispo: The patient is from: Home              Anticipated d/c is to: Home              Patient currently is not medically stable to d/c.   Difficult to place patient No       Consultants:     Procedures:     Antimicrobials: Anti-infectives (From admission, onward)   Start     Dose/Rate Route Frequency Ordered Stop   01/30/21 2000  vancomycin (VANCOREADY) IVPB 1250 mg/250 mL        1,250 mg 166.7 mL/hr over 90 Minutes Intravenous Every 24 hours 01/17/2021 2247     01/30/21 0400  ceFEPIme (MAXIPIME) 2 g in sodium chloride 0.9 % 100 mL IVPB        2 g 200 mL/hr over 30 Minutes Intravenous Every 8 hours 01/30/2021 2247     01/28/2021 2219  valACYclovir (VALTREX) tablet 500 mg       Note to Pharmacy: Patient taking differently: Cold sores     500 mg Oral Daily PRN 01/15/2021 2221     01/25/2021 1930  vancomycin (VANCOREADY) IVPB 1750 mg/350 mL        1,750 mg 175  mL/hr over 120 Minutes Intravenous  Once 01/20/2021 1846 01/25/2021 2205   01/19/2021 1900  ceFEPIme (MAXIPIME) 2 g in sodium chloride 0.9 % 100 mL IVPB        2 g 200 mL/hr over 30 Minutes Intravenous  Once 01/14/2021 1846 01/28/2021 2000   01/06/2021 1845  ceFEPIme (MAXIPIME) 1 g in sodium chloride 0.9 % 100 mL IVPB  Status:  Discontinued        1 g 200 mL/hr over 30 Minutes Intravenous  Once 01/08/2021 1832 01/30/2021 1845       Subjective: Complaining of sob this AM  Objective: Vitals:   01/30/21 0450 01/30/21 0635 01/30/21 0948 01/30/21 1302  BP: (!) 134/105 118/78 106/84 127/78  Pulse: (!) 123 (!) 114 (!) 125 (!) 123  Resp: 20  18 18   Temp:  98 F (36.7 C) 97.8 F (36.6 C)   TempSrc:  Oral Oral   SpO2: 94% 96% 97% 97%  Height:         Intake/Output Summary (Last 24 hours) at 01/30/2021 1544 Last data filed at 01/30/2021 1201 Gross per 24 hour  Intake 585.29 ml  Output 700 ml  Net -114.71 ml   There were no vitals filed for this visit.  Examination: General exam: Awake, laying in bed, in nad Respiratory system: Increased respiratory effort, no wheezing Cardiovascular system: regular rate, s1, s2 Gastrointestinal system: Soft, nondistended, positive BS Central nervous system: CN2-12 grossly intact, strength intact Extremities: Perfused, no clubbing Skin: Normal skin turgor, no notable skin lesions seen Psychiatry: Mood normal // no visual hallucinations   Data Reviewed: I have personally reviewed following labs and imaging studies  CBC: Recent Labs  Lab 01/30/2021 1444 01/30/21 0441  WBC 23.4* 26.7*  NEUTROABS 20.0*  --   HGB 11.5* 13.7  HCT 38.0 45.5  MCV 90.9 91.4  PLT 642* 810*   Basic Metabolic Panel: Recent Labs  Lab 01/28/2021 1444 01/30/21 0441  NA 135 136  K 4.3 5.0  CL 90* 94*  CO2 37* 33*  GLUCOSE 107* 101*  BUN 7* 7*  CREATININE 0.40* 0.41*  CALCIUM 8.3* 8.7*   GFR: CrCl cannot be calculated (Unknown ideal weight.). Liver Function Tests: Recent Labs  Lab 01/09/2021 1444 01/30/21 0441  AST 14* 15  ALT 22 20  ALKPHOS 71 71  BILITOT 0.4 0.4  PROT 5.3* 5.7*  ALBUMIN 2.0* 2.0*   No results for input(s): LIPASE, AMYLASE in the last 168 hours. No results for input(s): AMMONIA in the last 168 hours. Coagulation Profile: No results for input(s): INR, PROTIME in the last 168 hours. Cardiac Enzymes: No results for input(s): CKTOTAL, CKMB, CKMBINDEX, TROPONINI in the last 168 hours. BNP (last 3 results) No results for input(s): PROBNP in the last 8760 hours. HbA1C: No results for input(s): HGBA1C in the last 72 hours. CBG: No results for input(s): GLUCAP in the last 168 hours. Lipid Profile: No results for input(s): CHOL, HDL, LDLCALC, TRIG, CHOLHDL, LDLDIRECT in the last 72  hours. Thyroid Function Tests: No results for input(s): TSH, T4TOTAL, FREET4, T3FREE, THYROIDAB in the last 72 hours. Anemia Panel: No results for input(s): VITAMINB12, FOLATE, FERRITIN, TIBC, IRON, RETICCTPCT in the last 72 hours. Sepsis Labs: No results for input(s): PROCALCITON, LATICACIDVEN in the last 168 hours.  Recent Results (from the past 240 hour(s))  SARS CORONAVIRUS 2 (TAT 6-24 HRS) Nasopharyngeal Nasopharyngeal Swab     Status: None   Collection Time: 02/03/2021  7:43 PM   Specimen: Nasopharyngeal Swab  Result  Value Ref Range Status   SARS Coronavirus 2 NEGATIVE NEGATIVE Final    Comment: (NOTE) SARS-CoV-2 target nucleic acids are NOT DETECTED.  The SARS-CoV-2 RNA is generally detectable in upper and lower respiratory specimens during the acute phase of infection. Negative results do not preclude SARS-CoV-2 infection, do not rule out co-infections with other pathogens, and should not be used as the sole basis for treatment or other patient management decisions. Negative results must be combined with clinical observations, patient history, and epidemiological information. The expected result is Negative.  Fact Sheet for Patients: SugarRoll.be  Fact Sheet for Healthcare Providers: https://www.woods-mathews.com/  This test is not yet approved or cleared by the [place] FDA and  has been authorized for detection and/or diagnosis of SARS-CoV-2 by FDA under an Emergency Use Authorization (EUA). This EUA will remain  in effect (meaning this test can be used) for the duration of the COVID-19 declaration under Se ction 564(b)(1) of the Act, 21 U.S.C. section 360bbb-3(b)(1), unless the authorization is terminated or revoked sooner.  Performed at [facility], [place] [address] [place], [place] [address]   MRSA PCR Screening     Status: None   Collection Time: 01/30/21 12:53 PM   Specimen: Nasal Mucosa; Nasopharyngeal   Result Value Ref Range Status   MRSA by PCR NEGATIVE NEGATIVE Final    Comment:        The GeneXpert MRSA Assay (FDA approved for NASAL specimens only), is one component of a comprehensive MRSA colonization surveillance program. It is not intended to diagnose MRSA infection nor to guide or monitor treatment for MRSA infections. Performed at [facility], [place] [address] [place], [place] [address]      Radiology Studies: [facility] Chest Port 1 View  Result Date: 01/15/2021 CLINICAL DATA:  Shortness of breath. EXAM: PORTABLE CHEST 1 VIEW COMPARISON:  Most recent chest imaging radiograph 05/26/2020. CT 05/12/2020 FINDINGS: Rotated exam. Increased volume loss in the left hemithorax. Left pleural effusion and basilar opacity, increased from prior exam. This may be partially loculated and tracks laterally. Hazy right lung base opacity consistent with pleural effusion and associated atelectasis and or airspace disease. Drainage catheters project over the diaphragms, which may be PleurX catheters or upper abdominal drains. There are patchy left perihilar opacities. Heart size is obscured. No pneumothorax. Surgical clips in the right axilla. Scoliosis. IMPRESSION: 1. Left pleural effusion, increased from August 2021 exam. This may be partially loculated. There is left perihilar airspace disease. 2. Hazy right lung base opacity consistent with pleural effusion and associated atelectasis and or airspace disease. 3. Bilateral drainage catheters project over the diaphragms, may be PleurX catheters or upper abdominal drains. Recommend correlation with procedural history. Electronically Signed   By: Keith Rake M.D.   On: 01/28/2021 15:35    Scheduled Meds: . ALPRAZolam  1.5 mg Oral QHS  . apixaban  5 mg Oral BID  . lactose free nutrition  237 mL Oral TID WC  . mouth rinse  15 mL Mouth Rinse BID  . multivitamin with minerals  1 tablet Oral Daily  . pramipexole  1 mg Oral QHS    Continuous Infusions: . ceFEPime (MAXIPIME) IV 2 g (01/30/21 1252)  . vancomycin       LOS: 1 day   Marylu Lund, MD [facility] Pager On Amion  If 7PM-7AM, please contact night-coverage 01/30/2021, 3:44 PM

## 2021-01-31 ENCOUNTER — Inpatient Hospital Stay (HOSPITAL_COMMUNITY): Payer: Medicare Other

## 2021-01-31 DIAGNOSIS — C801 Malignant (primary) neoplasm, unspecified: Secondary | ICD-10-CM | POA: Diagnosis not present

## 2021-01-31 DIAGNOSIS — J910 Malignant pleural effusion: Secondary | ICD-10-CM

## 2021-01-31 DIAGNOSIS — J189 Pneumonia, unspecified organism: Principal | ICD-10-CM

## 2021-01-31 DIAGNOSIS — C3491 Malignant neoplasm of unspecified part of right bronchus or lung: Secondary | ICD-10-CM | POA: Diagnosis not present

## 2021-01-31 DIAGNOSIS — E440 Moderate protein-calorie malnutrition: Secondary | ICD-10-CM | POA: Insufficient documentation

## 2021-01-31 LAB — URINALYSIS, ROUTINE W REFLEX MICROSCOPIC
Bilirubin Urine: NEGATIVE
Glucose, UA: NEGATIVE mg/dL
Hgb urine dipstick: NEGATIVE
Ketones, ur: 20 mg/dL — AB
Leukocytes,Ua: NEGATIVE
Nitrite: NEGATIVE
Protein, ur: NEGATIVE mg/dL
Specific Gravity, Urine: 1.01 (ref 1.005–1.030)
pH: 6 (ref 5.0–8.0)

## 2021-01-31 LAB — COMPREHENSIVE METABOLIC PANEL
ALT: 16 U/L (ref 0–44)
AST: 14 U/L — ABNORMAL LOW (ref 15–41)
Albumin: 1.8 g/dL — ABNORMAL LOW (ref 3.5–5.0)
Alkaline Phosphatase: 61 U/L (ref 38–126)
Anion gap: 6 (ref 5–15)
BUN: 11 mg/dL (ref 8–23)
CO2: 31 mmol/L (ref 22–32)
Calcium: 8.4 mg/dL — ABNORMAL LOW (ref 8.9–10.3)
Chloride: 94 mmol/L — ABNORMAL LOW (ref 98–111)
Creatinine, Ser: 0.4 mg/dL — ABNORMAL LOW (ref 0.44–1.00)
GFR, Estimated: >60 mL/min (ref >60)
Glucose, Bld: 126 mg/dL — ABNORMAL HIGH (ref 70–99)
Potassium: 5.3 mmol/L — ABNORMAL HIGH (ref 3.5–5.1)
Sodium: 131 mmol/L — ABNORMAL LOW (ref 135–145)
Total Bilirubin: 0.4 mg/dL (ref 0.3–1.2)
Total Protein: 5 g/dL — ABNORMAL LOW (ref 6.5–8.1)

## 2021-01-31 LAB — CBC
HCT: 38.5 % (ref 36.0–46.0)
Hemoglobin: 12 g/dL (ref 12.0–15.0)
MCH: 27.5 pg (ref 26.0–34.0)
MCHC: 31.2 g/dL (ref 30.0–36.0)
MCV: 88.3 fL (ref 80.0–100.0)
Platelets: 641 10*3/uL — ABNORMAL HIGH (ref 150–400)
RBC: 4.36 MIL/uL (ref 3.87–5.11)
RDW: 16.4 % — ABNORMAL HIGH (ref 11.5–15.5)
WBC: 23.8 10*3/uL — ABNORMAL HIGH (ref 4.0–10.5)
nRBC: 0 % (ref 0.0–0.2)

## 2021-01-31 MED ORDER — SODIUM CHLORIDE 0.9 % IV BOLUS
500.0000 mL | Freq: Once | INTRAVENOUS | Status: AC
  Administered 2021-01-31: 500 mL via INTRAVENOUS

## 2021-01-31 MED ORDER — HYDROMORPHONE HCL 1 MG/ML IJ SOLN
0.5000 mg | INTRAMUSCULAR | Status: DC | PRN
  Administered 2021-01-31 (×2): 1 mg via INTRAVENOUS
  Filled 2021-01-31 (×2): qty 1

## 2021-01-31 MED ORDER — SODIUM CHLORIDE 0.9% FLUSH: 10.0000 mL | INTRAVENOUS | Status: DC | PRN

## 2021-01-31 MED ORDER — IOHEXOL 300 MG/ML  SOLN
75.0000 mL | Freq: Once | INTRAMUSCULAR | Status: AC | PRN
  Administered 2021-01-31: 75 mL via INTRAVENOUS

## 2021-01-31 NOTE — Progress Notes (Signed)
Pt gave permission for this writer to give update to daughter [place]. Phone #: [phone]. Update given.

## 2021-01-31 NOTE — Progress Notes (Signed)
RN notes patient looks different from baseline. Patient extremely fatigued.Takes more effort to arouse patient than yesterdat. Vitals signs check resulted in  BP of 84/61, HR 112, O2 98% on 7 liters. RN paged attending to notify him of this concern. Attending called and ordered 500 ml bolus for patient. Bolus currently being administered to patient. Current BP 93/78 HR 109, and 02 98% on 7 liters.

## 2021-01-31 NOTE — Progress Notes (Signed)
Lab technician unable to draw second set of blood culture due to poor patient/s  veinous access . MD aware. Advised to hold off for now and  retry blood draw at a later time.

## 2021-01-31 NOTE — Progress Notes (Addendum)
PROGRESS NOTE    Elizabeth [name]  DTO:[id number] DOB: 1951/01/05 DOA: 01/24/2021 PCP: Birdie Sons, MD    Brief Narrative:  70 y.o. female with medical history significant of Breast cancer and stage IV lung cancer who gets her care at [facility] here in the emergency room with shortness of breath.  Patient also reported some rectal bleeding transiently.  Patient has had bilateral malignant pleural effusions requiring bilateral Pleurx catheter she gets drained every other day at home.  She has been on home O2 at 6 L.  This was increased recently. Pt noted increased sob, found to have PNA at time of presentation. Attempt was made to transfer to [facility] from ED, however, given lack of bed availability, pt was admitted to [facility] where pt remains on wait list for bed at [facility]:   Principal Problem:   HCAP (healthcare-associated pneumonia) Active Problems:   Anxiety   GERD (gastroesophageal reflux disease)   Malignant neoplasm of upper-outer quadrant of right female breast ([place])   Adenocarcinoma of right lung, stage 3 (HCC)   Pulmonary embolism (HCC)   Pleural effusion, malignant   Acute deep vein thrombosis (DVT) of distal end of right lower extremity ([place])  #1 Healthcare associated pneumonia:  -suspect postobstructive PNA.   Pt is continued on empiric Vanco and cefepime.   -Follow up on Blood cultures pending  -Pt was initially accepted to [place], accepted by Dr. Karmen Stabs, however was admitted to [facility] given no bed availability at [facility] -O2 requirements noted to increase from 4L to 7L this AM -Ordered and reviewed repeat  CXR. Findings of large L sided effusion. Moderate effusion on R. Pt drains L and R pleurx catheters alternating days, L was drained 4/25, R was drained 4/26 -Have ordered follow up CT chest  #2 malignant pleural effusion:  -Patient has Pleurx catheter, cont to drain as needed per above -Discussed case with Dr. Fanny Skates this  afternoon. Appreciate assistance by pt's primary Oncology team at [facility]. There is concern of patient's rapid decline with consideration for focus more on palliation.  -Pt's primary Oncologist is to contact patient/family today before deciding on formal Palliative Care consult, will follow  #3 reported rectal bleed:  -No evidence of bleed at the moment.   -Pt was continued on Eliquis.  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 -Concerns for increased tiredness this AM, thus have d/c'd PRN daytime xanax  UPDATE: Later discussed case with pt's primary Oncologist, Dr. Sharlet Salina, from [facility]. Dr. Sharlet Salina did speak with pt and family. Plan is still for continued plan of care, draining caths as tolerated. Dr. Sharlet Salina had recommended formal Pulmonary consult while pending bed at [facility]. Have consulted PCCM and discussed with Dr. Elsworth Soho  DVT prophylaxis: Eliquis Code Status: Full Family Communication: Pt in room, family is at bedside  Status is: Inpatient  Remains inpatient appropriate because:Inpatient level of care appropriate due to severity of illness   Dispo: The patient is from: Home              Anticipated d/c is to: Home              Patient currently is not medically stable to d/c.   Difficult to place patient No       Consultants:  Procedures:     Antimicrobials: Anti-infectives (From admission, onward)   Start     Dose/Rate Route Frequency Ordered Stop   01/30/21 2000  vancomycin (VANCOREADY) IVPB 1250 mg/250 mL        1,250 mg 166.7 mL/hr over 90 Minutes Intravenous Every 24 hours 01/20/2021 2247     01/30/21 0400  ceFEPIme (MAXIPIME) 2 g in sodium chloride 0.9 % 100 mL IVPB        2 g 200 mL/hr over 30 Minutes Intravenous Every 8 hours 01/08/2021 2247     01/06/2021 2219  valACYclovir  (VALTREX) tablet 500 mg       Note to Pharmacy: Patient taking differently: Cold sores     500 mg Oral Daily PRN 01/12/2021 2221     02/02/2021 1930  vancomycin (VANCOREADY) IVPB 1750 mg/350 mL        1,750 mg 175 mL/hr over 120 Minutes Intravenous  Once 01/28/2021 1846 01/31/2021 2205   01/08/2021 1900  ceFEPIme (MAXIPIME) 2 g in sodium chloride 0.9 % 100 mL IVPB        2 g 200 mL/hr over 30 Minutes Intravenous  Once 02/01/2021 1846 01/06/2021 2000   02/03/2021 1845  ceFEPIme (MAXIPIME) 1 g in sodium chloride 0.9 % 100 mL IVPB  Status:  Discontinued        1 g 200 mL/hr over 30 Minutes Intravenous  Once 01/26/2021 1832 01/31/2021 1845      Subjective: Complaining of increased sob  Objective: Vitals:   01/31/21 0820 01/31/21 0829 01/31/21 0900 01/31/21 1451  BP: (!) 85/61 93/78 101/68 98/77  Pulse: (!) 109 (!) 110 99 (!) 110  Resp: 20   18  Temp: 97.9 F (36.6 C)   98.4 F (36.9 C)  TempSrc: Oral   Oral  SpO2: 98% 97% 98% 99%  Height:        Intake/Output Summary (Last 24 hours) at 01/31/2021 1456 Last data filed at 01/31/2021 0805 Gross per 24 hour  Intake 454.25 ml  Output 1050 ml  Net -595.75 ml   There were no vitals filed for this visit.  Examination: General exam: Conversant, in no acute distress Respiratory system: increased resp effort, decreased BS Cardiovascular system: regular rhythm, s1-s2 Gastrointestinal system: Nondistended, nontender, pos BS Central nervous system: No seizures, no tremors Extremities: No cyanosis, no joint deformities Skin: No rashes, no pallor Psychiatry: Affect normal // no auditory hallucinations   Data Reviewed: I have personally reviewed following labs and imaging studies  CBC: Recent Labs  Lab 01/09/2021 1444 01/30/21 0441 01/31/21 0638  WBC 23.4* 26.7* 23.8*  NEUTROABS 20.0*  --   --   HGB 11.5* 13.7 12.0  HCT 38.0 45.5 38.5  MCV 90.9 91.4 88.3  PLT 642* 607* 893*   Basic Metabolic Panel: Recent Labs  Lab 01/14/2021 1444  01/30/21 0441 01/31/21 0638  NA 135 136 131*  K 4.3 5.0 5.3*  CL 90* 94* 94*  CO2 37* 33* 31  GLUCOSE 107* 101* 126*  BUN 7* 7* 11  CREATININE 0.40* 0.41* 0.40*  CALCIUM 8.3* 8.7* 8.4*   GFR: CrCl cannot be calculated (Unknown ideal weight.). Liver Function Tests: Recent Labs  Lab 02/03/2021 1444 01/30/21 0441 01/31/21 0638  AST 14* 15 14*  ALT 22 20 16   ALKPHOS 71 71 61  BILITOT 0.4 0.4 0.4  PROT 5.3* 5.7* 5.0*  ALBUMIN 2.0* 2.0* 1.8*   No results for input(s): LIPASE, AMYLASE in the last 168 hours. No results for input(s): AMMONIA  in the last 168 hours. Coagulation Profile: No results for input(s): INR, PROTIME in the last 168 hours. Cardiac Enzymes: No results for input(s): CKTOTAL, CKMB, CKMBINDEX, TROPONINI in the last 168 hours. BNP (last 3 results) No results for input(s): PROBNP in the last 8760 hours. HbA1C: No results for input(s): HGBA1C in the last 72 hours. CBG: No results for input(s): GLUCAP in the last 168 hours. Lipid Profile: No results for input(s): CHOL, HDL, LDLCALC, TRIG, CHOLHDL, LDLDIRECT in the last 72 hours. Thyroid Function Tests: No results for input(s): TSH, T4TOTAL, FREET4, T3FREE, THYROIDAB in the last 72 hours. Anemia Panel: No results for input(s): VITAMINB12, FOLATE, FERRITIN, TIBC, IRON, RETICCTPCT in the last 72 hours. Sepsis Labs: No results for input(s): PROCALCITON, LATICACIDVEN in the last 168 hours.  Recent Results (from the past 240 hour(s))  SARS CORONAVIRUS 2 (TAT 6-24 HRS) Nasopharyngeal Nasopharyngeal Swab     Status: None   Collection Time: 01/16/2021  7:43 PM   Specimen: Nasopharyngeal Swab  Result Value Ref Range Status   SARS Coronavirus 2 NEGATIVE NEGATIVE Final    Comment: (NOTE) SARS-CoV-2 target nucleic acids are NOT DETECTED.  The SARS-CoV-2 RNA is generally detectable in upper and lower respiratory specimens during the acute phase of infection. Negative results do not preclude SARS-CoV-2 infection, do not  rule out co-infections with other pathogens, and should not be used as the sole basis for treatment or other patient management decisions. Negative results must be combined with clinical observations, patient history, and epidemiological information. The expected result is Negative.  Fact Sheet for Patients: SugarRoll.be  Fact Sheet for Healthcare Providers: https://www.woods-mathews.com/  This test is not yet approved or cleared by the [place] FDA and  has been authorized for detection and/or diagnosis of SARS-CoV-2 by FDA under an Emergency Use Authorization (EUA). This EUA will remain  in effect (meaning this test can be used) for the duration of the COVID-19 declaration under Se ction 564(b)(1) of the Act, 21 U.S.C. section 360bbb-3(b)(1), unless the authorization is terminated or revoked sooner.  Performed at [facility], [place] [address] [place], [place] [address]   MRSA PCR Screening     Status: None   Collection Time: 01/30/21 12:53 PM   Specimen: Nasal Mucosa; Nasopharyngeal  Result Value Ref Range Status   MRSA by PCR NEGATIVE NEGATIVE Final    Comment:        The GeneXpert MRSA Assay (FDA approved for NASAL specimens only), is one component of a comprehensive MRSA colonization surveillance program. It is not intended to diagnose MRSA infection nor to guide or monitor treatment for MRSA infections. Performed at [facility], [place] [address], [place], [place] [address]      Radiology Studies: DG CHEST PORT 1 VIEW  Result Date: 01/31/2021 CLINICAL DATA:  Shortness of breath EXAM: PORTABLE CHEST 1 VIEW COMPARISON:  01/23/2021 FINDINGS: Moderate to large left pleural effusion. Small to moderate right pleural effusion. Left upper lobe/perihilar opacity, possibly reflecting pneumonia, although left upper lobe neoplasm is certainly possible. Bilateral lower lobe opacities, likely compressive  atelectasis. No pneumothorax. The heart is normal in size. Thoracic levoscoliosis. IMPRESSION: Left upper lobe/perihilar opacity, possibly reflecting pneumonia, although left upper lobe neoplasm is certainly possible. Moderate to large left pleural effusion. Small to moderate right pleural effusion. Bilateral lower lobe opacities, likely compressive atelectasis. Overall appearance is unchanged from recent chest radiographs. Electronically Signed   By: Julian Hy M.D.   On: 01/31/2021 13:48   DG Chest  Port 1 View  Result Date: 01/17/2021 CLINICAL DATA:  Shortness of breath. EXAM: PORTABLE CHEST 1 VIEW COMPARISON:  Most recent chest imaging radiograph 05/26/2020. CT 05/12/2020 FINDINGS: Rotated exam. Increased volume loss in the left hemithorax. Left pleural effusion and basilar opacity, increased from prior exam. This may be partially loculated and tracks laterally. Hazy right lung base opacity consistent with pleural effusion and associated atelectasis and or airspace disease. Drainage catheters project over the diaphragms, which may be PleurX catheters or upper abdominal drains. There are patchy left perihilar opacities. Heart size is obscured. No pneumothorax. Surgical clips in the right axilla. Scoliosis. IMPRESSION: 1. Left pleural effusion, increased from August 2021 exam. This may be partially loculated. There is left perihilar airspace disease. 2. Hazy right lung base opacity consistent with pleural effusion and associated atelectasis and or airspace disease. 3. Bilateral drainage catheters project over the diaphragms, may be PleurX catheters or upper abdominal drains. Recommend correlation with procedural history. Electronically Signed   By: Keith Rake M.D.   On: 01/14/2021 15:35    Scheduled Meds: . ALPRAZolam  1.5 mg Oral QHS  . apixaban  5 mg Oral BID  . lactose free nutrition  237 mL Oral TID WC  . mouth rinse  15 mL Mouth Rinse BID  . multivitamin with minerals  1 tablet Oral  Daily  . pramipexole  1 mg Oral QHS   Continuous Infusions: . ceFEPime (MAXIPIME) IV 2 g (01/31/21 1444)  . vancomycin 1,250 mg (01/30/21 2054)     LOS: 2 days   Marylu Lund, MD [facility] Pager On Amion  If 7PM-7AM, please contact night-coverage 01/31/2021, 2:56 PM

## 2021-01-31 NOTE — Consult Note (Signed)
Name: Elizabeth [name] MRN: [id number] DOB: 1951-01-20    ADMISSION DATE:  01/18/2021 CONSULTATION DATE:  01/31/2021   REFERRING MD :  Wyline Copas , triad   CHIEF COMPLAINT:  pleural effusions, increased oxygen requirements  BRIEF PATIENT DESCRIPTION: 70 year old with metastatic breast cancer and bilateral malignant effusion status post bilateral Pleurx catheter.  We are consulted for increasing hypoxia and effusions in spite of drainage  SIGNIFICANT EVENTS  4/26 bilateral Pleurx catheter drainage  STUDIES:  CTA chest 4/26 >> Large bilateral pleural effusions, increased since the prior CT 05/12/2020. There is mild nodular enhancement of the pleural surfaces. Extensive consolidative changes of the left lung and complete consolidation of the right lower lobe.   HISTORY OF PRESENT ILLNESS:-year-old woman with metastatic breast cancer with bilateral malignant effusions status post bilateral Pleurx catheters.  She gets her care at [facility] oncology, she has chronic hypoxic respiratory failure and is maintained on 6 L oxygen at home.  She is admitted 4/24 with increased shortness of breath and 1 episode of rectal bleeding.  She has a prior history of DVT and pulm embolism and is maintained on apixaban.  She undergoes drainage of Pleurx catheter alternate days on each side. Her initial evaluation showed leukocytosis, BNP 52, bilateral pleural effusions on chest x-ray.  CT chest confirmed bilateral effusions and showed nodular enhancement of the pleural surfaces.  There were also changes of consolidation in the left lung in the right lower lobe.  She was started on empiric antibiotics .  Her primary oncology team was contacted at [facility] and plan initially was for transfer to [facility] but since beds are not available, we are consulted to assist with her care. I note that the left Pleurx was drained 4/25-500 cc and right was drained 4/26 -700 cc  PAST MEDICAL HISTORY :   has a past medical history of  Anginal pain ([place]), Anxiety, Breast cancer, right ([place]) (10/6008), Complication of anesthesia, Constipation due to opioid therapy, Degenerative joint disease (DJD) of hip, GERD (gastroesophageal reflux disease), Headache, Osteopenia, Pneumonia, Restless leg syndrome, Scoliosis, SUI (stress urinary incontinence, female), and Vaso-vagal reaction.  has a past surgical history that includes Back surgery; Laminectomy (2006); Tonsillectomy; Appendectomy (1963); Breast surgery; Eye surgery; Rotator cuff repair (Bilateral); Hysterotomy; Tubal ligation; Foot surgery (Bilateral); Total hip arthroplasty (Right, 01/18/2015); Breast cyst aspiration (Right, 1988); Cataract extraction w/PHACO (Right, 03/31/2018); Cataract extraction w/PHACO (Left, 04/16/2018); Breast excisional biopsy (Right, 1990); Breast biopsy (Right, 04/27/2019); Breast lumpectomy (Right, 05/08/2019); Breast lumpectomy with sentinel lymph node bx (Right, 05/08/2019); Video bronchoscopy with endobronchial ultrasound (N/A, 12/22/2019); and Fine needle aspiration (12/22/2019). Prior to Admission medications   Medication Sig Start Date End Date Taking? Authorizing Provider  albuterol (VENTOLIN HFA) 108 (90 Base) MCG/ACT inhaler Inhale 2 puffs into the lungs every 6 (six) hours as needed for wheezing or shortness of breath. 05/16/20  Yes Ghimire, Dante Gang, MD  ALPRAZolam Duanne Moron) 0.5 MG tablet TAKE 3 TABLETS BY MOUTH AT BEDTIME Patient taking differently: Take 1.5 mg by mouth at bedtime. 12/25/20  Yes Birdie Sons, MD  Cyanocobalamin (VITAMIN B 12 PO) Take 1 tablet by mouth daily.   Yes [provider]  ELIQUIS 5 MG TABS tablet TAKE 1 TABLET BY MOUTH TWICE A DAY Patient taking differently: Take 5 mg by mouth 2 (two) times daily. 01/27/21  Yes Birdie Sons, MD  lidocaine-prilocaine (EMLA) cream Apply to affected area once Patient taking differently: Apply 1 application topically daily as needed (prior to port being accessed.). 05/25/20  Yes  Lequita Asal, MD  Multiple Vitamin (MULTIVITAMIN WITH MINERALS) TABS tablet Take 1 tablet by mouth daily.   Yes [provider]  nitroGLYCERIN (NITROSTAT) 0.4 MG SL tablet Place 1 tablet (0.4 mg total) under the tongue every 5 (five) minutes as needed for chest pain. Go to ER if third tablet is necessary Patient taking differently: Place 0.4 mg under the tongue every 5 (five) minutes x 3 doses as needed for chest pain. Go to ER if third tablet is necessary 05/03/20  Yes Fisher, Kirstie Peri, MD  ondansetron (ZOFRAN-ODT) 8 MG disintegrating tablet Take 8 mg by mouth every 8 (eight) hours as needed for nausea. 08/09/20  Yes [provider]  pramipexole (MIRAPEX) 1 MG tablet Take 1 tablet (1 mg total) by mouth at bedtime. 01/27/21 02/21/22 Yes Birdie Sons, MD  senna-docusate (SENOKOT-S) 8.6-50 MG tablet Take 1 tablet by mouth daily as needed for mild constipation. 06/03/20  Yes [provider]  valACYclovir (VALTREX) 500 MG tablet Take 1 tablet (500 mg total) by mouth daily as needed (Cold sores). Patient taking differently: Take 500 mg by mouth daily as needed (Cold sores). Cold sores 12/22/19  Yes Byrum, Rose Fillers, MD  dexamethasone (DECADRON) 4 MG tablet Take 1 tab two times a day the day before [place] chemo, then take 2 tabs once a day for 3 days starting the day after chemo. Patient not taking: Reported on 01/10/2021 05/25/20   Lequita Asal, MD  famotidine (PEPCID) 20 MG tablet One after supper Patient not taking: Reported on 01/31/2021 04/18/20   Tanda Rockers, MD  ivabradine (CORLANOR) 5 MG TABS tablet Take 2 hours prior to cardiac CT along with metoprolol (100mg  tablet) Patient not taking: No sig reported 05/12/20   Kate Sable, MD  metoprolol tartrate (LOPRESSOR) 100 MG tablet Take 1 tablet (100 mg total) by mouth once for 1 dose. Take 2 hours prior to your CT scan. Patient not taking: No sig reported 05/12/20 05/12/20  Kate Sable, MD  oxyCODONE-acetaminophen  (PERCOCET/ROXICET) 5-325 MG tablet Take 1 tablet by mouth every 6 (six) hours as needed for severe pain. Patient not taking: Reported on 01/09/2021 05/25/20   Lequita Asal, MD  pantoprazole (PROTONIX) 40 MG tablet Take 1 tablet (40 mg total) by mouth daily. Take 30-60 min before first meal of the day Patient not taking: Reported on 02/01/2021 04/18/20   Tanda Rockers, MD  tamoxifen (NOLVADEX) 20 MG tablet TAKE 1 TABLET BY MOUTH ONCE DAILY Patient not taking: No sig reported 08/25/20   Lequita Asal, MD   Allergies  Allergen Reactions  . Zostavax [Zoster Vaccine Live] Rash  . Penicillins Hives    Did it involve swelling of the face/tongue/throat, SOB, or low BP? No Did it involve sudden or severe rash/hives, skin peeling, or any reaction on the inside of your mouth or nose? Yes Did you need to seek medical attention at a hospital or doctor's office? Yes When did it last happen?14 or 15 If all above answers are "NO", may proceed with cephalosporin use.   . Naproxen Hives  . Zoloft [Sertraline Hcl] Hives  . Latex Itching and Rash    Condoms    FAMILY HISTORY:  family history includes Alcohol abuse in her brother and mother; Breast cancer (age of onset: 24) in her daughter; Cancer in her brother and daughter; Cancer (age of onset: 85) in her mother; Cancer (age of onset: 69) in her father; Diabetes in her father; Migraines  in her mother. SOCIAL HISTORY:  reports that she quit smoking about 9 years ago. Her smoking use included cigarettes. She has a 10.00 pack-year smoking history. She has never used smokeless tobacco. She reports current alcohol use of about 5.0 standard drinks of alcohol per week. She reports that she does not use drugs.  REVIEW OF SYSTEMS:   Complains of whole body pain and 1 episode of blood in stool  Constitutional: Negative for fever, chills, weight loss, malaise/fatigue and diaphoresis.  HENT: Negative for hearing loss, ear pain, nosebleeds,  congestion, sore throat, neck pain, tinnitus and ear discharge.   Eyes: Negative for blurred vision, double vision, photophobia, pain, discharge and redness.  Respiratory: Negative for cough, hemoptysis, sputum production, wheezing and stridor.   Cardiovascular: Negative for chest pain, palpitations, orthopnea, claudication, leg swelling and PND.  Gastrointestinal: Negative for heartburn, nausea, vomiting, diarrhea, constipation and melena.  Genitourinary: Negative for dysuria, urgency, frequency, hematuria and flank pain.  Musculoskeletal: Negative for myalgias, back pain, joint pain and falls.  Skin: Negative for itching and rash.  Neurological: Negative for dizziness, tingling, tremors, sensory change, speech change, focal weakness, seizures, loss of consciousness, weakness and headaches.  Endo/Heme/Allergies: Negative for environmental allergies and polydipsia. Does not bruise/bleed easily.  SUBJECTIVE:   VITAL SIGNS: Temp:  [97.6 F (36.4 C)-98.4 F (36.9 C)] 98.4 F (36.9 C) (04/26 1451) Pulse Rate:  [99-125] 110 (04/26 1451) Resp:  [17-20] 18 (04/26 1451) BP: (85-128)/(61-95) 98/77 (04/26 1451) SpO2:  [94 %-99 %] 99 % (04/26 1451)  PHYSICAL EXAMINATION: General: Elderly woman, appears frail and deconditioned, on 7 L nasal cannula Neuro: Awake and interactive, appears weak, nonfocal HEENT: No JVD, no pallor or icterus Cardiovascular: S1-S2 regular Lungs: Decreased breath sounds bilateral Abdomen: Soft, nontender, colostomy appears pink Musculoskeletal: 1+ edema, no deformity Skin: Purple toes on both feet, cold extremities  Recent Labs  Lab 01/20/2021 1444 01/30/21 0441 01/31/21 0638  NA 135 136 131*  K 4.3 5.0 5.3*  CL 90* 94* 94*  CO2 37* 33* 31  BUN 7* 7* 11  CREATININE 0.40* 0.41* 0.40*  GLUCOSE 107* 101* 126*   Recent Labs  Lab 01/13/2021 1444 01/30/21 0441 01/31/21 0638  HGB 11.5* 13.7 12.0  HCT 38.0 45.5 38.5  WBC 23.4* 26.7* 23.8*  PLT 642* 607* 641*    CT CHEST W CONTRAST  Result Date: 01/31/2021 CLINICAL DATA:  70 year old female with pneumonia. EXAM: CT CHEST WITH CONTRAST TECHNIQUE: Multidetector CT imaging of the chest was performed during intravenous contrast administration. CONTRAST:  29mL OMNIPAQUE IOHEXOL 300 MG/ML  SOLN COMPARISON:  Chest CT dated 05/12/2020 and radiograph dated 01/31/2021. FINDINGS: Cardiovascular: There is no cardiomegaly or pericardial effusion. The thoracic aorta is unremarkable. The origins of the great vessels of the aortic arch appear patent. No pulmonary artery embolus identified. Mediastinum/Nodes: No hilar adenopathy noted. Evaluation however is very limited due to consolidative changes of the adjacent lungs. No mediastinal adenopathy. The esophagus is grossly unremarkable. No mediastinal fluid collection. Lungs/Pleura: Large bilateral pleural effusions, increased since the prior CT 05/12/2020. The right pleural effusion is new. There is complete consolidation of the right lower lobe with partial compressive atelectasis of the right middle lobe. There is consolidative changes of the majority of the left lung. There is apparent mild diffuse nodular enhancement of the pleural surfaces which may be seen in the setting of an infectious process, but is concerning for underlying malignancy. Further evaluation with cytology and culture analysis of the pleural effusion fluid recommended. Bilateral  chest tubes with tips in the left anterior pleural base and along the posterior pleural based on the right. There is diffuse interstitial and interlobular septal prominence predominantly involving the left lung. There is no pneumothorax. The central airways remain patent. Upper Abdomen: Small left liver cyst. Musculoskeletal: Scoliosis and degenerative changes of the spine. No acute osseous pathology. IMPRESSION: 1. No central pulmonary artery embolus. 2. Large bilateral pleural effusions, increased since the prior CT 05/12/2020. There  is mild nodular enhancement of the pleural surfaces which may be related to an infectious process/empyema or malignancy. Further evaluation with culture and cytology analysis of the pleural fluid is recommended. 3. Extensive consolidative changes of the left lung and complete consolidation of the right lower lobe. Electronically Signed   By: Anner Crete M.D.   On: 01/31/2021 16:21   DG CHEST PORT 1 VIEW  Result Date: 01/31/2021 CLINICAL DATA:  Shortness of breath EXAM: PORTABLE CHEST 1 VIEW COMPARISON:  01/27/2021 FINDINGS: Moderate to large left pleural effusion. Small to moderate right pleural effusion. Left upper lobe/perihilar opacity, possibly reflecting pneumonia, although left upper lobe neoplasm is certainly possible. Bilateral lower lobe opacities, likely compressive atelectasis. No pneumothorax. The heart is normal in size. Thoracic levoscoliosis. IMPRESSION: Left upper lobe/perihilar opacity, possibly reflecting pneumonia, although left upper lobe neoplasm is certainly possible. Moderate to large left pleural effusion. Small to moderate right pleural effusion. Bilateral lower lobe opacities, likely compressive atelectasis. Overall appearance is unchanged from recent chest radiographs. Electronically Signed   By: Julian Hy M.D.   On: 01/31/2021 13:48    ASSESSMENT / PLAN:  Bilateral malignant effusions Chronic hypoxic respiratory failure  -She does not seem to have underlying lung disease, note normal spirometry 03/2017 -Nodular enhancement of pleura suggests pleural involvement with metastases   Recommend -although normally we prefer not to drain both sides on the same day, it seems that she has significant residual effusion on both sides in spite of drainage on 4/25 and 4/26. On 4/27, would recommend drain right side first in the morning and can drain left side in the evening Would follow-up chest x-ray on 4/28 and recommend further drainage. Try to maximize drainage at  each session  H CAP -not convinced but she had leukocytosis so okay to treat with broad-spectrum antibiotics, this appears to be more compressive atelectasis due to effusions  Peripheral arterial disease -defer to hospitalist to further investigate with arterial Doppler, she is on Eliquis already  Kara Mead MD. FCCP. [facility] Pager : [phone]  If no response to pager , please call [phone] until 7 pm After 7:00 pm call Elink  [phone]     01/31/2021, 4:35 PM

## 2021-02-01 ENCOUNTER — Inpatient Hospital Stay (HOSPITAL_COMMUNITY): Payer: Medicare Other

## 2021-02-01 DIAGNOSIS — I739 Peripheral vascular disease, unspecified: Secondary | ICD-10-CM | POA: Diagnosis not present

## 2021-02-01 DIAGNOSIS — J9601 Acute respiratory failure with hypoxia: Secondary | ICD-10-CM

## 2021-02-01 DIAGNOSIS — J910 Malignant pleural effusion: Secondary | ICD-10-CM | POA: Diagnosis not present

## 2021-02-01 DIAGNOSIS — J9602 Acute respiratory failure with hypercapnia: Secondary | ICD-10-CM | POA: Diagnosis not present

## 2021-02-01 DIAGNOSIS — E872 Acidosis: Secondary | ICD-10-CM

## 2021-02-01 DIAGNOSIS — J189 Pneumonia, unspecified organism: Secondary | ICD-10-CM | POA: Diagnosis not present

## 2021-02-01 DIAGNOSIS — C801 Malignant (primary) neoplasm, unspecified: Secondary | ICD-10-CM

## 2021-02-01 DIAGNOSIS — J9811 Atelectasis: Secondary | ICD-10-CM | POA: Diagnosis not present

## 2021-02-01 DIAGNOSIS — L8990 Pressure ulcer of unspecified site, unspecified stage: Secondary | ICD-10-CM | POA: Insufficient documentation

## 2021-02-01 LAB — COMPREHENSIVE METABOLIC PANEL
ALT: 17 U/L (ref 0–44)
AST: 12 U/L — ABNORMAL LOW (ref 15–41)
Albumin: 2 g/dL — ABNORMAL LOW (ref 3.5–5.0)
Alkaline Phosphatase: 66 U/L (ref 38–126)
Anion gap: 8 (ref 5–15)
BUN: 11 mg/dL (ref 8–23)
CO2: 34 mmol/L — ABNORMAL HIGH (ref 22–32)
Calcium: 8.7 mg/dL — ABNORMAL LOW (ref 8.9–10.3)
Chloride: 92 mmol/L — ABNORMAL LOW (ref 98–111)
Creatinine, Ser: 0.43 mg/dL — ABNORMAL LOW (ref 0.44–1.00)
GFR, Estimated: >60 mL/min (ref >60)
Glucose, Bld: 164 mg/dL — ABNORMAL HIGH (ref 70–99)
Potassium: 4.6 mmol/L (ref 3.5–5.1)
Sodium: 134 mmol/L — ABNORMAL LOW (ref 135–145)
Total Bilirubin: 0.4 mg/dL (ref 0.3–1.2)
Total Protein: 5.4 g/dL — ABNORMAL LOW (ref 6.5–8.1)

## 2021-02-01 LAB — CBC
HCT: 42.5 % (ref 36.0–46.0)
Hemoglobin: 12.6 g/dL (ref 12.0–15.0)
MCH: 27.6 pg (ref 26.0–34.0)
MCHC: 29.6 g/dL — ABNORMAL LOW (ref 30.0–36.0)
MCV: 93 fL (ref 80.0–100.0)
Platelets: 764 10*3/uL — ABNORMAL HIGH (ref 150–400)
RBC: 4.57 MIL/uL (ref 3.87–5.11)
RDW: 16.8 % — ABNORMAL HIGH (ref 11.5–15.5)
WBC: 25.1 10*3/uL — ABNORMAL HIGH (ref 4.0–10.5)
nRBC: 0 % (ref 0.0–0.2)

## 2021-02-01 LAB — LACTIC ACID, PLASMA
Lactic Acid, Venous: 3 mmol/L (ref 0.5–1.9)
Lactic Acid, Venous: 2.5 mmol/L (ref 0.5–1.9)

## 2021-02-01 LAB — BLOOD GAS, ARTERIAL
Acid-Base Excess: 6.3 mmol/L — ABNORMAL HIGH (ref 0.0–2.0)
Acid-Base Excess: 4.7 mmol/L — ABNORMAL HIGH (ref 0.0–2.0)
Acid-Base Excess: 5.4 mmol/L — ABNORMAL HIGH (ref 0.0–2.0)
Bicarbonate: 35.2 mmol/L — ABNORMAL HIGH (ref 20.0–28.0)
Bicarbonate: 36.4 mmol/L — ABNORMAL HIGH (ref 20.0–28.0)
Bicarbonate: 31.8 mmol/L — ABNORMAL HIGH (ref 20.0–28.0)
Delivery systems: POSITIVE
Drawn by: 29503
Expiratory PAP: 8
FIO2: 60
FIO2: 100
FIO2: 100
Inspiratory PAP: 22
MECHVT: 400 mL
O2 Content: 10 L/min
O2 Saturation: 85.5 %
O2 Saturation: 82.1 %
O2 Saturation: 60.3 %
PEEP: 8 cmH2O
Patient temperature: 98.6
Patient temperature: 98.6
Patient temperature: 98.6
RATE: 14 resp/min
RATE: 24 resp/min
pCO2 arterial: 77.5 mmHg (ref 32.0–48.0)
pCO2 arterial: 98.2 mmHg (ref 32.0–48.0)
pCO2 arterial: 56.9 mmHg — ABNORMAL HIGH (ref 32.0–48.0)
pH, Arterial: 7.28 — ABNORMAL LOW (ref 7.350–7.450)
pH, Arterial: 7.194 — CL (ref 7.350–7.450)
pH, Arterial: 7.365 (ref 7.350–7.450)
pO2, Arterial: 57.3 mmHg — ABNORMAL LOW (ref 83.0–108.0)
pO2, Arterial: 56.8 mmHg — ABNORMAL LOW (ref 83.0–108.0)
pO2, Arterial: 31.8 mmHg — CL (ref 83.0–108.0)

## 2021-02-01 LAB — GLUCOSE, CAPILLARY
Glucose-Capillary: 136 mg/dL — ABNORMAL HIGH (ref 70–99)
Glucose-Capillary: 160 mg/dL — ABNORMAL HIGH (ref 70–99)
Glucose-Capillary: 139 mg/dL — ABNORMAL HIGH (ref 70–99)
Glucose-Capillary: 159 mg/dL — ABNORMAL HIGH (ref 70–99)

## 2021-02-01 MED ORDER — ORAL CARE MOUTH RINSE
15.0000 mL | OROMUCOSAL | Status: DC
  Administered 2021-02-01 – 2021-02-03 (×20): 15 mL via OROMUCOSAL

## 2021-02-01 MED ORDER — FENTANYL CITRATE (PF) 100 MCG/2ML IJ SOLN
25.0000 ug | INTRAMUSCULAR | Status: DC | PRN
Start: 2021-02-01 — End: 2021-02-03
  Administered 2021-02-01 (×2): 50 ug via INTRAVENOUS
  Administered 2021-02-01: 25 ug via INTRAVENOUS
  Administered 2021-02-02: 100 ug via INTRAVENOUS
  Administered 2021-02-02: 50 ug via INTRAVENOUS
  Filled 2021-02-01 (×2): qty 2

## 2021-02-01 MED ORDER — ROCURONIUM BROMIDE 10 MG/ML (PF) SYRINGE
PREFILLED_SYRINGE | INTRAVENOUS | Status: AC
  Filled 2021-02-01: qty 10

## 2021-02-01 MED ORDER — POLYETHYLENE GLYCOL 3350 17 G PO PACK
17.0000 g | PACK | Freq: Every day | ORAL | Status: DC
  Administered 2021-02-01 – 2021-02-02 (×2): 17 g
  Filled 2021-02-01 (×2): qty 1

## 2021-02-01 MED ORDER — ETOMIDATE 2 MG/ML IV SOLN: 20.0000 mg | Freq: Once | INTRAVENOUS | Status: DC

## 2021-02-01 MED ORDER — ETOMIDATE 2 MG/ML IV SOLN
20.0000 mg | Freq: Once | INTRAVENOUS | Status: AC
  Administered 2021-02-01: 20 mg via INTRAVENOUS

## 2021-02-01 MED ORDER — CHLORHEXIDINE GLUCONATE 0.12% ORAL RINSE (MEDLINE KIT)
15.0000 mL | Freq: Two times a day (BID) | OROMUCOSAL | Status: DC
  Administered 2021-02-01 – 2021-02-04 (×6): 15 mL via OROMUCOSAL

## 2021-02-01 MED ORDER — LACTATED RINGERS IV BOLUS
500.0000 mL | Freq: Once | INTRAVENOUS | Status: AC
  Administered 2021-02-01: 500 mL via INTRAVENOUS

## 2021-02-01 MED ORDER — ETOMIDATE 2 MG/ML IV SOLN
INTRAVENOUS | Status: AC
  Filled 2021-02-01: qty 10

## 2021-02-01 MED ORDER — FENTANYL CITRATE (PF) 100 MCG/2ML IJ SOLN
25.0000 ug | INTRAMUSCULAR | Status: DC | PRN
  Administered 2021-02-01: 25 ug via INTRAVENOUS
  Filled 2021-02-01 (×3): qty 2

## 2021-02-01 MED ORDER — ROCURONIUM BROMIDE 50 MG/5ML IV SOLN: 60.0000 mg | Freq: Once | INTRAVENOUS | Status: DC

## 2021-02-01 MED ORDER — APIXABAN 5 MG PO TABS
5.0000 mg | ORAL_TABLET | Freq: Two times a day (BID) | ORAL | Status: DC
  Administered 2021-02-01 – 2021-02-02 (×4): 5 mg
  Filled 2021-02-01 (×5): qty 1

## 2021-02-01 MED ORDER — NOREPINEPHRINE 4 MG/250ML-% IV SOLN
0.0000 ug/min | INTRAVENOUS | Status: DC
  Administered 2021-02-01: 16 ug/min via INTRAVENOUS
  Administered 2021-02-01: 4 ug/min via INTRAVENOUS
  Administered 2021-02-01: 16 ug/min via INTRAVENOUS
  Administered 2021-02-02: 15 ug/min via INTRAVENOUS
  Administered 2021-02-02: 17 ug/min via INTRAVENOUS
  Administered 2021-02-02: 12 ug/min via INTRAVENOUS
  Administered 2021-02-02: 17 ug/min via INTRAVENOUS
  Administered 2021-02-02 – 2021-02-03 (×2): 16 ug/min via INTRAVENOUS
  Administered 2021-02-03: 12 ug/min via INTRAVENOUS
  Administered 2021-02-03 (×2): 20 ug/min via INTRAVENOUS
  Administered 2021-02-03: 5.3333 ug/min via INTRAVENOUS
  Filled 2021-02-01 (×12): qty 250

## 2021-02-01 MED ORDER — SODIUM CHLORIDE 3 % IN NEBU
4.0000 mL | INHALATION_SOLUTION | Freq: Two times a day (BID) | RESPIRATORY_TRACT | Status: DC
  Administered 2021-02-01 – 2021-02-02 (×3): 4 mL via RESPIRATORY_TRACT
  Filled 2021-02-01 (×4): qty 4

## 2021-02-01 MED ORDER — ALPRAZOLAM 0.5 MG PO TABS
0.5000 mg | ORAL_TABLET | Freq: Every day | ORAL | Status: DC
  Administered 2021-02-01 – 2021-02-02 (×2): 0.5 mg
  Filled 2021-02-01 (×2): qty 1

## 2021-02-01 MED ORDER — NOREPINEPHRINE 4 MG/250ML-% IV SOLN
2.0000 ug/min | INTRAVENOUS | Status: DC
  Administered 2021-02-01: 2 ug/min via INTRAVENOUS

## 2021-02-01 MED ORDER — CHLORHEXIDINE GLUCONATE CLOTH 2 % EX PADS
6.0000 | MEDICATED_PAD | Freq: Every day | CUTANEOUS | Status: DC
  Administered 2021-02-01 – 2021-02-03 (×3): 6 via TOPICAL

## 2021-02-01 MED ORDER — ROCURONIUM BROMIDE 10 MG/ML (PF) SYRINGE
60.0000 mg | PREFILLED_SYRINGE | Freq: Once | INTRAVENOUS | Status: AC
  Administered 2021-02-01: 60 mg via INTRAVENOUS

## 2021-02-01 MED ORDER — NALOXONE HCL 0.4 MG/ML IJ SOLN
0.4000 mg | INTRAMUSCULAR | Status: DC | PRN
  Administered 2021-02-01 (×2): 0.4 mg via INTRAVENOUS
  Filled 2021-02-01: qty 1

## 2021-02-01 MED ORDER — FENTANYL CITRATE (PF) 100 MCG/2ML IJ SOLN
INTRAMUSCULAR | Status: AC
  Filled 2021-02-01: qty 2

## 2021-02-01 MED ORDER — DOCUSATE SODIUM 50 MG/5ML PO LIQD
100.0000 mg | Freq: Two times a day (BID) | ORAL | Status: DC
  Administered 2021-02-01 – 2021-02-02 (×3): 100 mg
  Filled 2021-02-01 (×3): qty 10

## 2021-02-01 MED ORDER — SODIUM CHLORIDE 0.9 % IV SOLN
250.0000 mL | INTRAVENOUS | Status: DC
  Administered 2021-02-01 – 2021-02-03 (×2): 250 mL via INTRAVENOUS

## 2021-02-01 MED ORDER — PHENYLEPHRINE HCL-NACL 10-0.9 MG/250ML-% IV SOLN
0.0000 ug/min | INTRAVENOUS | Status: DC
  Administered 2021-02-01: 60 ug/min via INTRAVENOUS
  Filled 2021-02-01: qty 250

## 2021-02-01 MED ORDER — FENTANYL CITRATE (PF) 100 MCG/2ML IJ SOLN
50.0000 ug | Freq: Once | INTRAMUSCULAR | Status: AC
  Administered 2021-02-01: 25 ug via INTRAVENOUS

## 2021-02-01 MED ORDER — NOREPINEPHRINE 4 MG/250ML-% IV SOLN
INTRAVENOUS | Status: AC
  Filled 2021-02-01: qty 250

## 2021-02-01 MED ORDER — PHENYLEPHRINE HCL-NACL 10-0.9 MG/250ML-% IV SOLN
25.0000 ug/min | INTRAVENOUS | Status: DC
Start: 2021-02-01 — End: 2021-02-01
  Administered 2021-02-01: 25 ug/min via INTRAVENOUS
  Administered 2021-02-01 (×3): 200 ug/min via INTRAVENOUS
  Filled 2021-02-01 (×3): qty 250

## 2021-02-01 MED ORDER — ALBUTEROL SULFATE (2.5 MG/3ML) 0.083% IN NEBU: 2.5000 mg | INHALATION_SOLUTION | Freq: Four times a day (QID) | RESPIRATORY_TRACT | Status: DC | PRN

## 2021-02-01 MED ORDER — PHENYLEPHRINE HCL-NACL 10-0.9 MG/250ML-% IV SOLN
INTRAVENOUS | Status: AC
  Filled 2021-02-01: qty 250

## 2021-02-01 MED ORDER — PHENYLEPHRINE 40 MCG/ML (10ML) SYRINGE FOR IV PUSH (FOR BLOOD PRESSURE SUPPORT)
PREFILLED_SYRINGE | INTRAVENOUS | Status: AC
  Administered 2021-02-01: 80 ug
  Filled 2021-02-01: qty 10

## 2021-02-01 MED ORDER — PANTOPRAZOLE SODIUM 40 MG IV SOLR
40.0000 mg | Freq: Every day | INTRAVENOUS | Status: DC
  Administered 2021-02-01 – 2021-02-03 (×3): 40 mg via INTRAVENOUS
  Filled 2021-02-01 (×3): qty 40

## 2021-02-01 NOTE — Progress Notes (Signed)
Pt noted to have mental status change from beginning of shift.  Pt noted to have O2 in the 70s on 6L and require sternal rub to arouse this AM. O2 moved to 8L. Pt pulse between 90-130s. Pt noted to be arousable for a minute or so and then fall back asleep during linen change, with O2 in low 80s when sleeping. Pt unable to speak with labored breathing. Rapid called to assess. MD Hal Hope called to notify, ordered CBG, ABG. Pt O2 staying in 90s with 9L O2 at this time.

## 2021-02-01 NOTE — Progress Notes (Signed)
Chaplain engaged in an initial visit with Elizabeth [name], her husband Elizabeth [name]), brother and sister-in-law.  Chaplain was able to learn about Elizabeth [name] and provide support to [place] and the family as they grieved the major changes happening with Elizabeth [name].  Chaplain was able to help family process choosing to put Elizabeth [name] on a ventilator for the next 24 hours.  Elizabeth [name] was also grappling with understanding why someone as good, kind and loving as his wife would have to suffer the way she has.  Chaplain and family also did some theological processing.  Elizabeth [name] has noted that his wife has been fighting for the last two years and has been able to overcome some major battles.      Elizabeth [name] is the mother of three children.  Elizabeth [name] and Elizabeth [name] have been together over 20 years.  Elizabeth [name] describes Elizabeth [name] as just being a good person who would love and look out for others.  He noted that she has been through some very hard things in life but she has been able to overcome those challenges.    Chaplain offered listening, presence, and support throughout this time.      02/01/21 1200  Clinical Encounter Type  Visited With Patient and family together  Visit Type Initial;Spiritual support;Social support

## 2021-02-01 NOTE — Progress Notes (Signed)
ABI's have been completed. Preliminary results can be found in CV Proc through chart review.   02/01/21 10:16 AM Elizabeth [name] RVT

## 2021-02-01 NOTE — Procedures (Signed)
Intubation Procedure Note  Elizabeth [name]  [id number]  1951/03/20  Date:02/01/21  Time:10:00 AM   Provider Performing:Kaianna Dolezal D Rollene Rotunda    Procedure: Intubation (12248)  Indication(s) Respiratory Failure  Consent Risks of the procedure as well as the alternatives and risks of each were explained to the patient and/or caregiver.  Consent for the procedure was obtained and is signed in the bedside chart   Anesthesia Etomidate, Fentanyl and Rocuronium   Time Out Verified patient identification, verified procedure, site/side was marked, verified correct patient position, special equipment/implants available, medications/allergies/relevant history reviewed, required imaging and test results available.   Sterile Technique Usual hand hygeine, masks, and gloves were used   Procedure Description Patient positioned in bed supine.  Sedation given as noted above.  Patient was intubated with endotracheal tube using Glidescope.  View was Grade 1 full glottis .  Number of attempts was 1.  Colorimetric CO2 detector was consistent with tracheal placement.   Complications/Tolerance None; patient tolerated the procedure well. Chest X-ray is ordered to verify placement.   EBL Minimal   Specimen(s) None

## 2021-02-01 NOTE — Progress Notes (Addendum)
Central Line pulled back 5 mm due to incorrect position on CXR. There was blood return from proximal port to verify good position.

## 2021-02-01 NOTE — Procedures (Signed)
Bronchoscopy Procedure Note  Elizabeth [name]  [id number]  [birth year]  Date:02/01/21  Time:1:56 PM   Provider Performing:Crystal Scarberry V. Kordae Buonocore   Procedure(s):  Flexible bronchoscopy with bronchial alveolar lavage (65790)  Indication(s) LEFT atelectasis  Consent Risks of the procedure as well as the alternatives and risks of each were explained to the patient and/or caregiver.  Consent for the procedure was obtained and is signed in the bedside chart  Anesthesia Fentanyl 100 mcg in divided doses Already intubated for resp failure   Time Out Verified patient identification, verified procedure, site/side was marked, verified correct patient position, special equipment/implants available, medications/allergies/relevant history reviewed, required imaging and test results available.   Sterile Technique Usual hand hygiene, masks, gowns, and gloves were used   Procedure Description Bronchoscope advanced through endotracheal tube and into airway.  Airways were examined down to subsegmental level with findings noted below.   Following diagnostic evaluation, BAL(s) performed in 40cc with normal saline and return of 20cc fluid  Findings: Thick white mucoid secretions suctioned out of LEFT airways after lavage   Complications/Tolerance None; patient tolerated the procedure well. Chest X-ray is not needed post procedure.   EBL Minimal   Specimen(s) BAL for cx, fungal & cytology  Elizabeth Neuwirth V. Elsworth Soho MD

## 2021-02-01 NOTE — Progress Notes (Signed)
Patient noted to be in distress during bedside shift report. Patient was only responsive minimally to painful stimuli and oxygent saturation was 75-85% on 9L oxygen. Non-rebreather mask placed with oxygen saturation noted to be at 84-85%. Rapid response nurse called and Dr. Lonny Prude notified. Rapid response came to beside to evaluate and patient transferred off the floor for a higher level of care.

## 2021-02-01 NOTE — Progress Notes (Signed)
CPT started in supine position. RN does not want the patient turned on her side due to concerns for BP. Current BP 87/57 (65). Patient awake, following commands and tolerating well at this time. Repeat BP reading 139/93 (99).

## 2021-02-01 NOTE — Progress Notes (Addendum)
Pharmacy Antibiotic Note  Elizabeth [name] is a 70 y.o. female admitted on 01/15/2021 with SOB and rectal bleeding.  Pt has hx of metastatic breast cancer  Pharmacy has been consulted to dose vancomycin and cefepime for pneumonia.  Plan: Continue Cefepime 2g IV q8h Continue Vancomycin 1250mg  IV q24h. (SCr rounded to 0.8, expected AUC 469) Measure Vanc peak and trough as needed.  Goal AUC = 400 - 550. Follow up renal function, culture results, and clinical course.   Height: 5\' 2"  (157.5 cm) IBW/kg (Calculated) : 50.1  Temp (24hrs), Avg:97.6 F (36.4 C), Min:96.6 F (35.9 C), Max:98.4 F (36.9 C)  Recent Labs  Lab 01/17/2021 1444 01/30/21 0441 01/31/21 0638 02/01/21 0604  WBC 23.4* 26.7* 23.8* 25.1*  CREATININE 0.40* 0.41* 0.40* 0.43*    CrCl cannot be calculated (Unknown ideal weight.).    Allergies  Allergen Reactions  . Zostavax [Zoster Vaccine Live] Rash  . Penicillins Hives    Did it involve swelling of the face/tongue/throat, SOB, or low BP? No Did it involve sudden or severe rash/hives, skin peeling, or any reaction on the inside of your mouth or nose? Yes Did you need to seek medical attention at a hospital or doctor's office? Yes When did it last happen?14 or 15 If all above answers are "NO", may proceed with cephalosporin use.   . Naproxen Hives  . Zoloft [Sertraline Hcl] Hives  . Latex Itching and Rash    Condoms    Antimicrobials this admission: 4/24 vanc >> 4/24 cefepime >>  Dose adjustments this admission:  Microbiology results: 4/24 COVID: neg 4/25 MRSA PCR: neg 4/26 BCx: ngtd 4/27 Resp cxt:   Thank you for allowing pharmacy to be a part of this patient's care.  Gretta Arab PharmD, BCPS Clinical Pharmacist [facility] main pharmacy [phone] 02/01/2021 8:05 AM    Addendum:  Per Dr Elsworth Soho, D/c vancomycin with negative MRSA PCR (unlikely MRSA Pneumonia) and continue Cefepime x7 days  Gretta Arab PharmD, BCPS Clinical  Pharmacist [facility] main pharmacy [phone] 02/01/2021 11:31 AM

## 2021-02-01 NOTE — Significant Event (Signed)
Rapid Response Event Note   Reason for Call :  Change in responsiveness on morning rounds per bedside RN. Patient was given Dilaudid during the night and bedside RN thinks patient is oversedated. Patient also needing oxygen 9L/min per bedside RN. Baseline oxygen liter flow this shift was 7L/min via [place]. Writer requested [facility] / house coverage RN check patient and instructed bedside RN to page MD on call.  [facility] Edwinna Areola coverage RN came to Probation officer on another rapid response stating the patient was more alert, but would still want Probation officer to see her. Also informed that Narcan was given.  Initial Focused Assessment:  On call MD assessed patient at bedside and stated he felt patient had improved. When I arrived to unit, bedside RN with patient who is alert, verbalizing she feels better, and has stable vital signs.     Interventions:  Reportedly, Narcan given, but not seen on MAR. Vitals assessed and MD (Dr. Hal Hope) at bedside before RR RN arrived.   Plan of Care:   Monitor condition and vital signs. If deteriorates, call Rapid RN again. Dr. Hal Hope at bedside and agrees with this plan.  Event Summary:   MD Notified: 0600 Call Time: 7915 Arrival Time: [place] End Time: [place], RN

## 2021-02-01 NOTE — Significant Event (Signed)
Rapid Response Event Note   Reason for Call : lethargy, increased oxygen demand   Initial Focused Assessment:  Patient labored breathing, PH 7.280; CO2 77.5, O2 57.3    Interventions:  Called MD to transfer to ICU  Plan of Care: transfer to ICU pending intubation, starting bipap therapy     Event Summary:   MD NotifiedLonny Prude @ [facility]; CCM notified 0730 Call Time: 0721 Arrival Time: 5573 End Time: 0745  Clarene Critchley, RN

## 2021-02-01 NOTE — Progress Notes (Signed)
Pt noted to have O2 sats 85 on 9L during shift report. Remained 85 with nonrebreather. Rapid notified again. Pt transferred off floor.

## 2021-02-01 NOTE — Progress Notes (Signed)
Critical PCO2 77.5. [place] notified via page. Awaiting response.   [facility]

## 2021-02-01 NOTE — Progress Notes (Signed)
Name: Elizabeth [name] MRN: [id number] DOB: 1950-11-27    ADMISSION DATE:  01/14/2021 CONSULTATION DATE:  02/01/2021   REFERRING MD :  Wyline Copas , [facility]   CHIEF COMPLAINT:  pleural effusions, increased oxygen requirements  BRIEF PATIENT DESCRIPTION: 70 year old with metastatic breast cancer and bilateral malignant effusion status post bilateral Pleurx catheter.  We are consulted for increasing hypoxia and effusions in spite of drainage    PAST MEDICAL HISTORY :   has a past medical history of Anginal pain ([place]), Anxiety, Breast cancer, right ([place]) (46/5035), Complication of anesthesia, Constipation due to opioid therapy, Degenerative joint disease (DJD) of hip, GERD (gastroesophageal reflux disease), Headache, Osteopenia, Pneumonia, Restless leg syndrome, Scoliosis, SUI (stress urinary incontinence, female), and Vaso-vagal reaction.  has a past surgical history that includes Back surgery; Laminectomy (2006); Tonsillectomy; Appendectomy (1963); Breast surgery; Eye surgery; Rotator cuff repair (Bilateral); Hysterotomy; Tubal ligation; Foot surgery (Bilateral); Total hip arthroplasty (Right, 01/18/2015); Breast cyst aspiration (Right, 1988); Cataract extraction w/PHACO (Right, 03/31/2018); Cataract extraction w/PHACO (Left, 04/16/2018); Breast excisional biopsy (Right, 1990); Breast biopsy (Right, 04/27/2019); Breast lumpectomy (Right, 05/08/2019); Breast lumpectomy with sentinel lymph node bx (Right, 05/08/2019); Video bronchoscopy with endobronchial ultrasound (N/A, 12/22/2019); and Fine needle aspiration (12/22/2019).  SIGNIFICANT EVENTS  4/24 admitted. Vanc and cefepime started. Blood cultures sent  4/26 PCCM consulted bilateral Pleurx catheter drainage CTA chest 4/26 >> Large bilateral pleural effusions, increased since the prior CT 05/12/2020. There is mild nodular enhancement of the pleural surfaces. Extensive consolidative changes of the left lung and complete consolidation of the right  lower lobe. We recommend increased frequency of chest drainage. Also noted to have cold LEs and be mottled we recommended LE dopplers. Rapid response called during evening for decreased LOC. Got Narcan and improved 4/27: moved to ICU. Unresponsive. ABG 7.28,78, 57 and 35 sats 86%. Gluc 160, tachycardic, placed on BIPAP, right chest drained for about 800 ml, cultures sent.  given 2 doses of Narcan, 500 LR, family notified of clinical decline. Post intervention still lethargic.    SUBJECTIVE:  Minimally responsive  VITAL SIGNS: Temp:  [96.6 F (35.9 C)-98.4 F (36.9 C)] 96.6 F (35.9 C) (04/27 0556) Pulse Rate:  [70-129] 129 (04/27 0556) Resp:  [18-22] 20 (04/27 0556) BP: (85-117)/(61-87) 101/86 (04/27 0556) SpO2:  [90 %-99 %] 92 % (04/27 0556)  PHYSICAL EXAMINATION: General this is a 70 year old acute on chronically ill WF who is lethargic and minimally responsive in spite of narcan x2 and bipap HENT pupils equal and reactive. MM dry. No JVD Pulm + accessory use RR 30s, decreased R>L w/ poor VTs on BIPAP Card RRR ST 120s Ext cool, mottled, BLE edema, not able to palp pulses doppler  abd soft, hypoactive. Has colostomy stoma. Pink beefy. Some eschar tissue noted.  Neuro minimally responsive. Does follow commands w/ LEs (wiggle toes squeezes hands) gu due to void.   Impression/plan Acute on chronic hypoxic and acute hypercarbic respiratory failure. Seems multifactorial. Atelectasis, possible PNA, bilateral malignant effusions, also consider narcotics but did not really respond to narcan pcxr personally reviewed. Improved aeration on right w/ basilar vol loss. This is immediately post drainage. Left w/ re-accumulation of loculated effusion as well as basilar vol loss.  Plan Try BIPAP Drained right pleur-x. Send for culture Repeat pcxr post-drain Repeat ABG Really needs to be intubated but given her poor prognosis I don't think we will be helping her, will discuss w/ family  DNR/DNI  Severe sepsis. Source seems likely pulmonary. She is mottled and  has weak pulses.  Plan Ck lactic acid 500 ml LR abx day 3 cefepime and vanc  Acute metabolic encephalopathy. Did not respond to narcan. Suspect still hypercarbic. Not really getting much in the way of VT on BIPAP Plan Cont supportive care Dc all narcotics Discussing goals of care  Leukocytosis Plan abx Cultures as above Trend CBC  h/o  DVT involving RLE (about 1 yr ago) Plan Cont systemic AC  Peripheral arterial disease  Plan Cont systemic AC, may need to change to IV Will get LE dopplers.   DM w/ hyperglycemia Plan ssi   Stage IV breast cancer Plan Supportive care  Prior colon cancer  Plan Colostomy care   Best Practice (right click and "Reselect all SmartList Selections" daily)   Diet:  NPO If oral type of diet: NPO unless Mental status improved  Pain/Anxiety/Delirium protocol (if indicated): No VAP protocol (if indicated): Not indicated DVT prophylaxis: Other (comment) GI prophylaxis: N/A Glucose control:  SSI No Central venous access:  N/A Arterial line:  N/A Foley:  N/A Restraints ACTION; IS/IS NOT: is not Restraint type: not needed  Mobility:  bed rest  PT consulted: N/A Studies pending: none  Culture data pending: blood culture and pleural right  Last reviewed culture data:today Antibiotics: vancomycin and cefepime  Antibiotic de-escalation: Not indicated Stop date: does not have  TBD Daily labs: requested Code Status:  full code Prognosis: Terminal Last date of multidisciplinary goals of care discussion [pending ]  Disposition: Full Code  My cct 45 min this included: 1/3 time evaluating data, 1/3 time direct face to face and 1/3 time dictating and communicating plan w/ nursing staff  Erick Colace ACNP-BC [place] [place] Pager # [phone] OR # [phone] if no answer

## 2021-02-01 NOTE — Procedures (Signed)
Central Venous Catheter Insertion Procedure Note  Elizabeth [name]  [id number]  May 31, 1951  Date:02/01/21  Time:11:25 AM   Provider Performing:Even Budlong D Rollene Rotunda   Procedure: Insertion of Non-tunneled Central Venous [id number]) with US guidance (72902)   Indication(s) Medication administration  Consent Risks of the procedure as well as the alternatives and risks of each were explained to the patient and/or caregiver.  Consent for the procedure was obtained and is signed in the bedside chart  Anesthesia Topical only with 1% lidocaine   Timeout Verified patient identification, verified procedure, site/side was marked, verified correct patient position, special equipment/implants available, medications/allergies/relevant history reviewed, required imaging and test results available.  Sterile Technique Maximal sterile technique including full sterile barrier drape, hand hygiene, sterile gown, sterile gloves, mask, hair covering, sterile ultrasound probe cover (if used).  Procedure Description Area of catheter insertion was cleaned with chlorhexidine and draped in sterile fashion.  With real-time ultrasound guidance a central venous catheter was placed into the left internal jugular vein. Nonpulsatile blood flow and easy flushing noted in all ports.  The catheter was sutured in place and sterile dressing applied.  Complications/Tolerance None; patient tolerated the procedure well. Chest X-ray is ordered to verify placement for internal jugular or subclavian cannulation.   Chest x-ray is not ordered for femoral cannulation.  EBL Minimal  Specimen(s) None

## 2021-02-01 NOTE — Progress Notes (Incomplete)
PROGRESS NOTE    Elizabeth [name]  QZR:[id number] DOB: 1951/03/09 DOA: 01/28/2021 PCP: Birdie Sons, MD   Brief Narrative: Elizabeth [name] is a 70 y.o. ***   Assessment & Plan:   Principal Problem:   HCAP (healthcare-associated pneumonia) Active Problems:   Anxiety   GERD (gastroesophageal reflux disease)   Malignant neoplasm of upper-outer quadrant of right female breast (HCC)   Adenocarcinoma of right lung, stage 3 (HCC)   Pulmonary embolism (HCC)   Pleural effusion, malignant   Acute deep vein thrombosis (DVT) of distal end of right lower extremity (HCC)   Malnutrition of moderate degree   ***  -----     DVT prophylaxis: *** Code Status:   Code Status: Full Code Family Communication: *** Disposition Plan: ***   Consultants:   ***  Procedures:   ***  Antimicrobials:  ***    Subjective: ***  Objective: Vitals:   01/31/21 2022 02/01/21 0021 02/01/21 0531 02/01/21 0556  BP: 109/84 100/84 117/87 101/86  Pulse: (!) 126 (!) 117 70 (!) 129  Resp: 20 20 (!) 22 20  Temp: (!) 97.5 F (36.4 C) (!) 97.3 F (36.3 C) 97.8 F (36.6 C) (!) 96.6 F (35.9 C)  TempSrc: Oral Oral  Axillary  SpO2: 94% 99% 90% 92%  Height:        Intake/Output Summary (Last 24 hours) at 02/01/2021 3545 Last data filed at 02/01/2021 0720 Gross per 24 hour  Intake 120 ml  Output 1000 ml  Net -880 ml   There were no vitals filed for this visit.  Examination:  General exam: Appears calm and comfortable *** Respiratory system: Clear to auscultation. Respiratory effort normal. Cardiovascular system: S1 & S2 heard, RRR. No murmurs, rubs, gallops or clicks. Gastrointestinal system: Abdomen is nondistended, soft and nontender. No organomegaly or masses felt. Normal bowel sounds heard. Central nervous system: Alert and oriented. No focal neurological deficits. Musculoskeletal: No edema. No calf tenderness Skin: No cyanosis. No rashes Psychiatry:  Judgement and insight appear normal. Mood & affect appropriate.     Data Reviewed: I have personally reviewed following labs and imaging studies  CBC Lab Results  Component Value Date   WBC 25.1 (H) 02/01/2021   RBC 4.57 02/01/2021   HGB 12.6 02/01/2021   HCT 42.5 02/01/2021   MCV 93.0 02/01/2021   MCH 27.6 02/01/2021   PLT 764 (H) 02/01/2021   MCHC 29.6 (L) 02/01/2021   RDW 16.8 (H) 02/01/2021   LYMPHSABS 0.7 02/02/2021   MONOABS 1.9 (H) 01/18/2021   EOSABS 0.6 (H) 01/25/2021   BASOSABS 0.1 62/56/3893     Last metabolic panel Lab Results  Component Value Date   NA 134 (L) 02/01/2021   K 4.6 02/01/2021   CL 92 (L) 02/01/2021   CO2 34 (H) 02/01/2021   BUN 11 02/01/2021   CREATININE 0.43 (L) 02/01/2021   GLUCOSE 164 (H) 02/01/2021   GFRNONAA >60 02/01/2021   GFRAA >60 05/25/2020   CALCIUM 8.7 (L) 02/01/2021   PROT 5.4 (L) 02/01/2021   ALBUMIN 2.0 (L) 02/01/2021   LABGLOB 2.1 02/25/2019   AGRATIO 2.2 02/25/2019   BILITOT 0.4 02/01/2021   ALKPHOS 66 02/01/2021   AST 12 (L) 02/01/2021   ALT 17 02/01/2021   ANIONGAP 8 02/01/2021    CBG (last 3)  Recent Labs    02/01/21 0612  GLUCAP 136*     GFR: CrCl cannot be calculated (Unknown ideal weight.).  Coagulation Profile: No results for input(s): INR, PROTIME in  the last 168 hours.  Recent Results (from the past 240 hour(s))  SARS CORONAVIRUS 2 (TAT 6-24 HRS) Nasopharyngeal Nasopharyngeal Swab     Status: None   Collection Time: 01/06/2021  7:43 PM   Specimen: Nasopharyngeal Swab  Result Value Ref Range Status   SARS Coronavirus 2 NEGATIVE NEGATIVE Final    Comment: (NOTE) SARS-CoV-2 target nucleic acids are NOT DETECTED.  The SARS-CoV-2 RNA is generally detectable in upper and lower respiratory specimens during the acute phase of infection. Negative results do not preclude SARS-CoV-2 infection, do not rule out co-infections with other pathogens, and should not be used as the sole basis for treatment or  other patient management decisions. Negative results must be combined with clinical observations, patient history, and epidemiological information. The expected result is Negative.  Fact Sheet for Patients: SugarRoll.be  Fact Sheet for Healthcare Providers: https://www.woods-mathews.com/  This test is not yet approved or cleared by the [place] FDA and  has been authorized for detection and/or diagnosis of SARS-CoV-2 by FDA under an Emergency Use Authorization (EUA). This EUA will remain  in effect (meaning this test can be used) for the duration of the COVID-19 declaration under Se ction 564(b)(1) of the Act, 21 U.S.C. section 360bbb-3(b)(1), unless the authorization is terminated or revoked sooner.  Performed at [facility], [place] [address], [place], [place] [address]   MRSA PCR Screening     Status: None   Collection Time: 01/30/21 12:53 PM   Specimen: Nasal Mucosa; Nasopharyngeal  Result Value Ref Range Status   MRSA by PCR NEGATIVE NEGATIVE Final    Comment:        The GeneXpert MRSA Assay (FDA approved for NASAL specimens only), is one component of a comprehensive MRSA colonization surveillance program. It is not intended to diagnose MRSA infection nor to guide or monitor treatment for MRSA infections. Performed at [facility], [place] [address], [place], [place] [address]         Radiology Studies: CT CHEST W CONTRAST  Result Date: 01/31/2021 CLINICAL DATA:  70 year old female with pneumonia. EXAM: CT CHEST WITH CONTRAST TECHNIQUE: Multidetector CT imaging of the chest was performed during intravenous contrast administration. CONTRAST:  20mL OMNIPAQUE IOHEXOL 300 MG/ML  SOLN COMPARISON:  Chest CT dated 05/12/2020 and radiograph dated 01/31/2021. FINDINGS: Cardiovascular: There is no cardiomegaly or pericardial effusion. The thoracic aorta is unremarkable. The origins of the great vessels of the  aortic arch appear patent. No pulmonary artery embolus identified. Mediastinum/Nodes: No hilar adenopathy noted. Evaluation however is very limited due to consolidative changes of the adjacent lungs. No mediastinal adenopathy. The esophagus is grossly unremarkable. No mediastinal fluid collection. Lungs/Pleura: Large bilateral pleural effusions, increased since the prior CT 05/12/2020. The right pleural effusion is new. There is complete consolidation of the right lower lobe with partial compressive atelectasis of the right middle lobe. There is consolidative changes of the majority of the left lung. There is apparent mild diffuse nodular enhancement of the pleural surfaces which may be seen in the setting of an infectious process, but is concerning for underlying malignancy. Further evaluation with cytology and culture analysis of the pleural effusion fluid recommended. Bilateral chest tubes with tips in the left anterior pleural base and along the posterior pleural based on the right. There is diffuse interstitial and interlobular septal prominence predominantly involving the left lung. There is no pneumothorax. The central airways remain patent. Upper Abdomen: Small left liver cyst. Musculoskeletal: Scoliosis and degenerative changes of the  spine. No acute osseous pathology. IMPRESSION: 1. No central pulmonary artery embolus. 2. Large bilateral pleural effusions, increased since the prior CT 05/12/2020. There is mild nodular enhancement of the pleural surfaces which may be related to an infectious process/empyema or malignancy. Further evaluation with culture and cytology analysis of the pleural fluid is recommended. 3. Extensive consolidative changes of the left lung and complete consolidation of the right lower lobe. Electronically Signed   By: Anner Crete M.D.   On: 01/31/2021 16:21   DG CHEST PORT 1 VIEW  Result Date: 01/31/2021 CLINICAL DATA:  Shortness of breath EXAM: PORTABLE CHEST 1 VIEW  COMPARISON:  01/24/2021 FINDINGS: Moderate to large left pleural effusion. Small to moderate right pleural effusion. Left upper lobe/perihilar opacity, possibly reflecting pneumonia, although left upper lobe neoplasm is certainly possible. Bilateral lower lobe opacities, likely compressive atelectasis. No pneumothorax. The heart is normal in size. Thoracic levoscoliosis. IMPRESSION: Left upper lobe/perihilar opacity, possibly reflecting pneumonia, although left upper lobe neoplasm is certainly possible. Moderate to large left pleural effusion. Small to moderate right pleural effusion. Bilateral lower lobe opacities, likely compressive atelectasis. Overall appearance is unchanged from recent chest radiographs. Electronically Signed   By: Julian Hy M.D.   On: 01/31/2021 13:48        Scheduled Meds: . ALPRAZolam  1.5 mg Oral QHS  . apixaban  5 mg Oral BID  . Chlorhexidine Gluconate Cloth  6 each Topical Daily  . lactose free nutrition  237 mL Oral TID WC  . mouth rinse  15 mL Mouth Rinse BID  . multivitamin with minerals  1 tablet Oral Daily  . pramipexole  1 mg Oral QHS   Continuous Infusions: . ceFEPime (MAXIPIME) IV 2 g (02/01/21 0446)  . lactated ringers    . vancomycin 1,250 mg (01/31/21 2154)     LOS: 3 days     Cordelia Poche, MD [facility] 02/01/2021, 8:08 AM  If 7PM-7AM, please contact night-coverage [email or web address]

## 2021-02-01 NOTE — Progress Notes (Signed)
Interdisciplinary Goals of Care Family Meeting    Date carried out:: 02/01/2021  Location of the meeting: Conference room  Member's involved: Nurse Practitioner, Bedside Registered Nurse, Chaplain and Family Member or next of kin  Durable Power of Attorney or acting medical decision maker: husband    Discussion: We discussed goals of care for Elizabeth [name] .  We discussed her advanced cancer, poor prognosis with mechanical ventilation, and potential interventions available.  Code status: Limited Code or DNR with short term mechanical ventilation trial, okay to use vasoactive drips and pressors however should she suffer cardiac arrest we would not perform CPR or defibrillation.  The agreement was to continue supportive care for 24 hours and look for improvement with the hopes that her hypercarbia could be reversed  Disposition: Continue current acute care  Time spent for the meeting: 35 minutes  Clementeen Graham 02/01/2021 4:43 PM

## 2021-02-01 NOTE — Progress Notes (Addendum)
70 year old with metastatic breast cancer and bilateral malignant effusion status post bilateral Pleurx catheter.  PCCM consulted for increasing hypoxia and effusions in spite of drainage 4/27 she was transferred emergently to the ICU due to altered mental status in the form of decreased responsiveness and placed on BiPAP  SIGNIFICANT EVENTS  4/26 bilateral Pleurx catheter drainage  STUDIES:  CTA chest 4/26 >> Large bilateral pleural effusions, increased since the prior CT 05/12/2020. There is mild nodular enhancement of the pleural surfaces. Extensive consolidative changes of the left lung and complete consolidation of the right lower lobe.  On exam -chronically ill-appearing, minimally responsive to deep painful stimulus, on BiPAP full facemask, tidal volumes 200 range, decreased breath sounds bilateral, S1-S2 tacky, soft nontender, 2+ edema with purple discoloration of toes, able to Doppler pulses.  ABG shows acute respiratory acidosis Pleural fluid cultures 4/27 NG, blood culture no growth  Labs show mild hyponatremia, persistent leukocytosis.  Impression/plan Acute hypoxic/hypercarbic respiratory failure - Fever  restrictive lung disease due to malignant effusions, differential includes narcotic and benzodiazepine, however no response to Narcan, possible sepsis.   -Proceed with intubation, goals of care discussed with patient's husband and DNR issued, plan will be for short-term mechanical ventilation to see if we can turn things around -Vent and sedation orders given, await repeat ABG. -Chest x-ray postintubation shows clearing of right effusion postdrainage, left hemiopacity which is likely due to layering effusion. Will also drain left effusion with Pleurx and if persistent left AV opacity will consider bronchoscopy  Septic shock -mildly hypotensive initially and this dropped further post intubation. Using Neo-Synephrine. Continue cefepime and bank broad-spectrum antibiotics,  check lactic acid for completion.  Acute metabolic encephalopathy -PAD protocol with fentanyl, goal RASS 0 to -1  PAD -lower extremity arterial Dopplers did not show any decrease, she remains on Eliquis for prior history of VTE  Overall prognosis appears bleak given advanced metastatic breast cancer and now requiring mechanical ventilation  My critical care time independent of procedures was 50 minutes  Kara Mead MD. FCCP. [facility] Pager : [phone]  If no response to pager , please call [phone] until 7 pm After 7:00 pm call Elink  [phone]   02/01/2021

## 2021-02-01 NOTE — Progress Notes (Signed)
Provided follow up support.  At this time family is just taking one moment at a time.  Elizabeth [name]'s husband was trying to help her communicate through writing, but it was difficult for him to understand.  They are feeling hopeful and asked for continued prayers for Elizabeth [name].  [place], [place] Pager, [phone] 5:10 PM

## 2021-02-02 ENCOUNTER — Inpatient Hospital Stay (HOSPITAL_COMMUNITY): Payer: Medicare Other

## 2021-02-02 DIAGNOSIS — J9811 Atelectasis: Secondary | ICD-10-CM | POA: Diagnosis not present

## 2021-02-02 DIAGNOSIS — J189 Pneumonia, unspecified organism: Secondary | ICD-10-CM | POA: Diagnosis not present

## 2021-02-02 DIAGNOSIS — J910 Malignant pleural effusion: Secondary | ICD-10-CM | POA: Diagnosis not present

## 2021-02-02 DIAGNOSIS — J9601 Acute respiratory failure with hypoxia: Secondary | ICD-10-CM | POA: Diagnosis not present

## 2021-02-02 LAB — CORTISOL: Cortisol, Plasma: 66.6 ug/dL

## 2021-02-02 LAB — CBC
HCT: 38.6 % (ref 36.0–46.0)
Hemoglobin: 12 g/dL (ref 12.0–15.0)
MCH: 27.5 pg (ref 26.0–34.0)
MCHC: 31.1 g/dL (ref 30.0–36.0)
MCV: 88.5 fL (ref 80.0–100.0)
Platelets: 722 10*3/uL — ABNORMAL HIGH (ref 150–400)
RBC: 4.36 MIL/uL (ref 3.87–5.11)
RDW: 17 % — ABNORMAL HIGH (ref 11.5–15.5)
WBC: 24.6 10*3/uL — ABNORMAL HIGH (ref 4.0–10.5)
nRBC: 0 % (ref 0.0–0.2)

## 2021-02-02 LAB — GLUCOSE, CAPILLARY
Glucose-Capillary: 150 mg/dL — ABNORMAL HIGH (ref 70–99)
Glucose-Capillary: 151 mg/dL — ABNORMAL HIGH (ref 70–99)
Glucose-Capillary: 151 mg/dL — ABNORMAL HIGH (ref 70–99)
Glucose-Capillary: 153 mg/dL — ABNORMAL HIGH (ref 70–99)
Glucose-Capillary: 155 mg/dL — ABNORMAL HIGH (ref 70–99)
Glucose-Capillary: 158 mg/dL — ABNORMAL HIGH (ref 70–99)
Glucose-Capillary: 167 mg/dL — ABNORMAL HIGH (ref 70–99)
Glucose-Capillary: 180 mg/dL — ABNORMAL HIGH (ref 70–99)

## 2021-02-02 LAB — COMPREHENSIVE METABOLIC PANEL
ALT: 14 U/L (ref 0–44)
AST: 11 U/L — ABNORMAL LOW (ref 15–41)
Albumin: 1.8 g/dL — ABNORMAL LOW (ref 3.5–5.0)
Alkaline Phosphatase: 68 U/L (ref 38–126)
Anion gap: 10 (ref 5–15)
BUN: 17 mg/dL (ref 8–23)
CO2: 28 mmol/L (ref 22–32)
Calcium: 9 mg/dL (ref 8.9–10.3)
Chloride: 97 mmol/L — ABNORMAL LOW (ref 98–111)
Creatinine, Ser: 0.69 mg/dL (ref 0.44–1.00)
GFR, Estimated: >60 mL/min (ref >60)
Glucose, Bld: 216 mg/dL — ABNORMAL HIGH (ref 70–99)
Potassium: 4.8 mmol/L (ref 3.5–5.1)
Sodium: 135 mmol/L (ref 135–145)
Total Bilirubin: 0.3 mg/dL (ref 0.3–1.2)
Total Protein: 5 g/dL — ABNORMAL LOW (ref 6.5–8.1)

## 2021-02-02 LAB — BLOOD GAS, ARTERIAL
Acid-Base Excess: 7.2 mmol/L — ABNORMAL HIGH (ref 0.0–2.0)
Bicarbonate: 30.4 mmol/L — ABNORMAL HIGH (ref 20.0–28.0)
Drawn by: 23282
FIO2: 50
Mode: 24
O2 Saturation: 94.4 %
PEEP: 8 cmH2O
Patient temperature: 98.6
RATE: 24 resp/min
pCO2 arterial: 38.5 mmHg (ref 32.0–48.0)
pH, Arterial: 7.51 — ABNORMAL HIGH (ref 7.350–7.450)
pO2, Arterial: 69.9 mmHg — ABNORMAL LOW (ref 83.0–108.0)

## 2021-02-02 LAB — LACTIC ACID, PLASMA: Lactic Acid, Venous: 2.1 mmol/L (ref 0.5–1.9)

## 2021-02-02 LAB — CYTOLOGY - NON PAP

## 2021-02-02 MED ORDER — SODIUM CHLORIDE 0.9 % IV SOLN
INTRAVENOUS | Status: DC | PRN
  Administered 2021-02-02: 1000 mL via INTRAVENOUS

## 2021-02-02 MED ORDER — DEXMEDETOMIDINE HCL IN NACL 200 MCG/50ML IV SOLN
0.0000 ug/kg/h | INTRAVENOUS | Status: DC
  Administered 2021-02-02 (×2): 0.4 ug/kg/h via INTRAVENOUS
  Administered 2021-02-02: 0.5 ug/kg/h via INTRAVENOUS
  Administered 2021-02-02: 0.6 ug/kg/h via INTRAVENOUS
  Administered 2021-02-03: 0.5 ug/kg/h via INTRAVENOUS
  Filled 2021-02-02 (×5): qty 50

## 2021-02-02 MED ORDER — LACTATED RINGERS IV BOLUS
750.0000 mL | Freq: Once | INTRAVENOUS | Status: AC
  Administered 2021-02-02: 750 mL via INTRAVENOUS
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
## 2021-02-02 NOTE — Progress Notes (Signed)
Name: Elizabeth [name] MRN: [id number] DOB: [birth year]    ADMISSION DATE:  01/11/2021 CONSULTATION DATE:  02/02/2021   REFERRING MD :  Wyline Copas , triad   CHIEF COMPLAINT:  pleural effusions, increased oxygen requirements  BRIEF PATIENT DESCRIPTION:  70 year old with metastatic breast cancer and bilateral malignant effusion status post bilateral Pleurx catheter.  We are consulted for increasing hypoxia and effusions in spite of drainage    PAST MEDICAL HISTORY :   has a past medical history of Anginal pain ([place]), Anxiety, Breast cancer, right ([place]) (16/0109), Complication of anesthesia, Constipation due to opioid therapy, Degenerative joint disease (DJD) of hip, GERD (gastroesophageal reflux disease), Headache, Osteopenia, Pneumonia, Restless leg syndrome, Scoliosis, SUI (stress urinary incontinence, female), and Vaso-vagal reaction.  has a past surgical history that includes Back surgery; Laminectomy (2006); Tonsillectomy; Appendectomy (1963); Breast surgery; Eye surgery; Rotator cuff repair (Bilateral); Hysterotomy; Tubal ligation; Foot surgery (Bilateral); Total hip arthroplasty (Right, 01/18/2015); Breast cyst aspiration (Right, 1988); Cataract extraction w/PHACO (Right, 03/31/2018); Cataract extraction w/PHACO (Left, 04/16/2018); Breast excisional biopsy (Right, 1990); Breast biopsy (Right, 04/27/2019); Breast lumpectomy (Right, 05/08/2019); Breast lumpectomy with sentinel lymph node bx (Right, 05/08/2019); Video bronchoscopy with endobronchial ultrasound (N/A, 12/22/2019); and Fine needle aspiration (12/22/2019).  SIGNIFICANT EVENTS  4/24 admitted. Vanc and cefepime started. Blood cultures sent  4/26 PCCM consulted bilateral Pleurx catheter drainage CTA chest 4/26 >> Large bilateral pleural effusions, increased since the prior CT 05/12/2020. There is mild nodular enhancement of the pleural surfaces. Extensive consolidative changes of the left lung and complete consolidation of the right  lower lobe. We recommend increased frequency of chest drainage. Also noted to have cold LEs and be mottled we recommended LE dopplers. Rapid response called during evening for decreased LOC. Got Narcan and improved 4/27: moved to ICU. Unresponsive. ABG 7.28,78, 57 and 35 sats 86%. Failed BIPAP, right chest drained for about 800 ml, cultures sent. No response to Narcan. Goals of care discussed. Family agreed to intubation & 24 hrs support to see if Hypercarbia primary driving factor. Intubated. Had post intubation Hypotension. Got Volume resuscitation but ultimately required Norepinephrine infusion. Limited IV access required Left IJ CVL placement.Post intubation had complete opacification of left hemithorax. Drained left Pleural fluid collection and only got ~365ml. No sig improvement radiographically so proceeded w/ bronchoscopy and lavage w/ improved post lavage aeration on CXR. Patient waking up in afternoon and interacting w/ family. Arterial US obtained did not suggest evidence of sig LE arterial disease.  4/28 awake. Initially failed weaning attempt w/ low VT and high RR. Placed both pleur-x to sxn. Added precedex for agitation/anxiety. Checking cortisol.   SUBJECTIVE:  She is fully awake and interactive.  Does report dyspnea especially during spontaneous breathing trial requiring her to sit essentially completely upright VITAL SIGNS: Temp:  [98.1 F (36.7 C)-99.5 F (37.5 C)] 98.2 F (36.8 C) (04/28 0343) Pulse Rate:  [31-176] 137 (04/28 0645) Resp:  [20-38] 29 (04/28 0645) BP: (55-175)/(19-144) 125/39 (04/28 0645) SpO2:  [61 %-100 %] 94 % (04/28 0645) FiO2 (%):  [50 %-100 %] 50 % (04/28 0325) Weight:  [75.6 kg] 75.6 kg (04/28 0500)  PHYSICAL EXAMINATION:  General 70 year old white female currently on full ventilator support she is anxious after trying pressure support attempt HEENT normocephalic atraumatic orally intubated orogastric tube in place pupils equal reactive left IJ  triple-lumen catheter dressing clean dry and intact Pulmonary: Diminished bilaterally improved aeration specifically on the left side when comparing exam yesterday.  Tidal volumes limited less than 200 mL  on pressure support ventilation attempts even titrating up to in excess of 16 pressure support. Cardiac: Tachycardic regular rhythm with systolic murmur Abdomen: Soft nontender Extremities: Cool, still somewhat mottled, increasing lower extremity edema Neuro: Now fully awake, interactive but anxious a little tremulous at times moving all extremities but generalized weakness GU clear yellow urine  Impression/plan Acute on chronic hypoxic and  hypercarbic respiratory failure. Multifactorial. Post-narcotic sedation and mucous plugging w/ atelectasis +/- PNA (NOS). Small apical PTX vs trapped lung Superimposed on malignant bilateral effusions.  pcxr ett good position. Marked improved left sided aeration w/ residual loculated fluid. Has bilateral infiltrates. These look pretty much unchanged. She has small apical lucency that could reflect pneumothorax but may also reflect skin fold or simply lung trapped lung.  -vent setting improved -looking at ABG today given her metabolic status I suspect she is chronically hypercarbic  Plan Wean Peep/FIO2 Attempt SBT this am At this point will either drain both fluid collections daily OR simply connect to [place] for now (if we can find adapter). This would be helpful especially on the right side.  Day 4 cefepime and vanc -> her PCR for MRSA has been neg so will stop vanc F/u pending pleural fluid cultures and FOB specimens Change PAD goal to RASS 0 Cont VAP bundle.  Cont pulm hygiene measures: HT saline neb and chest physiotherapy  Am cxr If we get her extubated the next challenge will be figuring out safe narcotic regimen    Severe sepsis/septic shock w/ lactic acidosis.  Working dx of PNA (NOS) as source. LA cleared slowly. Weaning pressors.  Accuracy  of BP has been issue. Would like to avoid further interventions given nature of goals of care discussion Plan Cont to wean pressors for SBP > 100 Ck Cortisol abx as above Keep euvolemic.   Acute metabolic encephalopathy. Did not respond to narcan. But did respond to mechanical ventilation so seems like primarily 2/2 hypercarbia.  Plan Supportive care Limit narcotics  Leukocytosis Plan abx as above Trend cbc abx  h/o  DVT involving RLE (about 1 yr ago) Plan Cont ac   Peripheral arterial disease  Dopplers neg for occlusion Plan Cont systemic ac   DM w/ hyperglycemia Gluc w/in goal Plan ssi w/ goal glucose 140-180   Stage IV breast cancer Plan Supportive care  Protein calorie malnutrition in the setting of underlying malignancy Plan Keeping her n.p.o. for now, if not able to extubate later today we will start tube feeds  Prior colon cancer  Plan Colostomy care   Best Practice (right click and "Reselect all SmartList Selections" daily)   Diet:  NPO Pain/Anxiety/Delirium protocol (if indicated): Yes (RASS goal 0) VAP protocol (if indicated): Yes DVT prophylaxis: Other (comment) GI prophylaxis: PPI Glucose control:  SSI No   Central venous access:  Yes, and it is still needed Arterial line:  N/A Foley:  N/A Restraints ACTION; IS/IS NOT: is Restraint type: Soft restraint:  bilateral wrist Mobility:  bed rest  PT consulted: N/A Studies pending: none  Culture data pending: blood culture and pleural right and FOB Last reviewed culture data:today Antibiotics: cefepime Antibiotic de-escalation: Yes Stop date: does not have  TBD Daily labs: requested Code Status:  DNR Prognosis: Life-threating Last date of multidisciplinary goals of care discussion 4/27  Disposition: Continue present management    My cct 50 minutes. This included about one third time chart review, one third time direct face-to-face evaluation and discussion with patient and family as well  as nursing  staff and the final one third time formulating plan  Erick Colace ACNP-BC [place] Pager # [phone] OR # [phone] if no answer

## 2021-02-02 NOTE — Progress Notes (Signed)
Patient with inconsistent high and low BP in all extremities. Decreased Urine output. Bladder scan with 101 ml, HR 130s. See flow sheet for vital signs and assessment. [place] notified. No new orders at this time. Will continue to monitor patient.

## 2021-02-02 NOTE — Progress Notes (Signed)
70 year old with metastatic breast cancer and bilateral malignant effusion status post bilateral Pleurx catheter. PCCM consulted for increasing hypoxia and effusions in spite of drainage 4/27 she was transferred emergently to the ICU due to altered mental status in the form of decreased responsiveness , failed BiPAP and subsequently intubated  SIGNIFICANT EVENTS 4/26 bilateral Pleurx catheter drainage 4/27 ETT >> 4/27 LIJ >> 4/27 bronchoscopy -for left atelectasis, removal of mucoid secretions from left lung  Remains critically ill, intubated, on Levophed. On exam -more awake, follows one-step commands, improved breath sounds on left, soft nontender abdomen, 2+ edema, purple toes and feet with dopplerable pulses.  Chest x-ray 4/28 independently reviewed shows small right apical pneumothorax, improved aeration in left lung, ET tube and left IJ central line in position. ABG shows compensated hypercarbia, now over-ventilated.  Labs show normal electrolytes, leukocytosis, lactic 2.5, normal renal function, low albumin 1.8.  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 -Start spontaneous breathing trials with goal one-way extubation  Septic shock/H CAP -Continue Levophed to goal MAP -Empiric cefepime to continue for 7 days for HCAP, await BAL cultures, okay to discontinue vancomycin since MRSA PCR negative.  Acute metabolic encephalopathy -improved PAD protocol with fentanyl goal RASS 0 to -1 PAD -lower extremity Dopplers do not show any arterial occlusion, continue Eliquis for prior history of VTE  Goals of care discussion as outlined 4/27 , aiming for one-way extubation once we optimize her from a respiratory standpoint  My independent critical care time x 35 m  Seyed Heffley V. Elsworth Soho MD

## 2021-02-02 NOTE — Progress Notes (Signed)
Bedside RN called concerning unreliable BP. D/w EMD Dr Jeannene Patella. Decision made to defer management to day team.

## 2021-02-02 NOTE — Plan of Care (Signed)

## 2021-02-03 ENCOUNTER — Inpatient Hospital Stay (HOSPITAL_COMMUNITY): Payer: Medicare Other | Admitting: Certified Registered Nurse Anesthetist

## 2021-02-03 ENCOUNTER — Inpatient Hospital Stay (HOSPITAL_COMMUNITY): Payer: Medicare Other

## 2021-02-03 DIAGNOSIS — R531 Weakness: Secondary | ICD-10-CM

## 2021-02-03 DIAGNOSIS — J189 Pneumonia, unspecified organism: Secondary | ICD-10-CM | POA: Diagnosis not present

## 2021-02-03 DIAGNOSIS — J9602 Acute respiratory failure with hypercapnia: Secondary | ICD-10-CM | POA: Diagnosis not present

## 2021-02-03 DIAGNOSIS — C3491 Malignant neoplasm of unspecified part of right bronchus or lung: Secondary | ICD-10-CM | POA: Diagnosis not present

## 2021-02-03 DIAGNOSIS — R0602 Shortness of breath: Secondary | ICD-10-CM

## 2021-02-03 DIAGNOSIS — R6521 Severe sepsis with septic shock: Secondary | ICD-10-CM

## 2021-02-03 DIAGNOSIS — A419 Sepsis, unspecified organism: Secondary | ICD-10-CM

## 2021-02-03 DIAGNOSIS — J9601 Acute respiratory failure with hypoxia: Secondary | ICD-10-CM | POA: Diagnosis not present

## 2021-02-03 DIAGNOSIS — Z7189 Other specified counseling: Secondary | ICD-10-CM

## 2021-02-03 DIAGNOSIS — Z515 Encounter for palliative care: Secondary | ICD-10-CM | POA: Diagnosis not present

## 2021-02-03 LAB — CULTURE, RESPIRATORY W GRAM STAIN: Culture: NORMAL

## 2021-02-03 LAB — GLUCOSE, CAPILLARY
Glucose-Capillary: 161 mg/dL — ABNORMAL HIGH (ref 70–99)
Glucose-Capillary: 151 mg/dL — ABNORMAL HIGH (ref 70–99)
Glucose-Capillary: 135 mg/dL — ABNORMAL HIGH (ref 70–99)

## 2021-02-03 LAB — COMPREHENSIVE METABOLIC PANEL
ALT: 14 U/L (ref 0–44)
AST: 15 U/L (ref 15–41)
Albumin: 1.6 g/dL — ABNORMAL LOW (ref 3.5–5.0)
Alkaline Phosphatase: 68 U/L (ref 38–126)
Anion gap: 6 (ref 5–15)
BUN: 16 mg/dL (ref 8–23)
CO2: 30 mmol/L (ref 22–32)
Calcium: 9.1 mg/dL (ref 8.9–10.3)
Chloride: 98 mmol/L (ref 98–111)
Creatinine, Ser: 0.38 mg/dL — ABNORMAL LOW (ref 0.44–1.00)
GFR, Estimated: >60 mL/min (ref >60)
Glucose, Bld: 170 mg/dL — ABNORMAL HIGH (ref 70–99)
Potassium: 4.5 mmol/L (ref 3.5–5.1)
Sodium: 134 mmol/L — ABNORMAL LOW (ref 135–145)
Total Bilirubin: 0.5 mg/dL (ref 0.3–1.2)
Total Protein: 4.7 g/dL — ABNORMAL LOW (ref 6.5–8.1)

## 2021-02-03 LAB — CBC
HCT: 37.1 % (ref 36.0–46.0)
Hemoglobin: 11.8 g/dL — ABNORMAL LOW (ref 12.0–15.0)
MCH: 27.6 pg (ref 26.0–34.0)
MCHC: 31.8 g/dL (ref 30.0–36.0)
MCV: 86.9 fL (ref 80.0–100.0)
Platelets: 640 10*3/uL — ABNORMAL HIGH (ref 150–400)
RBC: 4.27 MIL/uL (ref 3.87–5.11)
RDW: 17.2 % — ABNORMAL HIGH (ref 11.5–15.5)
WBC: 30.5 10*3/uL — ABNORMAL HIGH (ref 4.0–10.5)
nRBC: 0 % (ref 0.0–0.2)

## 2021-02-03 MED ORDER — FENTANYL CITRATE (PF) 100 MCG/2ML IJ SOLN: 12.5000 ug | INTRAMUSCULAR | Status: DC | PRN

## 2021-02-03 MED ORDER — LORAZEPAM 2 MG/ML IJ SOLN
0.5000 mg | INTRAMUSCULAR | Status: DC | PRN
  Administered 2021-02-03 – 2021-02-04 (×2): 0.5 mg via INTRAVENOUS
  Filled 2021-02-03 (×2): qty 1

## 2021-02-03 MED ORDER — MORPHINE SULFATE (PF) 2 MG/ML IV SOLN
1.0000 mg | INTRAVENOUS | Status: DC | PRN
  Administered 2021-02-03: 1 mg via INTRAVENOUS
  Filled 2021-02-03: qty 1

## 2021-02-03 MED ORDER — PROPOFOL 10 MG/ML IV BOLUS
INTRAVENOUS | Status: AC
  Filled 2021-02-03: qty 20

## 2021-02-03 MED ORDER — LIP MEDEX EX OINT
TOPICAL_OINTMENT | CUTANEOUS | Status: AC
  Filled 2021-02-03: qty 7

## 2021-02-03 MED ORDER — PROPOFOL 10 MG/ML IV BOLUS
INTRAVENOUS | Status: DC | PRN
  Administered 2021-02-03: 40 mg via INTRAVENOUS

## 2021-02-03 MED ORDER — MORPHINE 100MG IN NS 100ML (1MG/ML) PREMIX INFUSION
1.0000 mg/h | INTRAVENOUS | Status: DC
  Administered 2021-02-03: 1 mg/h via INTRAVENOUS
  Administered 2021-02-04 (×2): 12 mg/h via INTRAVENOUS
  Administered 2021-02-04: 10 mg/h via INTRAVENOUS
  Administered 2021-02-04: 12 mg/h via INTRAVENOUS
  Filled 2021-02-03 (×4): qty 100

## 2021-02-03 MED ORDER — MORPHINE SULFATE (PF) 2 MG/ML IV SOLN: 1.0000 mg | INTRAVENOUS | Status: DC | PRN

## 2021-02-03 NOTE — Consult Note (Signed)
Consultation Note Date: 02/03/2021   Patient Name: Elizabeth [name]  DOB: [birth year]  MRN: [id number]  Age / Sex: 70 y.o., female  PCP: Birdie Sons, MD Referring Physician: Rigoberto Noel, MD  Reason for Consultation: Establishing goals of care  HPI/Patient Profile: 70 y.o. female admitted on 01/14/2021   Clinical Assessment and Goals of Care: 70 year old lady with metastatic breast cancer bilateral malignant effusion status post bilateral Pleurx catheter placement.  She is in the ICU with hypoxia and effusions, status post ventilator dependent respiratory failure, was extubated in a.m. on 02-03-2021.  Also on pressors and antibiotics. After the patient was extubated, goals of care were discussed and no reintubation, involvement of palliative care was recommended. Patient is resting in bed she is on nonrebreather mask as well as supplemental oxygen via nasal cannula.  She is able to mouth words appropriately she is able to write on the clipboard.  I introduced myself and palliative care as follows: Palliative medicine is specialized medical care for people living with serious illness. It focuses on providing relief from the symptoms and stress of a serious illness. The goal is to improve quality of life for both the patient and the family.  Goals of care: Broad aims of medical therapy in relation to the patient's values and preferences. Our aim is to provide medical care aimed at enabling patients to achieve the goals that matter most to them, given the circumstances of their particular medical situation and their constraints.   Brief life review performed.  Goals wishes and values important to the patient and family as a unit attempted to be explored.  Patient wishes to be home she is accepting of hospice support.  Patient has tenuous respiratory status and is hypoxic even on current supplemental  oxygen.  Patient complains of subjective sensation of dyspnea anxiety and chest discomfort as well as air hunger.  We discussed about comfort measures.  Pain and on pain symptom management options were discussed.  Judicious use of opioids for symptom relief was discussed.  Scope of comfort measures only was explained.  Home with hospice versus residential hospice options versus continuing care right ear in the hospital were all explored.  See below.  Thank you for the consult.   NEXT OF KIN Husband who is present at the bedside has 2 daughters Gl[place] and Nira Conn, she also has a son who lives in [place].  Patient lives with her husband in [place], [place].  SUMMARY OF RECOMMENDATIONS   Agree with DO NOT RESUSCITATE and DO NOT INTUBATE Continue current mode of care Add low-dose morphine for anxiety, air hunger and dyspnea management Out-of-town family members are trying to come in.  Overall focus is on comfort measures however continuation of pressors and antibiotics until additional family members can come into town has been requested by daughters present at the bedside. Prognosis is markedly limited could be as short as few days, anticipate hospital death.  It appears very difficult at this stage to arrange for home with hospice support.  Continue appropriate pain and on pain symptom management here in the hospital and observe for at least another 24 hours before making further decisions about home with hospice support.  Discussed extensively with patient as well as family in detail and they are in full understanding of the serious nature of the patient's condition at this time. Thank you for the consult. Code Status/Advance Care Planning:  DNR    Symptom Management:      Palliative Prophylaxis:   Delirium Protocol   Psycho-social/Spiritual:   Desire for further Chaplaincy support:yes  Additional Recommendations: Caregiving  Support/Resources  Prognosis:   Hours -  Days  Discharge Planning: Anticipated Hospital Death      Primary Diagnoses: Present on Admission: . HCAP (healthcare-associated pneumonia) . Acute deep vein thrombosis (DVT) of distal end of right lower extremity ([place]) . Adenocarcinoma of right lung, stage 3 ([place]) . Anxiety . GERD (gastroesophageal reflux disease) . Pleural effusion, malignant . Pulmonary embolism ([place]) . Malignant neoplasm of upper-outer quadrant of right female breast ([place])   I have reviewed the medical record, interviewed the patient and family, and examined the patient. The following aspects are pertinent.  Past Medical History:  Diagnosis Date  . Anginal pain ([place])   . Anxiety   . Breast cancer, right ([place]) 04/2019   breast cancer (radical lumpectomy and radiation  . Complication of anesthesia    one time woke when she had a tube down throat and had a panic attack  . Constipation due to opioid therapy   . Degenerative joint disease (DJD) of hip   . GERD (gastroesophageal reflux disease)   . Headache    migraines when she was working  . Osteopenia   . Pneumonia   . Restless leg syndrome    takes Mirapex and xanax  . Scoliosis   . SUI (stress urinary incontinence, female)   . Vaso-vagal reaction    after back surgery   Social History   Socioeconomic History  . Marital status: Married    Spouse name: Not on file  . Number of children: Not on file  . Years of education: Not on file  . Highest education level: Not on file  Occupational History  . Not on file  Tobacco Use  . Smoking status: Former Smoker    Packs/day: 0.50    Years: 20.00    Pack years: 10.00    Types: Cigarettes    Quit date: 01/07/2012    Years since quitting: 9.0  . Smokeless tobacco: Never Used  Vaping Use  . Vaping Use: Never used  Substance and Sexual Activity  . Alcohol use: Yes    Alcohol/week: 5.0 standard drinks    Types: 5 Glasses of wine per week    Comment: red wine  . Drug use: No  . Sexual activity:  Yes    Birth control/protection: Post-menopausal  Other Topics Concern  . Not on file  Social History Narrative  . Not on file   Social Determinants of Health   Financial Resource Strain: Not on file  Food Insecurity: Not on file  Transportation Needs: Not on file  Physical Activity: Not on file  Stress: Not on file  Social Connections: Not on file   Family History  Problem Relation Age of Onset  . Alcohol abuse Mother   . Cancer Mother 69       lung  . Migraines Mother   . Cancer Father 28       prostate  . Diabetes  Father   . Alcohol abuse Brother   . Cancer Brother        skin  . Cancer Daughter        breast  . Breast cancer Daughter 88  . Bladder Cancer Neg Hx   . Kidney cancer Neg Hx    Scheduled Meds: . ALPRAZolam  0.5 mg Per Tube QHS  . apixaban  5 mg Per Tube BID  . chlorhexidine gluconate (MEDLINE KIT)  15 mL Mouth Rinse BID  . Chlorhexidine Gluconate Cloth  6 each Topical Daily  . mouth rinse  15 mL Mouth Rinse 10 times per day  . pantoprazole (PROTONIX) IV  40 mg Intravenous Daily   Continuous Infusions: . sodium chloride 250 mL (02/03/21 1246)  . ceFEPime (MAXIPIME) IV 2 g (02/03/21 1248)  . norepinephrine (LEVOPHED) Adult infusion 16 mcg/min (02/03/21 0927)   PRN Meds:.albuterol, albuterol, fentaNYL (SUBLIMAZE) injection, lidocaine-prilocaine, morphine injection, [DISCONTINUED] ondansetron **OR** ondansetron (ZOFRAN) IV, sodium chloride flush Medications Prior to Admission:  Prior to Admission medications   Medication Sig Start Date End Date Taking? Authorizing Provider  albuterol (VENTOLIN HFA) 108 (90 Base) MCG/ACT inhaler Inhale 2 puffs into the lungs every 6 (six) hours as needed for wheezing or shortness of breath. 05/16/20  Yes Ghimire, Dante Gang, MD  ALPRAZolam Duanne Moron) 0.5 MG tablet TAKE 3 TABLETS BY MOUTH AT BEDTIME Patient taking differently: Take 1.5 mg by mouth at bedtime. 12/25/20  Yes Birdie Sons, MD  Cyanocobalamin (VITAMIN B 12 PO) Take  1 tablet by mouth daily.   Yes [provider]  ELIQUIS 5 MG TABS tablet TAKE 1 TABLET BY MOUTH TWICE A DAY Patient taking differently: Take 5 mg by mouth 2 (two) times daily. 01/27/21  Yes Birdie Sons, MD  lidocaine-prilocaine (EMLA) cream Apply to affected area once Patient taking differently: Apply 1 application topically daily as needed (prior to port being accessed.). 05/25/20  Yes Corcoran, Drue Second, MD  Multiple Vitamin (MULTIVITAMIN WITH MINERALS) TABS tablet Take 1 tablet by mouth daily.   Yes [provider]  nitroGLYCERIN (NITROSTAT) 0.4 MG SL tablet Place 1 tablet (0.4 mg total) under the tongue every 5 (five) minutes as needed for chest pain. Go to ER if third tablet is necessary Patient taking differently: Place 0.4 mg under the tongue every 5 (five) minutes x 3 doses as needed for chest pain. Go to ER if third tablet is necessary 05/03/20  Yes Fisher, Kirstie Peri, MD  ondansetron (ZOFRAN-ODT) 8 MG disintegrating tablet Take 8 mg by mouth every 8 (eight) hours as needed for nausea. 08/09/20  Yes [provider]  pramipexole (MIRAPEX) 1 MG tablet Take 1 tablet (1 mg total) by mouth at bedtime. 01/27/21 02/21/22 Yes Birdie Sons, MD  senna-docusate (SENOKOT-S) 8.6-50 MG tablet Take 1 tablet by mouth daily as needed for mild constipation. 06/03/20  Yes [provider]  valACYclovir (VALTREX) 500 MG tablet Take 1 tablet (500 mg total) by mouth daily as needed (Cold sores). Patient taking differently: Take 500 mg by mouth daily as needed (Cold sores). Cold sores 12/22/19  Yes Byrum, Rose Fillers, MD  dexamethasone (DECADRON) 4 MG tablet Take 1 tab two times a day the day before [place] chemo, then take 2 tabs once a day for 3 days starting the day after chemo. Patient not taking: Reported on 01/25/2021 05/25/20   Lequita Asal, MD  famotidine (PEPCID) 20 MG tablet One after supper Patient not taking: Reported on 01/18/2021 04/18/20  Tanda Rockers, MD   ivabradine (CORLANOR) 5 MG TABS tablet Take 2 hours prior to cardiac CT along with metoprolol (120m tablet) Patient not taking: No sig reported 05/12/20   AKate Sable MD  metoprolol tartrate (LOPRESSOR) 100 MG tablet Take 1 tablet (100 mg total) by mouth once for 1 dose. Take 2 hours prior to your CT scan. Patient not taking: No sig reported 05/12/20 05/12/20  AKate Sable MD  oxyCODONE-acetaminophen (PERCOCET/ROXICET) 5-325 MG tablet Take 1 tablet by mouth every 6 (six) hours as needed for severe pain. Patient not taking: Reported on 01/18/2021 05/25/20   CLequita Asal MD  pantoprazole (PROTONIX) 40 MG tablet Take 1 tablet (40 mg total) by mouth daily. Take 30-60 min before first meal of the day Patient not taking: Reported on 01/20/2021 04/18/20   WTanda Rockers MD  tamoxifen (NOLVADEX) 20 MG tablet TAKE 1 TABLET BY MOUTH ONCE DAILY Patient not taking: No sig reported 08/25/20   CLequita Asal MD   Allergies  Allergen Reactions  . Zostavax [Zoster Vaccine Live] Rash  . Penicillins Hives    Did it involve swelling of the face/tongue/throat, SOB, or low BP? No Did it involve sudden or severe rash/hives, skin peeling, or any reaction on the inside of your mouth or nose? Yes Did you need to seek medical attention at a hospital or doctor's office? Yes When did it last happen?14 or 15 If all above answers are "NO", may proceed with cephalosporin use.   . Naproxen Hives  . Zoloft [Sertraline Hcl] Hives  . Latex Itching and Rash    Condoms   Review of Systems + Shortness of breath Physical Exam Patient is currently on nonrebreather mask as well as high flow oxygen. Awake, is able to write on the clipboard to make her wishes known Diminished breath sounds with scattered rhonchi Has chest tubes bilaterally Pleurx tubes Regular rhythm Has colostomy Diffuse anasarca Attempts to verbalize some, writes appropriately, moves all extremities  Vital Signs: BP 117/81    Pulse 96   Temp 97.7 F (36.5 C) (Axillary)   Resp (!) 22   Ht _0  (1.575 m)   Wt 74.8 kg   SpO2 90%   BMI 30.16 kg/m  Pain Scale: CPOT   Pain Score: Asleep   SpO2: SpO2: 90 % O2 Device:SpO2: 90 % O2 Flow Rate: .O2 Flow Rate (L/min): 15 L/min  IO: Intake/output summary:   Intake/Output Summary (Last 24 hours) at 02/03/2021 1312 Last data filed at 02/03/2021 0600 Gross per 24 hour  Intake 2533.87 ml  Output 2675 ml  Net -141.13 ml    LBM: Last BM Date: 02/02/21 Baseline Weight: Weight: 75.6 kg Most recent weight: Weight: 74.8 kg     Palliative Assessment/Data:   PPS 30%  Time In:  12 Time Out:  1310 Time Total:  70  Greater than 50%  of this time was spent counseling and coordinating care related to the above assessment and plan.  Signed by: ZLoistine Chance MD   Please contact Palliative Medicine Team phone at [phone] questions and concerns.  For individual provider: See AShea Evans

## 2021-02-03 NOTE — TOC Initial Note (Addendum)
Transition of Care [facility]) - Initial/Assessment Note    Patient Details  Name: Elizabeth [name] MRN: [id number] Date of Birth: [birth year]  Transition of Care [facility]) CM/SW Contact:    Leeroy Cha, RN Phone Number: 02/03/2021, 9:14 AM  Clinical Narrative:                 SIGNIFICANT EVENTS  4/24 admitted. Vanc and cefepime started. Blood cultures sent  4/26 PCCM consulted bilateral Pleurx catheter drainage CTA chest 4/26 >> Large bilateral pleural effusions, increased since the prior CT 05/12/2020. There is mild nodular enhancement of the pleural surfaces. Extensive consolidative changes of the left lung and complete consolidation of the right lower lobe. We recommend increased frequency of chest drainage. Also noted to have cold LEs and be mottled we recommended LE dopplers. Rapid response called during evening for decreased LOC. Got Narcan and improved 4/27: moved to ICU. Unresponsive. ABG 7.28,78, 57 and 35 sats 86%. Failed BIPAP, right chest drained for about 800 ml, cultures sent. No response to Narcan. Goals of care discussed. Family agreed to intubation & 24 hrs support to see if Hypercarbia primary driving factor. Intubated. Had post intubation Hypotension. Got Volume resuscitation but ultimately required Norepinephrine infusion. Limited IV access required Left IJ CVL placement.Post intubation had complete opacification of left hemithorax. Drained left Pleural fluid collection and only got ~336ml. No sig improvement radiographically so proceeded w/ bronchoscopy and lavage w/ improved post lavage aeration on CXR. Patient waking up in afternoon and interacting w/ family. Arterial US obtained did not suggest evidence of sig LE arterial disease.  4/28: failed weaning Placed both pleur-x to sxn. Added precedex for agitation/anxiety.  4/29 extubated. Palliative care consulted.  Plan: following to what palliative care decision is made.  69 y.o. female with medical history significant  of Breast cancer and stage IV lung cancer who gets her care at [facility] Expected Discharge Plan: Home w Hospice Care Barriers to Discharge: Continued Medical Work up   Patient Goals and CMS Choice Patient states their goals for this hospitalization and ongoing recovery are:: on vent unable to state      Expected Discharge Plan and Services Expected Discharge Plan: [place]   Discharge Planning Services: CM Consult   Living arrangements for the past 2 months: Single Family Home                                      Prior Living Arrangements/Services Living arrangements for the past 2 months: Single Family Home Lives with:: Spouse                   Activities of Daily Living Home Assistive Devices/Equipment: Ostomy supplies,Oxygen ADL Screening (condition at time of admission) Patient's cognitive ability adequate to safely complete daily activities?: Yes Is the patient deaf or have difficulty hearing?: No Does the patient have difficulty seeing, even when wearing glasses/contacts?: No Does the patient have difficulty concentrating, remembering, or making decisions?: No Patient able to express need for assistance with ADLs?: Yes Does the patient have difficulty dressing or bathing?: No Independently performs ADLs?: Yes (appropriate for developmental age) Does the patient have difficulty walking or climbing stairs?: Yes Weakness of Legs: Both Weakness of Arms/Hands: Both  Permission Sought/Granted                  Emotional Assessment  Orientation: : Fluctuating Orientation (Suspected and/or reported Sundowners) Alcohol / Substance Use: Not Applicable Psych Involvement: No (comment)  Admission diagnosis:  Shortness of breath [R06.02] Cancer (HCC) [C80.1] HCAP (healthcare-associated pneumonia) [J18.9] Patient Active Problem List   Diagnosis Date Noted  . Pressure injury of skin 02/01/2021  . Atelectasis of left  lung   . Acute respiratory acidosis   . Cancer ([place])   . Malnutrition of moderate degree 01/31/2021  . HCAP (healthcare-associated pneumonia) 01/15/2021  . Cancer-related pain 05/28/2020  . Stage IV adenocarcinoma of lung ([place]) 05/25/2020  . Pleural effusion, malignant 05/18/2020  . Acute deep vein thrombosis (DVT) of distal end of right lower extremity ([place]) 05/18/2020  . Pleural effusion on left   . Pulmonary embolism ([place])   . Respiratory failure ([place]) 05/12/2020  . DOE (dyspnea on exertion) 04/18/2020  . Encounter for antineoplastic immunotherapy 03/31/2020  . Encounter for antineoplastic chemotherapy 01/12/2020  . Goals of care, counseling/discussion 01/12/2020  . Adenocarcinoma of right lung, stage 3 ([place]) 12/31/2019  . Mediastinal adenopathy 12/09/2019  . Right lower lobe pulmonary nodule 12/09/2019  . Elevated tumor markers 12/01/2019  . Localized osteoporosis without current pathological fracture 05/04/2019  . Malignant neoplasm of upper-outer quadrant of right female breast ([place]) 05/01/2019  . Age-related osteoporosis without current pathological fracture 05/01/2019  . Diverticulosis 12/05/2018  . GERD (gastroesophageal reflux disease) 01/30/2017  . Insomnia 01/30/2017  . Anxiety 09/07/2015  . Elevated WBC count 09/07/2015  . H/O total hip arthroplasty 09/07/2015  . HLD (hyperlipidemia) 09/07/2015  . Headache, migraine 09/07/2015  . Osteoarthritis 09/07/2015  . Arthropathy of temporomandibular joint 09/07/2015  . Olecranon bursitis 09/07/2015  . Major depressive disorder, single episode 09/07/2015  . Restless leg 07/08/2015  . Primary osteoarthritis of right hip 01/19/2015  . DJD (degenerative joint disease) 01/18/2015  . Left shoulder pain 10/05/2013   PCP:  Birdie Sons, MD Pharmacy:   [place] [address] [place] [address] Phone: [phone] Fax: [phone]     Social Determinants of  Health (SDOH) Interventions    Readmission Risk Interventions No flowsheet data found.

## 2021-02-03 NOTE — Progress Notes (Addendum)
Name: Elizabeth [name] MRN: [id number] DOB: 01-09-1951    ADMISSION DATE:  01/25/2021 CONSULTATION DATE:  02/03/2021   REFERRING MD :  Wyline Copas , triad   CHIEF COMPLAINT:  pleural effusions, increased oxygen requirements  BRIEF PATIENT DESCRIPTION:  70 year old with metastatic breast cancer and bilateral malignant effusion status post bilateral Pleurx catheter.  We are consulted for increasing hypoxia and effusions in spite of drainage    PAST MEDICAL HISTORY :   has a past medical history of Anginal pain ([place]), Anxiety, Breast cancer, right ([place]) (82/8003), Complication of anesthesia, Constipation due to opioid therapy, Degenerative joint disease (DJD) of hip, GERD (gastroesophageal reflux disease), Headache, Osteopenia, Pneumonia, Restless leg syndrome, Scoliosis, SUI (stress urinary incontinence, female), and Vaso-vagal reaction.  has a past surgical history that includes Back surgery; Laminectomy (2006); Tonsillectomy; Appendectomy (1963); Breast surgery; Eye surgery; Rotator cuff repair (Bilateral); Hysterotomy; Tubal ligation; Foot surgery (Bilateral); Total hip arthroplasty (Right, 01/18/2015); Breast cyst aspiration (Right, 1988); Cataract extraction w/PHACO (Right, 03/31/2018); Cataract extraction w/PHACO (Left, 04/16/2018); Breast excisional biopsy (Right, 1990); Breast biopsy (Right, 04/27/2019); Breast lumpectomy (Right, 05/08/2019); Breast lumpectomy with sentinel lymph node bx (Right, 05/08/2019); Video bronchoscopy with endobronchial ultrasound (N/A, 12/22/2019); and Fine needle aspiration (12/22/2019).  SIGNIFICANT EVENTS  4/24 admitted. Vanc and cefepime started. Blood cultures sent  4/26 PCCM consulted bilateral Pleurx catheter drainage CTA chest 4/26 >> Large bilateral pleural effusions, increased since the prior CT 05/12/2020. There is mild nodular enhancement of the pleural surfaces. Extensive consolidative changes of the left lung and complete consolidation of the right  lower lobe. We recommend increased frequency of chest drainage. Also noted to have cold LEs and be mottled we recommended LE dopplers. Rapid response called during evening for decreased LOC. Got Narcan and improved 4/27: moved to ICU. Unresponsive. ABG 7.28,78, 57 and 35 sats 86%. Failed BIPAP, right chest drained for about 800 ml, cultures sent. No response to Narcan. Goals of care discussed. Family agreed to intubation & 24 hrs support to see if Hypercarbia primary driving factor. Intubated. Had post intubation Hypotension. Got Volume resuscitation but ultimately required Norepinephrine infusion. Limited IV access required Left IJ CVL placement.Post intubation had complete opacification of left hemithorax. Drained left Pleural fluid collection and only got ~358ml. No sig improvement radiographically so proceeded w/ bronchoscopy and lavage w/ improved post lavage aeration on CXR. Patient waking up in afternoon and interacting w/ family. Arterial US obtained did not suggest evidence of sig LE arterial disease.  4/28: failed weaning Placed both pleur-x to sxn. Added precedex for agitation/anxiety.  4/29 extubated. Palliative care consulted.   SUBJECTIVE:  Awake. Follows commands. No distress on SBT VITAL SIGNS: Temp:  [98.7 F (37.1 C)-99.5 F (37.5 C)] 99.2 F (37.3 C) (04/29 0400) Pulse Rate:  [77-143] 97 (04/29 0700) Resp:  [12-34] 24 (04/29 0700) BP: (69-143)/(11-103) 143/97 (04/29 0700) SpO2:  [89 %-100 %] 100 % (04/29 0700) FiO2 (%):  [40 %-45 %] 40 % (04/29 0321) Weight:  [74.8 kg] 74.8 kg (04/29 0458)  PHYSICAL EXAMINATION:  General: This is a 70 year old white female she is currently on spontaneous breathing trial and in no physical distress HEENT orally intubated sclera nonicteric no JVD left IJ triple-lumen catheter dressing is unremarkable endotracheal tube in place orogastric tube in place Pulmonary: Diminished bilaterally with some scattered rhonchi occasionally.  She has  bilateral chest tubes/Pleurx tubes currently hooked to [place], I do not see air leak, she has significant pleural drainage from both chest tubes the right being much larger  volume than left Cardiac: Regular rate and rhythm without murmur rub or gallop Abdomen: Soft not tender colostomy is pink Extremities diffuse anasarca strong pulses capillary refill improved Neuro appropriate following commands moving all extremities  Resolved Problem list   Acute metabolic encephalopathy 2/2 hypercarbia  Severe sepsis ( pneumonia w/ NOS) Septic shock Lactic acidosis  Active problem list    Impression/plan Acute on chronic hypoxic and  hypercarbic respiratory failure. Multifactorial. Post-narcotic sedation and mucous plugging w/ atelectasis +/- PNA (NOS). Small Right apical trapped lung Superimposed on malignant bilateral effusions.  PCXR left > right airspace disease but overall better aerated. sm left effusion. Don't really see right effusion. Chest tubes good position. ett is high (needs to be advanced or removed), small apical lucency representing trapped lung.  Plan Spontaneous breathing trial with plan to extubate a little later this morning  BiPAP postextubation  Keep n.p.o. for now with the exception of meds  We will keep both [place] dressings to suction for now  Day # 5of 7 cefepime  Continue Precedex with plan to wean to off RASS goal is now 0  Continue pulmonary hygiene measures including incentive spirometry post extubation  Will need to be careful with her narcotic regimen post extubation as this was likely one of the larger contributing factors.  Drug related Hypotension Plan Cont norepi for SBP > 100 Suspect we can rapidly wean to off once off precedex    Leukocytosis ->continue to monitor  Plan Trend   h/o  DVT involving RLE (about 1 yr ago) & Peripheral arterial disease  Dopplers neg for occlusion Plan On systemic AC  DM w/ hyperglycemia Gluc w/in goal Plan Glucose goal  140-180  Stage IV breast cancer Plan Supportive care  Protein calorie malnutrition in the setting of underlying malignancy Plan NPO pending extubation  Prior colon cancer  Plan Colostomy care  Best Practice (right click and "Reselect all SmartList Selections" daily)   Diet:  NPO Pain/Anxiety/Delirium protocol (if indicated): Yes (RASS goal 0) VAP protocol (if indicated): Yes DVT prophylaxis: Other (comment) GI prophylaxis: PPI Glucose control:  SSI No   Central venous access:  Yes, and it is still needed Arterial line:  N/A Foley:  N/A Restraints ACTION; IS/IS NOT: is Restraint type: Soft restraint:  bilateral wrist Mobility:  bed rest  PT consulted: N/A Studies pending: none  Culture data pending: blood culture and pleural right and FOB Last reviewed culture data:today Antibiotics: cefepime Antibiotic de-escalation: Yes Stop date: has  4/30 Daily labs: requested Code Status:  DNR Prognosis: Life-threating. Consulted palliative  Last date of multidisciplinary goals of care discussion 4/27  Disposition: Continue present management     My cct 45 minutes.  This included 1/3 time review of medical records, 1/3 time direct face-to-face evaluation of the patient and bedside discussion w/ husband and wife 1/3 time developing assessment and plan  Erick Colace ACNP-BC [place] Pager # [phone] OR # [phone] if no answer

## 2021-02-03 NOTE — Progress Notes (Signed)
Offered support to Elizabeth [name]'s daughters and husband.  Her daughter, Elizabeth [name], was very angry about an encounter with a staff member and asked to speak to the charge nurse.  I spoke with Elizabeth [name] and Elizabeth [name] to let them know and provided listening presence to [facility].  Elizabeth [name] seemed somewhat confused and overwhelmed and I encouraged him to just focus on sitting beside Elizabeth [name] and offering her comfort.  Chaplain Elizabeth [name], Bcc Pager, [phone] 7:16 PM

## 2021-02-03 NOTE — Progress Notes (Signed)
Extubated WOB acceptable to the patient but is hypoxic and requiring escalating o2 support.   I spoke with the patient and family. I think she is hospice appropriate. We will have BIPAP for her but at this point she wants to just use the mask. I have told the patient and her family that I have asked palliative to see her. My hope is that we can manage her symptoms well enough to allow her to go home w/ hospice but I am concerned that she may not have the stamina for this.   Erick Colace ACNP-BC [place] Pager # [phone] OR # [phone] if no answer

## 2021-02-03 NOTE — Progress Notes (Signed)
Rapidly declined over the course of the day. I met again with her husband. She looks a little more comfortable but is now again requiring narcotics. She is now terminally ill and I expect that she will soon pass away.   At this point we will be transitioning to comfort care.  -I appreciate palliative care's assist.   Impression Acute on chronic hypoxic and hypercarbic respiratory failure Malignant (bilateral) pleural effusions Mucous plugging and R>L atelectasis Pneumonia  Severe muscular deconditioning 2/2 malignancy  Severe protein calorie malnutrition 2/2 malignancy  Acute metabolic encephalopathy 2/2 hypercarbia (this has re-occurred over the course of today after extubation) Stage VI breast cancer H/o colon cancer w/p colostomy  Drug related hypotension  Leukocytosis.  H/o DVT DM w/ hyperglycemia  Terminally ill  DNR status   Resolved issues Septic shock Lactic acidosis  Severe sepsis   Plan Transitioning to comfort care status  Have simplified medical administration orders Liberalize visitation Full DNR Start morphine gtt  I discussed this personally with the Pt's husband Elizabeth [name] who is slowly coming to terms with the situation.   Erick Colace ACNP-BC [place] Pager # [phone] OR # [phone] if no answer

## 2021-02-03 NOTE — Progress Notes (Signed)
70 year old with metastatic lung cancer and bilateral malignant effusion status post bilateral Pleurx catheter. PCCM consulted for increasing hypoxia and effusions in spite of drainage 4/27 she was transferred emergently to the ICU due to altered mental status in the form of decreased responsiveness , failed BiPAP and subsequently intubated   SIGNIFICANT EVENTS 4/26 bilateral Pleurx catheter drainage 4/27 ETT >> 4/27 LIJ >> 4/27 bronchoscopy -for left atelectasis, removal of mucoid secretions from left lung   Chest x-ray 4/29 independently reviewed which shows ET tube to be high, minimal right pneumothorax and small effusions. ET tube was advanced by anesthesia early this morning. Remains critically ill, intubated, bilateral decreased breath sounds, chest tubes draining sanguinous fluid to Pleur-evac, no air leak, soft nontender abdomen, 2+ bipedal edema, purple toes but capillary refill improved. ,  Pink colostomy  Labs show mild hyponatremia, stable renal function, increasing leukocytosis, mild hyperglycemia.  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  -She tolerated spontaneous breathing trials and we extubated her to oxygen.  She then desaturated and was placed on BiPAP.  Septic shock/HCAP -Continue Levophed, blood pressures do not seem to be accurate with cuff, but will defer A-line -BAL culture negative so far, continue cefepime for 7 days  Malignant effusions -continue Pleurx to Pleur-evac, once drainage decreases we will go back to alternate day drainage   Goals of care -discussed with husband and daughter in the room.  They agree to no reintubation , will involve palliative care.  In her current debilitated state, she is not a candidate for more chemotherapy  The patient is critically ill with multiple organ systems failure and requires high complexity decision making for  assessment and support, frequent evaluation and titration of therapies, application of advanced monitoring technologies and extensive interpretation of multiple databases. Critical Care Time devoted to patient care services described in this note independent of APP/resident  time is 35 minutes.   Leanna Sato Elsworth Soho MD

## 2021-02-03 NOTE — Progress Notes (Signed)
Contacted by radiologist : IMPRESSION: Endotracheal tube withdrawn, now seen at the thoracic inlet above the clavicular heads. Advancement by 4-5 cm may better position the Catheter. Relayed to Respiratory and Elink MD

## 2021-02-03 NOTE — Progress Notes (Signed)
[place] Progress Note Patient Name: Elizabeth [name] DOB: 09-18-1951 MRN: [id number]   Date of Service  02/03/2021  HPI/Events of Note  Asked to review AM CXR for ETT placement. ETT tip is 10 cm above the carina. I fear that the tip is near the vocal cords and should be advanced under direct visualization to be sure that it is advanced into the airway.  eICU Interventions  Plan: 1. PCCM ground team notified of findings. They request that anesthesia reintubate the patient. Respiratory therapy at [facility] notified and they will coordinate the procedure with nursing and anesthesia.     Intervention Category Major Interventions: Other:  Sylver Vantassell Cornelia Copa 02/03/2021, 6:12 AM

## 2021-02-03 NOTE — Procedures (Signed)
Extubation Procedure Note  Patient Details:   Name: Jamilah Jean DOB: [birth year] MRN: [id number]   Airway Documentation:  Airway 7.5 mm (Active)  Secured at (cm) 20 cm 02/03/21 0321  Measured From Lips 02/03/21 [place] 02/03/21 0321  Secured By [employer] 02/03/21 0321  Tube Holder Repositioned Yes 02/03/21 0321  Prone position No 02/03/21 0321  Cuff Pressure (cm H2O) MOV (Manual Technique) 02/02/21 2012  Site Condition Dry 02/03/21 0321   Vent end date: (not recorded) Vent end time: (not recorded)   Evaluation  O2 sats: 87 Complications: none Patient tolerated procedure well. Bilateral Breath Sounds: Diminished   Able to speak  Martha Clan 02/03/2021, 8:19 AM

## 2021-02-04 DIAGNOSIS — J9811 Atelectasis: Secondary | ICD-10-CM | POA: Diagnosis not present

## 2021-02-04 DIAGNOSIS — Z7189 Other specified counseling: Secondary | ICD-10-CM | POA: Diagnosis not present

## 2021-02-04 DIAGNOSIS — Z515 Encounter for palliative care: Secondary | ICD-10-CM | POA: Diagnosis not present

## 2021-02-04 DIAGNOSIS — J189 Pneumonia, unspecified organism: Secondary | ICD-10-CM | POA: Diagnosis not present

## 2021-02-04 DIAGNOSIS — J910 Malignant pleural effusion: Secondary | ICD-10-CM | POA: Diagnosis not present

## 2021-02-04 LAB — BODY FLUID CULTURE W GRAM STAIN
Culture: NO GROWTH
Special Requests: NORMAL

## 2021-02-04 MED ORDER — MORPHINE SULFATE (PF) 2 MG/ML IV SOLN: 1.0000 mg | INTRAVENOUS | Status: DC | PRN

## 2021-02-05 LAB — CULTURE, BAL-QUANTITATIVE W GRAM STAIN: Culture: 30000 — AB

## 2021-02-05 LAB — CULTURE, BLOOD (ROUTINE X 2): Culture: NO GROWTH

## 2021-02-05 NOTE — Progress Notes (Signed)
70 year old with metastatic lung cancer and bilateral malignant effusion status post bilateral Pleurx catheter. PCCM consulted for increasing hypoxia and effusions in spite of drainage 4/27 she was transferred emergently to the ICU due to altered mental status in the form of decreased responsiveness,failed BiPAP and subsequently intubated   SIGNIFICANT EVENTS 4/26 bilateral Pleurx catheter drainage 4/27 ETT>> 4/27 LIJ >> 4/27 bronchoscopy-for left atelectasis, removal of mucoid secretions from left lung 4/29 extubated , DNR issued  She had rapidly declined over the course of 4/29 and was transitioned to comfort care. Placed on morphine drip. On exam -nonrebreather, appears comfortable, agonal breathing, bilateral Pleurx catheter to [place]   Impression Acute on chronic hypoxic and hypercarbic respiratory failure Malignant (bilateral) pleural effusions Mucous plugging and R>L atelectasis Pneumonia  Severe muscular deconditioning 2/2 malignancy  Severe protein calorie malnutrition 2/2 malignancy  Acute metabolic encephalopathy 2/2 hypercarbia (this has re-occurred over the course of today after extubation) Stage VI breast cancer H/o colon cancer w/p colostomy  Drug related hypotension  Leukocytosis.  H/o DVT DM w/ hyperglycemia  Terminally ill  DNR status   Resolved issues Septic shock Lactic acidosis  Severe sepsis   Plan Full comfort care.  Family is coping Titrate morphine drip to comfort Can DC pleural VAC and cap Pleurx Transfer to floor  Erine Phenix V. Elsworth Soho MD

## 2021-02-05 NOTE — Progress Notes (Signed)
Daily Progress Note   Patient Name: Elizabeth [name]       Date: 26-Feb-2021 DOB: 09-27-1951  Age: 70 y.o. MRN#: [id number] Attending Physician: Rigoberto Noel, MD Primary Care Physician: Birdie Sons, MD Admit Date: 01/10/2021  Reason for Consultation/Follow-up: Terminal Care  Subjective:  actively dying, family husband and daughter at bedside. See below.   Length of Stay: 6  Current Medications: Scheduled Meds:  . chlorhexidine gluconate (MEDLINE KIT)  15 mL Mouth Rinse BID  . mouth rinse  15 mL Mouth Rinse 10 times per day    Continuous Infusions: . sodium chloride Stopped (02/03/21 1438)  . morphine 12 mg/hr (02/26/2021 0817)    PRN Meds: albuterol, albuterol, fentaNYL (SUBLIMAZE) injection, lidocaine-prilocaine, LORazepam, morphine injection, [DISCONTINUED] ondansetron **OR** ondansetron (ZOFRAN) IV, sodium chloride flush  Physical Exam         Unresponsive Some shallow agonal breathing and gasping Some peripheral cyanosis detected S 1 S 2  Has edema  Vital Signs: BP 110/86   Pulse (!) 135   Temp 97.7 F (36.5 C) (Axillary)   Resp 19   Ht _0  (1.575 m)   Wt 74.8 kg   SpO2 94%   BMI 30.16 kg/m  SpO2: SpO2: 94 % O2 Device: O2 Device: NRB,Other (Comment) ([place]) O2 Flow Rate: O2 Flow Rate (L/min): 25 L/min  Intake/output summary:   Intake/Output Summary (Last 24 hours) at 2021/02/26 1008 Last data filed at Feb 26, 2021 4166 Gross per 24 hour  Intake 1202.63 ml  Output --  Net 1202.63 ml   LBM: Last BM Date: 02/02/21 Baseline Weight: Weight: 75.6 kg Most recent weight: Weight: 74.8 kg      PPS 10% Palliative Assessment/Data:      Patient Active Problem List   Diagnosis Date Noted  . Pressure injury of skin 02/01/2021  . Atelectasis of  left lung   . Acute respiratory acidosis   . Cancer ([place])   . Malnutrition of moderate degree 01/31/2021  . HCAP (healthcare-associated pneumonia) 01/26/2021  . Cancer-related pain 05/28/2020  . Stage IV adenocarcinoma of lung ([place]) 05/25/2020  . Pleural effusion, malignant 05/18/2020  . Acute deep vein thrombosis (DVT) of distal end of right lower extremity ([place]) 05/18/2020  . Chronic bilateral pleural effusions   . Pulmonary embolism ([place])   . Respiratory failure ([place]) 05/12/2020  .  DOE (dyspnea on exertion) 04/18/2020  . Encounter for antineoplastic immunotherapy 03/31/2020  . Encounter for antineoplastic chemotherapy 01/12/2020  . Goals of care, counseling/discussion 01/12/2020  . Adenocarcinoma of right lung, stage 3 ([place]) 12/31/2019  . Mediastinal adenopathy 12/09/2019  . Right lower lobe pulmonary nodule 12/09/2019  . Elevated tumor markers 12/01/2019  . Localized osteoporosis without current pathological fracture 05/04/2019  . Malignant neoplasm of upper-outer quadrant of right female breast ([place]) 05/01/2019  . Age-related osteoporosis without current pathological fracture 05/01/2019  . Diverticulosis 12/05/2018  . GERD (gastroesophageal reflux disease) 01/30/2017  . Insomnia 01/30/2017  . Anxiety 09/07/2015  . Elevated WBC count 09/07/2015  . H/O total hip arthroplasty 09/07/2015  . HLD (hyperlipidemia) 09/07/2015  . Headache, migraine 09/07/2015  . Osteoarthritis 09/07/2015  . Arthropathy of temporomandibular joint 09/07/2015  . Olecranon bursitis 09/07/2015  . Major depressive disorder, single episode 09/07/2015  . Restless leg 07/08/2015  . Primary osteoarthritis of right hip 01/19/2015  . DJD (degenerative joint disease) 01/18/2015  . Left shoulder pain 10/05/2013    Palliative Care Assessment & Plan   Patient Profile:    Assessment:  Acute on chronic hypoxic and hypercarbic respiratory failure Malignant (bilateral) pleural effusions Mucous plugging and  R>L atelectasis Pneumonia  Severe muscular deconditioning 2/2 malignancy  Severe protein calorie malnutrition 2/2 malignancy  Acute metabolic encephalopathy 2/2 hypercarbia (this has re-occurred over the course of today after extubation) Stage VI breast cancer H/o colon cancer w/p colostomy  Drug related hypotension  Leukocytosis.  H/o DVT DM w/ hyperglycemia  Terminally ill  DNR status   Recommendations/Plan:   continue to uptitrate opioids for comfort measures  D/C medications and interventions not contributing directly to comfort.  Prognosis hours to some very limited number of days, anticipate hospital death, not stable enough to be transported to local hospice facility in my opinion.   Goals of Care and Additional Recommendations:  Limitations on Scope of Treatment: Full Comfort Care  Code Status:    Code Status Orders  (From admission, onward)         Start     Ordered   02/01/21 0927  Do not attempt resuscitation (DNR)  Continuous       Question:  Maintain current active treatments  Answer:  Yes   02/01/21 0926        Code Status History    Date Active Date Inactive Code Status Order ID Comments User Context   01/26/2021 2221 02/01/2021 0926 Full Code [id number]  Elwyn Reach, MD Inpatient   05/12/2020 1845 05/16/2020 2006 Full Code [id number]  Para Skeans, MD ED   01/18/2015 1056 01/19/2015 1533 Full Code [id number]  Lovett Calender, PA-C Inpatient   Advance Care Planning Activity       Prognosis:   Hours - Days  Discharge Planning:  Anticipated Hospital Death  Care plan was discussed with  Patient, husband, daughter and RN.   Thank you for allowing the Palliative Medicine Team to assist in the care of this patient.   Time In: 8 Time Out: 8.35 Total Time 35 Prolonged Time Billed No       Greater than 50%  of this time was spent counseling and coordinating care related to the above assessment and plan.  Loistine Chance, MD  Please contact  Palliative Medicine Team phone at [phone] for questions and concerns.

## 2021-02-05 NOTE — Progress Notes (Signed)
Wasted 98 ml morphine gtt (concentration 100 mg/153ml) w/ 2nd RN Constance Haw.  Also,during postmortem care we removed the right chest tube, IJ, and colostomy pouch, but were unable to remove the left chest tube. After postmortem care was performed, patient was transported to the [facility] at [facility], [place]

## 2021-02-05 NOTE — Progress Notes (Signed)
Family members called this RN into room.  Patient was unresponsive, with no respirations or pulse. Patient was examined by 2 RN's Myself and Elon Jester, RN.  Patient was pronounced dead at 2200 pm. Family was at bedside. Spouse at bedside. [facility] ([facility]), MD on call, Organ donation services were all notified. Family still at bedside at this time. Will prepare body/eyes and send patient to morgue once family leaves.Roderick Pee

## 2021-02-05 NOTE — Progress Notes (Incomplete)
Pallative care returned call and received report on patient due to parameters being met. Pallative gave okay to increase morphine drip to 12 from 10. 

## 2021-02-05 NOTE — Progress Notes (Signed)
[facility] Progress Note Patient Name: Elizabeth [name] DOB: Dec 25, 1950 MRN: [id number]   Date of Service  02/24/2021  HPI/Events of Note  Patient on comfort care.   eICU Interventions  Will D/C standing order for Norepinephrine iV infusion.      Intervention Category Major Interventions: Other:  Lysle Dingwall 02/24/21, 4:56 AM

## 2021-02-05 NOTE — Progress Notes (Signed)
Spiritual and emotional support offered by the on call chaplain. Chaplain offered prayer to family.  Family is catholic and request that any pastoral care be provided by a priest. Hospital Chaplain unable to provide a [place] at this time.  Spouse will contact priest for family support.

## 2021-02-05 NOTE — Progress Notes (Signed)
Air hungry, morphine bolus given , increased morphine to decrease struggles with breathing . Patient much more settle after rate increase

## 2021-02-05 DEATH — deceased

## 2021-03-03 LAB — FUNGAL ORGANISM REFLEX

## 2021-03-03 LAB — FUNGUS CULTURE WITH STAIN

## 2021-03-03 LAB — FUNGUS CULTURE RESULT

## 2021-03-08 NOTE — Death Summary Note (Signed)
70 year old with metastatic lung cancer and bilateral malignant effusion status post bilateral Pleurx catheter. PCCM consulted for increasing hypoxia and effusions in spite of drainage 4/27 she was transferred emergently to the ICU due to altered mental status in the form of decreased responsiveness,failed BiPAP and subsequently intubated     SIGNIFICANT EVENTS 4/26 bilateral Pleurx catheter drainage 4/27 ETT>> 4/27 [facility]J >> 4/27 bronchoscopy-for left atelectasis, removal of mucoid secretions from left lung 4/29 extubated , DNR issued   Impression Acute on chronic hypoxic and hypercarbic respiratory failure Malignant (bilateral) pleural effusions Mucous plugging and R>L atelectasis Pneumonia  Severe muscular deconditioning 2/2 malignancy  Severe protein calorie malnutrition 2/2 malignancy  Acute metabolic encephalopathy 2/2 hypercarbia (this has re-occurred over the course of today after extubation) Stage VI breast cancer H/o colon cancer w/p colostomy  Drug related hypotension  Leukocytosis.  H/o DVT DM w/ hyperglycemia  Terminally ill  DNR status   Resolved issues Septic shock Lactic acidosis  Severe sepsis   COURSE : She was intubated for less than 48 hours, pleural effusions were drained, bronchoscopy performed with removal of mucoid secretions from the left lung.  She improved to be able to wean and breathe spontaneously, she was extubated but then deteriorated rapidly requiring high flow oxygen.  She was then transition to comfort care with morphine drip based on goals of care discussion and passed away   Cause of death -acute hypoxic/hypercarbic respiratory failure, metastatic lung cancer, malignant pleural effusions, pneumonia  Madalene Mickler V. Elsworth Soho MD

## 2021-03-08 DEATH — deceased

## 2023-08-28 NOTE — Telephone Encounter (Signed)
Telephone call
